# Patient Record
Sex: Male | Born: 1960 | Race: White | Hispanic: No | Smoking: Never smoker
Health system: Southern US, Community
[De-identification: ages and names within clinical notes are randomized; demographics above are authoritative.]

## PROBLEM LIST (undated history)

## (undated) DIAGNOSIS — M1990 Unspecified osteoarthritis, unspecified site: Secondary | ICD-10-CM

## (undated) DIAGNOSIS — I10 Essential (primary) hypertension: Secondary | ICD-10-CM

## (undated) DIAGNOSIS — E119 Type 2 diabetes mellitus without complications: Secondary | ICD-10-CM

## (undated) DIAGNOSIS — S2249XA Multiple fractures of ribs, unspecified side, initial encounter for closed fracture: Secondary | ICD-10-CM

## (undated) DIAGNOSIS — M25519 Pain in unspecified shoulder: Secondary | ICD-10-CM

## (undated) DIAGNOSIS — I2510 Atherosclerotic heart disease of native coronary artery without angina pectoris: Secondary | ICD-10-CM

## (undated) DIAGNOSIS — K219 Gastro-esophageal reflux disease without esophagitis: Secondary | ICD-10-CM

## (undated) DIAGNOSIS — R300 Dysuria: Secondary | ICD-10-CM

## (undated) DIAGNOSIS — B351 Tinea unguium: Secondary | ICD-10-CM

## (undated) DIAGNOSIS — F32A Depression, unspecified: Secondary | ICD-10-CM

## (undated) DIAGNOSIS — N529 Male erectile dysfunction, unspecified: Secondary | ICD-10-CM

## (undated) DIAGNOSIS — G4700 Insomnia, unspecified: Secondary | ICD-10-CM

## (undated) DIAGNOSIS — E7800 Pure hypercholesterolemia, unspecified: Secondary | ICD-10-CM

## (undated) DIAGNOSIS — S2220XA Unspecified fracture of sternum, initial encounter for closed fracture: Secondary | ICD-10-CM

## (undated) DIAGNOSIS — N200 Calculus of kidney: Secondary | ICD-10-CM

## (undated) DIAGNOSIS — Z8371 Family history of colonic polyps: Secondary | ICD-10-CM

## (undated) DIAGNOSIS — Z8781 Personal history of (healed) traumatic fracture: Secondary | ICD-10-CM

## (undated) DIAGNOSIS — R079 Chest pain, unspecified: Secondary | ICD-10-CM

## (undated) DIAGNOSIS — Z83719 Family history of colon polyps, unspecified: Secondary | ICD-10-CM

## (undated) DIAGNOSIS — C61 Malignant neoplasm of prostate: Secondary | ICD-10-CM

## (undated) DIAGNOSIS — C4491 Basal cell carcinoma of skin, unspecified: Secondary | ICD-10-CM

## (undated) DIAGNOSIS — R001 Bradycardia, unspecified: Secondary | ICD-10-CM

## (undated) DIAGNOSIS — E785 Hyperlipidemia, unspecified: Secondary | ICD-10-CM

## (undated) DIAGNOSIS — F329 Major depressive disorder, single episode, unspecified: Secondary | ICD-10-CM

## (undated) DIAGNOSIS — H269 Unspecified cataract: Secondary | ICD-10-CM

## (undated) DIAGNOSIS — K529 Noninfective gastroenteritis and colitis, unspecified: Secondary | ICD-10-CM

## (undated) DIAGNOSIS — I440 Atrioventricular block, first degree: Secondary | ICD-10-CM

## (undated) DIAGNOSIS — K760 Fatty (change of) liver, not elsewhere classified: Secondary | ICD-10-CM

## (undated) DIAGNOSIS — M109 Gout, unspecified: Secondary | ICD-10-CM

## (undated) DIAGNOSIS — G4733 Obstructive sleep apnea (adult) (pediatric): Secondary | ICD-10-CM

## (undated) HISTORY — DX: Unspecified cataract: H26.9

## (undated) HISTORY — DX: Male erectile dysfunction, unspecified: N52.9

## (undated) HISTORY — DX: Chest pain, unspecified: R07.9

## (undated) HISTORY — DX: Malignant neoplasm of prostate: C61

## (undated) HISTORY — DX: Basal cell carcinoma of skin, unspecified: C44.91

## (undated) HISTORY — DX: Pain in unspecified shoulder: M25.519

## (undated) HISTORY — DX: Family history of colon polyps, unspecified: Z83.719

## (undated) HISTORY — DX: Depression, unspecified: F32.A

## (undated) HISTORY — DX: Unspecified osteoarthritis, unspecified site: M19.90

## (undated) HISTORY — DX: Insomnia, unspecified: G47.00

## (undated) HISTORY — DX: Hyperlipidemia, unspecified: E78.5

## (undated) HISTORY — PX: WRIST FRACTURE SURGERY: SHX121

## (undated) HISTORY — DX: Calculus of kidney: N20.0

## (undated) HISTORY — DX: Essential (primary) hypertension: I10

## (undated) HISTORY — DX: Noninfective gastroenteritis and colitis, unspecified: K52.9

## (undated) HISTORY — PX: APPENDECTOMY: SHX54

## (undated) HISTORY — DX: Tinea unguium: B35.1

## (undated) HISTORY — DX: Gastro-esophageal reflux disease without esophagitis: K21.9

## (undated) HISTORY — PX: HAND SURGERY: SHX662

## (undated) HISTORY — DX: Type 2 diabetes mellitus without complications: E11.9

## (undated) HISTORY — DX: Dysuria: R30.0

## (undated) HISTORY — PX: PROSTATECTOMY: SHX69

## (undated) HISTORY — DX: Family history of colonic polyps: Z83.71

## (undated) HISTORY — PX: FRACTURE SURGERY: SHX138

## (undated) HISTORY — DX: Major depressive disorder, single episode, unspecified: F32.9

---

## 1998-12-17 ENCOUNTER — Emergency Department (HOSPITAL_COMMUNITY): Admission: EM | Admit: 1998-12-17 | Discharge: 1998-12-17 | Payer: Self-pay | Admitting: Emergency Medicine

## 1998-12-17 ENCOUNTER — Encounter: Payer: Self-pay | Admitting: Emergency Medicine

## 1999-12-17 ENCOUNTER — Emergency Department (HOSPITAL_COMMUNITY): Admission: EM | Admit: 1999-12-17 | Discharge: 1999-12-17 | Payer: Self-pay | Admitting: *Deleted

## 2000-07-19 ENCOUNTER — Emergency Department (HOSPITAL_COMMUNITY): Admission: EM | Admit: 2000-07-19 | Discharge: 2000-07-19 | Payer: Self-pay | Admitting: Emergency Medicine

## 2000-07-19 ENCOUNTER — Encounter: Payer: Self-pay | Admitting: Emergency Medicine

## 2002-12-13 ENCOUNTER — Ambulatory Visit (HOSPITAL_COMMUNITY): Admission: RE | Admit: 2002-12-13 | Discharge: 2002-12-13 | Payer: Self-pay | Admitting: Gastroenterology

## 2005-05-22 ENCOUNTER — Ambulatory Visit (HOSPITAL_COMMUNITY): Payer: Self-pay | Admitting: Psychiatry

## 2005-08-08 ENCOUNTER — Ambulatory Visit (HOSPITAL_COMMUNITY): Payer: Self-pay | Admitting: Psychology

## 2012-10-18 ENCOUNTER — Encounter: Payer: Self-pay | Admitting: Cardiology

## 2012-10-18 ENCOUNTER — Encounter: Payer: Self-pay | Admitting: *Deleted

## 2012-10-18 DIAGNOSIS — N529 Male erectile dysfunction, unspecified: Secondary | ICD-10-CM | POA: Insufficient documentation

## 2012-10-18 DIAGNOSIS — R300 Dysuria: Secondary | ICD-10-CM | POA: Insufficient documentation

## 2012-10-18 DIAGNOSIS — C61 Malignant neoplasm of prostate: Secondary | ICD-10-CM | POA: Insufficient documentation

## 2012-10-18 DIAGNOSIS — K529 Noninfective gastroenteritis and colitis, unspecified: Secondary | ICD-10-CM | POA: Insufficient documentation

## 2012-10-18 DIAGNOSIS — N200 Calculus of kidney: Secondary | ICD-10-CM | POA: Insufficient documentation

## 2012-10-18 DIAGNOSIS — F32A Depression, unspecified: Secondary | ICD-10-CM | POA: Insufficient documentation

## 2012-10-18 DIAGNOSIS — B351 Tinea unguium: Secondary | ICD-10-CM | POA: Insufficient documentation

## 2012-10-18 DIAGNOSIS — G4700 Insomnia, unspecified: Secondary | ICD-10-CM | POA: Insufficient documentation

## 2012-10-18 DIAGNOSIS — C4491 Basal cell carcinoma of skin, unspecified: Secondary | ICD-10-CM | POA: Insufficient documentation

## 2012-10-18 DIAGNOSIS — R072 Precordial pain: Secondary | ICD-10-CM | POA: Insufficient documentation

## 2012-10-18 DIAGNOSIS — R079 Chest pain, unspecified: Secondary | ICD-10-CM | POA: Insufficient documentation

## 2012-10-18 DIAGNOSIS — F329 Major depressive disorder, single episode, unspecified: Secondary | ICD-10-CM | POA: Insufficient documentation

## 2012-10-18 DIAGNOSIS — K219 Gastro-esophageal reflux disease without esophagitis: Secondary | ICD-10-CM | POA: Insufficient documentation

## 2012-10-18 DIAGNOSIS — E785 Hyperlipidemia, unspecified: Secondary | ICD-10-CM | POA: Insufficient documentation

## 2012-10-18 DIAGNOSIS — M25519 Pain in unspecified shoulder: Secondary | ICD-10-CM | POA: Insufficient documentation

## 2012-10-19 ENCOUNTER — Ambulatory Visit (INDEPENDENT_AMBULATORY_CARE_PROVIDER_SITE_OTHER): Payer: 59 | Admitting: Cardiology

## 2012-10-19 ENCOUNTER — Encounter: Payer: Self-pay | Admitting: Cardiology

## 2012-10-19 VITALS — BP 122/67 | HR 65 | Ht 70.0 in | Wt 240.4 lb

## 2012-10-19 DIAGNOSIS — R079 Chest pain, unspecified: Secondary | ICD-10-CM

## 2012-10-19 NOTE — Progress Notes (Signed)
Patient ID: Cheo Selvey, male   DOB: July 21, 1960, 51 y.o.   MRN: [id number]    Patient Name: David Hartman Date of Encounter: 10/19/2012  Primary Care Provider:  No primary provider on file. Primary Cardiologist:  Tobias Alexander, H  Patient Profile  Chest pain  Problem List   Past Medical History  Diagnosis Date  . Chest pain   . Shoulder pain   . Depression   . Erectile dysfunction   . GERD (gastroesophageal reflux disease)   . Hyperlipidemia   . HTN (hypertension)   . Insomnia   . Nephrolithiasis   . Prostate cancer   . Basal cell carcinoma   . Dysuria   . Gastroenteritis   . Insomnia   . Onychomycosis    Past Surgical History  Procedure Laterality Date  . Prostatectomy      Allergies  No Known Allergies  HPI  52 years old male with h/o HTN, HLP previously followed by Dr. Rudean Haskell. He has a complete negative work up for chest pains about 10 years ago including echocardiogram and a stress test that were all normal. The patient describes a recent episode of a back pain followed by a chest pain that was reproducible. It was a sharp pain that lasted for 2 days. NTG gave him minimal relief. He recalls moving furniture couple of days prior to this event. He went to the ED where they ruled him out for ACS. The patient is anxious as he has a significant family history positive for CAD, both his father and grandfather died of MI. Two years ago he was treated for a prostate cancer.  Home Medications  Prior to Admission medications   Medication Sig Start Date End Date Taking? Authorizing Provider  ALPRAZolam Prudy Feeler) 1 MG tablet Take by mouth. Take 1/2 tab by mouth as needed for sleep   Yes Historical Provider, MD  atenolol (TENORMIN) 25 MG tablet Take 25 mg by mouth daily.   Yes Historical Provider, MD  naproxen (NAPROSYN) 500 MG tablet Take 500 mg by mouth as needed.    Yes Historical Provider, MD  omeprazole (PRILOSEC OTC) 20 MG tablet Take 20 mg by mouth daily.   Yes  Historical Provider, MD  simvastatin (ZOCOR) 40 MG tablet Take 40 mg by mouth every evening.   Yes Historical Provider, MD  triamterene-hydrochlorothiazide (DYAZIDE) 37.5-25 MG per capsule Take 1 capsule by mouth every morning.   Yes Historical Provider, MD    Family History  Father, grandfather - MI  Social History  History   Social History  . Marital Status: Divorced    Spouse Name: N/A    Number of Children: N/A  . Years of Education: N/A   Occupational History  . Not on file.   Social History Main Topics  . Smoking status: Never Smoker   . Smokeless tobacco: Not on file  . Alcohol Use: Not on file     Comment: Couple times a month  . Drug Use: No  . Sexual Activity: Not on file   Other Topics Concern  . Not on file   Social History Narrative  . No narrative on file     Review of Systems General:  No chills, fever, night sweats or weight changes.  Cardiovascular:  + chest pain, dyspnea on exertion, edema, orthopnea, palpitations, paroxysmal nocturnal dyspnea. Dermatological: No rash, lesions/masses Respiratory: No cough, dyspnea Urologic: No hematuria, dysuria Abdominal:   No nausea, vomiting, diarrhea, bright red blood per rectum, melena, or hematemesis Neurologic:  No visual changes, wkns, changes in mental status. All other systems reviewed and are otherwise negative except as noted above.  Physical Exam  Blood pressure 122/67, pulse 65, height 5\' 10"  (1.778 m), weight 240 lb 6.4 oz (109.045 kg).  General: Pleasant, NAD Psych: Normal affect. Neuro: Alert and oriented X 3. Moves all extremities spontaneously. HEENT: Normal  Neck: Supple without bruits or JVD. Lungs:  Resp regular and unlabored, CTA. Heart: RRR no s3, s4, or murmurs. Abdomen: Soft, non-tender, non-distended, BS + x 4.  Extremities: No clubbing, cyanosis or edema. DP/PT/Radials 2+ and equal bilaterally.  Accessory Clinical Findings  ECG - SR, 65 BPM, normal ECG   Assessment &  Plan  52 year old male   1. Chest pain - rather atypical, musculoskeletal, reproducible, however considering his age, risk factor including HTN, HLP and significant family history we will order an exercise stress echocardiogram to assess his baselien wall motion abnormality and possible ischemia.  2. Hypertension - well controlled  3. Hyperlipidemia - recently checked by his PCP,  The results are not available, the patient will fax [place] the results for review  We will cal the patient with the results  Tobias Alexander, Rexene Edison, MD 10/19/2012, 4:34 PM

## 2012-10-19 NOTE — Patient Instructions (Addendum)
**Note De-Identified Bailei Buist Obfuscation** Your physician has requested that you have a stress echocardiogram. For further information please visit [email or web address] Please follow instruction sheet as given.  Your physician has requested that you have an echocardiogram. Echocardiography is a painless test that uses sound waves to create images of your heart. It provides your doctor with information about the size and shape of your heart and how well your heart's chambers and valves are working. This procedure takes approximately one hour. There are no restrictions for this procedure.  Your physician recommends that you schedule a follow-up appointment in: 3 weeks

## 2012-11-10 ENCOUNTER — Other Ambulatory Visit (HOSPITAL_COMMUNITY): Payer: 59

## 2012-11-10 ENCOUNTER — Ambulatory Visit (HOSPITAL_BASED_OUTPATIENT_CLINIC_OR_DEPARTMENT_OTHER): Payer: 59

## 2012-11-10 ENCOUNTER — Ambulatory Visit (HOSPITAL_BASED_OUTPATIENT_CLINIC_OR_DEPARTMENT_OTHER): Payer: 59 | Admitting: Cardiology

## 2012-11-10 ENCOUNTER — Ambulatory Visit (HOSPITAL_COMMUNITY): Payer: 59 | Attending: Cardiology

## 2012-11-10 ENCOUNTER — Other Ambulatory Visit (HOSPITAL_COMMUNITY): Payer: Self-pay | Admitting: Cardiology

## 2012-11-10 DIAGNOSIS — R072 Precordial pain: Secondary | ICD-10-CM | POA: Insufficient documentation

## 2012-11-10 DIAGNOSIS — Z8249 Family history of ischemic heart disease and other diseases of the circulatory system: Secondary | ICD-10-CM | POA: Insufficient documentation

## 2012-11-10 DIAGNOSIS — C61 Malignant neoplasm of prostate: Secondary | ICD-10-CM | POA: Insufficient documentation

## 2012-11-10 DIAGNOSIS — R079 Chest pain, unspecified: Secondary | ICD-10-CM

## 2012-11-10 DIAGNOSIS — E785 Hyperlipidemia, unspecified: Secondary | ICD-10-CM | POA: Insufficient documentation

## 2012-11-10 DIAGNOSIS — R0989 Other specified symptoms and signs involving the circulatory and respiratory systems: Secondary | ICD-10-CM

## 2012-11-10 DIAGNOSIS — I10 Essential (primary) hypertension: Secondary | ICD-10-CM | POA: Insufficient documentation

## 2012-11-10 MED ORDER — PERFLUTREN PROTEIN A MICROSPH IV SUSP
2.0000 mL | Freq: Once | INTRAVENOUS | Status: AC
  Administered 2012-11-10: 2 mL via INTRAVENOUS

## 2012-11-10 NOTE — Progress Notes (Signed)
Stress echocardiogram performed. 

## 2012-11-10 NOTE — Progress Notes (Signed)
Echo performed. 

## 2012-11-12 ENCOUNTER — Ambulatory Visit: Payer: 59 | Admitting: Cardiology

## 2012-11-12 ENCOUNTER — Encounter: Payer: Self-pay | Admitting: Cardiology

## 2012-11-15 ENCOUNTER — Telehealth: Payer: Self-pay

## 2012-11-15 NOTE — Telephone Encounter (Signed)
New problem     Patient returning call back

## 2012-11-15 NOTE — Telephone Encounter (Signed)
Pt given results of his Echo and Stress Echo, he verbalized understanding.

## 2013-01-31 ENCOUNTER — Emergency Department (HOSPITAL_COMMUNITY)
Admission: EM | Admit: 2013-01-31 | Discharge: 2013-01-31 | Disposition: A | Discharge status: Home / Self Care | Payer: 59 | Attending: Emergency Medicine | Admitting: Emergency Medicine

## 2013-01-31 ENCOUNTER — Encounter (HOSPITAL_COMMUNITY): Payer: Self-pay | Admitting: Emergency Medicine

## 2013-01-31 DIAGNOSIS — Z8619 Personal history of other infectious and parasitic diseases: Secondary | ICD-10-CM | POA: Insufficient documentation

## 2013-01-31 DIAGNOSIS — F329 Major depressive disorder, single episode, unspecified: Secondary | ICD-10-CM | POA: Insufficient documentation

## 2013-01-31 DIAGNOSIS — Z87442 Personal history of urinary calculi: Secondary | ICD-10-CM | POA: Insufficient documentation

## 2013-01-31 DIAGNOSIS — K219 Gastro-esophageal reflux disease without esophagitis: Secondary | ICD-10-CM | POA: Insufficient documentation

## 2013-01-31 DIAGNOSIS — Z87448 Personal history of other diseases of urinary system: Secondary | ICD-10-CM | POA: Insufficient documentation

## 2013-01-31 DIAGNOSIS — M109 Gout, unspecified: Secondary | ICD-10-CM

## 2013-01-31 DIAGNOSIS — Z85828 Personal history of other malignant neoplasm of skin: Secondary | ICD-10-CM | POA: Insufficient documentation

## 2013-01-31 DIAGNOSIS — E785 Hyperlipidemia, unspecified: Secondary | ICD-10-CM | POA: Insufficient documentation

## 2013-01-31 DIAGNOSIS — M1A9XX1 Chronic gout, unspecified, with tophus (tophi): Secondary | ICD-10-CM | POA: Insufficient documentation

## 2013-01-31 DIAGNOSIS — F3289 Other specified depressive episodes: Secondary | ICD-10-CM | POA: Insufficient documentation

## 2013-01-31 DIAGNOSIS — I10 Essential (primary) hypertension: Secondary | ICD-10-CM | POA: Insufficient documentation

## 2013-01-31 DIAGNOSIS — Z79899 Other long term (current) drug therapy: Secondary | ICD-10-CM | POA: Insufficient documentation

## 2013-01-31 DIAGNOSIS — Z8546 Personal history of malignant neoplasm of prostate: Secondary | ICD-10-CM | POA: Insufficient documentation

## 2013-01-31 DIAGNOSIS — IMO0002 Reserved for concepts with insufficient information to code with codable children: Secondary | ICD-10-CM | POA: Insufficient documentation

## 2013-01-31 DIAGNOSIS — Z792 Long term (current) use of antibiotics: Secondary | ICD-10-CM | POA: Insufficient documentation

## 2013-01-31 LAB — BASIC METABOLIC PANEL
BUN: 19 mg/dL (ref 6–23)
CALCIUM: 9.2 mg/dL (ref 8.4–10.5)
CO2: 25 meq/L (ref 19–32)
CREATININE: 0.96 mg/dL (ref 0.50–1.35)
Chloride: 100 mEq/L (ref 96–112)
GFR calc non Af Amer: >90 mL/min (ref >90)
Glucose, Bld: 96 mg/dL (ref 70–99)
Potassium: 3.9 mEq/L (ref 3.7–5.3)
Sodium: 139 mEq/L (ref 137–147)

## 2013-01-31 LAB — URIC ACID: Uric Acid, Serum: 6.4 mg/dL (ref 4.0–7.8)

## 2013-01-31 MED ORDER — MORPHINE SULFATE 4 MG/ML IJ SOLN: 4.0000 mg | INTRAMUSCULAR | Status: DC | PRN

## 2013-01-31 MED ORDER — KETOROLAC TROMETHAMINE 30 MG/ML IJ SOLN
30.0000 mg | Freq: Once | INTRAMUSCULAR | Status: AC
  Administered 2013-01-31: 30 mg via INTRAVENOUS
  Filled 2013-01-31: qty 1

## 2013-01-31 MED ORDER — ONDANSETRON HCL 4 MG/2ML IJ SOLN
4.0000 mg | Freq: Once | INTRAMUSCULAR | Status: AC
Start: 2013-01-31 — End: 2013-01-31
  Administered 2013-01-31: 4 mg via INTRAVENOUS
  Filled 2013-01-31: qty 2

## 2013-01-31 MED ORDER — MORPHINE SULFATE 4 MG/ML IJ SOLN
4.0000 mg | INTRAMUSCULAR | Status: DC | PRN
  Administered 2013-01-31: 4 mg via INTRAVENOUS
  Filled 2013-01-31: qty 1

## 2013-01-31 MED ORDER — COLCHICINE 0.6 MG PO TABS
0.6000 mg | ORAL_TABLET | Freq: Once | ORAL | Status: AC
  Administered 2013-01-31: 0.6 mg via ORAL
  Filled 2013-01-31: qty 1

## 2013-01-31 MED ORDER — COLCHICINE 0.6 MG PO TABS: 0.6000 mg | ORAL_TABLET | Freq: Every day | ORAL | Status: DC

## 2013-01-31 MED ORDER — DEXAMETHASONE SODIUM PHOSPHATE 10 MG/ML IJ SOLN
10.0000 mg | Freq: Once | INTRAMUSCULAR | Status: AC
  Administered 2013-01-31: 10 mg via INTRAVENOUS
  Filled 2013-01-31: qty 1

## 2013-01-31 MED ORDER — PREDNISONE 10 MG PO TABS: 20.0000 mg | ORAL_TABLET | Freq: Every day | ORAL | Status: DC

## 2013-01-31 MED ORDER — OXYCODONE-ACETAMINOPHEN 10-325 MG PO TABS: 1.0000 | ORAL_TABLET | ORAL | Status: DC | PRN

## 2013-01-31 NOTE — ED Provider Notes (Signed)
CSN: [id number]     Arrival date & time 01/31/13  1032 History   First MD Initiated Contact with Patient 01/31/13 1240     Chief Complaint  Patient presents with  . Leg Swelling    HPI  [place] with painful swollen joints. 2 days ago he had some pain in his right hand and wrist. Painful to the point that it was uncomfortable to even move it. Seen at urgent care yesterday. Had labs obtained. Was given indomethacin, and Vicodin. States his pain has progressed despite this. He presents here. Symptoms have since involved to involve his left foot, and left knee. He has had a history of a knee effusion in the past and drained. This was after an injury. No other episodes of monoarthritis, or migratory arthritis. His father suffered with gout. He has never been diagnosed. He has small "bumps or swelling". The back of his elbow for several years.  No fevers. He has not fell ill. No nausea or vomiting. No rash. No urethritis or other infections.  Past Medical History  Diagnosis Date  . Chest pain   . Shoulder pain   . Depression   . Erectile dysfunction   . GERD (gastroesophageal reflux disease)   . Hyperlipidemia   . HTN (hypertension)   . Insomnia   . Nephrolithiasis   . Prostate cancer   . Basal cell carcinoma   . Dysuria   . Gastroenteritis   . Insomnia   . Onychomycosis    Past Surgical History  Procedure Laterality Date  . Prostatectomy     No family history on file. History  Substance Use Topics  . Smoking status: Never Smoker   . Smokeless tobacco: Not on file  . Alcohol Use: Not on file     Comment: "a beer once in a while"     Review of Systems  Constitutional: Negative for fever, chills, diaphoresis, appetite change and fatigue.  HENT: Negative for mouth sores, sore throat and trouble swallowing.   Eyes: Negative for visual disturbance.  Respiratory: Negative for cough, chest tightness, shortness of breath and wheezing.   Cardiovascular: Negative for chest pain.   Gastrointestinal: Negative for nausea, vomiting, abdominal pain, diarrhea and abdominal distention.  Endocrine: Negative for polydipsia, polyphagia and polyuria.  Genitourinary: Negative for dysuria, frequency and hematuria.  Musculoskeletal: Positive for arthralgias and joint swelling. Negative for gait problem.  Skin: Negative for color change, pallor and rash.  Neurological: Negative for dizziness, syncope, light-headedness and headaches.  Hematological: Does not bruise/bleed easily.  Psychiatric/Behavioral: Negative for behavioral problems and confusion.    Allergies  Review of patient's allergies indicates no known allergies.  Home Medications   Current Outpatient Rx  Name  Route  Sig  Dispense  Refill  . ALPRAZolam (XANAX) 1 MG tablet   Oral   Take 1 mg by mouth at bedtime as needed for sleep.          Marland Kitchen amoxicillin-clavulanate (AUGMENTIN) 875-125 MG per tablet   Oral   Take 1 tablet by mouth 2 (two) times daily.         Marland Kitchen atenolol (TENORMIN) 25 MG tablet   Oral   Take 25 mg by mouth daily.         . cyclobenzaprine (FLEXERIL) 10 MG tablet   Oral   Take 1 tablet by mouth 3 (three) times daily as needed for muscle spasms.          Marland Kitchen HYDROcodone-acetaminophen (NORCO/VICODIN) 5-325 MG per tablet  Oral   Take 1 tablet by mouth every 6 (six) hours as needed for moderate pain.          . indomethacin (INDOCIN) 50 MG capsule   Oral   Take 1 capsule by mouth 2 (two) times daily.         . naproxen (NAPROSYN) 500 MG tablet   Oral   Take 500 mg by mouth 2 (two) times daily as needed (knee pain).          Marland Kitchen omeprazole (PRILOSEC OTC) 20 MG tablet   Oral   Take 20 mg by mouth daily.         . simvastatin (ZOCOR) 40 MG tablet   Oral   Take 40 mg by mouth every evening.         . triamterene-hydrochlorothiazide (DYAZIDE) 37.5-25 MG per capsule   Oral   Take 1 capsule by mouth every morning.         . colchicine 0.6 MG tablet   Oral   Take 1  tablet (0.6 mg total) by mouth daily.   20 tablet   1   . oxyCODONE-acetaminophen (PERCOCET) 10-325 MG per tablet   Oral   Take 1 tablet by mouth every 4 (four) hours as needed for pain.   30 tablet   0   . predniSONE (DELTASONE) 10 MG tablet   Oral   Take 2 tablets (20 mg total) by mouth daily.   10 tablet   0    BP 125/65  Pulse 72  Temp(Src) 98 F (36.7 C) (Oral)  Resp 16  Ht 5\' 10"  (1.778 m)  Wt 237 lb (107.502 kg)  BMI 34.01 kg/m2  SpO2 97% Physical Exam  Constitutional: He is oriented to person, place, and time. He appears well-developed and well-nourished. No distress.  HENT:  Head: Normocephalic.  Eyes: Conjunctivae are normal. Pupils are equal, round, and reactive to light. No scleral icterus.  Neck: Normal range of motion. Neck supple. No thyromegaly present.  Cardiovascular: Normal rate and regular rhythm.  Exam reveals no gallop and no friction rub.   No murmur heard. Pulmonary/Chest: Effort normal and breath sounds normal. No respiratory distress. He has no wheezes. He has no rales.  Abdominal: Soft. Bowel sounds are normal. He exhibits no distension. There is no tenderness. There is no rebound.  Musculoskeletal: Normal range of motion.  He is soft tissue swelling of the dorsum of his wrist and hand. Both are painful to range of motion. Good perfusion distally. No pain at the elbow. 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 His left knee is painful but has not had effusion. He has soft tissue swelling to the dorsum of the left foot. No erythema. No warmth. No ankle effusion.  Neurological: He is alert and oriented to person, place, and time.  Skin: Skin is warm and dry. No rash noted.  No rash or petechiae  Psychiatric: He has a normal mood and affect. His behavior is normal.    ED Course  Procedures (including critical care time) Labs Review Labs Reviewed  BASIC METABOLIC PANEL  URIC ACID   Imaging Review No  results found.  EKG Interpretation   None       MDM   1. Gout    Visual the discharged on anti-inflammatories, steroids, colchicine, pain medications.    Tanna Furry, MD 02/02/13 [phone]

## 2013-01-31 NOTE — Discharge Instructions (Signed)
Stop hydrochlorothiazide. Talk with your Dr. Regarding any substitute medications. Talk to your doctor about allopurinol to your episode is resolved.  Gout Gout is when your joints become red, sore, and swell (inflammed). This is caused by the buildup of uric acid crystals in the joints. Uric acid is a chemical that is normally in the blood. If the level of uric acid gets too high in the blood, these crystals form in your joints and tissues. Over time, these crystals can form into masses near the joints and tissues. These masses can destroy bone and cause the bone to look misshapen (deformed). HOME CARE   Do not take aspirin for pain.  Only take medicine as told by your doctor.  Rest the joint as much as you can. When in bed, keep sheets and blankets off painful areas.  Keep the sore joints raised (elevated).  Put warm or cold packs on painful joints. Use of warm or cold packs depends on which works best for you.  Use crutches if the painful joint is in your leg.  Drink enough fluids to keep your pee (urine) clear or pale yellow. Limit alcohol, sugary drinks, and drinks with fructose in them.  Follow your diet instructions. Pay careful attention to how much protein you eat. Include fruits, vegetables, whole grains, and fat-free or low-fat milk products in your daily diet. Talk to your doctor or dietician about the use of coffee, vitamin C, and cherries. These may help lower uric acid levels.  Keep a healthy body weight. GET HELP RIGHT AWAY IF:   You have watery poop (diarrhea), throw up (vomit), or have any side effects from medicines.  You do not feel better in 24 hours, or you are getting worse.  Your joint becomes suddenly more tender, and you have chills or a fever. MAKE SURE YOU:   Understand these instructions.  Will watch your condition.  Will get help right away if you are not doing well or get worse. Document Released: 10/16/2007 Document Revised: 05/03/2012 Document  Reviewed: 04/16/2009 [facility] Patient Information 2014 [place].

## 2013-01-31 NOTE — ED Notes (Signed)
Pt with left foot and left knee pain since Friday, Thursday with right hand swelling and pain, seen Urgent Care and dx with Gout yesterday and was given prescription for Norco and gout med, pt states this morning woke with with right elbow pain as well, states Norco does not touch his pain and has taken three doses of gout med

## 2013-01-31 NOTE — ED Notes (Signed)
Pt states left foot was swollen yesterday and was seen yesterday at urgent care. No previous hx of gout. Was placed on gout medication and blood was drawn but no results as of yet. Woke up this morning with left knee also swollen, along with right elbow. Pt refused wheelchair on arrival but had difficulty ambulating.

## 2013-01-31 NOTE — ED Notes (Signed)
Good pedal pulses and radial pulses noted bilaterally

## 2013-06-01 ENCOUNTER — Encounter (HOSPITAL_COMMUNITY): Payer: Self-pay | Admitting: Emergency Medicine

## 2013-06-01 DIAGNOSIS — Z8619 Personal history of other infectious and parasitic diseases: Secondary | ICD-10-CM | POA: Insufficient documentation

## 2013-06-01 DIAGNOSIS — K219 Gastro-esophageal reflux disease without esophagitis: Secondary | ICD-10-CM | POA: Insufficient documentation

## 2013-06-01 DIAGNOSIS — I10 Essential (primary) hypertension: Secondary | ICD-10-CM | POA: Insufficient documentation

## 2013-06-01 DIAGNOSIS — E785 Hyperlipidemia, unspecified: Secondary | ICD-10-CM | POA: Insufficient documentation

## 2013-06-01 DIAGNOSIS — Z79899 Other long term (current) drug therapy: Secondary | ICD-10-CM | POA: Insufficient documentation

## 2013-06-01 DIAGNOSIS — N200 Calculus of kidney: Secondary | ICD-10-CM | POA: Insufficient documentation

## 2013-06-01 DIAGNOSIS — IMO0002 Reserved for concepts with insufficient information to code with codable children: Secondary | ICD-10-CM | POA: Insufficient documentation

## 2013-06-01 DIAGNOSIS — F329 Major depressive disorder, single episode, unspecified: Secondary | ICD-10-CM | POA: Insufficient documentation

## 2013-06-01 DIAGNOSIS — R42 Dizziness and giddiness: Secondary | ICD-10-CM | POA: Insufficient documentation

## 2013-06-01 DIAGNOSIS — F3289 Other specified depressive episodes: Secondary | ICD-10-CM | POA: Insufficient documentation

## 2013-06-01 DIAGNOSIS — Z8546 Personal history of malignant neoplasm of prostate: Secondary | ICD-10-CM | POA: Insufficient documentation

## 2013-06-01 DIAGNOSIS — Z791 Long term (current) use of non-steroidal anti-inflammatories (NSAID): Secondary | ICD-10-CM | POA: Insufficient documentation

## 2013-06-01 NOTE — ED Notes (Signed)
Pain in left flank and into left groin for several hours, nausea, no vomiting.  Hx of kidney stones

## 2013-06-02 ENCOUNTER — Emergency Department (HOSPITAL_COMMUNITY): Payer: 59

## 2013-06-02 ENCOUNTER — Emergency Department (HOSPITAL_COMMUNITY)
Admission: EM | Admit: 2013-06-02 | Discharge: 2013-06-02 | Disposition: A | Discharge status: Home / Self Care | Payer: 59 | Attending: Emergency Medicine | Admitting: Emergency Medicine

## 2013-06-02 DIAGNOSIS — N200 Calculus of kidney: Secondary | ICD-10-CM

## 2013-06-02 LAB — URINE MICROSCOPIC-ADD ON

## 2013-06-02 LAB — URINALYSIS, ROUTINE W REFLEX MICROSCOPIC
BILIRUBIN URINE: NEGATIVE
Glucose, UA: NEGATIVE mg/dL
KETONES UR: NEGATIVE mg/dL
Leukocytes, UA: NEGATIVE
NITRITE: NEGATIVE
PH: 6 (ref 5.0–8.0)
Protein, ur: NEGATIVE mg/dL
Specific Gravity, Urine: <1.005 — ABNORMAL LOW (ref 1.005–1.030)
Urobilinogen, UA: 0.2 mg/dL (ref 0.0–1.0)

## 2013-06-02 MED ORDER — OXYCODONE-ACETAMINOPHEN 5-325 MG PO TABS: 2.0000 | ORAL_TABLET | ORAL | Status: DC | PRN

## 2013-06-02 MED ORDER — SODIUM CHLORIDE 0.9 % IV BOLUS (SEPSIS)
500.0000 mL | Freq: Once | INTRAVENOUS | Status: AC
  Administered 2013-06-02: 500 mL via INTRAVENOUS

## 2013-06-02 MED ORDER — KETOROLAC TROMETHAMINE 30 MG/ML IJ SOLN
30.0000 mg | Freq: Once | INTRAMUSCULAR | Status: AC
  Administered 2013-06-02: 30 mg via INTRAVENOUS
  Filled 2013-06-02: qty 1

## 2013-06-02 MED ORDER — ONDANSETRON HCL 4 MG/2ML IJ SOLN
4.0000 mg | Freq: Once | INTRAMUSCULAR | Status: AC
  Administered 2013-06-02: 4 mg via INTRAVENOUS
  Filled 2013-06-02: qty 2

## 2013-06-02 MED ORDER — HYDROMORPHONE HCL PF 1 MG/ML IJ SOLN
1.0000 mg | Freq: Once | INTRAMUSCULAR | Status: AC
  Administered 2013-06-02: 1 mg via INTRAVENOUS
  Filled 2013-06-02: qty 1

## 2013-06-02 MED ORDER — PROMETHAZINE HCL 25 MG PO TABS: 25.0000 mg | ORAL_TABLET | Freq: Four times a day (QID) | ORAL | Status: DC | PRN

## 2013-06-02 NOTE — Discharge Instructions (Signed)
Kidney Stones Kidney stones (urolithiasis) are solid masses that form inside your kidneys. The intense pain is caused by the stone moving through the kidney, ureter, bladder, and urethra (urinary tract). When the stone moves, the ureter starts to spasm around the stone. The stone is usually passed in your pee (urine).  HOME CARE  Drink enough fluids to keep your pee clear or pale yellow. This helps to get the stone out.  Strain all pee through the provided strainer. Do not pee without peeing through the strainer, not even once. If you pee the stone out, catch it in the strainer. The stone may be as small as a grain of salt. Take this to your doctor. This will help your doctor figure out what you can do to try to prevent more kidney stones.  Only take medicine as told by your doctor.  Follow up with your doctor as told.  Get follow-up X-rays as told by your doctor. GET HELP IF: You have pain that gets worse even if you have been taking pain medicine. GET HELP RIGHT AWAY IF:   Your pain does not get better with medicine.  You have a fever or shaking chills.  Your pain increases and gets worse over 18 hours.  You have new belly (abdominal) pain.  You feel faint or pass out.  You are unable to pee. MAKE SURE YOU:   Understand these instructions.  Will watch your condition.  Will get help right away if you are not doing well or get worse. Document Released: 06/25/2007 Document Revised: 09/08/2012 Document Reviewed: 06/09/2012 [facility] Patient Information 2014 [place], [place].   Medication for pain and nausea. Followup with urologist if not getting better.

## 2013-06-02 NOTE — ED Provider Notes (Signed)
CSN: [id number]     Arrival date & time 06/01/13  2326 History  This chart was scribed for Nat Christen, MD by Ladene Artist, ED Scribe. The patient was seen in room APA10/APA10. Patient's care was started at 12:33 AM.    Chief Complaint  Patient presents with  . Flank Pain    The history is provided by the patient. No language interpreter was used.   HPI Comments: David Hartman is a 53 y.o. male who presents to the Emergency Department complaining of sudden sharp L lateral flank pain that radiates to L groin onset 4 PM today. He reports light-headedness. He denies back pain, chest pain, dysuria, fever, hematuria. Pt has a h/o kidney stones. Pt has has approximately 9 kidney stone episodes in the past. Severity is moderate.  He is in remission for prostate cancer.   PCP:  Saunders Glance  Past Medical History  Diagnosis Date  . Chest pain   . Shoulder pain   . Depression   . Erectile dysfunction   . GERD (gastroesophageal reflux disease)   . Hyperlipidemia   . HTN (hypertension)   . Insomnia   . Nephrolithiasis   . Prostate cancer   . Basal cell carcinoma   . Dysuria   . Gastroenteritis   . Insomnia   . Onychomycosis    Past Surgical History  Procedure Laterality Date  . Prostatectomy     No family history on file. History  Substance Use Topics  . Smoking status: Never Smoker   . Smokeless tobacco: Not on file  . Alcohol Use: No     Comment: "a beer once in a while"     Review of Systems  Cardiovascular: Negative for chest pain.  Genitourinary: Positive for flank pain.  Musculoskeletal: Negative for back pain.  Neurological: Positive for light-headedness.  All other systems reviewed and are negative.   Allergies  Review of patient's allergies indicates no known allergies.  Home Medications   Prior to Admission medications   Medication Sig Start Date End Date Taking? Authorizing Provider  ALPRAZolam Duanne Moron) 1 MG tablet Take 1 mg by mouth at bedtime as needed  for sleep.    Yes Historical Provider, MD  atenolol (TENORMIN) 25 MG tablet Take 25 mg by mouth daily.   Yes Historical Provider, MD  omeprazole (PRILOSEC OTC) 20 MG tablet Take 20 mg by mouth daily.   Yes Historical Provider, MD  simvastatin (ZOCOR) 40 MG tablet Take 40 mg by mouth every evening.   Yes Historical Provider, MD  amoxicillin-clavulanate (AUGMENTIN) 875-125 MG per tablet Take 1 tablet by mouth 2 (two) times daily. 01/22/13   Historical Provider, MD  colchicine 0.6 MG tablet Take 1 tablet (0.6 mg total) by mouth daily. 01/31/13   Tanna Furry, MD  cyclobenzaprine (FLEXERIL) 10 MG tablet Take 1 tablet by mouth 3 (three) times daily as needed for muscle spasms.  01/28/13   Historical Provider, MD  HYDROcodone-acetaminophen (NORCO/VICODIN) 5-325 MG per tablet Take 1 tablet by mouth every 6 (six) hours as needed for moderate pain.  01/30/13   Historical Provider, MD  indomethacin (INDOCIN) 50 MG capsule Take 1 capsule by mouth 2 (two) times daily. 01/30/13   Historical Provider, MD  naproxen (NAPROSYN) 500 MG tablet Take 500 mg by mouth 2 (two) times daily as needed (knee pain).     Historical Provider, MD  oxyCODONE-acetaminophen (PERCOCET) 10-325 MG per tablet Take 1 tablet by mouth every 4 (four) hours as needed for pain. 01/31/13  Tanna Furry, MD  predniSONE (DELTASONE) 10 MG tablet Take 2 tablets (20 mg total) by mouth daily. 01/31/13   Tanna Furry, MD  triamterene-hydrochlorothiazide (DYAZIDE) 37.5-25 MG per capsule Take 1 capsule by mouth every morning.    Historical Provider, MD   Triage Vitals: BP 163/93  Pulse 79  Temp(Src) 97.9 F (36.6 C) (Oral)  Resp 16  Ht 5\' 10"  (1.778 m)  Wt 238 lb (107.956 kg)  BMI 34.15 kg/m2  SpO2 100% Physical Exam  Nursing note and vitals reviewed. Constitutional: He is oriented to person, place, and time. He appears well-developed and well-nourished.  HENT:  Head: Normocephalic and atraumatic.  Eyes: Conjunctivae and EOM are normal. Pupils are equal,  round, and reactive to light.  Neck: Normal range of motion. Neck supple.  Cardiovascular: Normal rate, regular rhythm and normal heart sounds.   Pulmonary/Chest: Effort normal and breath sounds normal.  Abdominal: Soft. Bowel sounds are normal.  Musculoskeletal: Normal range of motion.  L lateral flank minimal tenderness  LLQ tenderness   Neurological: He is alert and oriented to person, place, and time.  Skin: Skin is warm and dry.  Psychiatric: He has a normal mood and affect. His behavior is normal.    ED Course  Procedures (including critical care time) DIAGNOSTIC STUDIES: Oxygen Saturation is 100% on RA, normal by my interpretation.    COORDINATION OF CARE: 12:38 AM Discussed treatment plan with pt at bedside which includes UA and KUB and pt agreed to plan.  Labs Review Labs Reviewed  URINALYSIS, ROUTINE W REFLEX MICROSCOPIC - Abnormal; Notable for the following:    Specific Gravity, Urine <1.005 (*)    Hgb urine dipstick MODERATE (*)    All other components within normal limits  URINE MICROSCOPIC-ADD ON    Imaging Review Dg Abd 1 View  06/02/2013   CLINICAL DATA:  Lower quadrant pain  EXAM: ABDOMEN - 1 VIEW  COMPARISON:  None.  FINDINGS: The bowel gas pattern is nonspecific without evidence of obstruction or ileus. No abnormal bowel wall thickening identified. No free intraperitoneal air. No soft tissue mass. A 5 mm calcific density overlying the left renal shadow is suspicious for a left renal calculus. No other abnormal calcifications identified.  Mild degenerative changes noted within the visualized spine. No acute osseous abnormality.  IMPRESSION: 1. Nonobstructive bowel gas pattern with no radiographic evidence of acute intra-abdominal process. 2. 5 mm calcification overlying the left renal shadow, suspicious for a left renal calculus.   Electronically Signed   By: Jeannine Boga M.D.   On: 06/02/2013 01:17     EKG Interpretation None      MDM   Final  diagnoses:  Kidney stone on left side    History and physical consistent with left-sided kidney stone. Patient is pain-free after pain management. Urinalysis shows hemoglobin. Plain x-ray of abdomen does not show any obvious ureteral stone. Discharge medications Percocet and Phenergan 25 mg. Patient has a urologist  I personally performed the services described in this documentation, which was scribed in my presence. The recorded information has been reviewed and is accurate.    Nat Christen, MD 06/02/13 [phone]

## 2013-09-18 ENCOUNTER — Emergency Department (HOSPITAL_COMMUNITY)
Admission: EM | Admit: 2013-09-18 | Discharge: 2013-09-18 | Disposition: A | Discharge status: Home / Self Care | Payer: 59 | Attending: Emergency Medicine | Admitting: Emergency Medicine

## 2013-09-18 ENCOUNTER — Encounter (HOSPITAL_COMMUNITY): Payer: Self-pay | Admitting: Emergency Medicine

## 2013-09-18 ENCOUNTER — Emergency Department (HOSPITAL_COMMUNITY): Payer: 59

## 2013-09-18 DIAGNOSIS — Z8619 Personal history of other infectious and parasitic diseases: Secondary | ICD-10-CM | POA: Diagnosis not present

## 2013-09-18 DIAGNOSIS — E785 Hyperlipidemia, unspecified: Secondary | ICD-10-CM | POA: Diagnosis not present

## 2013-09-18 DIAGNOSIS — Z85828 Personal history of other malignant neoplasm of skin: Secondary | ICD-10-CM | POA: Insufficient documentation

## 2013-09-18 DIAGNOSIS — I10 Essential (primary) hypertension: Secondary | ICD-10-CM | POA: Insufficient documentation

## 2013-09-18 DIAGNOSIS — Z79899 Other long term (current) drug therapy: Secondary | ICD-10-CM | POA: Insufficient documentation

## 2013-09-18 DIAGNOSIS — R109 Unspecified abdominal pain: Secondary | ICD-10-CM | POA: Insufficient documentation

## 2013-09-18 DIAGNOSIS — Z8546 Personal history of malignant neoplasm of prostate: Secondary | ICD-10-CM | POA: Insufficient documentation

## 2013-09-18 DIAGNOSIS — K219 Gastro-esophageal reflux disease without esophagitis: Secondary | ICD-10-CM | POA: Diagnosis not present

## 2013-09-18 DIAGNOSIS — F3289 Other specified depressive episodes: Secondary | ICD-10-CM | POA: Insufficient documentation

## 2013-09-18 DIAGNOSIS — N23 Unspecified renal colic: Secondary | ICD-10-CM | POA: Diagnosis not present

## 2013-09-18 DIAGNOSIS — F329 Major depressive disorder, single episode, unspecified: Secondary | ICD-10-CM | POA: Diagnosis not present

## 2013-09-18 DIAGNOSIS — Z9089 Acquired absence of other organs: Secondary | ICD-10-CM | POA: Insufficient documentation

## 2013-09-18 LAB — COMPREHENSIVE METABOLIC PANEL
ALK PHOS: 76 U/L (ref 39–117)
ALT: 25 U/L (ref 0–53)
ANION GAP: 12 (ref 5–15)
AST: 23 U/L (ref 0–37)
Albumin: 4.2 g/dL (ref 3.5–5.2)
BILIRUBIN TOTAL: 0.4 mg/dL (ref 0.3–1.2)
BUN: 20 mg/dL (ref 6–23)
CHLORIDE: 100 meq/L (ref 96–112)
CO2: 25 mEq/L (ref 19–32)
Calcium: 9.4 mg/dL (ref 8.4–10.5)
Creatinine, Ser: 1.21 mg/dL (ref 0.50–1.35)
GFR calc non Af Amer: 67 mL/min — ABNORMAL LOW (ref >90)
GFR, EST AFRICAN AMERICAN: 78 mL/min — AB (ref >90)
GLUCOSE: 136 mg/dL — AB (ref 70–99)
POTASSIUM: 4.8 meq/L (ref 3.7–5.3)
Sodium: 137 mEq/L (ref 137–147)
Total Protein: 7.5 g/dL (ref 6.0–8.3)

## 2013-09-18 LAB — CBC WITH DIFFERENTIAL/PLATELET
Basophils Absolute: 0 10*3/uL (ref 0.0–0.1)
Basophils Relative: 0 % (ref 0–1)
EOS ABS: 0.1 10*3/uL (ref 0.0–0.7)
Eosinophils Relative: 1 % (ref 0–5)
HCT: 41.5 % (ref 39.0–52.0)
HEMOGLOBIN: 14.3 g/dL (ref 13.0–17.0)
Lymphocytes Relative: 7 % — ABNORMAL LOW (ref 12–46)
Lymphs Abs: 0.8 10*3/uL (ref 0.7–4.0)
MCH: 30.6 pg (ref 26.0–34.0)
MCHC: 34.5 g/dL (ref 30.0–36.0)
MCV: 88.9 fL (ref 78.0–100.0)
MONOS PCT: 3 % (ref 3–12)
Monocytes Absolute: 0.4 10*3/uL (ref 0.1–1.0)
NEUTROS ABS: 11.3 10*3/uL — AB (ref 1.7–7.7)
NEUTROS PCT: 89 % — AB (ref 43–77)
Platelets: 254 10*3/uL (ref 150–400)
RBC: 4.67 MIL/uL (ref 4.22–5.81)
RDW: 12.7 % (ref 11.5–15.5)
WBC: 12.6 10*3/uL — ABNORMAL HIGH (ref 4.0–10.5)

## 2013-09-18 LAB — URINALYSIS, ROUTINE W REFLEX MICROSCOPIC
BILIRUBIN URINE: NEGATIVE
Glucose, UA: NEGATIVE mg/dL
KETONES UR: NEGATIVE mg/dL
Leukocytes, UA: NEGATIVE
NITRITE: NEGATIVE
Protein, ur: NEGATIVE mg/dL
Specific Gravity, Urine: 1.025 (ref 1.005–1.030)
UROBILINOGEN UA: 0.2 mg/dL (ref 0.0–1.0)
pH: 5.5 (ref 5.0–8.0)

## 2013-09-18 LAB — URINE MICROSCOPIC-ADD ON

## 2013-09-18 MED ORDER — KETOROLAC TROMETHAMINE 30 MG/ML IJ SOLN
INTRAMUSCULAR | Status: AC
  Filled 2013-09-18: qty 1

## 2013-09-18 MED ORDER — TAMSULOSIN HCL 0.4 MG PO CAPS: 0.4000 mg | ORAL_CAPSULE | Freq: Every day | ORAL | Status: DC

## 2013-09-18 MED ORDER — OXYCODONE-ACETAMINOPHEN 5-325 MG PO TABS: 2.0000 | ORAL_TABLET | ORAL | Status: DC | PRN

## 2013-09-18 MED ORDER — HYDROMORPHONE HCL PF 1 MG/ML IJ SOLN
INTRAMUSCULAR | Status: AC
  Filled 2013-09-18: qty 1

## 2013-09-18 MED ORDER — ONDANSETRON HCL 4 MG/2ML IJ SOLN
INTRAMUSCULAR | Status: AC
  Filled 2013-09-18: qty 2

## 2013-09-18 MED ORDER — KETOROLAC TROMETHAMINE 30 MG/ML IJ SOLN
30.0000 mg | Freq: Once | INTRAMUSCULAR | Status: AC
  Administered 2013-09-18: 30 mg via INTRAVENOUS

## 2013-09-18 MED ORDER — ONDANSETRON HCL 4 MG/2ML IJ SOLN
4.0000 mg | Freq: Once | INTRAMUSCULAR | Status: AC
  Administered 2013-09-18: 4 mg via INTRAVENOUS

## 2013-09-18 MED ORDER — IBUPROFEN 800 MG PO TABS: 800.0000 mg | ORAL_TABLET | Freq: Three times a day (TID) | ORAL | Status: DC

## 2013-09-18 MED ORDER — SODIUM CHLORIDE 0.9 % IV BOLUS (SEPSIS)
1000.0000 mL | Freq: Once | INTRAVENOUS | Status: AC
  Administered 2013-09-18: 1000 mL via INTRAVENOUS

## 2013-09-18 MED ORDER — OXYCODONE-ACETAMINOPHEN 5-325 MG PO TABS
2.0000 | ORAL_TABLET | Freq: Once | ORAL | Status: AC
  Administered 2013-09-18: 2 via ORAL
  Filled 2013-09-18: qty 2

## 2013-09-18 MED ORDER — ONDANSETRON HCL 4 MG PO TABS: 4.0000 mg | ORAL_TABLET | Freq: Four times a day (QID) | ORAL | Status: DC

## 2013-09-18 MED ORDER — HYDROMORPHONE HCL PF 1 MG/ML IJ SOLN
1.0000 mg | Freq: Once | INTRAMUSCULAR | Status: AC
  Administered 2013-09-18: 1 mg via INTRAVENOUS

## 2013-09-18 MED ORDER — HYDROMORPHONE HCL PF 1 MG/ML IJ SOLN
1.0000 mg | Freq: Once | INTRAMUSCULAR | Status: AC
  Administered 2013-09-18: 1 mg via INTRAVENOUS
  Filled 2013-09-18: qty 1

## 2013-09-18 NOTE — ED Notes (Signed)
Pt reports left flank pain, n/v, difficulty urinating since 830 this am. Pt tearful and doubled over in triage. Pt reports kidney stone history.

## 2013-09-18 NOTE — Discharge Instructions (Signed)
Kidney Stones You need a followup CAT scan in 6 months to reevaluate the cysts in pelvis. Followup with the urologist. Return to the ED if you develop new or worsening symptoms. Kidney stones (urolithiasis) are deposits that form inside your kidneys. The intense pain is caused by the stone moving through the urinary tract. When the stone moves, the ureter goes into spasm around the stone. The stone is usually passed in the urine.  CAUSES   A disorder that makes certain neck glands produce too much parathyroid hormone (primary hyperparathyroidism).  A buildup of uric acid crystals, similar to gout in your joints.  Narrowing (stricture) of the ureter.  A kidney obstruction present at birth (congenital obstruction).  Previous surgery on the kidney or ureters.  Numerous kidney infections. SYMPTOMS   Feeling sick to your stomach (nauseous).  Throwing up (vomiting).  Blood in the urine (hematuria).  Pain that usually spreads (radiates) to the groin.  Frequency or urgency of urination. DIAGNOSIS   Taking a history and physical exam.  Blood or urine tests.  CT scan.  Occasionally, an examination of the inside of the urinary bladder (cystoscopy) is performed. TREATMENT   Observation.  Increasing your fluid intake.  Extracorporeal shock wave lithotripsy--This is a noninvasive procedure that uses shock waves to break up kidney stones.  Surgery may be needed if you have severe pain or persistent obstruction. There are various surgical procedures. Most of the procedures are performed with the use of small instruments. Only small incisions are needed to accommodate these instruments, so recovery time is minimized. The size, location, and chemical composition are all important variables that will determine the proper choice of action for you. Talk to your health care provider to better understand your situation so that you will minimize the risk of injury to yourself and your kidney.    HOME CARE INSTRUCTIONS   Drink enough water and fluids to keep your urine clear or pale yellow. This will help you to pass the stone or stone fragments.  Strain all urine through the provided strainer. Keep all particulate matter and stones for your health care provider to see. The stone causing the pain may be as small as a grain of salt. It is very important to use the strainer each and every time you pass your urine. The collection of your stone will allow your health care provider to analyze it and verify that a stone has actually passed. The stone analysis will often identify what you can do to reduce the incidence of recurrences.  Only take over-the-counter or prescription medicines for pain, discomfort, or fever as directed by your health care provider.  Make a follow-up appointment with your health care provider as directed.  Get follow-up X-rays if required. The absence of pain does not always mean that the stone has passed. It may have only stopped moving. If the urine remains completely obstructed, it can cause loss of kidney function or even complete destruction of the kidney. It is your responsibility to make sure X-rays and follow-ups are completed. Ultrasounds of the kidney can show blockages and the status of the kidney. Ultrasounds are not associated with any radiation and can be performed easily in a matter of minutes. SEEK MEDICAL CARE IF:  You experience pain that is progressive and unresponsive to any pain medicine you have been prescribed. SEEK IMMEDIATE MEDICAL CARE IF:   Pain cannot be controlled with the prescribed medicine.  You have a fever or shaking chills.  The severity  or intensity of pain increases over 18 hours and is not relieved by pain medicine.  You develop a new onset of abdominal pain.  You feel faint or pass out.  You are unable to urinate. MAKE SURE YOU:   Understand these instructions.  Will watch your condition.  Will get help right away  if you are not doing well or get worse. Document Released: 01/06/2005 Document Revised: 09/08/2012 Document Reviewed: 06/09/2012 [facility] Patient Information 2015 [place], [place]. This information is not intended to replace advice given to you by your health care provider. Make sure you discuss any questions you have with your health care provider.

## 2013-09-18 NOTE — ED Provider Notes (Signed)
CSN: [id number]     Arrival date & time 09/18/13  1036 History  This chart was scribed for David Essex, MD by Starleen Arms, ED Scribe. This patient was seen in room APA08/APA08 and the patient's care was started at 10:54 AM.   Chief Complaint  Patient presents with  . Flank Pain   HPI  HPI Comments: David Hartman is a 53 y.o. male with a history of nephrolithiasis who presents to the Emergency Department complaining of severe, sudden, unchanged right flank pain onset 8:30 this morning with associated diaphoresis, two episodes of emesis, and dysuria.  Patient states that this episode feels similar to his past episodes of nephrolithiases.  Patient denies hematuria.   Past Medical History  Diagnosis Date  . Chest pain   . Shoulder pain   . Depression   . Erectile dysfunction   . GERD (gastroesophageal reflux disease)   . Hyperlipidemia   . HTN (hypertension)   . Insomnia   . Nephrolithiasis   . Prostate cancer   . Basal cell carcinoma   . Dysuria   . Gastroenteritis   . Insomnia   . Onychomycosis    Past Surgical History  Procedure Laterality Date  . Prostatectomy     History reviewed. No pertinent family history. History  Substance Use Topics  . Smoking status: Never Smoker   . Smokeless tobacco: Not on file  . Alcohol Use: No     Comment: "a beer once in a while"     Review of Systems  A complete 10 system review of systems was obtained and all systems are negative except as noted in the HPI and PMH.   Allergies  Review of patient's allergies indicates no known allergies.  Home Medications   Prior to Admission medications   Medication Sig Start Date End Date Taking? Authorizing Provider  allopurinol (ZYLOPRIM) 300 MG tablet Take 300 mg by mouth daily.   Yes Historical Provider, MD  atenolol (TENORMIN) 25 MG tablet Take 25 mg by mouth daily.   Yes Historical Provider, MD  colchicine 0.6 MG tablet Take 1 tablet (0.6 mg total) by mouth daily. 01/31/13  Yes  Tanna Furry, MD  lisinopril (PRINIVIL,ZESTRIL) 10 MG tablet Take 10 mg by mouth daily.   Yes Historical Provider, MD  omeprazole (PRILOSEC OTC) 20 MG tablet Take 20 mg by mouth daily.   Yes Historical Provider, MD  oxyCODONE-acetaminophen (PERCOCET) 5-325 MG per tablet Take 2 tablets by mouth every 4 (four) hours as needed. 06/02/13  Yes Nat Christen, MD  simvastatin (ZOCOR) 40 MG tablet Take 40 mg by mouth every evening.   Yes Historical Provider, MD  ibuprofen (ADVIL,MOTRIN) 800 MG tablet Take 1 tablet (800 mg total) by mouth 3 (three) times daily. 09/18/13   David Essex, MD  ondansetron (ZOFRAN) 4 MG tablet Take 1 tablet (4 mg total) by mouth every 6 (six) hours. 09/18/13   David Essex, MD  oxyCODONE-acetaminophen (PERCOCET/ROXICET) 5-325 MG per tablet Take 2 tablets by mouth every 4 (four) hours as needed for severe pain. 09/18/13   David Essex, MD  tamsulosin (FLOMAX) 0.4 MG CAPS capsule Take 1 capsule (0.4 mg total) by mouth daily. 09/18/13   David Essex, MD   BP 135/76  Pulse 60  Temp(Src) 97.5 F (36.4 C) (Oral)  Resp 12  Ht 5\' 10"  (1.778 m)  Wt 234 lb (106.142 kg)  BMI 33.58 kg/m2  SpO2 99% Physical Exam  Nursing note and vitals reviewed. Constitutional: He is oriented to person,  place, and time. He appears well-developed and well-nourished. No distress.  Uncomfortable.    HENT:  Head: Normocephalic and atraumatic.  Mouth/Throat: Oropharynx is clear and moist. No oropharyngeal exudate.  Eyes: Conjunctivae and EOM are normal. Pupils are equal, round, and reactive to light.  Neck: Normal range of motion. Neck supple.  No meningismus.  Cardiovascular: Normal rate, regular rhythm, normal heart sounds and intact distal pulses.   No murmur heard. Pulmonary/Chest: Effort normal and breath sounds normal. No respiratory distress.  Abdominal: Soft. There is tenderness. There is no rebound and no guarding.  Right-sided lower abdominal tenderness.   Genitourinary:  No CVA  tenderness.  No testicular tenderness.  Musculoskeletal: Normal range of motion. He exhibits no edema and no tenderness.  Neurological: He is alert and oriented to person, place, and time. No cranial nerve deficit. He exhibits normal muscle tone. Coordination normal.  No ataxia on finger to nose bilaterally. No pronator drift. 5/5 strength throughout. CN 2-12 intact. Negative Romberg. Equal grip strength. Sensation intact. Gait is normal.   Skin: Skin is warm.  Psychiatric: He has a normal mood and affect. His behavior is normal.    ED Course  Procedures (including critical care time)  COORDINATION OF CARE:  10:57 AM will order pain medication and antiemetics.  Patient acknowledges and agrees with plan.    1:28 PM Upon recheck, patient's pain has improved.  Informed patient that stone has likely migrated to his bladder and discussed plan to let patient pass kidney stone independently and discharge patient home.  Will prescribe pain medication.   Labs Review Labs Reviewed  CBC WITH DIFFERENTIAL - Abnormal; Notable for the following:    WBC 12.6 (*)    Neutrophils Relative % 89 (*)    Neutro Abs 11.3 (*)    Lymphocytes Relative 7 (*)    All other components within normal limits  COMPREHENSIVE METABOLIC PANEL - Abnormal; Notable for the following:    Glucose, Bld 136 (*)    GFR calc non Af Amer 67 (*)    GFR calc Af Amer 78 (*)    All other components within normal limits  URINALYSIS, ROUTINE W REFLEX MICROSCOPIC - Abnormal; Notable for the following:    Hgb urine dipstick SMALL (*)    All other components within normal limits  URINE MICROSCOPIC-ADD ON    Imaging Review Ct Abdomen Pelvis Wo Contrast  09/18/2013   CLINICAL DATA:  53 year old male with right flank and abdominal pain. History urinary calculi.  EXAM: CT ABDOMEN AND PELVIS WITHOUT CONTRAST  TECHNIQUE: Multidetector CT imaging of the abdomen and pelvis was performed following the standard protocol without IV contrast.   COMPARISON:  06/02/2013 radiographs  FINDINGS: A 2 mm right UVJ calculus causes mild to moderate right hydroureteronephrosis and right perinephric inflammation.  At least 3 punctate nonobstructing left renal calculi are identified.  Mild fatty infiltration of the liver is identified.  The spleen, adrenal glands, pancreas, and gallbladder are unremarkable.  Please note that parenchymal abnormalities may be missed without intravenous contrast.  There is no evidence of free fluid, enlarged lymph nodes, biliary dilation or abdominal aortic aneurysm.  High density structures/calcifications within the appendix noted without evidence of appendicitis.  There is no evidence of bowel obstruction, bowel wall thickening, pneumoperitoneum or suspicious collection.  A 3.8 x 4.5 x 7.2 cm unilocular appearing cyst in lateral left pelvis contains faint rim calcification and is not adjacent to bowel. This probably represents a benign process such as a prior  hematoma, lymphocele or duplication cyst.  No acute or suspicious bony abnormalities are identified. Mild degenerative disc disease and spondylosis at L5-S1 noted.  IMPRESSION: 2 mm right UVJ calculus causing mild to moderate right hydroureteronephrosis.  Punctate nonobstructing left renal calculi.  Mild fatty infiltration of the liver.  3.8 x 4.5 x 7.2 cm likely benign unilocular appearing left pelvic cystic structure.   Electronically Signed   By: Hassan Rowan M.D.   On: 09/18/2013 12:29     EKG Interpretation None      MDM   Final diagnoses:  Renal colic   Constant right flank and lower abdominal pain with nausea and vomiting at 8:30 AM. Similar to previous kidney stones. Denies dysuria hematuria.  Pain control with IV fluids, antibiotics, narcotics.  UA shows hematuria without infection. CT scan shows 2 mm right UVJ calculus with mild hydronephrosis. There is a cystic pelvic structure is favored to be benign.  Pain improved in the ED. Patient tolerating by  mouth. Patient informed of cystic mass in pelvis.  Hx prostate cancer.  D/w Dr. Melanee Spry who favors benign process and recommends follow up in 6 months.  Tolerating PO in the ED.  Will treat pain, given antiemetics, flomax.  Followup with urology.  Follow up with PCP regarding cystic mass.  BP 135/76  Pulse 60  Temp(Src) 97.5 F (36.4 C) (Oral)  Resp 12  Ht 5\' 10"  (1.778 m)  Wt 234 lb (106.142 kg)  BMI 33.58 kg/m2  SpO2 99%    I personally performed the services described in this documentation, which was scribed in my presence. The recorded information has been reviewed and is accurate.   David Essex, MD 09/18/13 [phone]

## 2014-01-26 ENCOUNTER — Emergency Department (HOSPITAL_COMMUNITY): Payer: 59

## 2014-01-26 ENCOUNTER — Emergency Department (HOSPITAL_COMMUNITY)
Admission: EM | Admit: 2014-01-26 | Discharge: 2014-01-26 | Disposition: A | Discharge status: Home / Self Care | Payer: 59 | Attending: Emergency Medicine | Admitting: Emergency Medicine

## 2014-01-26 ENCOUNTER — Encounter (HOSPITAL_COMMUNITY): Payer: Self-pay | Admitting: *Deleted

## 2014-01-26 DIAGNOSIS — N529 Male erectile dysfunction, unspecified: Secondary | ICD-10-CM | POA: Diagnosis not present

## 2014-01-26 DIAGNOSIS — Z8619 Personal history of other infectious and parasitic diseases: Secondary | ICD-10-CM | POA: Diagnosis not present

## 2014-01-26 DIAGNOSIS — I10 Essential (primary) hypertension: Secondary | ICD-10-CM | POA: Insufficient documentation

## 2014-01-26 DIAGNOSIS — M109 Gout, unspecified: Secondary | ICD-10-CM | POA: Diagnosis not present

## 2014-01-26 DIAGNOSIS — R109 Unspecified abdominal pain: Secondary | ICD-10-CM

## 2014-01-26 DIAGNOSIS — E785 Hyperlipidemia, unspecified: Secondary | ICD-10-CM | POA: Diagnosis not present

## 2014-01-26 DIAGNOSIS — F329 Major depressive disorder, single episode, unspecified: Secondary | ICD-10-CM | POA: Insufficient documentation

## 2014-01-26 DIAGNOSIS — Z8669 Personal history of other diseases of the nervous system and sense organs: Secondary | ICD-10-CM | POA: Diagnosis not present

## 2014-01-26 DIAGNOSIS — N201 Calculus of ureter: Secondary | ICD-10-CM

## 2014-01-26 DIAGNOSIS — E780 Pure hypercholesterolemia: Secondary | ICD-10-CM | POA: Diagnosis not present

## 2014-01-26 DIAGNOSIS — K219 Gastro-esophageal reflux disease without esophagitis: Secondary | ICD-10-CM | POA: Diagnosis not present

## 2014-01-26 DIAGNOSIS — Z8546 Personal history of malignant neoplasm of prostate: Secondary | ICD-10-CM | POA: Diagnosis not present

## 2014-01-26 DIAGNOSIS — Z79899 Other long term (current) drug therapy: Secondary | ICD-10-CM | POA: Insufficient documentation

## 2014-01-26 DIAGNOSIS — Z85828 Personal history of other malignant neoplasm of skin: Secondary | ICD-10-CM | POA: Insufficient documentation

## 2014-01-26 HISTORY — DX: Pure hypercholesterolemia, unspecified: E78.00

## 2014-01-26 HISTORY — DX: Gout, unspecified: M10.9

## 2014-01-26 LAB — URINALYSIS, ROUTINE W REFLEX MICROSCOPIC
Bilirubin Urine: NEGATIVE
GLUCOSE, UA: NEGATIVE mg/dL
Ketones, ur: NEGATIVE mg/dL
Leukocytes, UA: NEGATIVE
NITRITE: NEGATIVE
PH: 5.5 (ref 5.0–8.0)
Protein, ur: NEGATIVE mg/dL
Specific Gravity, Urine: 1.01 (ref 1.005–1.030)
Urobilinogen, UA: 0.2 mg/dL (ref 0.0–1.0)

## 2014-01-26 LAB — CBC WITH DIFFERENTIAL/PLATELET
BASOS ABS: 0 10*3/uL (ref 0.0–0.1)
BASOS PCT: 0 % (ref 0–1)
Eosinophils Absolute: 0.1 10*3/uL (ref 0.0–0.7)
Eosinophils Relative: 1 % (ref 0–5)
HEMATOCRIT: 41.7 % (ref 39.0–52.0)
Hemoglobin: 13.8 g/dL (ref 13.0–17.0)
LYMPHS PCT: 7 % — AB (ref 12–46)
Lymphs Abs: 0.8 10*3/uL (ref 0.7–4.0)
MCH: 29.8 pg (ref 26.0–34.0)
MCHC: 33.1 g/dL (ref 30.0–36.0)
MCV: 90.1 fL (ref 78.0–100.0)
Monocytes Absolute: 0.5 10*3/uL (ref 0.1–1.0)
Monocytes Relative: 4 % (ref 3–12)
NEUTROS ABS: 10.8 10*3/uL — AB (ref 1.7–7.7)
Neutrophils Relative %: 88 % — ABNORMAL HIGH (ref 43–77)
Platelets: 272 10*3/uL (ref 150–400)
RBC: 4.63 MIL/uL (ref 4.22–5.81)
RDW: 12.8 % (ref 11.5–15.5)
WBC: 12.3 10*3/uL — AB (ref 4.0–10.5)

## 2014-01-26 LAB — BASIC METABOLIC PANEL
ANION GAP: 6 (ref 5–15)
BUN: 17 mg/dL (ref 6–23)
CHLORIDE: 104 meq/L (ref 96–112)
CO2: 24 mmol/L (ref 19–32)
Calcium: 8.9 mg/dL (ref 8.4–10.5)
Creatinine, Ser: 1.07 mg/dL (ref 0.50–1.35)
GFR, EST NON AFRICAN AMERICAN: 78 mL/min — AB (ref >90)
Glucose, Bld: 105 mg/dL — ABNORMAL HIGH (ref 70–99)
POTASSIUM: 4.2 mmol/L (ref 3.5–5.1)
SODIUM: 134 mmol/L — AB (ref 135–145)

## 2014-01-26 LAB — URINE MICROSCOPIC-ADD ON

## 2014-01-26 MED ORDER — SODIUM CHLORIDE 0.9 % IV SOLN: INTRAVENOUS | Status: DC

## 2014-01-26 MED ORDER — HYDROMORPHONE HCL 1 MG/ML IJ SOLN
1.0000 mg | Freq: Once | INTRAMUSCULAR | Status: AC
  Administered 2014-01-26: 1 mg via INTRAVENOUS
  Filled 2014-01-26: qty 1

## 2014-01-26 MED ORDER — ONDANSETRON 4 MG PO TBDP: 4.0000 mg | ORAL_TABLET | Freq: Three times a day (TID) | ORAL | Status: DC | PRN

## 2014-01-26 MED ORDER — SODIUM CHLORIDE 0.9 % IV BOLUS (SEPSIS)
1000.0000 mL | Freq: Once | INTRAVENOUS | Status: AC
  Administered 2014-01-26: 1000 mL via INTRAVENOUS

## 2014-01-26 MED ORDER — TAMSULOSIN HCL 0.4 MG PO CAPS: 0.4000 mg | ORAL_CAPSULE | Freq: Every day | ORAL | Status: DC

## 2014-01-26 MED ORDER — OXYCODONE-ACETAMINOPHEN 5-325 MG PO TABS: 1.0000 | ORAL_TABLET | Freq: Four times a day (QID) | ORAL | Status: DC | PRN

## 2014-01-26 MED ORDER — ONDANSETRON HCL 4 MG/2ML IJ SOLN
4.0000 mg | Freq: Once | INTRAMUSCULAR | Status: AC
  Administered 2014-01-26: 4 mg via INTRAVENOUS
  Filled 2014-01-26: qty 2

## 2014-01-26 NOTE — ED Notes (Signed)
Pt with bilateral flank pain since 1230, pt believes it's kidney stones, hx of kidney stone, + nausea but denies vomiting

## 2014-01-26 NOTE — ED Provider Notes (Signed)
CSN: [id number]     Arrival date & time 01/26/14  1442 History  This chart was scribed for Fredia Sorrow, MD by Edison Simon, ED Scribe. This patient was seen in room APA12/APA12 and the patient's care was started at 3:21 PM.    Chief Complaint  Patient presents with  . Flank Pain   Patient is a 54 y.o. male presenting with flank pain. The history is provided by the patient. No language interpreter was used.  Flank Pain This is a recurrent problem. The current episode started 1 to 2 hours ago. The problem occurs constantly. The problem has been gradually improving. Pertinent negatives include no chest pain, no abdominal pain, no headaches and no shortness of breath. Nothing aggravates the symptoms. Nothing relieves the symptoms. He has tried nothing for the symptoms.    HPI Comments: David Hartman is a 54 y.o. male with history of kidney stones who presents to the Emergency Department complaining of bilateral low back pain and left flank since 1230 today. He states it started suddenly on the left side and is worse on the left side. He states it feels just like prior kidney stone pain. He rates it at 5-6/10 now and at 10/10 at its worst. He states his last kidney stone was in August. He does not have a urologist. He reports associated nausea earlier with cold sweats, but denies vomiting.  PCP: Tawni Carnes, PA-C with [facility] in [place]  Past Medical History  Diagnosis Date  . Chest pain   . Shoulder pain   . Depression   . Erectile dysfunction   . GERD (gastroesophageal reflux disease)   . Hyperlipidemia   . HTN (hypertension)   . Insomnia   . Nephrolithiasis   . Dysuria   . Gastroenteritis   . Insomnia   . Onychomycosis   . Prostate cancer   . Basal cell carcinoma   . High cholesterol   . Gout    Past Surgical History  Procedure Laterality Date  . Prostatectomy     History reviewed. No pertinent family history. History  Substance Use Topics  . Smoking status: Never  Smoker   . Smokeless tobacco: Not on file  . Alcohol Use: No     Comment: "a beer once in a while"     Review of Systems  Constitutional: Positive for chills and diaphoresis. Negative for fever.  HENT: Negative for rhinorrhea and sore throat.   Eyes: Negative for visual disturbance.  Respiratory: Negative for cough and shortness of breath.   Cardiovascular: Negative for chest pain and leg swelling.  Gastrointestinal: Positive for nausea. Negative for vomiting, abdominal pain and diarrhea.  Genitourinary: Positive for dysuria and flank pain. Negative for hematuria.  Musculoskeletal: Positive for back pain.  Skin: Negative for rash.  Neurological: Negative for headaches.  Hematological: Does not bruise/bleed easily.  Psychiatric/Behavioral: Negative for confusion.      Allergies  Review of patient's allergies indicates no known allergies.  Home Medications   Prior to Admission medications   Medication Sig Start Date End Date Taking? Authorizing Provider  allopurinol (ZYLOPRIM) 300 MG tablet Take 300 mg by mouth daily.   Yes Historical Provider, MD  ALPRAZolam Duanne Moron) 1 MG tablet Take 1 mg by mouth daily as needed for anxiety.  12/07/13  Yes Historical Provider, MD  atenolol (TENORMIN) 25 MG tablet Take 25 mg by mouth daily.   Yes Historical Provider, MD  lisinopril (PRINIVIL,ZESTRIL) 10 MG tablet Take 10 mg by mouth daily.   Yes  Historical Provider, MD  omeprazole (PRILOSEC OTC) 20 MG tablet Take 20 mg by mouth daily.   Yes Historical Provider, MD  simvastatin (ZOCOR) 40 MG tablet Take 40 mg by mouth every evening.   Yes Historical Provider, MD  colchicine 0.6 MG tablet Take 1 tablet (0.6 mg total) by mouth daily. Patient not taking: Reported on 01/26/2014 01/31/13   Tanna Furry, MD  ibuprofen (ADVIL,MOTRIN) 800 MG tablet Take 1 tablet (800 mg total) by mouth 3 (three) times daily. Patient not taking: Reported on 01/26/2014 09/18/13   Ezequiel Essex, MD  ondansetron (ZOFRAN ODT) 4 MG  disintegrating tablet Take 1 tablet (4 mg total) by mouth every 8 (eight) hours as needed. 01/26/14   Fredia Sorrow, MD  ondansetron (ZOFRAN) 4 MG tablet Take 1 tablet (4 mg total) by mouth every 6 (six) hours. Patient not taking: Reported on 01/26/2014 09/18/13   Ezequiel Essex, MD  oxyCODONE-acetaminophen (PERCOCET) 5-325 MG per tablet Take 2 tablets by mouth every 4 (four) hours as needed. Patient not taking: Reported on 01/26/2014 06/02/13   Nat Christen, MD  oxyCODONE-acetaminophen (PERCOCET/ROXICET) 5-325 MG per tablet Take 2 tablets by mouth every 4 (four) hours as needed for severe pain. Patient not taking: Reported on 01/26/2014 09/18/13   Ezequiel Essex, MD  oxyCODONE-acetaminophen (PERCOCET/ROXICET) 5-325 MG per tablet Take 1-2 tablets by mouth every 6 (six) hours as needed for moderate pain or severe pain. 01/26/14   Fredia Sorrow, MD  tamsulosin (FLOMAX) 0.4 MG CAPS capsule Take 1 capsule (0.4 mg total) by mouth daily. Patient not taking: Reported on 01/26/2014 09/18/13   Ezequiel Essex, MD  tamsulosin (FLOMAX) 0.4 MG CAPS capsule Take 1 capsule (0.4 mg total) by mouth daily. 01/26/14   Fredia Sorrow, MD   BP 146/74 mmHg  Pulse 65  Temp(Src) 97.8 F (36.6 C) (Oral)  Resp 18  Ht 5\' 10"  (1.778 m)  Wt 246 lb (111.585 kg)  BMI 35.30 kg/m2  SpO2 100% Physical Exam  Constitutional: He is oriented to person, place, and time. He appears well-developed and well-nourished.  HENT:  Head: Normocephalic and atraumatic.  Mucous membranes moist  Eyes: Conjunctivae and EOM are normal.  Sclera clear  Neck: Normal range of motion. Neck supple.  Pulmonary/Chest: Effort normal.  Musculoskeletal: Normal range of motion.  Neurological: He is alert and oriented to person, place, and time. No cranial nerve deficit. He exhibits normal muscle tone. Coordination normal.  Skin: Skin is warm and dry.  Psychiatric: He has a normal mood and affect.  Nursing note and vitals reviewed.   ED Course  Procedures  (including critical care time)  DIAGNOSTIC STUDIES: Oxygen Saturation is 100% on room air, normal by my interpretation.    COORDINATION OF CARE: 3:26 PM Discussed treatment plan with patient at beside, the patient agrees with the plan and has no further questions at this time.   Labs Review Labs Reviewed  CBC WITH DIFFERENTIAL - Abnormal; Notable for the following:    WBC 12.3 (*)    Neutrophils Relative % 88 (*)    Neutro Abs 10.8 (*)    Lymphocytes Relative 7 (*)    All other components within normal limits  BASIC METABOLIC PANEL - Abnormal; Notable for the following:    Sodium 134 (*)    Glucose, Bld 105 (*)    GFR calc non Af Amer 78 (*)    All other components within normal limits  URINALYSIS, ROUTINE W REFLEX MICROSCOPIC - Abnormal; Notable for the following:  Hgb urine dipstick TRACE (*)    All other components within normal limits  URINE MICROSCOPIC-ADD ON    Imaging Review Ct Renal Stone Study  01/26/2014   CLINICAL DATA:  Bilateral flank pain, worse on left-sided. Patient has history of multiple renal stones.  EXAM: CT ABDOMEN AND PELVIS WITHOUT CONTRAST  TECHNIQUE: Multidetector CT imaging of the abdomen and pelvis was performed following the standard protocol without IV contrast.  COMPARISON:  September 18, 2013  FINDINGS: There is moderate left hydroureteronephrosis due to obstruction by 6 mm stone in the distal left ureter just proximal to the ureterovesicular junction. There are small nonobstructing stones in bilateral kidneys. There is no right hydronephrosis. Left peri nephric stranding is identified.  There is diffuse low density of the liver. The spleen, pancreas, gallbladder, adrenal glands are normal. There is minimal atherosclerosis of the abdominal aorta without aneurysmal dilatation. There is no abdominal lymphadenopathy. There is no small bowel obstruction or diverticulitis. The appendix is normal.  Partial fluid-filled bladder is normal. Left pelvic cystic area  is unchanged. The visualized lung bases are clear. Degenerative joint changes of the spine are noted.  IMPRESSION: Moderate left hydroureteronephrosis due to obstruction by 6 mm stone in the distal left ureter just proximal to the ureterovesicular junction.   Electronically Signed   By: Abelardo Diesel M.D.   On: 01/26/2014 16:16     EKG Interpretation None      MDM   Final diagnoses:  Left flank pain  Left ureteral stone    CT scan confirms left-sided the ureteral stone. Almost passed. No complicating factors. Renal function normal. Urinalysis negative for infection. There is some evidence of hydronephrosis. Patient will be started on pain medicine and antinausea medicine and Flomax and follow-up with urology.  I personally performed the services described in this documentation, which was scribed in my presence. The recorded information has been reviewed and is accurate.     Fredia Sorrow, MD 01/26/14 1810

## 2014-01-26 NOTE — Discharge Instructions (Signed)
Call urology for follow-up. Take the Percocet as needed for pain. Take Zofran as needed for nausea. Take Flomax to help you urine flow. Return for any new or worse symptoms. Would expect you to pass the stone in 2 days or earlier.

## 2014-01-28 ENCOUNTER — Encounter (HOSPITAL_COMMUNITY): Payer: Self-pay | Admitting: *Deleted

## 2014-01-28 ENCOUNTER — Observation Stay (HOSPITAL_COMMUNITY)
Admission: EM | Admit: 2014-01-28 | Discharge: 2014-01-30 | DRG: 670 | Disposition: A | Discharge status: Home / Self Care | Payer: 59 | Attending: Urology | Admitting: Urology

## 2014-01-28 DIAGNOSIS — R109 Unspecified abdominal pain: Secondary | ICD-10-CM | POA: Diagnosis present

## 2014-01-28 DIAGNOSIS — Z79899 Other long term (current) drug therapy: Secondary | ICD-10-CM | POA: Insufficient documentation

## 2014-01-28 DIAGNOSIS — K449 Diaphragmatic hernia without obstruction or gangrene: Secondary | ICD-10-CM | POA: Insufficient documentation

## 2014-01-28 DIAGNOSIS — F329 Major depressive disorder, single episode, unspecified: Secondary | ICD-10-CM | POA: Insufficient documentation

## 2014-01-28 DIAGNOSIS — Z87442 Personal history of urinary calculi: Secondary | ICD-10-CM | POA: Diagnosis not present

## 2014-01-28 DIAGNOSIS — Z791 Long term (current) use of non-steroidal anti-inflammatories (NSAID): Secondary | ICD-10-CM | POA: Diagnosis not present

## 2014-01-28 DIAGNOSIS — R1012 Left upper quadrant pain: Secondary | ICD-10-CM | POA: Diagnosis present

## 2014-01-28 DIAGNOSIS — Z79891 Long term (current) use of opiate analgesic: Secondary | ICD-10-CM | POA: Insufficient documentation

## 2014-01-28 DIAGNOSIS — M109 Gout, unspecified: Secondary | ICD-10-CM | POA: Insufficient documentation

## 2014-01-28 DIAGNOSIS — F32A Depression, unspecified: Secondary | ICD-10-CM | POA: Diagnosis present

## 2014-01-28 DIAGNOSIS — N132 Hydronephrosis with renal and ureteral calculous obstruction: Principal | ICD-10-CM | POA: Insufficient documentation

## 2014-01-28 DIAGNOSIS — E7800 Pure hypercholesterolemia, unspecified: Secondary | ICD-10-CM | POA: Diagnosis present

## 2014-01-28 DIAGNOSIS — R52 Pain, unspecified: Secondary | ICD-10-CM | POA: Diagnosis present

## 2014-01-28 DIAGNOSIS — Z8546 Personal history of malignant neoplasm of prostate: Secondary | ICD-10-CM | POA: Insufficient documentation

## 2014-01-28 DIAGNOSIS — N390 Urinary tract infection, site not specified: Secondary | ICD-10-CM | POA: Diagnosis present

## 2014-01-28 DIAGNOSIS — K219 Gastro-esophageal reflux disease without esophagitis: Secondary | ICD-10-CM | POA: Insufficient documentation

## 2014-01-28 DIAGNOSIS — N200 Calculus of kidney: Secondary | ICD-10-CM

## 2014-01-28 DIAGNOSIS — I10 Essential (primary) hypertension: Secondary | ICD-10-CM | POA: Diagnosis present

## 2014-01-28 DIAGNOSIS — N201 Calculus of ureter: Secondary | ICD-10-CM

## 2014-01-28 LAB — CBC WITH DIFFERENTIAL/PLATELET
BASOS ABS: 0 10*3/uL (ref 0.0–0.1)
Basophils Relative: 0 % (ref 0–1)
EOS PCT: 1 % (ref 0–5)
Eosinophils Absolute: 0.1 10*3/uL (ref 0.0–0.7)
HEMATOCRIT: 40.7 % (ref 39.0–52.0)
Hemoglobin: 13.4 g/dL (ref 13.0–17.0)
LYMPHS ABS: 1.4 10*3/uL (ref 0.7–4.0)
Lymphocytes Relative: 9 % — ABNORMAL LOW (ref 12–46)
MCH: 29.8 pg (ref 26.0–34.0)
MCHC: 32.9 g/dL (ref 30.0–36.0)
MCV: 90.4 fL (ref 78.0–100.0)
MONO ABS: 0.9 10*3/uL (ref 0.1–1.0)
MONOS PCT: 6 % (ref 3–12)
Neutro Abs: 13.2 10*3/uL — ABNORMAL HIGH (ref 1.7–7.7)
Neutrophils Relative %: 84 % — ABNORMAL HIGH (ref 43–77)
Platelets: 256 10*3/uL (ref 150–400)
RBC: 4.5 MIL/uL (ref 4.22–5.81)
RDW: 12.6 % (ref 11.5–15.5)
WBC: 15.6 10*3/uL — ABNORMAL HIGH (ref 4.0–10.5)

## 2014-01-28 LAB — BASIC METABOLIC PANEL
ANION GAP: 6 (ref 5–15)
BUN: 14 mg/dL (ref 6–23)
CALCIUM: 8.7 mg/dL (ref 8.4–10.5)
CO2: 24 mmol/L (ref 19–32)
CREATININE: 1.26 mg/dL (ref 0.50–1.35)
Chloride: 105 mEq/L (ref 96–112)
GFR, EST AFRICAN AMERICAN: 74 mL/min — AB (ref >90)
GFR, EST NON AFRICAN AMERICAN: 64 mL/min — AB (ref >90)
GLUCOSE: 104 mg/dL — AB (ref 70–99)
Potassium: 4.1 mmol/L (ref 3.5–5.1)
SODIUM: 135 mmol/L (ref 135–145)

## 2014-01-28 MED ORDER — KETOROLAC TROMETHAMINE 30 MG/ML IJ SOLN
30.0000 mg | Freq: Once | INTRAMUSCULAR | Status: AC
  Administered 2014-01-28: 30 mg via INTRAVENOUS
  Filled 2014-01-28: qty 1

## 2014-01-28 MED ORDER — ONDANSETRON HCL 4 MG/2ML IJ SOLN
4.0000 mg | Freq: Once | INTRAMUSCULAR | Status: AC
  Administered 2014-01-28: 4 mg via INTRAVENOUS
  Filled 2014-01-28: qty 2

## 2014-01-28 MED ORDER — MORPHINE SULFATE 4 MG/ML IJ SOLN
4.0000 mg | Freq: Once | INTRAMUSCULAR | Status: AC
  Administered 2014-01-28: 4 mg via INTRAVENOUS
  Filled 2014-01-28: qty 1

## 2014-01-28 NOTE — ED Notes (Signed)
Pt c/o left sided back, flank, and rib pain. Pt was seen here for kidney stones on Thursday and Percocet is not working.

## 2014-01-28 NOTE — ED Provider Notes (Addendum)
CSN: [id number]     Arrival date & time 01/28/14  2115 History  This chart was scribed for Veryl Speak, MD by Randa Evens, ED Scribe. This patient was seen in room APA18/AP[facility]18 and the patient's care was started at 11:08 PM.      Chief Complaint  Patient presents with  . Flank Pain   Patient is a 54 y.o. male presenting with flank pain. The history is provided by the patient. No language interpreter was used.  Flank Pain Associated symptoms include abdominal pain.   HPI Comments: David Hartman is a 54 y.o. male who presents to the Emergency Department complaining of left sided flank pain onset tonight at 7 PM. Pt states that the pain radiates up towards his ribs.  Pt states he has associated left sided abdominal pain, nausea, and back pain. Pt states that he is also having some dysuria. Pt states that he hasn't had a bowel movement in 2 days. Pt states that he was here 2 days ago and states that they found he has a kidney stone. Pt states that the oxycodone prescribed has not provided any relief due to making him sick. Pt also states that he ha cyst present in lower abdomen. Denies fever, chills or other related symptoms.    Past Medical History  Diagnosis Date  . Chest pain   . Shoulder pain   . Depression   . Erectile dysfunction   . GERD (gastroesophageal reflux disease)   . Hyperlipidemia   . HTN (hypertension)   . Insomnia   . Nephrolithiasis   . Dysuria   . Gastroenteritis   . Insomnia   . Onychomycosis   . Prostate cancer   . Basal cell carcinoma   . High cholesterol   . Gout    Past Surgical History  Procedure Laterality Date  . Prostatectomy     History reviewed. No pertinent family history. History  Substance Use Topics  . Smoking status: Never Smoker   . Smokeless tobacco: Not on file  . Alcohol Use: No     Comment: "a beer once in a while"     Review of Systems  Constitutional: Negative for fever and chills.  Gastrointestinal: Positive for nausea and  abdominal pain.  Genitourinary: Positive for dysuria and flank pain.  Musculoskeletal: Positive for back pain.  All other systems reviewed and are negative.     Allergies  Review of patient's allergies indicates no known allergies.  Home Medications   Prior to Admission medications   Medication Sig Start Date End Date Taking? Authorizing Provider  allopurinol (ZYLOPRIM) 300 MG tablet Take 300 mg by mouth daily.    Historical Provider, MD  ALPRAZolam Duanne Moron) 1 MG tablet Take 1 mg by mouth daily as needed for anxiety.  12/07/13   Historical Provider, MD  atenolol (TENORMIN) 25 MG tablet Take 25 mg by mouth daily.    Historical Provider, MD  colchicine 0.6 MG tablet Take 1 tablet (0.6 mg total) by mouth daily. Patient not taking: Reported on 01/26/2014 01/31/13   Tanna Furry, MD  ibuprofen (ADVIL,MOTRIN) 800 MG tablet Take 1 tablet (800 mg total) by mouth 3 (three) times daily. Patient not taking: Reported on 01/26/2014 09/18/13   Ezequiel Essex, MD  lisinopril (PRINIVIL,ZESTRIL) 10 MG tablet Take 10 mg by mouth daily.    Historical Provider, MD  omeprazole (PRILOSEC OTC) 20 MG tablet Take 20 mg by mouth daily.    Historical Provider, MD  ondansetron (ZOFRAN ODT) 4 MG disintegrating  tablet Take 1 tablet (4 mg total) by mouth every 8 (eight) hours as needed. 01/26/14   Fredia Sorrow, MD  ondansetron (ZOFRAN) 4 MG tablet Take 1 tablet (4 mg total) by mouth every 6 (six) hours. Patient not taking: Reported on 01/26/2014 09/18/13   Ezequiel Essex, MD  oxyCODONE-acetaminophen (PERCOCET) 5-325 MG per tablet Take 2 tablets by mouth every 4 (four) hours as needed. Patient not taking: Reported on 01/26/2014 06/02/13   Nat Christen, MD  oxyCODONE-acetaminophen (PERCOCET/ROXICET) 5-325 MG per tablet Take 2 tablets by mouth every 4 (four) hours as needed for severe pain. Patient not taking: Reported on 01/26/2014 09/18/13   Ezequiel Essex, MD  oxyCODONE-acetaminophen (PERCOCET/ROXICET) 5-325 MG per tablet Take 1-2  tablets by mouth every 6 (six) hours as needed for moderate pain or severe pain. 01/26/14   Fredia Sorrow, MD  simvastatin (ZOCOR) 40 MG tablet Take 40 mg by mouth every evening.    Historical Provider, MD  tamsulosin (FLOMAX) 0.4 MG CAPS capsule Take 1 capsule (0.4 mg total) by mouth daily. Patient not taking: Reported on 01/26/2014 09/18/13   Ezequiel Essex, MD  tamsulosin (FLOMAX) 0.4 MG CAPS capsule Take 1 capsule (0.4 mg total) by mouth daily. 01/26/14   Fredia Sorrow, MD   Triage Vitals: BP 163/91 mmHg  Pulse 78  Temp(Src) 99.1 F (37.3 C) (Oral)  Resp 22  Ht 5\' 10"  (1.778 m)  Wt 245 lb (111.131 kg)  BMI 35.15 kg/m2  SpO2 100%  Physical Exam  Constitutional: He is oriented to person, place, and time. He appears well-developed and well-nourished. No distress.  HENT:  Head: Normocephalic and atraumatic.  Eyes: Conjunctivae and EOM are normal.  Neck: Neck supple. No tracheal deviation present.  Cardiovascular: Normal rate.   Pulmonary/Chest: Effort normal. No respiratory distress.  Abdominal: There is tenderness.  Tender to palpation in LUQ.   Musculoskeletal: Normal range of motion.  Neurological: He is alert and oriented to person, place, and time.  Skin: Skin is warm and dry.  Psychiatric: He has a normal mood and affect. His behavior is normal.  Nursing note and vitals reviewed.   ED Course  Procedures (including critical care time) DIAGNOSTIC STUDIES: Oxygen Saturation is 100% on RA, normal by my interpretation.    COORDINATION OF CARE: 11:15 PM-Discussed treatment plan with pt at bedside and pt agreed to plan.     Labs Review Labs Reviewed - No data to display  Imaging Review No results found.   EKG Interpretation None      MDM   Final diagnoses:  None      Patient is a 54 year old male who presents with left flank and LUQ pain.  He was diagnosed two days ago with an 42mm stone in the left UVJ.  His pain returned and is more severe.  He is nauseated  and his home meds have not helped.  He was given pain medication in the ER and is now feeling better. He will be discharged to home with follow-up information for [facility]. He is to call to arrange a follow-up appointment for early this week. His urine shows no evidence for infection. He is afebrile but does have a white count of 15.6. I suspect this is related to discomfort. His abdominal exam reveals mild tenderness in the left upper quadrant, however there are no peritoneal signs and I doubt an emergent process. He'll be advised to continue his pain medication and follow-up this week.   I personally performed the  services described in this documentation, which was scribed in my presence. The recorded information has been reviewed and is accurate.      Veryl Speak, MD 01/29/14 [phone]   I attempted to discharge this patient, however he stated that his pain is returned and it is worse than ever. He is in tears and sobbing. He was given additional morphine and his CT scan was repeated. This revealed little change in the position of the renal calculus. He was given a third dose of pain medicine and is feeling somewhat improved, however states that he is unable to return home. I've spoken with Dr. Shanon Brow from the hospitalist service who agrees to admit for pain control.  Veryl Speak, MD 01/29/14 [phone]

## 2014-01-28 NOTE — ED Notes (Signed)
Pt c/o severe left side flank pain; pt was here x 2 days ago and dx with a kidney stone

## 2014-01-29 ENCOUNTER — Emergency Department (HOSPITAL_COMMUNITY): Payer: 59

## 2014-01-29 ENCOUNTER — Encounter (HOSPITAL_COMMUNITY): Payer: Self-pay | Admitting: *Deleted

## 2014-01-29 DIAGNOSIS — F329 Major depressive disorder, single episode, unspecified: Secondary | ICD-10-CM

## 2014-01-29 DIAGNOSIS — I10 Essential (primary) hypertension: Secondary | ICD-10-CM

## 2014-01-29 DIAGNOSIS — R52 Pain, unspecified: Secondary | ICD-10-CM | POA: Diagnosis present

## 2014-01-29 DIAGNOSIS — E7800 Pure hypercholesterolemia, unspecified: Secondary | ICD-10-CM | POA: Diagnosis present

## 2014-01-29 DIAGNOSIS — N200 Calculus of kidney: Secondary | ICD-10-CM

## 2014-01-29 DIAGNOSIS — R1012 Left upper quadrant pain: Secondary | ICD-10-CM

## 2014-01-29 DIAGNOSIS — R109 Unspecified abdominal pain: Secondary | ICD-10-CM | POA: Diagnosis present

## 2014-01-29 DIAGNOSIS — N390 Urinary tract infection, site not specified: Secondary | ICD-10-CM | POA: Diagnosis present

## 2014-01-29 LAB — URINALYSIS, ROUTINE W REFLEX MICROSCOPIC
Bilirubin Urine: NEGATIVE
Glucose, UA: NEGATIVE mg/dL
KETONES UR: NEGATIVE mg/dL
LEUKOCYTES UA: NEGATIVE
Nitrite: NEGATIVE
Protein, ur: NEGATIVE mg/dL
UROBILINOGEN UA: 0.2 mg/dL (ref 0.0–1.0)
pH: 5.5 (ref 5.0–8.0)

## 2014-01-29 LAB — URINE MICROSCOPIC-ADD ON

## 2014-01-29 MED ORDER — SODIUM CHLORIDE 0.9 % IV SOLN: INTRAVENOUS | Status: DC

## 2014-01-29 MED ORDER — SODIUM CHLORIDE 0.9 % IV SOLN: INTRAVENOUS | Status: AC

## 2014-01-29 MED ORDER — MORPHINE SULFATE 4 MG/ML IJ SOLN
4.0000 mg | Freq: Once | INTRAMUSCULAR | Status: AC
  Administered 2014-01-29: 4 mg via INTRAVENOUS

## 2014-01-29 MED ORDER — MORPHINE SULFATE 4 MG/ML IJ SOLN
INTRAMUSCULAR | Status: AC
  Filled 2014-01-29: qty 1

## 2014-01-29 MED ORDER — ONDANSETRON HCL 4 MG/2ML IJ SOLN
4.0000 mg | Freq: Four times a day (QID) | INTRAMUSCULAR | Status: DC | PRN
  Administered 2014-01-29 – 2014-01-30 (×2): 4 mg via INTRAVENOUS
  Filled 2014-01-29 (×2): qty 2

## 2014-01-29 MED ORDER — HYDROMORPHONE HCL 1 MG/ML IJ SOLN: 1.0000 mg | INTRAMUSCULAR | Status: DC | PRN

## 2014-01-29 MED ORDER — CIPROFLOXACIN IN D5W 400 MG/200ML IV SOLN
400.0000 mg | Freq: Two times a day (BID) | INTRAVENOUS | Status: DC
  Administered 2014-01-29 – 2014-01-30 (×3): 400 mg via INTRAVENOUS
  Filled 2014-01-29 (×4): qty 200

## 2014-01-29 MED ORDER — ONDANSETRON HCL 4 MG/2ML IJ SOLN: 4.0000 mg | Freq: Three times a day (TID) | INTRAMUSCULAR | Status: DC | PRN

## 2014-01-29 MED ORDER — ALPRAZOLAM 1 MG PO TABS: 1.0000 mg | ORAL_TABLET | Freq: Every day | ORAL | Status: DC | PRN

## 2014-01-29 MED ORDER — OXYCODONE-ACETAMINOPHEN 5-325 MG PO TABS: 2.0000 | ORAL_TABLET | ORAL | Status: DC | PRN

## 2014-01-29 MED ORDER — PROMETHAZINE HCL 25 MG/ML IJ SOLN
12.5000 mg | Freq: Four times a day (QID) | INTRAMUSCULAR | Status: DC | PRN
  Administered 2014-01-29 (×2): 12.5 mg via INTRAVENOUS
  Filled 2014-01-29 (×3): qty 1

## 2014-01-29 MED ORDER — ALLOPURINOL 300 MG PO TABS
300.0000 mg | ORAL_TABLET | Freq: Every day | ORAL | Status: DC
  Administered 2014-01-29 – 2014-01-30 (×2): 300 mg via ORAL
  Filled 2014-01-29 (×2): qty 1

## 2014-01-29 MED ORDER — LISINOPRIL 10 MG PO TABS
10.0000 mg | ORAL_TABLET | Freq: Every day | ORAL | Status: DC
  Administered 2014-01-29 – 2014-01-30 (×2): 10 mg via ORAL
  Filled 2014-01-29 (×2): qty 1

## 2014-01-29 MED ORDER — SIMVASTATIN 40 MG PO TABS
40.0000 mg | ORAL_TABLET | Freq: Every evening | ORAL | Status: DC
  Filled 2014-01-29 (×2): qty 1

## 2014-01-29 MED ORDER — ATENOLOL 25 MG PO TABS
25.0000 mg | ORAL_TABLET | Freq: Every day | ORAL | Status: DC
  Administered 2014-01-29 – 2014-01-30 (×2): 25 mg via ORAL
  Filled 2014-01-29 (×2): qty 1

## 2014-01-29 MED ORDER — KETOROLAC TROMETHAMINE 15 MG/ML IJ SOLN
15.0000 mg | Freq: Three times a day (TID) | INTRAMUSCULAR | Status: DC
  Administered 2014-01-29 – 2014-01-30 (×2): 15 mg via INTRAVENOUS
  Filled 2014-01-29 (×3): qty 1

## 2014-01-29 MED ORDER — MORPHINE SULFATE 4 MG/ML IJ SOLN
4.0000 mg | Freq: Once | INTRAMUSCULAR | Status: AC
  Administered 2014-01-29: 4 mg via INTRAVENOUS
  Filled 2014-01-29: qty 1

## 2014-01-29 MED ORDER — HEPARIN SODIUM (PORCINE) 5000 UNIT/ML IJ SOLN
5000.0000 [IU] | INTRAMUSCULAR | Status: AC
  Administered 2014-01-30: 5000 [IU] via SUBCUTANEOUS
  Filled 2014-01-29: qty 1

## 2014-01-29 MED ORDER — ONDANSETRON HCL 4 MG PO TABS: 4.0000 mg | ORAL_TABLET | Freq: Four times a day (QID) | ORAL | Status: DC | PRN

## 2014-01-29 MED ORDER — HYDROMORPHONE HCL 1 MG/ML IJ SOLN
2.0000 mg | INTRAMUSCULAR | Status: DC | PRN
  Administered 2014-01-29 – 2014-01-30 (×5): 2 mg via INTRAVENOUS
  Filled 2014-01-29 (×5): qty 2

## 2014-01-29 MED ORDER — TAMSULOSIN HCL 0.4 MG PO CAPS
0.4000 mg | ORAL_CAPSULE | Freq: Every day | ORAL | Status: DC
  Administered 2014-01-29 – 2014-01-30 (×2): 0.4 mg via ORAL
  Filled 2014-01-29 (×2): qty 1

## 2014-01-29 NOTE — Progress Notes (Signed)
Report given to [facility]. 

## 2014-01-29 NOTE — Anesthesia Preprocedure Evaluation (Addendum)
Anesthesia Evaluation  Patient identified by MRN, date of birth, ID band Patient awake    Reviewed: Allergy & Precautions, NPO status , Patient's Chart, lab work & pertinent test results, reviewed documented beta blocker date and time   History of Anesthesia Complications (+) history of anesthetic complications  Airway Mallampati: II  TM Distance: >3 FB Neck ROM: Full    Dental no notable dental hx.    Pulmonary neg pulmonary ROS,  breath sounds clear to auscultation  Pulmonary exam normal       Cardiovascular hypertension, Pt. on medications and Pt. on home beta blockers Rhythm:Regular Rate:Normal  Stress echo 2014 No ischemia/wma, ef 65%   Neuro/Psych PSYCHIATRIC DISORDERS Depression negative neurological ROS     GI/Hepatic Neg liver ROS, hiatal hernia, GERD-  ,  Endo/Other  negative endocrine ROS  Renal/GU Renal disease     Musculoskeletal negative musculoskeletal ROS (+)   Abdominal   Peds  Hematology negative hematology ROS (+)   Anesthesia Other Findings   Reproductive/Obstetrics                            Anesthesia Physical Anesthesia Plan  ASA: II  Anesthesia Plan: General   Post-op Pain Management:    Induction: Intravenous  Airway Management Planned: LMA  Additional Equipment:   Intra-op Plan:   Post-operative Plan: Extubation in OR  Informed Consent: I have reviewed the patients History and Physical, chart, labs and discussed the procedure including the risks, benefits and alternatives for the proposed anesthesia with the patient or authorized representative who has indicated his/her understanding and acceptance.   Dental advisory given  Plan Discussed with: CRNA  Anesthesia Plan Comments:        Anesthesia Quick Evaluation

## 2014-01-29 NOTE — Progress Notes (Signed)
Patient being transferred to [facility] [facility].  Called report to [employer].  RN has no questions at this time.  Patient and family updated.  Will continue to monitor patient until transport with [facility].

## 2014-01-29 NOTE — Progress Notes (Signed)
Pt received to room 1413 via [facility] from [facility]. Report received from [facility] and from [facility] upon arrival to floor

## 2014-01-29 NOTE — H&P (Signed)
PCP:   Tawni Carnes, PA-C   Chief Complaint:  Pain left pain  HPI: 54 yo male with 2 days of left flank pain that has moved down to the left lower quadrant.  Came to ED yesterday was given percocet and flomax, pain seemed to improve but then got worse today.  No fevers.  No n/v/d.  He has received 3 round of morphine with still significant pain, asked to obs for pain control.   Review of Systems:  Positive and negative as per HPI otherwise all other systems are negative  Past Medical History: Past Medical History  Diagnosis Date  . Chest pain   . Shoulder pain   . Depression   . Erectile dysfunction   . GERD (gastroesophageal reflux disease)   . Hyperlipidemia   . HTN (hypertension)   . Insomnia   . Nephrolithiasis   . Dysuria   . Gastroenteritis   . Insomnia   . Onychomycosis   . Prostate cancer   . Basal cell carcinoma   . High cholesterol   . Gout    Past Surgical History  Procedure Laterality Date  . Prostatectomy      Medications: Prior to Admission medications   Medication Sig Start Date End Date Taking? Authorizing Provider  allopurinol (ZYLOPRIM) 300 MG tablet Take 300 mg by mouth daily.    Historical Provider, MD  ALPRAZolam Duanne Moron) 1 MG tablet Take 1 mg by mouth daily as needed for anxiety.  12/07/13   Historical Provider, MD  atenolol (TENORMIN) 25 MG tablet Take 25 mg by mouth daily.    Historical Provider, MD  colchicine 0.6 MG tablet Take 1 tablet (0.6 mg total) by mouth daily. Patient not taking: Reported on 01/26/2014 01/31/13   Tanna Furry, MD  ibuprofen (ADVIL,MOTRIN) 800 MG tablet Take 1 tablet (800 mg total) by mouth 3 (three) times daily. Patient not taking: Reported on 01/26/2014 09/18/13   Ezequiel Essex, MD  lisinopril (PRINIVIL,ZESTRIL) 10 MG tablet Take 10 mg by mouth daily.    Historical Provider, MD  omeprazole (PRILOSEC OTC) 20 MG tablet Take 20 mg by mouth daily.    Historical Provider, MD  ondansetron (ZOFRAN ODT) 4 MG disintegrating  tablet Take 1 tablet (4 mg total) by mouth every 8 (eight) hours as needed. 01/26/14   Fredia Sorrow, MD  ondansetron (ZOFRAN) 4 MG tablet Take 1 tablet (4 mg total) by mouth every 6 (six) hours. Patient not taking: Reported on 01/26/2014 09/18/13   Ezequiel Essex, MD  oxyCODONE-acetaminophen (PERCOCET) 5-325 MG per tablet Take 2 tablets by mouth every 4 (four) hours as needed. Patient not taking: Reported on 01/26/2014 06/02/13   Nat Christen, MD  oxyCODONE-acetaminophen (PERCOCET/ROXICET) 5-325 MG per tablet Take 2 tablets by mouth every 4 (four) hours as needed for severe pain. Patient not taking: Reported on 01/26/2014 09/18/13   Ezequiel Essex, MD  oxyCODONE-acetaminophen (PERCOCET/ROXICET) 5-325 MG per tablet Take 1-2 tablets by mouth every 6 (six) hours as needed for moderate pain or severe pain. 01/26/14   Fredia Sorrow, MD  simvastatin (ZOCOR) 40 MG tablet Take 40 mg by mouth every evening.    Historical Provider, MD  tamsulosin (FLOMAX) 0.4 MG CAPS capsule Take 1 capsule (0.4 mg total) by mouth daily. Patient not taking: Reported on 01/26/2014 09/18/13   Ezequiel Essex, MD  tamsulosin (FLOMAX) 0.4 MG CAPS capsule Take 1 capsule (0.4 mg total) by mouth daily. 01/26/14   Fredia Sorrow, MD    Allergies:  No Known Allergies  Social  History:  reports that he has never smoked. He does not have any smokeless tobacco history on file. He reports that he does not drink alcohol or use illicit drugs.  Family History: History reviewed. No pertinent family history.  Physical Exam: Filed Vitals:   01/28/14 2117 01/29/14 0104  BP: 163/91 140/81  Pulse: 78 69  Temp: 99.1 F (37.3 C)   TempSrc: Oral   Resp: 22 18  Height: 5\' 10"  (1.778 m)   Weight: 111.131 kg (245 lb)   SpO2: 100% 97%   General appearance: alert, cooperative and mild distress Head: Normocephalic, without obvious abnormality, atraumatic Eyes: negative Nose: Nares normal. Septum midline. Mucosa normal. No drainage or sinus  tenderness. Neck: no JVD and supple, symmetrical, trachea midline Lungs: clear to auscultation bilaterally Heart: regular rate and rhythm, S1, S2 normal, no murmur, click, rub or gallop Abdomen: soft, non-tender; bowel sounds normal; no masses,  no organomegaly Extremities: extremities normal, atraumatic, no cyanosis or edema Pulses: 2+ and symmetric Skin: Skin color, texture, turgor normal. No rashes or lesions Neurologic: Grossly normal   Labs on Admission:   Recent Labs  01/26/14 1535 01/28/14 2300  NA 134* 135  K 4.2 4.1  CL 104 105  CO2 24 24  GLUCOSE 105* 104*  BUN 17 14  CREATININE 1.07 1.26  CALCIUM 8.9 8.7    Recent Labs  01/26/14 1535 01/28/14 2300  WBC 12.3* 15.6*  NEUTROABS 10.8* 13.2*  HGB 13.8 13.4  HCT 41.7 40.7  MCV 90.1 90.4  PLT 272 256   Radiological Exams on Admission: Ct Renal Stone Study  01/29/2014   CLINICAL DATA:  LEFT flank pain, persistent symptoms from 2 days ago.  EXAM: CT ABDOMEN AND PELVIS WITHOUT CONTRAST  TECHNIQUE: Multidetector CT imaging of the abdomen and pelvis was performed following the standard protocol without IV contrast.  COMPARISON:  CT of the abdomen and pelvis January 26, 2014  FINDINGS: LUNG BASES: Included view of the lung bases are clear. The visualized heart and pericardium are unremarkable.  KIDNEYS/BLADDER: Kidneys are orthotopic, demonstrating normal size and morphology. Moderate LEFT hydroureteronephrosis to the level of the distal ureter where a 6 mm calculus is again seen at the ureterovesicular junction, unchanged. Punctate LEFT LEFT lower pole nephrolithiasis. Limited assessment for renal masses on this nonenhanced examination. Increasing LEFT periureteral inflammation. Urinary bladder is partially distended and unremarkable.  SOLID ORGANS: The liver, spleen, gallbladder, pancreas and adrenal glands are unremarkable for this non-contrast examination.  GASTROINTESTINAL TRACT: The stomach, small and large bowel are normal  in course and caliber without inflammatory changes, the sensitivity may be decreased by lack of enteric contrast. Subcentimeter appendicolith without CT findings of acute appendicitis. Mild rectal wall thickening may reflect internal hemorrhage or sequelae of prostate treatment.  PERITONEUM/RETROPERITONEUM: No intraperitoneal free fluid nor free air. Aortoiliac vessels are normal in course and caliber. No lymphadenopathy by CT size criteria. Prostate appears surgically absent. 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  SOFT TISSUES/ OSSEOUS STRUCTURES: Nonsuspicious. Small fat containing inguinal hernias. Bridging sacroiliac osteophytes. Moderate degenerative change of the lumbar spine.  IMPRESSION: Similar moderate LEFT hydroureteronephrosis with 6 mm obstructing calculus at ureterovesicular junction. Increasing LEFT periureteral inflammation may reflect urinary tract infection. Residual punctate LEFT lower pole nephrolithiasis.   Electronically Signed   By: Elon Alas   On: 01/29/2014 02:37    Assessment/Plan  54 yo male with left kidney stone and uncontrolled pain  Principal Problem:   Acute left flank pain-  Ivf.  Try dilaudid, zofran.  Cont flomax.  Sounds like its moving along.  Can f/u with urology as outpt unless worsens, develops fever.    Active Problems:  Stable unless o/w noted   Depression   Nephrolithiasis   High cholesterol   HTN (hypertension)  obs on med.  Full code.  DAVID,RACHAL A 01/29/2014, 4:03 AM

## 2014-01-29 NOTE — H&P (Signed)
David Hartman is an 54 y.o. male.   Chief Complaint: Ongoing significant left flank and abdominal pain secondary to a 6 mm left distal ureteral stone HPI: David Hartman is 54 years of age and has a long standing history of nephrolithiasis. He reports that 15-20 years ago he had several episodes of acute conical nephrolithiasis always spontaneous passage. He previously was seen and evaluated by Dr. Terance Hart, Jeffie Pollock and Perkins. The patient reports he subsequently stopped forming stones for about 15-20 years. Approximately 3 years ago he was diagnosed with adenocarcinoma the prostate by a outside urologist in [place]. He subsequently underwent robotic surgery in [facility] by Dr. Butch Penny. His pathology was apparently favorable. His PSA has continued to be undetectable. He has no real voiding complaints/incontinence and has done well with that diagnosis. He tells me approximately 18 months ago he began experiencing recurrent stones all of which have passed spontaneously. He estimates 4-5 stones over the last 18 months. He reports that previous stones were calcium oxalate but apparently recently his primary care provider had a stone analyzed and it was uric acid. He does suffer from gout. He presented to the [facility] emergency room approximate 4 days ago with severe left flank pain was diagnosed with a 6 mm distal left ureteral stone. He was sent home but then returned yesterday having excruciating pain. He was admitted to the [facility]. I was contacted this afternoon around noon with a request to transfer him to [facility] for definitive urology management and we accepted the patient in transfer. He just recently presented to the floor. His pain is currently mild to moderate. He has had some recent hydromorphone.   The patient has been afebrile. He did have a late lunch. He is currently nothing by mouth.  Past Medical History  Diagnosis Date  . Chest pain   . Shoulder pain   . Depression    . Erectile dysfunction   . GERD (gastroesophageal reflux disease)   . Hyperlipidemia   . HTN (hypertension)   . Insomnia   . Nephrolithiasis   . Dysuria   . Gastroenteritis   . Insomnia   . Onychomycosis   . Prostate cancer   . Basal cell carcinoma   . High cholesterol   . Gout     Past Surgical History  Procedure Laterality Date  . Prostatectomy      History reviewed. No pertinent family history. Social History:  reports that he has never smoked. He does not have any smokeless tobacco history on file. He reports that he does not drink alcohol or use illicit drugs.  Allergies: No Known Allergies  Medications Prior to Admission  Medication Sig Dispense Refill  . allopurinol (ZYLOPRIM) 300 MG tablet Take 300 mg by mouth daily.    Marland Kitchen ALPRAZolam (XANAX) 1 MG tablet Take 1 mg by mouth daily as needed for anxiety.     Marland Kitchen atenolol (TENORMIN) 25 MG tablet Take 25 mg by mouth daily.    Marland Kitchen lisinopril (PRINIVIL,ZESTRIL) 10 MG tablet Take 10 mg by mouth daily.    Marland Kitchen omeprazole (PRILOSEC OTC) 20 MG tablet Take 20 mg by mouth daily.    . ondansetron (ZOFRAN ODT) 4 MG disintegrating tablet Take 1 tablet (4 mg total) by mouth every 8 (eight) hours as needed. (Patient taking differently: Take 4 mg by mouth every 8 (eight) hours as needed for nausea. ) 10 tablet 1  . simvastatin (ZOCOR) 40 MG tablet Take 40 mg by mouth every  evening.    . tamsulosin (FLOMAX) 0.4 MG CAPS capsule Take 1 capsule (0.4 mg total) by mouth daily. 7 capsule 0  . oxyCODONE-acetaminophen (PERCOCET/ROXICET) 5-325 MG per tablet Take 1-2 tablets by mouth every 6 (six) hours as needed for moderate pain or severe pain. (Patient not taking: Reported on 01/29/2014) 20 tablet 0    Results for orders placed or performed during the hospital encounter of 01/28/14 (from the past 48 hour(s))  Urinalysis, Routine w reflex microscopic     Status: Abnormal   Collection Time: 01/28/14 11:00 PM  Result Value Ref Range   Color, Urine  YELLOW YELLOW   APPearance CLEAR CLEAR   Specific Gravity, Urine <1.005 (L) 1.005 - 1.030   pH 5.5 5.0 - 8.0   Glucose, UA NEGATIVE NEGATIVE mg/dL   Hgb urine dipstick MODERATE (A) NEGATIVE   Bilirubin Urine NEGATIVE NEGATIVE   Ketones, ur NEGATIVE NEGATIVE mg/dL   Protein, ur NEGATIVE NEGATIVE mg/dL   Urobilinogen, UA 0.2 0.0 - 1.0 mg/dL   Nitrite NEGATIVE NEGATIVE   Leukocytes, UA NEGATIVE NEGATIVE  Basic metabolic panel     Status: Abnormal   Collection Time: 01/28/14 11:00 PM  Result Value Ref Range   Sodium 135 135 - 145 mmol/L    Comment: Please note change in reference range.   Potassium 4.1 3.5 - 5.1 mmol/L    Comment: Please note change in reference range.   Chloride 105 96 - 112 mEq/L   CO2 24 19 - 32 mmol/L   Glucose, Bld 104 (H) 70 - 99 mg/dL   BUN 14 6 - 23 mg/dL   Creatinine, Ser 1.26 0.50 - 1.35 mg/dL   Calcium 8.7 8.4 - 10.5 mg/dL   GFR calc non Af Amer 64 (L) >90 mL/min   GFR calc Af Amer 74 (L) >90 mL/min    Comment: (NOTE) The eGFR has been calculated using the CKD EPI equation. This calculation has not been validated in all clinical situations. eGFR's persistently <90 mL/min signify possible Chronic Kidney Disease.    Anion gap 6 5 - 15  CBC with Differential     Status: Abnormal   Collection Time: 01/28/14 11:00 PM  Result Value Ref Range   WBC 15.6 (H) 4.0 - 10.5 K/uL   RBC 4.50 4.22 - 5.81 MIL/uL   Hemoglobin 13.4 13.0 - 17.0 g/dL   HCT 40.7 39.0 - 52.0 %   MCV 90.4 78.0 - 100.0 fL   MCH 29.8 26.0 - 34.0 pg   MCHC 32.9 30.0 - 36.0 g/dL   RDW 12.6 11.5 - 15.5 %   Platelets 256 150 - 400 K/uL   Neutrophils Relative % 84 (H) 43 - 77 %   Neutro Abs 13.2 (H) 1.7 - 7.7 K/uL   Lymphocytes Relative 9 (L) 12 - 46 %   Lymphs Abs 1.4 0.7 - 4.0 K/uL   Monocytes Relative 6 3 - 12 %   Monocytes Absolute 0.9 0.1 - 1.0 K/uL   Eosinophils Relative 1 0 - 5 %   Eosinophils Absolute 0.1 0.0 - 0.7 K/uL   Basophils Relative 0 0 - 1 %   Basophils Absolute 0.0  0.0 - 0.1 K/uL  Urine microscopic-add on     Status: None   Collection Time: 01/28/14 11:00 PM  Result Value Ref Range   Squamous Epithelial / LPF RARE RARE   WBC, UA 0-2 <3 WBC/hpf   RBC / HPF 0-2 <3 RBC/hpf   Bacteria, UA RARE RARE  Ct Renal Stone Study  01/29/2014   CLINICAL DATA:  LEFT flank pain, persistent symptoms from 2 days ago.  EXAM: CT ABDOMEN AND PELVIS WITHOUT CONTRAST  TECHNIQUE: Multidetector CT imaging of the abdomen and pelvis was performed following the standard protocol without IV contrast.  COMPARISON:  CT of the abdomen and pelvis January 26, 2014  FINDINGS: LUNG BASES: Included view of the lung bases are clear. The visualized heart and pericardium are unremarkable.  KIDNEYS/BLADDER: Kidneys are orthotopic, demonstrating normal size and morphology. Moderate LEFT hydroureteronephrosis to the level of the distal ureter where a 6 mm calculus is again seen at the ureterovesicular junction, unchanged. Punctate LEFT LEFT lower pole nephrolithiasis. Limited assessment for renal masses on this nonenhanced examination. Increasing LEFT periureteral inflammation. Urinary bladder is partially distended and unremarkable.  SOLID ORGANS: The liver, spleen, gallbladder, pancreas and adrenal glands are unremarkable for this non-contrast examination.  GASTROINTESTINAL TRACT: The stomach, small and large bowel are normal in course and caliber without inflammatory changes, the sensitivity may be decreased by lack of enteric contrast. Subcentimeter appendicolith without CT findings of acute appendicitis. Mild rectal wall thickening may reflect internal hemorrhage or sequelae of prostate treatment.  PERITONEUM/RETROPERITONEUM: No intraperitoneal free fluid nor free air. Aortoiliac vessels are normal in course and caliber. No lymphadenopathy by CT size criteria. Prostate appears surgically absent. 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  SOFT TISSUES/ OSSEOUS STRUCTURES: Nonsuspicious. Small fat containing inguinal hernias. Bridging sacroiliac osteophytes. Moderate degenerative change of the lumbar spine.  IMPRESSION: Similar moderate LEFT hydroureteronephrosis with 6 mm obstructing calculus at ureterovesicular junction. Increasing LEFT periureteral inflammation may reflect urinary tract infection. Residual punctate LEFT lower pole nephrolithiasis.   Electronically Signed   By: Elon Alas   On: 01/29/2014 02:37    Review of Systems - Negative except left sided lower abdominal discomfort with left-sided flank and back pain. Patient also has nausea and does have some current fatigue. No fever or chills. He's had some mild bladder pressure but no dysuria. No incontinence. No gross hematuria. View of systems otherwise negative.  Blood pressure 132/68, pulse 62, temperature 98.1 F (36.7 C), temperature source Oral, resp. rate 18, height $RemoveBe'5\' 10"'tOwsiIJiT$  (1.778 m), weight 111.131 kg (245 lb), SpO2 99 %. General appearance: alert, cooperative and no distress Neck: No gross masses. Resp: clear to auscultation bilaterally Cardio: regular rate and rhythm GI: soft, non-tender; bowel sounds normal; no masses,  no organomegaly no CVA tenderness Male genitalia: normal, penis: no lesions or discharge. testes: no masses or tenderness. no hernias Pelvic: external genitalia normal Extremities: extremities normal, atraumatic, no cyanosis or edema Skin: Skin color, texture, turgor normal. No rashes or lesions Neurologic: Grossly normal  Assessment/Plan 6 mm distal left ureteral calculus. The patient presented to the emergency room was discharged and then readmitted to the hospital. I do believe definitive management as indicated and he would like to be definitively managed and not discharged. Because the patient did have lunch and because it is a weekend night I do believe it would be in his best interest to delay any  significant definitive surgery until tomorrow. We will keep the patient comfortable and add Toradol to his regimen. We will continue with supportive care and attempt to get him added to the surgical schedule for tomorrow. The procedure of ureteroscopy was discussed with the patient. The risks and benefits of that surgery were discussed. That is the best treatment given what  is probably a distal uric acid stone. He will require holmium laser lithotripsy with probable stent placement.  Kessie Croston S 01/29/2014, 5:42 PM

## 2014-01-29 NOTE — ED Notes (Addendum)
Pt requesting to be admitted. Pt is scared his pain is going to return and his home meds are not working.

## 2014-01-29 NOTE — Progress Notes (Signed)
Patient left unit in stable condition with [facility] to be transferred to [facility].

## 2014-01-29 NOTE — Progress Notes (Signed)
The patient is a 54 year old man with a history of prostate cancer, erectile dysfunction, and hypertension, who was admitted this morning by hospitalist, Dr. Shanon Brow for recurrent/persistent left-sided flank pain. The patient was seen and evaluated in the ED on 01/26/2014 for the same. He was discharged from the ED with a prescription for Percocet and Flomax and was instructed to call urology for an outpatient follow-up. In the interim, the patient continued to have progressive pain. The patient was briefly seen and examined. His chart, vital signs, laboratory studies were reviewed. He is still in quite a bit of pain, but does acknowledge that his pain subsides when he receives IV hydromorphone. He also ate a small amount at lunch.   Plan: 1. See Dr. Camelia Eng previous assessment and plan. 2. Will add IV Cipro empirically for possible urinary tract infection given that there was radiographic evidence of increasing left periureteral inflammation. We'll culture the urine. 3. I discussed the patient with urologist on call at [facility], Dr. Risa Grill. He graciously agreed to accept the patient in transfer for further management. I will make the patient nothing by mouth with exception of sips with his medications. 4. The patient will be transferred to Dr. Cy Blamer service at [facility] today. This was discussed with the patient who agreed.

## 2014-01-30 ENCOUNTER — Encounter (HOSPITAL_COMMUNITY)
Admission: EM | Disposition: A | Discharge status: Home / Self Care | Payer: Self-pay | Source: Home / Self Care | Attending: Emergency Medicine

## 2014-01-30 ENCOUNTER — Other Ambulatory Visit: Payer: Self-pay

## 2014-01-30 ENCOUNTER — Inpatient Hospital Stay (HOSPITAL_COMMUNITY): Payer: 59 | Admitting: Anesthesiology

## 2014-01-30 DIAGNOSIS — N201 Calculus of ureter: Secondary | ICD-10-CM

## 2014-01-30 HISTORY — PX: HOLMIUM LASER APPLICATION: SHX5852

## 2014-01-30 HISTORY — PX: CYSTOSCOPY W/ URETERAL STENT PLACEMENT: SHX1429

## 2014-01-30 LAB — SURGICAL PCR SCREEN
MRSA, PCR: NEGATIVE
Staphylococcus aureus: NEGATIVE

## 2014-01-30 LAB — URINE CULTURE
COLONY COUNT: NO GROWTH
Culture: NO GROWTH

## 2014-01-30 SURGERY — CYSTOSCOPY, WITH RETROGRADE PYELOGRAM AND URETERAL STENT INSERTION
Anesthesia: General | Laterality: Left

## 2014-01-30 MED ORDER — ONDANSETRON HCL 4 MG/2ML IJ SOLN
INTRAMUSCULAR | Status: DC | PRN
  Administered 2014-01-30: 4 mg via INTRAVENOUS

## 2014-01-30 MED ORDER — PROPOFOL 10 MG/ML IV BOLUS
INTRAVENOUS | Status: AC
  Filled 2014-01-30: qty 20

## 2014-01-30 MED ORDER — FENTANYL CITRATE 0.05 MG/ML IJ SOLN
INTRAMUSCULAR | Status: AC
  Filled 2014-01-30: qty 5

## 2014-01-30 MED ORDER — DEXAMETHASONE SODIUM PHOSPHATE 10 MG/ML IJ SOLN
INTRAMUSCULAR | Status: AC
  Filled 2014-01-30: qty 1

## 2014-01-30 MED ORDER — EPHEDRINE SULFATE 50 MG/ML IJ SOLN
INTRAMUSCULAR | Status: AC
  Filled 2014-01-30: qty 1

## 2014-01-30 MED ORDER — MIDAZOLAM HCL 5 MG/5ML IJ SOLN
INTRAMUSCULAR | Status: DC | PRN
  Administered 2014-01-30: 2 mg via INTRAVENOUS

## 2014-01-30 MED ORDER — LIDOCAINE HCL (CARDIAC) 20 MG/ML IV SOLN
INTRAVENOUS | Status: DC | PRN
  Administered 2014-01-30: 50 mg via INTRAVENOUS

## 2014-01-30 MED ORDER — IOHEXOL 300 MG/ML  SOLN
INTRAMUSCULAR | Status: DC | PRN
  Administered 2014-01-30: 5 mL via URETHRAL

## 2014-01-30 MED ORDER — SODIUM CHLORIDE 0.9 % IJ SOLN
INTRAMUSCULAR | Status: AC
  Filled 2014-01-30: qty 10

## 2014-01-30 MED ORDER — MIDAZOLAM HCL 2 MG/2ML IJ SOLN
INTRAMUSCULAR | Status: AC
  Filled 2014-01-30: qty 2

## 2014-01-30 MED ORDER — ATROPINE SULFATE 0.4 MG/ML IJ SOLN
INTRAMUSCULAR | Status: AC
  Filled 2014-01-30: qty 2

## 2014-01-30 MED ORDER — LACTATED RINGERS IV SOLN
INTRAVENOUS | Status: DC | PRN
  Administered 2014-01-30: 07:00:00 via INTRAVENOUS

## 2014-01-30 MED ORDER — SODIUM CHLORIDE 0.9 % IR SOLN
Status: DC | PRN
  Administered 2014-01-30: 4000 mL

## 2014-01-30 MED ORDER — HYDROMORPHONE HCL 1 MG/ML IJ SOLN
INTRAMUSCULAR | Status: AC
  Filled 2014-01-30: qty 1

## 2014-01-30 MED ORDER — ONDANSETRON HCL 4 MG/2ML IJ SOLN
INTRAMUSCULAR | Status: AC
  Filled 2014-01-30: qty 2

## 2014-01-30 MED ORDER — URIBEL 118 MG PO CAPS: 1.0000 | ORAL_CAPSULE | Freq: Three times a day (TID) | ORAL | Status: DC | PRN

## 2014-01-30 MED ORDER — FENTANYL CITRATE 0.05 MG/ML IJ SOLN
INTRAMUSCULAR | Status: DC | PRN
  Administered 2014-01-30: 25 ug via INTRAVENOUS
  Administered 2014-01-30: 50 ug via INTRAVENOUS

## 2014-01-30 MED ORDER — LACTATED RINGERS IV SOLN: INTRAVENOUS | Status: DC

## 2014-01-30 MED ORDER — DEXAMETHASONE SODIUM PHOSPHATE 10 MG/ML IJ SOLN
INTRAMUSCULAR | Status: DC | PRN
Start: 2014-01-30 — End: 2014-01-30
  Administered 2014-01-30: 10 mg via INTRAVENOUS

## 2014-01-30 MED ORDER — PROPOFOL 10 MG/ML IV BOLUS
INTRAVENOUS | Status: DC | PRN
  Administered 2014-01-30: 250 mg via INTRAVENOUS

## 2014-01-30 MED ORDER — LIDOCAINE HCL 2 % EX GEL
CUTANEOUS | Status: DC | PRN
  Administered 2014-01-30: 1 via URETHRAL

## 2014-01-30 MED ORDER — LIDOCAINE HCL (CARDIAC) 20 MG/ML IV SOLN
INTRAVENOUS | Status: AC
  Filled 2014-01-30: qty 5

## 2014-01-30 MED ORDER — HYDROMORPHONE HCL 1 MG/ML IJ SOLN
0.2500 mg | INTRAMUSCULAR | Status: DC | PRN
  Administered 2014-01-30 (×5): 0.5 mg via INTRAVENOUS
  Filled 2014-01-30: qty 1

## 2014-01-30 MED ORDER — MEPERIDINE HCL 25 MG/ML IJ SOLN
6.2500 mg | INTRAMUSCULAR | Status: DC | PRN
  Filled 2014-01-30: qty 1

## 2014-01-30 MED ORDER — LIDOCAINE HCL 2 % EX GEL
CUTANEOUS | Status: AC
  Filled 2014-01-30: qty 10

## 2014-01-30 MED ORDER — PROMETHAZINE HCL 25 MG/ML IJ SOLN: 6.2500 mg | INTRAMUSCULAR | Status: DC | PRN

## 2014-01-30 SURGICAL SUPPLY — 13 items
BASKET ZERO TIP NITINOL 2.4FR (BASKET) ×1 IMPLANT
BSKT STON RTRVL ZERO TP 2.4FR (BASKET) ×1
CATH INTERMIT  6FR 70CM (CATHETERS) ×1 IMPLANT
DRAPE CAMERA CLOSED 9X96 (DRAPES) ×1 IMPLANT
FIBER LASER FLEXIVA 1000 (UROLOGICAL SUPPLIES) ×1 IMPLANT
FIBER LASER FLEXIVA 200 (UROLOGICAL SUPPLIES) ×1 IMPLANT
FIBER LASER FLEXIVA 365 (UROLOGICAL SUPPLIES) ×2 IMPLANT
FIBER LASER FLEXIVA 550 (UROLOGICAL SUPPLIES) ×1 IMPLANT
FIBER LASER TRAC TIP (UROLOGICAL SUPPLIES) ×1 IMPLANT
GUIDEWIRE STR DUAL SENSOR (WIRE) ×1 IMPLANT
LEGGING LITHOTOMY PAIR STRL (DRAPES) ×1 IMPLANT
PACK CYSTO (CUSTOM PROCEDURE TRAY) ×1 IMPLANT
STENT CONTOUR 6FRX24X.038 (STENTS) ×1 IMPLANT

## 2014-01-30 NOTE — Transfer of Care (Signed)
Immediate Anesthesia Transfer of Care Note  Patient: David Hartman  Procedure(s) Performed: Procedure(s): CYSTOSCOPY WITH RETROGRADE PYELOGRAM/URETEROSCOPY/URETERAL STENT PLACEMENT (Left) HOLMIUM LASER APPLICATION (Left)  Patient Location: PACU  Anesthesia Type:General  Level of Consciousness: awake, alert , oriented and patient cooperative  Airway & Oxygen Therapy: Patient Spontanous Breathing and Patient connected to face mask oxygen  Post-op Assessment: Report given to PACU RN, Post -op Vital signs reviewed and stable and Patient moving all extremities  Post vital signs: Reviewed and stable  Complications: No apparent anesthesia complications

## 2014-01-30 NOTE — Anesthesia Postprocedure Evaluation (Signed)
Anesthesia Post Note  Patient: David Hartman  Procedure(s) Performed: Procedure(s) (LRB): CYSTOSCOPY WITH RETROGRADE PYELOGRAM/URETEROSCOPY/URETERAL STENT PLACEMENT (Left) HOLMIUM LASER APPLICATION (Left)  Anesthesia type: General  Patient location: PACU  Post pain: Pain level controlled  Post assessment: Post-op Vital signs reviewed  Last Vitals: BP 133/67 mmHg  Pulse 81  Temp(Src) 36.9 C (Oral)  Resp 16  Ht 5\' 10"  (1.778 m)  Wt 245 lb (111.131 kg)  BMI 35.15 kg/m2  SpO2 97%  Post vital signs: Reviewed  Level of consciousness: sedated  Complications: No apparent anesthesia complications

## 2014-01-30 NOTE — Care Management Note (Signed)
    Page 1 of 1   01/30/2014     3:06:03 PM CARE MANAGEMENT NOTE 01/30/2014  Patient:  David Hartman, David Hartman   Account Number:  [id number]  Date Initiated:  01/30/2014  Documentation initiated by:  Dessa Phi  Subjective/Objective Assessment:   54 y/o m admitted w/l flank pain.     Action/Plan:   from home.   Anticipated DC Date:  01/30/2014   Anticipated DC Plan:  [place]  CM consult      Choice offered to / List presented to:             Status of service:  Completed, signed off Medicare Important Message given?   (If response is "NO", the following Medicare IM given date fields will be blank) Date Medicare IM given:   Medicare IM given by:   Date Additional Medicare IM given:   Additional Medicare IM given by:    Discharge Disposition:  HOME/SELF CARE  Per UR Regulation:  Reviewed for med. necessity/level of care/duration of stay  If discussed at [place] of Stay Meetings, dates discussed:    Comments:  01/30/14 Dessa Phi RN BSN NCM [phone] no d/c needs or orders.

## 2014-01-30 NOTE — Op Note (Signed)
Preoperative diagnosis: left ureteral calculus  Postoperative diagnosis: left  ureteral calculus  Procedure:  1. Cystoscopy 2. left  ureteroscopy and stone removal 3. Ureteroscopic laser lithotripsy 4. left  ureteral stent placement (24cm) 51F 5. left  retrograde pyelography with interpretation  Surgeon: Bernestine Amass, MD  Anesthesia: General  Complications: None  Intraoperative findings: left  retrograde pyelography demonstrated a filling defect within the left  ureter consistent with the patient's known calculus without other abnormalities.  EBL: Minimal  Specimens: 1. left  ureteral calculus  Disposition of specimens: [facility] for stone analysis  Indication: Case David Hartman is a 54 y.o.   patient with urolithiasis. After reviewing the management options for treatment, the patient elected to proceed with the above surgical procedure(s). We have discussed the potential benefits and risks of the procedure, side effects of the proposed treatment, the likelihood of the patient achieving the goals of the procedure, and any potential problems that might occur during the procedure or recuperation. Informed consent has been obtained.  Description of procedure:  The patient was taken to the operating room and general anesthesia was induced.  The patient was placed in the dorsal lithotomy position, prepped and draped in the usual sterile fashion, and preoperative antibiotics were administered. A preoperative time-out was performed.   Cystourethroscopy was performed.  The patient's urethra was examined and was normal prostate was absent status post previous prostatectomy. The bladder was then systematically examined in its entirety. There was no evidence for any bladder tumors, stones, or other mucosal pathology.    Attention then turned to the left  ureteral orifice and a ureteral catheter was used to intubate the ureteral orifice.  Omnipaque contrast was injected  through the ureteral catheter and a retrograde pyelogram was performed with findings as dictated above.  A 0.38 sensor guidewire was then advanced up the left  ureter into the renal pelvis under fluoroscopic guidance. The 6 Fr semirigid ureteroscope was then advanced into the ureter next to the guidewire and the calculus was identified.   The stone was then fragmented with the 365 micron holmium laser fiber on a setting of 0.8J and frequency of 5 Hz.   All stones were then removed from the ureter with a zero tip nitinol basket.  Reinspection of the ureter revealed no remaining visible stones or fragments.   The wire was then backloaded through the cystoscope and a ureteral stent was advance over the wire using Seldinger technique.  The stent was positioned appropriately under fluoroscopic and cystoscopic guidance.  The wire was then removed with an adequate stent curl noted in the renal pelvis as well as in the bladder. Dangle string was attached to the patient's penis.  The bladder was then emptied and the procedure ended.  The patient appeared to tolerate the procedure well and without complications.  The patient was able to be awakened and transferred to the recovery unit in satisfactory condition.

## 2014-01-30 NOTE — Discharge Instructions (Signed)
Continue your medication as needed for pain.  Call [facility] on Monday to arrange a follow-up appointment. The contact information has been provided in this discharge summary.  Return to the ER if you develop worsening pain, high fever, or other new and concerning symptoms.   Flank Pain Flank pain refers to pain that is located on the side of the body between the upper abdomen and the back. The pain may occur over a short period of time (acute) or may be long-term or reoccurring (chronic). It may be mild or severe. Flank pain can be caused by many things. CAUSES  Some of the more common causes of flank pain include:  Muscle strains.   Muscle spasms.   A disease of your spine (vertebral disk disease).   A lung infection (pneumonia).   Fluid around your lungs (pulmonary edema).   A kidney infection.   Kidney stones.   A very painful skin rash caused by the chickenpox virus (shingles).   Gallbladder disease.  [place] care will depend on the cause of your pain. In general,  Rest as directed by your caregiver.  Drink enough fluids to keep your urine clear or pale yellow.  Only take over-the-counter or prescription medicines as directed by your caregiver. Some medicines may help relieve the pain.  Tell your caregiver about any changes in your pain.  Follow up with your caregiver as directed. SEEK IMMEDIATE MEDICAL CARE IF:   Your pain is not controlled with medicine.   You have new or worsening symptoms.  Your pain increases.   You have abdominal pain.   You have shortness of breath.   You have persistent nausea or vomiting.   You have swelling in your abdomen.   You feel faint or pass out.   You have blood in your urine.  You have a fever or persistent symptoms for more than 2-3 days.  You have a fever and your symptoms suddenly get worse. MAKE SURE YOU:   Understand these instructions.  Will watch your  condition.  Will get help right away if you are not doing well or get worse. Document Released: 02/27/2005 Document Revised: 10/01/2011 Document Reviewed: 08/21/2011 [facility] Patient Information 2015 [place], [place]. This information is not intended to replace advice given to you by your health care provider. Make sure you discuss any questions you have with your health care provider.  Kidney Stones Kidney stones (urolithiasis) are deposits that form inside your kidneys. The intense pain is caused by the stone moving through the urinary tract. When the stone moves, the ureter goes into spasm around the stone. The stone is usually passed in the urine.  CAUSES   A disorder that makes certain neck glands produce too much parathyroid hormone (primary hyperparathyroidism).  A buildup of uric acid crystals, similar to gout in your joints.  Narrowing (stricture) of the ureter.  A kidney obstruction present at birth (congenital obstruction).  Previous surgery on the kidney or ureters.  Numerous kidney infections. SYMPTOMS   Feeling sick to your stomach (nauseous).  Throwing up (vomiting).  Blood in the urine (hematuria).  Pain that usually spreads (radiates) to the groin.  Frequency or urgency of urination. DIAGNOSIS   Taking a history and physical exam.  Blood or urine tests.  CT scan.  Occasionally, an examination of the inside of the urinary bladder (cystoscopy) is performed. TREATMENT   Observation.  Increasing your fluid intake.  Extracorporeal shock wave lithotripsy--This is a noninvasive procedure  that uses shock waves to break up kidney stones.  Surgery may be needed if you have severe pain or persistent obstruction. There are various surgical procedures. Most of the procedures are performed with the use of small instruments. Only small incisions are needed to accommodate these instruments, so recovery time is minimized. The size, location, and chemical composition  are all important variables that will determine the proper choice of action for you. Talk to your health care provider to better understand your situation so that you will minimize the risk of injury to yourself and your kidney.  HOME CARE INSTRUCTIONS   Drink enough water and fluids to keep your urine clear or pale yellow. This will help you to pass the stone or stone fragments.  Strain all urine through the provided strainer. Keep all particulate matter and stones for your health care provider to see. The stone causing the pain may be as small as a grain of salt. It is very important to use the strainer each and every time you pass your urine. The collection of your stone will allow your health care provider to analyze it and verify that a stone has actually passed. The stone analysis will often identify what you can do to reduce the incidence of recurrences.  Only take over-the-counter or prescription medicines for pain, discomfort, or fever as directed by your health care provider.  Make a follow-up appointment with your health care provider as directed.  Get follow-up X-rays if required. The absence of pain does not always mean that the stone has passed. It may have only stopped moving. If the urine remains completely obstructed, it can cause loss of kidney function or even complete destruction of the kidney. It is your responsibility to make sure X-rays and follow-ups are completed. Ultrasounds of the kidney can show blockages and the status of the kidney. Ultrasounds are not associated with any radiation and can be performed easily in a matter of minutes. SEEK MEDICAL CARE IF:  You experience pain that is progressive and unresponsive to any pain medicine you have been prescribed. SEEK IMMEDIATE MEDICAL CARE IF:   Pain cannot be controlled with the prescribed medicine.  You have a fever or shaking chills.  The severity or intensity of pain increases over 18 hours and is not relieved by  pain medicine.  You develop a new onset of abdominal pain.  You feel faint or pass out.  You are unable to urinate. MAKE SURE YOU:   Understand these instructions.  Will watch your condition.  Will get help right away if you are not doing well or get worse. Document Released: 01/06/2005 Document Revised: 09/08/2012 Document Reviewed: 06/09/2012 [facility] Patient Information 2015 [place], [place]. This information is not intended to replace advice given to you by your health care provider. Make sure you discuss any questions you have with your health care provider. DISCHARGE INSTRUCTIONS FOR KIDNEY STONES OR URETERAL STENT  MEDICATIONS:   1. DO NOT RESUME YOUR ASPIRIN, or any other medicines like ibuprofen, motrin, excedrin, advil, aleve, vitamin E, fish oil as these can all cause bleeding x 7 days.  2. Resume all your other meds from home - except do not take any other pain meds that you may have at home.  ACTIVITY 1. No strenuous activity x 1week 2. No driving while on narcotic pain medications 3. Drink plenty of water 4. Continue to walk at home - you can still get blood clots when you are at home, so keep active,  but don't over do it. 5. May return to work in 3 days.  BATHING 1. You can shower and we recommend daily showers  2. If you have a string coming from your urethra:  The stent string is attached to your ureteral stent.  Do not pull on this.   SIGNS/SYMPTOMS TO CALL: 1. Please call us if you have a fever greater than 101.5, uncontrolled  nausea/vomiting, uncontrolled pain, dizziness, unable to urinate, bloody urine, chest pain, shortness of breath, leg swelling, leg pain, redness around wound, drainage from wound, or any other concerns or questions.  You can reach [place] at [phone].  FOLLOW-UP 1. You will be called with a follow-up appointment 2. If you have a string attached to your stent, you may remove it on Thursday morning.  To do this, pull the string until  the stent is completely removed.  You may feel an odd sensation in your back. 3. If you do not feel comfortable removing the stent we would be happy to have one of the nurses in the office do that for you 4.  5.

## 2014-01-30 NOTE — Interval H&P Note (Signed)
History and Physical Interval Note:  01/30/2014 7:22 AM  David Hartman  has presented today for surgery, with the diagnosis of left stone  The various methods of treatment have been discussed with the patient and family. After consideration of risks, benefits and other options for treatment, the patient has consented to  Procedure(s): CYSTOSCOPY WITH RETROGRADE PYELOGRAM/URETEROSCOPY/URETERAL STENT PLACEMENT (Left) HOLMIUM LASER APPLICATION (Left) as a surgical intervention .  The patient's history has been reviewed, patient examined, no change in status, stable for surgery.  I have reviewed the patient's chart and labs.  Questions were answered to the patient's satisfaction.     Makari Portman S

## 2014-01-31 ENCOUNTER — Encounter (HOSPITAL_COMMUNITY): Payer: Self-pay | Admitting: Urology

## 2014-02-14 NOTE — Discharge Summary (Signed)
Patient ID: David Hartman MRN: [id number] DOB/AGE: 05/19/1960 54 y.o.  Admit date: 01/28/2014 Discharge date: 01/29/14    Discharge Diagnoses:   Present on Admission:  . HTN (hypertension) . High cholesterol . Depression . Acute left flank pain . Pain . UTI (urinary tract infection)  Consults:urology   Discharge Medications:   Medication List    STOP taking these medications        tamsulosin 0.4 MG Caps capsule  Commonly known as:  FLOMAX      TAKE these medications        allopurinol 300 MG tablet  Commonly known as:  ZYLOPRIM  Take 300 mg by mouth daily.     ALPRAZolam 1 MG tablet  Commonly known as:  XANAX  Take 1 mg by mouth daily as needed for anxiety.     atenolol 25 MG tablet  Commonly known as:  TENORMIN  Take 25 mg by mouth daily.     lisinopril 10 MG tablet  Commonly known as:  PRINIVIL,ZESTRIL  Take 10 mg by mouth daily.     omeprazole 20 MG tablet  Commonly known as:  PRILOSEC OTC  Take 20 mg by mouth daily.     ondansetron 4 MG disintegrating tablet  Commonly known as:  ZOFRAN ODT  Take 1 tablet (4 mg total) by mouth every 8 (eight) hours as needed.     oxyCODONE-acetaminophen 5-325 MG per tablet  Commonly known as:  PERCOCET/ROXICET  Take 1-2 tablets by mouth every 6 (six) hours as needed for moderate pain or severe pain.     simvastatin 40 MG tablet  Commonly known as:  ZOCOR  Take 40 mg by mouth every evening.     URIBEL 118 MG Caps  Take 1 capsule (118 mg total) by mouth 3 (three) times daily as needed.         Significant Diagnostic Studies:  Ct Renal Stone Study  01/29/2014   CLINICAL DATA:  LEFT flank pain, persistent symptoms from 2 days ago.  EXAM: CT ABDOMEN AND PELVIS WITHOUT CONTRAST  TECHNIQUE: Multidetector CT imaging of the abdomen and pelvis was performed following the standard protocol without IV contrast.  COMPARISON:  CT of the abdomen and pelvis January 26, 2014  FINDINGS: LUNG BASES: Included view of the lung  bases are clear. The visualized heart and pericardium are unremarkable.  KIDNEYS/BLADDER: Kidneys are orthotopic, demonstrating normal size and morphology. Moderate LEFT hydroureteronephrosis to the level of the distal ureter where a 6 mm calculus is again seen at the ureterovesicular junction, unchanged. Punctate LEFT LEFT lower pole nephrolithiasis. Limited assessment for renal masses on this nonenhanced examination. Increasing LEFT periureteral inflammation. Urinary bladder is partially distended and unremarkable.  SOLID ORGANS: The liver, spleen, gallbladder, pancreas and adrenal glands are unremarkable for this non-contrast examination.  GASTROINTESTINAL TRACT: The stomach, small and large bowel are normal in course and caliber without inflammatory changes, the sensitivity may be decreased by lack of enteric contrast. Subcentimeter appendicolith without CT findings of acute appendicitis. Mild rectal wall thickening may reflect internal hemorrhage or sequelae of prostate treatment.  PERITONEUM/RETROPERITONEUM: No intraperitoneal free fluid nor free air. Aortoiliac vessels are normal in course and caliber. No lymphadenopathy by CT size criteria. Prostate appears surgically absent. 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  SOFT TISSUES/ OSSEOUS STRUCTURES: Nonsuspicious. Small fat containing inguinal hernias. Bridging sacroiliac osteophytes. Moderate degenerative change of the lumbar spine.  IMPRESSION: Similar moderate LEFT  hydroureteronephrosis with 6 mm obstructing calculus at ureterovesicular junction. Increasing LEFT periureteral inflammation may reflect urinary tract infection. Residual punctate LEFT lower pole nephrolithiasis.   Electronically Signed   By: Elon Alas   On: 01/29/2014 02:37      Hospital Course:  Principal Problem:   Acute left flank pain Active Problems:   Depression    Nephrolithiasis   High cholesterol   HTN (hypertension)   Pain   UTI (urinary tract infection)   Ureteral calculus  Patient was admitted to [facility] on 01/28/14 with left flank pain.  He was noted to have a 6 mm distal ureteral stone on the left.  That hospital did not have acute urology services available.  They called me and requested transfer to my service.  We accepted the patient and performed a rigid ureteroscopy and holmium laser lithotripsy the following morning.  Surgery itself was uneventful and the patient was discharged after the procedure. Day of Discharge BP 133/67 mmHg  Pulse 81  Temp(Src) 98.4 F (36.9 C) (Oral)  Resp 16  Ht 5\' 10"  (1.778 m)  Wt 111.131 kg (245 lb)  BMI 35.15 kg/m2  SpO2 97% Well-developed well-nourished male in no acute distress Respiratory effort normal Abdomen soft and nontender no CVA tenderness Extremities no edema or tenderness  No results found for this or any previous visit (from the past 24 hour(s)).

## 2014-12-20 ENCOUNTER — Emergency Department (HOSPITAL_COMMUNITY)
Admission: EM | Admit: 2014-12-20 | Discharge: 2014-12-20 | Disposition: A | Discharge status: Home / Self Care | Payer: 59 | Attending: Emergency Medicine | Admitting: Emergency Medicine

## 2014-12-20 ENCOUNTER — Encounter (HOSPITAL_COMMUNITY): Payer: Self-pay | Admitting: *Deleted

## 2014-12-20 ENCOUNTER — Emergency Department (HOSPITAL_COMMUNITY): Payer: 59

## 2014-12-20 DIAGNOSIS — Z87438 Personal history of other diseases of male genital organs: Secondary | ICD-10-CM | POA: Diagnosis not present

## 2014-12-20 DIAGNOSIS — F329 Major depressive disorder, single episode, unspecified: Secondary | ICD-10-CM | POA: Insufficient documentation

## 2014-12-20 DIAGNOSIS — Z79899 Other long term (current) drug therapy: Secondary | ICD-10-CM | POA: Diagnosis not present

## 2014-12-20 DIAGNOSIS — I10 Essential (primary) hypertension: Secondary | ICD-10-CM | POA: Diagnosis not present

## 2014-12-20 DIAGNOSIS — E785 Hyperlipidemia, unspecified: Secondary | ICD-10-CM | POA: Diagnosis not present

## 2014-12-20 DIAGNOSIS — R42 Dizziness and giddiness: Secondary | ICD-10-CM

## 2014-12-20 DIAGNOSIS — Z8619 Personal history of other infectious and parasitic diseases: Secondary | ICD-10-CM | POA: Diagnosis not present

## 2014-12-20 DIAGNOSIS — Z8546 Personal history of malignant neoplasm of prostate: Secondary | ICD-10-CM | POA: Diagnosis not present

## 2014-12-20 DIAGNOSIS — R55 Syncope and collapse: Secondary | ICD-10-CM | POA: Diagnosis not present

## 2014-12-20 DIAGNOSIS — K219 Gastro-esophageal reflux disease without esophagitis: Secondary | ICD-10-CM | POA: Insufficient documentation

## 2014-12-20 DIAGNOSIS — Z87442 Personal history of urinary calculi: Secondary | ICD-10-CM | POA: Diagnosis not present

## 2014-12-20 DIAGNOSIS — M109 Gout, unspecified: Secondary | ICD-10-CM | POA: Diagnosis not present

## 2014-12-20 DIAGNOSIS — G4700 Insomnia, unspecified: Secondary | ICD-10-CM | POA: Diagnosis not present

## 2014-12-20 LAB — CBC WITH DIFFERENTIAL/PLATELET
BASOS PCT: 0 %
Basophils Absolute: 0 10*3/uL (ref 0.0–0.1)
EOS ABS: 0.1 10*3/uL (ref 0.0–0.7)
EOS PCT: 2 %
HCT: 40.6 % (ref 39.0–52.0)
HEMOGLOBIN: 13.9 g/dL (ref 13.0–17.0)
Lymphocytes Relative: 18 %
Lymphs Abs: 1.5 10*3/uL (ref 0.7–4.0)
MCH: 30.8 pg (ref 26.0–34.0)
MCHC: 34.2 g/dL (ref 30.0–36.0)
MCV: 90 fL (ref 78.0–100.0)
Monocytes Absolute: 0.8 10*3/uL (ref 0.1–1.0)
Monocytes Relative: 9 %
Neutro Abs: 6 10*3/uL (ref 1.7–7.7)
Neutrophils Relative %: 71 %
PLATELETS: 244 10*3/uL (ref 150–400)
RBC: 4.51 MIL/uL (ref 4.22–5.81)
RDW: 12.7 % (ref 11.5–15.5)
WBC: 8.5 10*3/uL (ref 4.0–10.5)

## 2014-12-20 LAB — COMPREHENSIVE METABOLIC PANEL
ALBUMIN: 3.8 g/dL (ref 3.5–5.0)
ALK PHOS: 58 U/L (ref 38–126)
ALT: 21 U/L (ref 17–63)
AST: 18 U/L (ref 15–41)
Anion gap: 8 (ref 5–15)
BUN: 17 mg/dL (ref 6–20)
CALCIUM: 9 mg/dL (ref 8.9–10.3)
CHLORIDE: 107 mmol/L (ref 101–111)
CO2: 23 mmol/L (ref 22–32)
Creatinine, Ser: 1.14 mg/dL (ref 0.61–1.24)
GFR calc non Af Amer: >60 mL/min (ref >60)
GLUCOSE: 97 mg/dL (ref 65–99)
Potassium: 3.8 mmol/L (ref 3.5–5.1)
SODIUM: 138 mmol/L (ref 135–145)
Total Bilirubin: 0.6 mg/dL (ref 0.3–1.2)
Total Protein: 6.5 g/dL (ref 6.5–8.1)

## 2014-12-20 MED ORDER — MECLIZINE HCL 25 MG PO TABS: 25.0000 mg | ORAL_TABLET | Freq: Three times a day (TID) | ORAL | Status: DC | PRN

## 2014-12-20 MED ORDER — SODIUM CHLORIDE 0.9 % IV SOLN
INTRAVENOUS | Status: DC
  Administered 2014-12-20: 18:00:00 via INTRAVENOUS

## 2014-12-20 MED ORDER — SODIUM CHLORIDE 0.9 % IV BOLUS (SEPSIS)
500.0000 mL | Freq: Once | INTRAVENOUS | Status: AC
  Administered 2014-12-20: 500 mL via INTRAVENOUS

## 2014-12-20 MED ORDER — MECLIZINE HCL 12.5 MG PO TABS
25.0000 mg | ORAL_TABLET | Freq: Once | ORAL | Status: AC
  Administered 2014-12-20: 25 mg via ORAL
  Filled 2014-12-20: qty 2

## 2014-12-20 NOTE — ED Provider Notes (Signed)
CSN: [id number]     Arrival date & time 12/20/14  1632 History   First MD Initiated Contact with Patient 12/20/14 1658     Chief Complaint  Patient presents with  . Dizziness     (Consider location/radiation/quality/duration/timing/severity/associated sxs/prior Treatment) Patient is a 54 y.o. male presenting with dizziness. The history is provided by the patient.  Dizziness Associated symptoms: no chest pain, no headaches, no shortness of breath and no weakness    patient with complaint of dizziness intermittent not true vertigo no room spinning patient with admission for a syncopal episode that occurred on Saturday at [facility] and was kept overnight and monitored and discharged on Sunday had a head CT CT of neck at that time that was negative patient's cardiac monitoring showed no signs of arrhythmia. Symptoms started back again today did not have a syncopal episode. No visual changes no focal neuro deficits no true room spinning no numbness or weakness. No headaches. Patient states that the dizziness is brought on when he first stands up or when he is bent over and then he raises up.  Past Medical History  Diagnosis Date  . Chest pain   . Shoulder pain   . Depression   . Erectile dysfunction   . GERD (gastroesophageal reflux disease)   . Hyperlipidemia   . HTN (hypertension)   . Insomnia   . Dysuria   . Gastroenteritis   . Insomnia   . Onychomycosis   . High cholesterol   . Gout   . Prostate cancer ([place])   . Basal cell carcinoma   . Nephrolithiasis    Past Surgical History  Procedure Laterality Date  . Prostatectomy    . Cystoscopy w/ ureteral stent placement Left 01/30/2014    Procedure: CYSTOSCOPY WITH RETROGRADE PYELOGRAM/URETEROSCOPY/URETERAL STENT PLACEMENT;  Surgeon: Bernestine Amass, MD;  Location: [facility];  Service: Urology;  Laterality: Left;  . Holmium laser application Left [id number]    Procedure: HOLMIUM LASER APPLICATION;  Surgeon: Bernestine Amass, MD;  Location:  [facility];  Service: Urology;  Laterality: Left;   History reviewed. No pertinent family history. Social History  Substance Use Topics  . Smoking status: Never Smoker   . Smokeless tobacco: None  . Alcohol Use: No     Comment: "a beer once in a while"     Review of Systems  Constitutional: Negative for fever.  HENT: Negative for congestion, ear pain and sinus pressure.   Eyes: Negative for visual disturbance.  Respiratory: Negative for shortness of breath.   Cardiovascular: Negative for chest pain.  Gastrointestinal: Negative for abdominal pain.  Genitourinary: Negative for dysuria.  Musculoskeletal: Negative for neck pain.  Skin: Negative for rash.  Neurological: Positive for dizziness and syncope. Negative for weakness, numbness and headaches.  Psychiatric/Behavioral: Negative for confusion.      Allergies  Review of patient's allergies indicates no known allergies.  Home Medications   Prior to Admission medications   Medication Sig Start Date End Date Taking? Authorizing Provider  allopurinol (ZYLOPRIM) 300 MG tablet Take 300 mg by mouth daily.   Yes Historical Provider, MD  atenolol (TENORMIN) 25 MG tablet Take 25 mg by mouth daily.   Yes Historical Provider, MD  lisinopril (PRINIVIL,ZESTRIL) 10 MG tablet Take 10 mg by mouth daily.   Yes Historical Provider, MD  omeprazole (PRILOSEC OTC) 20 MG tablet Take 20 mg by mouth daily.   Yes Historical Provider, MD  simvastatin (ZOCOR) 40 MG tablet Take 40 mg by mouth every  evening.   Yes Historical Provider, MD  ALPRAZolam Duanne Moron) 1 MG tablet Take 1 mg by mouth daily as needed for anxiety.  12/07/13   Historical Provider, MD  meclizine (ANTIVERT) 25 MG tablet Take 1 tablet (25 mg total) by mouth 3 (three) times daily as needed for dizziness. 12/20/14   Fredia Sorrow, MD  Meth-Hyo-M Barnett Hatter Phos-Ph Sal (URIBEL) 118 MG CAPS Take 1 capsule (118 mg total) by mouth 3 (three) times daily as needed. Patient not taking: Reported on  12/20/2014 01/30/14   Rana Snare, MD  ondansetron (ZOFRAN ODT) 4 MG disintegrating tablet Take 1 tablet (4 mg total) by mouth every 8 (eight) hours as needed. Patient not taking: Reported on 12/20/2014 01/26/14   Fredia Sorrow, MD  oxyCODONE-acetaminophen (PERCOCET/ROXICET) 5-325 MG per tablet Take 1-2 tablets by mouth every 6 (six) hours as needed for moderate pain or severe pain. Patient not taking: Reported on 01/29/2014 01/26/14   Fredia Sorrow, MD   BP 131/86 mmHg  Pulse 64  Temp(Src) 98.3 F (36.8 C) (Oral)  Resp 18  SpO2 100% Physical Exam  Constitutional: He is oriented to person, place, and time. He appears well-developed and well-nourished. No distress.  HENT:  Head: Normocephalic and atraumatic.  Right Ear: External ear normal.  Left Ear: External ear normal.  Mouth/Throat: Oropharynx is clear and moist.  Eyes: Conjunctivae and EOM are normal. Pupils are equal, round, and reactive to light.  Neck: Normal range of motion. Neck supple.  Cardiovascular: Normal rate and regular rhythm.   Pulmonary/Chest: Effort normal and breath sounds normal. No respiratory distress.  Abdominal: Soft. Bowel sounds are normal. There is no tenderness.  Musculoskeletal: Normal range of motion.  Neurological: He is alert and oriented to person, place, and time. No cranial nerve deficit. He exhibits normal muscle tone. Coordination normal.  Skin: Skin is warm. No rash noted.  Nursing note and vitals reviewed.   ED Course  Procedures (including critical care time) Labs Review Labs Reviewed  CBC WITH DIFFERENTIAL/PLATELET  COMPREHENSIVE METABOLIC PANEL   Results for orders placed or performed during the hospital encounter of 12/20/14  CBC with Differential/Platelet  Result Value Ref Range   WBC 8.5 4.0 - 10.5 K/uL   RBC 4.51 4.22 - 5.81 MIL/uL   Hemoglobin 13.9 13.0 - 17.0 g/dL   HCT 40.6 39.0 - 52.0 %   MCV 90.0 78.0 - 100.0 fL   MCH 30.8 26.0 - 34.0 pg   MCHC 34.2 30.0 - 36.0 g/dL    RDW 12.7 11.5 - 15.5 %   Platelets 244 150 - 400 K/uL   Neutrophils Relative % 71 %   Neutro Abs 6.0 1.7 - 7.7 K/uL   Lymphocytes Relative 18 %   Lymphs Abs 1.5 0.7 - 4.0 K/uL   Monocytes Relative 9 %   Monocytes Absolute 0.8 0.1 - 1.0 K/uL   Eosinophils Relative 2 %   Eosinophils Absolute 0.1 0.0 - 0.7 K/uL   Basophils Relative 0 %   Basophils Absolute 0.0 0.0 - 0.1 K/uL  Comprehensive metabolic panel  Result Value Ref Range   Sodium 138 135 - 145 mmol/L   Potassium 3.8 3.5 - 5.1 mmol/L   Chloride 107 101 - 111 mmol/L   CO2 23 22 - 32 mmol/L   Glucose, Bld 97 65 - 99 mg/dL   BUN 17 6 - 20 mg/dL   Creatinine, Ser 1.14 0.61 - 1.24 mg/dL   Calcium 9.0 8.9 - 10.3 mg/dL   Total Protein 6.5  6.5 - 8.1 g/dL   Albumin 3.8 3.5 - 5.0 g/dL   AST 18 15 - 41 U/L   ALT 21 17 - 63 U/L   Alkaline Phosphatase 58 38 - 126 U/L   Total Bilirubin 0.6 0.3 - 1.2 mg/dL   GFR calc non Af Amer >60 >60 mL/min   GFR calc Af Amer >60 >60 mL/min   Anion gap 8 5 - 15     Imaging Review Mr Brain Wo Contrast  12/20/2014  CLINICAL DATA:  Dizziness for 4 days. Recent fall with broken nose. Hit head against table. Syncopal episode. EXAM: MRI HEAD WITHOUT CONTRAST TECHNIQUE: Multiplanar, multiecho pulse sequences of the brain and surrounding structures were obtained without intravenous contrast. COMPARISON:  None available. FINDINGS: The diffusion-weighted images demonstrate no evidence for acute or subacute infarction. No acute hemorrhage or mass lesion is present. No significant white matter disease is present. The ventricles are of normal size. The internal auditory canals are within normal limits bilaterally. Flow is present in the major intracranial arteries. The globes and orbits are intact. The paranasal sinuses and mastoid air cells are clear. Skullbase is within normal limits. Midline structures are unremarkable. IMPRESSION: 1. Negative MRI of the brain. Electronically Signed   By: San Morelle M.D.    On: 12/20/2014 18:46   I have personally reviewed and evaluated these images and lab results as part of my medical decision-making.   EKG Interpretation   Date/Time:  Wednesday December 20 2014 16:43:38 EST Ventricular Rate:  69 PR Interval:  196 QRS Duration: 86 QT Interval:  391 QTC Calculation: 419 R Axis:   62 Text Interpretation:  Sinus rhythm Confirmed by Chirstine Defrain  MD, Reeanna Acri  ([id number]) on 12/20/2014 5:21:32 PM      MDM   Final diagnoses:  Dizziness   The patient without orthostatic hypotensive problems here patient with dizziness no room spinning. MRI brain negative for any acute findings no evidence of stroke no evidence of any brain tumor. Patient's labs without significant abnormalities. Patient stable for discharge home I will also start anti-bur just is a trial patient will follow-up with primary care doctor. Patient may require referral to ear nose and throat and/or neurology. Work note provided.     Fredia Sorrow, MD 12/20/14 2018

## 2014-12-20 NOTE — ED Notes (Signed)
Pt. C/o dizziness that started Saturday. States he had a syncopal episode and was admitted to [facility] and was discharged with dx of dehydration. Pt. Reports dizziness has continued. Also reports nausea. Denies CP, blurred vision, numbness or weakness.

## 2014-12-20 NOTE — Discharge Instructions (Signed)
Dizziness Dizziness is a common problem. It makes you feel unsteady or lightheaded. You may feel like you are about to pass out (faint). Dizziness can lead to injury if you stumble or fall. Anyone can get dizzy, but dizziness is more common in older adults. This condition can be caused by a number of things, including:  Medicines.  Dehydration.  Illness. HOME CARE Following these instructions may help with your condition: Eating and Drinking  Drink enough fluid to keep your pee (urine) clear or pale yellow. This helps to keep you from getting dehydrated. Try to drink more clear fluids, such as water.  Do not drink alcohol.  Limit how much caffeine you drink or eat if told by your doctor.  Limit how much salt you drink or eat if told by your doctor. Activity  Avoid making quick movements.  When you stand up from sitting in a chair, steady yourself until you feel okay.  In the morning, first sit up on the side of the bed. When you feel okay, stand slowly while you hold onto something. Do this until you know that your balance is fine.  Move your legs often if you need to stand in one place for a long time. Tighten and relax your muscles in your legs while you are standing.  Do not drive or use heavy machinery if you feel dizzy.  Avoid bending down if you feel dizzy. Place items in your home so that they are easy for you to reach without leaning over. Lifestyle  Do not use any tobacco products, including cigarettes, chewing tobacco, or electronic cigarettes. If you need help quitting, ask your doctor.  Try to lower your stress level, such as with yoga or meditation. Talk with your doctor if you need help. General Instructions  Watch your dizziness for any changes.  Take medicines only as told by your doctor. Talk with your doctor if you think that your dizziness is caused by a medicine that you are taking.  Tell a friend or a family member that you are feeling dizzy. If he or  she notices any changes in your behavior, have this person call your doctor.  Keep all follow-up visits as told by your doctor. This is important. GET HELP IF:  Your dizziness does not go away.  Your dizziness or light-headedness gets worse.  You feel sick to your stomach (nauseous).  You have trouble hearing.  You have new symptoms.  You are unsteady on your feet or you feel like the room is spinning. GET HELP RIGHT AWAY IF:  You throw up (vomit) or have diarrhea and are unable to eat or drink anything.  You have trouble:  Talking.  Walking.  Swallowing.  Using your arms, hands, or legs.  You feel generally weak.  You are not thinking clearly or you have trouble forming sentences. It may take a friend or family member to notice this.  You have:  Chest pain.  Pain in your belly (abdomen).  Shortness of breath.  Sweating.  Your vision changes.  You are bleeding.  You have a headache.  You have neck pain or a stiff neck.  You have a fever.   This information is not intended to replace advice given to you by your health care provider. Make sure you discuss any questions you have with your health care provider.   Workup here without any significant findings to explain the dizziness. Take the Antivert medicine as directed. Make appoint with follow-up with  your regular doctor. Work note provided. Return for any new or worse symptoms.   Document Released: 12/26/2010 Document Revised: 05/23/2014 Document Reviewed: 01/02/2014 Elsevier Interactive Patient Education [employer].

## 2015-01-21 HISTORY — PX: COLONOSCOPY W/ POLYPECTOMY: SHX1380

## 2015-04-06 ENCOUNTER — Encounter (HOSPITAL_COMMUNITY): Payer: Self-pay

## 2015-04-06 ENCOUNTER — Emergency Department (HOSPITAL_COMMUNITY): Payer: 59

## 2015-04-06 ENCOUNTER — Emergency Department (HOSPITAL_COMMUNITY)
Admission: EM | Admit: 2015-04-06 | Discharge: 2015-04-06 | Disposition: A | Discharge status: Home / Self Care | Payer: 59 | Attending: Emergency Medicine | Admitting: Emergency Medicine

## 2015-04-06 DIAGNOSIS — R51 Headache: Secondary | ICD-10-CM | POA: Diagnosis present

## 2015-04-06 DIAGNOSIS — I10 Essential (primary) hypertension: Secondary | ICD-10-CM | POA: Diagnosis not present

## 2015-04-06 DIAGNOSIS — Z79899 Other long term (current) drug therapy: Secondary | ICD-10-CM | POA: Insufficient documentation

## 2015-04-06 DIAGNOSIS — R519 Headache, unspecified: Secondary | ICD-10-CM

## 2015-04-06 DIAGNOSIS — E785 Hyperlipidemia, unspecified: Secondary | ICD-10-CM | POA: Insufficient documentation

## 2015-04-06 DIAGNOSIS — F329 Major depressive disorder, single episode, unspecified: Secondary | ICD-10-CM | POA: Insufficient documentation

## 2015-04-06 LAB — CBC WITH DIFFERENTIAL/PLATELET
Basophils Absolute: 0 10*3/uL (ref 0.0–0.1)
Basophils Relative: 0 %
EOS ABS: 0.2 10*3/uL (ref 0.0–0.7)
EOS PCT: 3 %
HCT: 42 % (ref 39.0–52.0)
Hemoglobin: 14.1 g/dL (ref 13.0–17.0)
Lymphocytes Relative: 25 %
Lymphs Abs: 1.8 10*3/uL (ref 0.7–4.0)
MCH: 29.6 pg (ref 26.0–34.0)
MCHC: 33.6 g/dL (ref 30.0–36.0)
MCV: 88.2 fL (ref 78.0–100.0)
Monocytes Absolute: 0.7 10*3/uL (ref 0.1–1.0)
Monocytes Relative: 10 %
NEUTROS PCT: 62 %
Neutro Abs: 4.7 10*3/uL (ref 1.7–7.7)
PLATELETS: 215 10*3/uL (ref 150–400)
RBC: 4.76 MIL/uL (ref 4.22–5.81)
RDW: 12.7 % (ref 11.5–15.5)
WBC: 7.4 10*3/uL (ref 4.0–10.5)

## 2015-04-06 LAB — URINE MICROSCOPIC-ADD ON
Squamous Epithelial / LPF: NONE SEEN
WBC, UA: NONE SEEN WBC/hpf (ref 0–5)

## 2015-04-06 LAB — BASIC METABOLIC PANEL
Anion gap: 7 (ref 5–15)
BUN: 14 mg/dL (ref 6–20)
CALCIUM: 8.7 mg/dL — AB (ref 8.9–10.3)
CO2: 25 mmol/L (ref 22–32)
CREATININE: 1.06 mg/dL (ref 0.61–1.24)
Chloride: 105 mmol/L (ref 101–111)
GFR calc Af Amer: >60 mL/min (ref >60)
GFR calc non Af Amer: >60 mL/min (ref >60)
GLUCOSE: 90 mg/dL (ref 65–99)
Potassium: 3.7 mmol/L (ref 3.5–5.1)
SODIUM: 137 mmol/L (ref 135–145)

## 2015-04-06 LAB — URINALYSIS, ROUTINE W REFLEX MICROSCOPIC
Bilirubin Urine: NEGATIVE
GLUCOSE, UA: NEGATIVE mg/dL
Ketones, ur: NEGATIVE mg/dL
LEUKOCYTES UA: NEGATIVE
Nitrite: NEGATIVE
PH: 5.5 (ref 5.0–8.0)
Protein, ur: NEGATIVE mg/dL
Specific Gravity, Urine: <1.005 — ABNORMAL LOW (ref 1.005–1.030)

## 2015-04-06 LAB — TROPONIN I

## 2015-04-06 MED ORDER — TRAMADOL HCL 50 MG PO TABS: 50.0000 mg | ORAL_TABLET | Freq: Four times a day (QID) | ORAL | Status: DC | PRN

## 2015-04-06 MED ORDER — KETOROLAC TROMETHAMINE 30 MG/ML IJ SOLN
30.0000 mg | Freq: Once | INTRAMUSCULAR | Status: AC
  Administered 2015-04-06: 30 mg via INTRAVENOUS
  Filled 2015-04-06: qty 1

## 2015-04-06 NOTE — ED Notes (Signed)
Patient on cardiac monitoring and EKG done and given to Dr Betsey Holiday.

## 2015-04-06 NOTE — Discharge Instructions (Signed)
General Headache Without Cause °A headache is pain or discomfort felt around the head or neck area. The specific cause of a headache may not be found. There are many causes and types of headaches. A few common ones are: °· Tension headaches. °· Migraine headaches. °· Cluster headaches. °· Chronic daily headaches. °HOME CARE INSTRUCTIONS  °Watch your condition for any changes. Take these steps to help with your condition: °Managing Pain °· Take over-the-counter and prescription medicines only as told by your health care provider. °· Lie down in a dark, quiet room when you have a headache. °· If directed, apply ice to the head and neck area: °· Put ice in a plastic bag. °· Place a towel between your skin and the bag. °· Leave the ice on for 20 minutes, 2-3 times per day. °· Use a heating pad or hot shower to apply heat to the head and neck area as told by your health care provider. °· Keep lights dim if bright lights bother you or make your headaches worse. °Eating and Drinking °· Eat meals on a regular schedule. °· Limit alcohol use. °· Decrease the amount of caffeine you drink, or stop drinking caffeine. °General Instructions °· Keep all follow-up visits as told by your health care provider. This is important. °· Keep a headache journal to help find out what may trigger your headaches. For example, write down: °· What you eat and drink. °· How much sleep you get. °· Any change to your diet or medicines. °· Try massage or other relaxation techniques. °· Limit stress. °· Sit up straight, and do not tense your muscles. °· Do not use tobacco products, including cigarettes, chewing tobacco, or e-cigarettes. If you need help quitting, ask your health care provider. °· Exercise regularly as told by your health care provider. °· Sleep on a regular schedule. Get 7-9 hours of sleep, or the amount recommended by your health care provider. °SEEK MEDICAL CARE IF:  °· Your symptoms are not helped by medicine. °· You have a  headache that is different from the usual headache. °· You have nausea or you vomit. °· You have a fever. °SEEK IMMEDIATE MEDICAL CARE IF:  °· Your headache becomes severe. °· You have repeated vomiting. °· You have a stiff neck. °· You have a loss of vision. °· You have problems with speech. °· You have pain in the eye or ear. °· You have muscular weakness or loss of muscle control. °· You lose your balance or have trouble walking. °· You feel faint or pass out. °· You have confusion. °  °This information is not intended to replace advice given to you by your health care provider. Make sure you discuss any questions you have with your health care provider. °  °Document Released: 01/06/2005 Document Revised: 09/27/2014 Document Reviewed: 05/01/2014 °Elsevier Interactive Patient Education ©2016 Elsevier Inc[employer] ° °Hypertension °Hypertension, commonly called high blood pressure, is when the force of blood pumping through your arteries is too strong. Your arteries are the blood vessels that carry blood from your heart throughout your body. A blood pressure reading consists of a higher number over a lower number, such as 110/72. The higher number (systolic) is the pressure inside your arteries when your heart pumps. The lower number (diastolic) is the pressure inside your arteries when your heart relaxes. Ideally you want your blood pressure below 120/80. °Hypertension forces your heart to work harder to pump blood. Your arteries may become narrow or stiff. Having untreated or   uncontrolled hypertension can cause heart attack, stroke, kidney disease, and other problems. °RISK FACTORS °Some risk factors for high blood pressure are controllable. Others are not.  °Risk factors you cannot control include:  °· Race. You may be at higher risk if you are African American. °· Age. Risk increases with age. °· Gender. Men are at higher risk than women before age 45 years. After age 65, women are at higher risk than men. °Risk factors  you can control include: °· Not getting enough exercise or physical activity. °· Being overweight. °· Getting too much fat, sugar, calories, or salt in your diet. °· Drinking too much alcohol. °SIGNS AND SYMPTOMS °Hypertension does not usually cause signs or symptoms. Extremely high blood pressure (hypertensive crisis) may cause headache, anxiety, shortness of breath, and nosebleed. °DIAGNOSIS °To check if you have hypertension, your health care provider will measure your blood pressure while you are seated, with your arm held at the level of your heart. It should be measured at least twice using the same arm. Certain conditions can cause a difference in blood pressure between your right and left arms. A blood pressure reading that is higher than normal on one occasion does not mean that you need treatment. If it is not clear whether you have high blood pressure, you may be asked to return on a different day to have your blood pressure checked again. Or, you may be asked to monitor your blood pressure at home for 1 or more weeks. °TREATMENT °Treating high blood pressure includes making lifestyle changes and possibly taking medicine. Living a healthy lifestyle can help lower high blood pressure. You may need to change some of your habits. °Lifestyle changes may include: °· Following the DASH diet. This diet is high in fruits, vegetables, and whole grains. It is low in salt, red meat, and added sugars. °· Keep your sodium intake below 2,300 mg per day. °· Getting at least 30-45 minutes of aerobic exercise at least 4 times per week. °· Losing weight if necessary. °· Not smoking. °· Limiting alcoholic beverages. °· Learning ways to reduce stress. °Your health care provider may prescribe medicine if lifestyle changes are not enough to get your blood pressure under control, and if one of the following is true: °· You are 18-59 years of age and your systolic blood pressure is above 140. °· You are 60 years of age or older,  and your systolic blood pressure is above 150. °· Your diastolic blood pressure is above 90. °· You have diabetes, and your systolic blood pressure is over 140 or your diastolic blood pressure is over 90. °· You have kidney disease and your blood pressure is above 140/90. °· You have heart disease and your blood pressure is above 140/90. °Your personal target blood pressure may vary depending on your medical conditions, your age, and other factors. °HOME CARE INSTRUCTIONS °· Have your blood pressure rechecked as directed by your health care provider.   °· Take medicines only as directed by your health care provider. Follow the directions carefully. Blood pressure medicines must be taken as prescribed. The medicine does not work as well when you skip doses. Skipping doses also puts you at risk for problems. °· Do not smoke.   °· Monitor your blood pressure at home as directed by your health care provider.  °SEEK MEDICAL CARE IF:  °· You think you are having a reaction to medicines taken. °· You have recurrent headaches or feel dizzy. °· You have swelling in your   ankles. °· You have trouble with your vision. °SEEK IMMEDIATE MEDICAL CARE IF: °· You develop a severe headache or confusion. °· You have unusual weakness, numbness, or feel faint. °· You have severe chest or abdominal pain. °· You vomit repeatedly. °· You have trouble breathing. °MAKE SURE YOU:  °· Understand these instructions. °· Will watch your condition. °· Will get help right away if you are not doing well or get worse. °  °This information is not intended to replace advice given to you by your health care provider. Make sure you discuss any questions you have with your health care provider. °  °Document Released: 01/06/2005 Document Revised: 05/23/2014 Document Reviewed: 10/29/2012 °Elsevier Interactive Patient Education ©2016 Elsevier Inc. ° °

## 2015-04-06 NOTE — ED Notes (Signed)
Pt chief complaint of headache since Tuesday, reports some dizziness as well.  Pt feels he has been dehydrated since being treated for the flu 2 weeks ago.

## 2015-04-06 NOTE — ED Provider Notes (Signed)
CSN: [id number]     Arrival date & time 04/06/15  0010 History  By signing my name below, I, David Hartman, attest that this documentation has been prepared under the direction and in the presence of Orpah Greek, MD. Electronically Signed: Judithann Sauger, ED Scribe. 04/06/2015. 12:28 AM.    Chief Complaint  Patient presents with  . Headache  . Dizziness   The history is provided by the patient. No language interpreter was used.   HPI Comments: David Hartman is a 55 y.o. male with a hx of HTN who presents to the Emergency Department complaining of gradually worsening constant HA that started on his left posterior neck that radiates to the top of head onset 2 days ago. He reports associated dizziness and blurred vision. He explains that he was treated for the flu and pneumonia 3 weeks ago. He adds that he is here because he believes that he is dehydrated. He states that he was taken off one of his HTN medications in December 2016 due to his weight loss. No alleviating factors noted. Pt has not tried any medications for his symptoms but has increased his fluid intake. He denies any SOB or n/v.   Past Medical History  Diagnosis Date  . Chest pain   . Shoulder pain   . Depression   . Erectile dysfunction   . GERD (gastroesophageal reflux disease)   . Hyperlipidemia   . HTN (hypertension)   . Insomnia   . Dysuria   . Gastroenteritis   . Insomnia   . Onychomycosis   . High cholesterol   . Gout   . Prostate cancer ([place])   . Basal cell carcinoma   . Nephrolithiasis    Past Surgical History  Procedure Laterality Date  . Prostatectomy    . Cystoscopy w/ ureteral stent placement Left 01/30/2014    Procedure: CYSTOSCOPY WITH RETROGRADE PYELOGRAM/URETEROSCOPY/URETERAL STENT PLACEMENT;  Surgeon: Bernestine Amass, MD;  Location: [facility];  Service: Urology;  Laterality: Left;  . Holmium laser application Left [id number]    Procedure: HOLMIUM LASER APPLICATION;  Surgeon: Bernestine Amass, MD;  Location: [facility];  Service: Urology;  Laterality: Left;   No family history on file. Social History  Substance Use Topics  . Smoking status: Never Smoker   . Smokeless tobacco: None  . Alcohol Use: No     Comment: "a beer once in a while"     Review of Systems    Allergies  Review of patient's allergies indicates no known allergies.  Home Medications   Prior to Admission medications   Medication Sig Start Date End Date Taking? Authorizing Provider  allopurinol (ZYLOPRIM) 300 MG tablet Take 300 mg by mouth daily.   Yes Historical Provider, MD  ALPRAZolam Duanne Moron) 1 MG tablet Take 1 mg by mouth daily as needed for anxiety.  12/07/13  Yes Historical Provider, MD  lisinopril (PRINIVIL,ZESTRIL) 10 MG tablet Take 10 mg by mouth daily.   Yes Historical Provider, MD  omeprazole (PRILOSEC OTC) 20 MG tablet Take 20 mg by mouth daily.   Yes Historical Provider, MD  simvastatin (ZOCOR) 40 MG tablet Take 40 mg by mouth every evening.   Yes Historical Provider, MD  atenolol (TENORMIN) 25 MG tablet Take 25 mg by mouth daily.    Historical Provider, MD  meclizine (ANTIVERT) 25 MG tablet Take 1 tablet (25 mg total) by mouth 3 (three) times daily as needed for dizziness. 12/20/14   Fredia Sorrow, MD  Meth-Hyo-M Barnett Hatter  Phos-Ph Sal (URIBEL) 118 MG CAPS Take 1 capsule (118 mg total) by mouth 3 (three) times daily as needed. Patient not taking: Reported on 12/20/2014 01/30/14   Rana Snare, MD  ondansetron (ZOFRAN ODT) 4 MG disintegrating tablet Take 1 tablet (4 mg total) by mouth every 8 (eight) hours as needed. Patient not taking: Reported on 12/20/2014 01/26/14   Fredia Sorrow, MD  oxyCODONE-acetaminophen (PERCOCET/ROXICET) 5-325 MG per tablet Take 1-2 tablets by mouth every 6 (six) hours as needed for moderate pain or severe pain. Patient not taking: Reported on 01/29/2014 01/26/14   Fredia Sorrow, MD   BP 167/95 mmHg  Pulse 72  Temp(Src) 97.9 F (36.6 C) (Oral)  Resp 20  Ht 5\' 10"   (1.778 m)  Wt 236 lb (107.049 kg)  BMI 33.86 kg/m2  SpO2 100% Physical Exam  Constitutional: He is oriented to person, place, and time. He appears well-developed and well-nourished. No distress.  HENT:  Head: Normocephalic and atraumatic.  Right Ear: Hearing normal.  Left Ear: Hearing normal.  Nose: Nose normal.  Mouth/Throat: Oropharynx is clear and moist and mucous membranes are normal.  Eyes: Conjunctivae and EOM are normal. Pupils are equal, round, and reactive to light.  Neck: Normal range of motion. Neck supple.  Cardiovascular: Regular rhythm, S1 normal and S2 normal.  Exam reveals no gallop and no friction rub.   No murmur heard. Pulmonary/Chest: Effort normal and breath sounds normal. No respiratory distress. He exhibits no tenderness.  Abdominal: Soft. Normal appearance and bowel sounds are normal. There is no hepatosplenomegaly. There is no tenderness. There is no rebound, no guarding, no tenderness at McBurney's point and negative Murphy's sign. No hernia.  Musculoskeletal: Normal range of motion.  Neurological: He is alert and oriented to person, place, and time. He has normal strength. No cranial nerve deficit or sensory deficit. Coordination normal. GCS eye subscore is 4. GCS verbal subscore is 5. GCS motor subscore is 6.  Skin: Skin is warm, dry and intact. No rash noted. No cyanosis.  Psychiatric: He has a normal mood and affect. His speech is normal and behavior is normal. Thought content normal.  Nursing note and vitals reviewed.   ED Course  Procedures (including critical care time) DIAGNOSTIC STUDIES: Oxygen Saturation is 100% on RA, normal by my interpretation.    COORDINATION OF CARE: 12:27 AM- Pt advised of plan for treatment and pt agrees. Pt will receive CT scan and lab work for further evaluation.    Labs Review Labs Reviewed  BASIC METABOLIC PANEL - Abnormal; Notable for the following:    Calcium 8.7 (*)    All other components within normal limits   URINALYSIS, ROUTINE W REFLEX MICROSCOPIC (NOT AT [facility]) - Abnormal; Notable for the following:    Color, Urine STRAW (*)    Specific Gravity, Urine <1.005 (*)    Hgb urine dipstick TRACE (*)    All other components within normal limits  URINE MICROSCOPIC-ADD ON - Abnormal; Notable for the following:    Bacteria, UA RARE (*)    All other components within normal limits  CBC WITH DIFFERENTIAL/PLATELET  TROPONIN I    Imaging Review Ct Head Wo Contrast  04/06/2015  CLINICAL DATA:  Acute onset of worsening headache and left posterior neck pain. Dizziness and blurred vision. Initial encounter. EXAM: CT HEAD WITHOUT CONTRAST TECHNIQUE: Contiguous axial images were obtained from the base of the skull through the vertex without intravenous contrast. COMPARISON:  MRI of the brain performed 12/20/2014 FINDINGS: There is  no evidence of acute infarction, mass lesion, or intra- or extra-axial hemorrhage on CT. The posterior fossa, including the cerebellum, brainstem and fourth ventricle, is within normal limits. The third and lateral ventricles, and basal ganglia are unremarkable in appearance. The cerebral hemispheres are symmetric in appearance, with normal gray-white differentiation. No mass effect or midline shift is seen. There is no evidence of fracture; visualized osseous structures are unremarkable in appearance. The orbits are within normal limits. The paranasal sinuses and mastoid air cells are well-aerated. No significant soft tissue abnormalities are seen. IMPRESSION: Unremarkable noncontrast CT of the head. Electronically Signed   By: Garald Balding M.D.   On: 04/06/2015 01:23     Orpah Greek, MD has personally reviewed and evaluated these images and lab results as part of his medical decision-making.   EKG Interpretation   Date/Time:  Friday April 06 2015 00:36:14 EDT Ventricular Rate:  61 PR Interval:  207 QRS Duration: 89 QT Interval:  402 QTC Calculation: 405 R Axis:    88 Text Interpretation:  Sinus rhythm Borderline prolonged PR interval  Otherwise within normal limits Confirmed by Kaoir Loree  MD, Ashyla Luth  [phone]) on 04/06/2015 12:38:53 AM      MDM   Final diagnoses:  Headache, unspecified headache type  Essential hypertension    Patient presents to the emergency department for evaluation of elevated blood pressure and headache. He reports that he has been experiencing headache for 2 or 3 days. He has noticed that his blood pressure has been elevated over this period of time. He does have a history of hypertension, however recently had his atenolol discontinued because of syncope and low blood pressures. Tonight he started to feel dizziness and his blood pressure became very elevated, presented to the ER. He reports intermittent headache on the left side of his head that starts in the occipital area and progresses forward. He has a normal neurologic evaluation. He says that he thinks he was dehydrated earlier in the week, but has been drinking large volumes of water today. Lab work is normal. No renal failure. No signs of any significant dehydration. No electrolyte abnormality. EKG unremarkable, troponin unremarkable. CT head performed because of unusual headache. No abnormality noted. Patient reassured, will need to continue checking his blood pressures at home and follow-up with his primary doctor for a recheck next week and possible alteration of his medications. His blood pressure has come down into the normotensive region here in the ER without intervention.  I personally performed the services described in this documentation, which was scribed in my presence. The recorded information has been reviewed and is accurate.    Orpah Greek, MD 04/06/15 [phone]

## 2015-04-06 NOTE — ED Notes (Signed)
Pt alert & oriented x4, stable gait. Patient given discharge instructions, paperwork & prescription(s). Patient  instructed to stop at the registration desk to finish any additional paperwork. Patient verbalized understanding. Pt left department w/ no further questions. 

## 2015-04-15 ENCOUNTER — Emergency Department (HOSPITAL_COMMUNITY)
Admission: EM | Admit: 2015-04-15 | Discharge: 2015-04-15 | Disposition: A | Discharge status: Home / Self Care | Payer: 59 | Attending: Emergency Medicine | Admitting: Emergency Medicine

## 2015-04-15 ENCOUNTER — Emergency Department (HOSPITAL_COMMUNITY): Payer: 59

## 2015-04-15 ENCOUNTER — Encounter (HOSPITAL_COMMUNITY): Payer: Self-pay | Admitting: Emergency Medicine

## 2015-04-15 DIAGNOSIS — F329 Major depressive disorder, single episode, unspecified: Secondary | ICD-10-CM | POA: Diagnosis not present

## 2015-04-15 DIAGNOSIS — Z791 Long term (current) use of non-steroidal anti-inflammatories (NSAID): Secondary | ICD-10-CM | POA: Diagnosis not present

## 2015-04-15 DIAGNOSIS — I10 Essential (primary) hypertension: Secondary | ICD-10-CM | POA: Insufficient documentation

## 2015-04-15 DIAGNOSIS — R51 Headache: Secondary | ICD-10-CM | POA: Insufficient documentation

## 2015-04-15 DIAGNOSIS — Z8546 Personal history of malignant neoplasm of prostate: Secondary | ICD-10-CM | POA: Insufficient documentation

## 2015-04-15 DIAGNOSIS — E785 Hyperlipidemia, unspecified: Secondary | ICD-10-CM | POA: Insufficient documentation

## 2015-04-15 DIAGNOSIS — R079 Chest pain, unspecified: Secondary | ICD-10-CM

## 2015-04-15 DIAGNOSIS — R519 Headache, unspecified: Secondary | ICD-10-CM

## 2015-04-15 DIAGNOSIS — Z79899 Other long term (current) drug therapy: Secondary | ICD-10-CM | POA: Insufficient documentation

## 2015-04-15 LAB — I-STAT TROPONIN, ED: Troponin i, poc: 0 ng/mL (ref 0.00–0.08)

## 2015-04-15 NOTE — ED Notes (Signed)
Pt states he has been having trouble with his blood pressure for the past week or so.  Has been having headaches.  Has had recent blood pressure medication changes.

## 2015-04-15 NOTE — Discharge Instructions (Signed)
General Headache Without Cause °A headache is pain or discomfort felt around the head or neck area. The specific cause of a headache may not be found. There are many causes and types of headaches. A few common ones are: °· Tension headaches. °· Migraine headaches. °· Cluster headaches. °· Chronic daily headaches. °HOME CARE INSTRUCTIONS  °Watch your condition for any changes. Take these steps to help with your condition: °Managing Pain °· Take over-the-counter and prescription medicines only as told by your health care provider. °· Lie down in a dark, quiet room when you have a headache. °· If directed, apply ice to the head and neck area: °· Put ice in a plastic bag. °· Place a towel between your skin and the bag. °· Leave the ice on for 20 minutes, 2-3 times per day. °· Use a heating pad or hot shower to apply heat to the head and neck area as told by your health care provider. °· Keep lights dim if bright lights bother you or make your headaches worse. °Eating and Drinking °· Eat meals on a regular schedule. °· Limit alcohol use. °· Decrease the amount of caffeine you drink, or stop drinking caffeine. °General Instructions °· Keep all follow-up visits as told by your health care provider. This is important. °· Keep a headache journal to help find out what may trigger your headaches. For example, write down: °· What you eat and drink. °· How much sleep you get. °· Any change to your diet or medicines. °· Try massage or other relaxation techniques. °· Limit stress. °· Sit up straight, and do not tense your muscles. °· Do not use tobacco products, including cigarettes, chewing tobacco, or e-cigarettes. If you need help quitting, ask your health care provider. °· Exercise regularly as told by your health care provider. °· Sleep on a regular schedule. Get 7-9 hours of sleep, or the amount recommended by your health care provider. °SEEK MEDICAL CARE IF:  °· Your symptoms are not helped by medicine. °· You have a  headache that is different from the usual headache. °· You have nausea or you vomit. °· You have a fever. °SEEK IMMEDIATE MEDICAL CARE IF:  °· Your headache becomes severe. °· You have repeated vomiting. °· You have a stiff neck. °· You have a loss of vision. °· You have problems with speech. °· You have pain in the eye or ear. °· You have muscular weakness or loss of muscle control. °· You lose your balance or have trouble walking. °· You feel faint or pass out. °· You have confusion. °  °This information is not intended to replace advice given to you by your health care provider. Make sure you discuss any questions you have with your health care provider. °  °Document Released: 01/06/2005 Document Revised: 09/27/2014 Document Reviewed: 05/01/2014 °Elsevier Interactive Patient Education ©2016 [employer] ° °Nonspecific Chest Pain  °Chest pain can be caused by many different conditions. There is always a chance that your pain could be related to something serious, such as a heart attack or a blood clot in your lungs. Chest pain can also be caused by conditions that are not life-threatening. If you have chest pain, it is very important to follow up with your health care provider. °CAUSES  °Chest pain can be caused by: °· Heartburn. °· Pneumonia or bronchitis. °· Anxiety or stress. °· Inflammation around your heart (pericarditis) or lung (pleuritis or pleurisy). °· A blood clot in your lung. °· A collapsed   lung (pneumothorax). It can develop suddenly on its own (spontaneous pneumothorax) or from trauma to the chest. °· Shingles infection (varicella-zoster virus). °· Heart attack. °· Damage to the bones, muscles, and cartilage that make up your chest wall. This can include: °¨ Bruised bones due to injury. °¨ Strained muscles or cartilage due to frequent or repeated coughing or overwork. °¨ Fracture to one or more ribs. °¨ Sore cartilage due to inflammation (costochondritis). °RISK FACTORS  °Risk factors for chest  pain may include: °· Activities that increase your risk for trauma or injury to your chest. °· Respiratory infections or conditions that cause frequent coughing. °· Medical conditions or overeating that can cause heartburn. °· Heart disease or family history of heart disease. °· Conditions or health behaviors that increase your risk of developing a blood clot. °· Having had chicken pox (varicella zoster). °SIGNS AND SYMPTOMS °Chest pain can feel like: °· Burning or tingling on the surface of your chest or deep in your chest. °· Crushing, pressure, aching, or squeezing pain. °· Dull or sharp pain that is worse when you move, cough, or take a deep breath. °· Pain that is also felt in your back, neck, shoulder, or arm, or pain that spreads to any of these areas. °Your chest pain may come and go, or it may stay constant. °DIAGNOSIS °Lab tests or other studies may be needed to find the cause of your pain. Your health care provider may have you take a test called an ambulatory ECG (electrocardiogram). An ECG records your heartbeat patterns at the time the test is performed. You may also have other tests, such as: °· Transthoracic echocardiogram (TTE). During echocardiography, sound waves are used to create a picture of all of the heart structures and to look at how blood flows through your heart. °· Transesophageal echocardiogram (TEE). This is a more advanced imaging test that obtains images from inside your body. It allows your health care provider to see your heart in finer detail. °· Cardiac monitoring. This allows your health care provider to monitor your heart rate and rhythm in real time. °· Holter monitor. This is a portable device that records your heartbeat and can help to diagnose abnormal heartbeats. It allows your health care provider to track your heart activity for several days, if needed. °· Stress tests. These can be done through exercise or by taking medicine that makes your heart beat more  quickly. °· Blood tests. °· Imaging tests. °TREATMENT  °Your treatment depends on what is causing your chest pain. Treatment may include: °· Medicines. These may include: °¨ Acid blockers for heartburn. °¨ Anti-inflammatory medicine. °¨ Pain medicine for inflammatory conditions. °¨ Antibiotic medicine, if an infection is present. °¨ Medicines to dissolve blood clots. °¨ Medicines to treat coronary artery disease. °· Supportive care for conditions that do not require medicines. This may include: °¨ Resting. °¨ Applying heat or cold packs to injured areas. °¨ Limiting activities until pain decreases. °HOME CARE INSTRUCTIONS °· If you were prescribed an antibiotic medicine, finish it all even if you start to feel better. °· Avoid any activities that bring on chest pain. °· Do not use any tobacco products, including cigarettes, chewing tobacco, or electronic cigarettes. If you need help quitting, ask your health care provider. °· Do not drink alcohol. °· Take medicines only as directed by your health care provider. °· Keep all follow-up visits as directed by your health care provider. This is important. This includes any further testing if your chest pain   does not go away. °· If heartburn is the cause for your chest pain, you may be told to keep your head raised (elevated) while sleeping. This reduces the chance that acid will go from your stomach into your esophagus. °· Make lifestyle changes as directed by your health care provider. These may include: °¨ Getting regular exercise. Ask your health care provider to suggest some activities that are safe for you. °¨ Eating a heart-healthy diet. A registered dietitian can help you to learn healthy eating options. °¨ Maintaining a healthy weight. °¨ Managing diabetes, if necessary. °¨ Reducing stress. °SEEK MEDICAL CARE IF: °· Your chest pain does not go away after treatment. °· You have a rash with blisters on your chest. °· You have a fever. °SEEK IMMEDIATE MEDICAL CARE  IF:  °· Your chest pain is worse. °· You have an increasing cough, or you cough up blood. °· You have severe abdominal pain. °· You have severe weakness. °· You faint. °· You have chills. °· You have sudden, unexplained chest discomfort. °· You have sudden, unexplained discomfort in your arms, back, neck, or jaw. °· You have shortness of breath at any time. °· You suddenly start to sweat, or your skin gets clammy. °· You feel nauseous or you vomit. °· You suddenly feel light-headed or dizzy. °· Your heart begins to beat quickly, or it feels like it is skipping beats. °These symptoms may represent a serious problem that is an emergency. Do not wait to see if the symptoms will go away. Get medical help right away. Call your local emergency services (911 in the [place] Do not drive yourself to the hospital. °  °This information is not intended to replace advice given to you by your health care provider. Make sure you discuss any questions you have with your health care provider. °  °Document Released: 10/16/2004 Document Revised: 01/27/2014 Document Reviewed: 08/12/2013 °Elsevier Interactive Patient Education ©2016 Else[employer] Inc[employer] ° °

## 2015-04-15 NOTE — ED Provider Notes (Signed)
CSN: [id number]     Arrival date & time 04/15/15  1748 History   First MD Initiated Contact with Patient 04/15/15 1801     Chief Complaint  Patient presents with  . Hypertension      Patient is a 55 y.o. male presenting with hypertension. The history is provided by the patient.  Hypertension This is a recurrent problem. Associated symptoms include chest pain and headaches. Pertinent negatives include no abdominal pain and no shortness of breath.  Patient presents with some high blood pressure at times also has had headaches and dizziness at times. Also has low blood pressure times. Also has occasional chest pain that goes to his left shoulder. He's been seen in the ER for dizziness. Over the last couple months he said his blood pressure medicines adjusted both decreases and then increases. He has lost around 30 pounds by eating better and exercising more. He was seen in the ER about a week ago for headaches and mild increase in blood pressure. Primary care doctor is change his medicines around a little bit. His lisinopril is been increased. He had previously been on atenolol but is no longer on that. States his blood pressure up to [id number] systolic but also his had blood pressures a while 7. States he gets occasional dull headaches in the back of his head. States he also has occasional dull chest pain that goes to the left shoulder. He occasionally get episodes of it with exertion but does not always come on with exertion. No cough. No swelling in his legs.  Past Medical History  Diagnosis Date  . Chest pain   . Shoulder pain   . Depression   . Erectile dysfunction   . GERD (gastroesophageal reflux disease)   . Hyperlipidemia   . HTN (hypertension)   . Insomnia   . Dysuria   . Gastroenteritis   . Insomnia   . Onychomycosis   . High cholesterol   . Gout   . Prostate cancer ([place])   . Basal cell carcinoma   . Nephrolithiasis    Past Surgical History  Procedure Laterality Date  .  Prostatectomy    . Cystoscopy w/ ureteral stent placement Left 01/30/2014    Procedure: CYSTOSCOPY WITH RETROGRADE PYELOGRAM/URETEROSCOPY/URETERAL STENT PLACEMENT;  Surgeon: Bernestine Amass, MD;  Location: [facility];  Service: Urology;  Laterality: Left;  . Holmium laser application Left [id number]    Procedure: HOLMIUM LASER APPLICATION;  Surgeon: Bernestine Amass, MD;  Location: [facility];  Service: Urology;  Laterality: Left;   History reviewed. No pertinent family history. Social History  Substance Use Topics  . Smoking status: Never Smoker   . Smokeless tobacco: None  . Alcohol Use: No     Comment: "a beer once in a while"     Review of Systems  Constitutional: Negative for activity change, appetite change and unexpected weight change.  Eyes: Negative for pain.  Respiratory: Negative for chest tightness and shortness of breath.   Cardiovascular: Positive for chest pain. Negative for leg swelling.  Gastrointestinal: Negative for nausea, vomiting, abdominal pain and diarrhea.  Genitourinary: Negative for flank pain.  Musculoskeletal: Negative for back pain and neck stiffness.  Skin: Negative for rash.  Neurological: Positive for headaches. Negative for weakness and numbness.  Psychiatric/Behavioral: Negative for behavioral problems.      Allergies  Review of patient's allergies indicates no known allergies.  Home Medications   Prior to Admission medications   Medication Sig Start Date End Date Taking?  Authorizing Provider  allopurinol (ZYLOPRIM) 300 MG tablet Take 300 mg by mouth daily.   Yes Historical Provider, MD  ALPRAZolam Duanne Moron) 1 MG tablet Take 0.5 mg by mouth daily as needed for anxiety.  12/07/13  Yes Historical Provider, MD  ibuprofen (ADVIL,MOTRIN) 200 MG tablet Take 400-600 mg by mouth every 6 (six) hours as needed for mild pain.   Yes Historical Provider, MD  lisinopril (PRINIVIL,ZESTRIL) 20 MG tablet Take 20 mg by mouth 2 (two) times daily.   Yes Historical Provider, MD   omeprazole (PRILOSEC OTC) 20 MG tablet Take 20 mg by mouth daily.   Yes Historical Provider, MD  simvastatin (ZOCOR) 40 MG tablet Take 40 mg by mouth every evening.   Yes Historical Provider, MD  tetrahydrozoline 0.05 % ophthalmic solution Place 1 drop into both eyes daily as needed (Burning Eyes).   Yes Historical Provider, MD  meclizine (ANTIVERT) 25 MG tablet Take 1 tablet (25 mg total) by mouth 3 (three) times daily as needed for dizziness. 12/20/14   Fredia Sorrow, MD  traMADol (ULTRAM) 50 MG tablet Take 1 tablet (50 mg total) by mouth every 6 (six) hours as needed. 04/06/15   Orpah Greek, MD   BP 114/67 mmHg  Pulse 65  Temp(Src) 98.1 F (36.7 C) (Oral)  Resp 15  Ht 5\' 10"  (1.778 m)  Wt 233 lb (105.688 kg)  BMI 33.43 kg/m2  SpO2 98% Physical Exam  Constitutional: He is oriented to person, place, and time. He appears well-developed and well-nourished.  HENT:  Head: Normocephalic and atraumatic.  Neck: Normal range of motion.  Cardiovascular: Normal rate and regular rhythm.   Pulmonary/Chest: Effort normal and breath sounds normal. He exhibits no tenderness.  Abdominal: Soft. Bowel sounds are normal. He exhibits no distension and no mass. There is no tenderness. There is no rebound and no guarding.  Musculoskeletal: He exhibits no edema.  Neurological: He is alert and oriented to person, place, and time. No cranial nerve deficit.  Skin: Skin is warm. No rash noted.  Psychiatric: He has a normal mood and affect.  Nursing note and vitals reviewed.   ED Course  Procedures (including critical care time) Labs Review Labs Reviewed  Randolm Idol, ED    Imaging Review Dg Chest 2 View  04/15/2015  CLINICAL DATA:  55 year old male with chest pain EXAM: CHEST  2 VIEW COMPARISON:  None. FINDINGS: The heart size and mediastinal contours are within normal limits. Both lungs are clear. The visualized skeletal structures are unremarkable. IMPRESSION: No active  cardiopulmonary disease. Electronically Signed   By: Anner Crete M.D.   On: 04/15/2015 18:49   I have personally reviewed and evaluated these images and lab results as part of my medical decision-making.   EKG Interpretation   Date/Time:  Sunday April 15 2015 18:32:13 EDT Ventricular Rate:  62 PR Interval:  208 QRS Duration: 88 QT Interval:  401 QTC Calculation: 407 R Axis:   77 Text Interpretation:  Sinus rhythm Borderline prolonged PR interval  Baseline wander in lead(s) V1 Confirmed by Alvino Chapel  MD, Imane Burrough [phone])  on 04/15/2015 6:43:41 PM      MDM   Final diagnoses:  Nonintractable headache, unspecified chronicity pattern, unspecified headache type  Chest pain, unspecified chest pain type    Patient with headache. Has been seen for same. Has had blood pressures been high and low. Chest x-ray reassuring. Has some dull chest pain. Negative troponin. Will discharge home to follow-up with his primary care doctor  Davonna Belling, MD 04/15/15 [phone]

## 2015-05-07 IMAGING — CR DG ABDOMEN 1V
2 series · 2 of 2 positions shown · non-contrast
Comparison: None.

CLINICAL DATA: Lower quadrant pain

EXAM:
ABDOMEN - 1 VIEW

[view not recorded (1 of 2)]
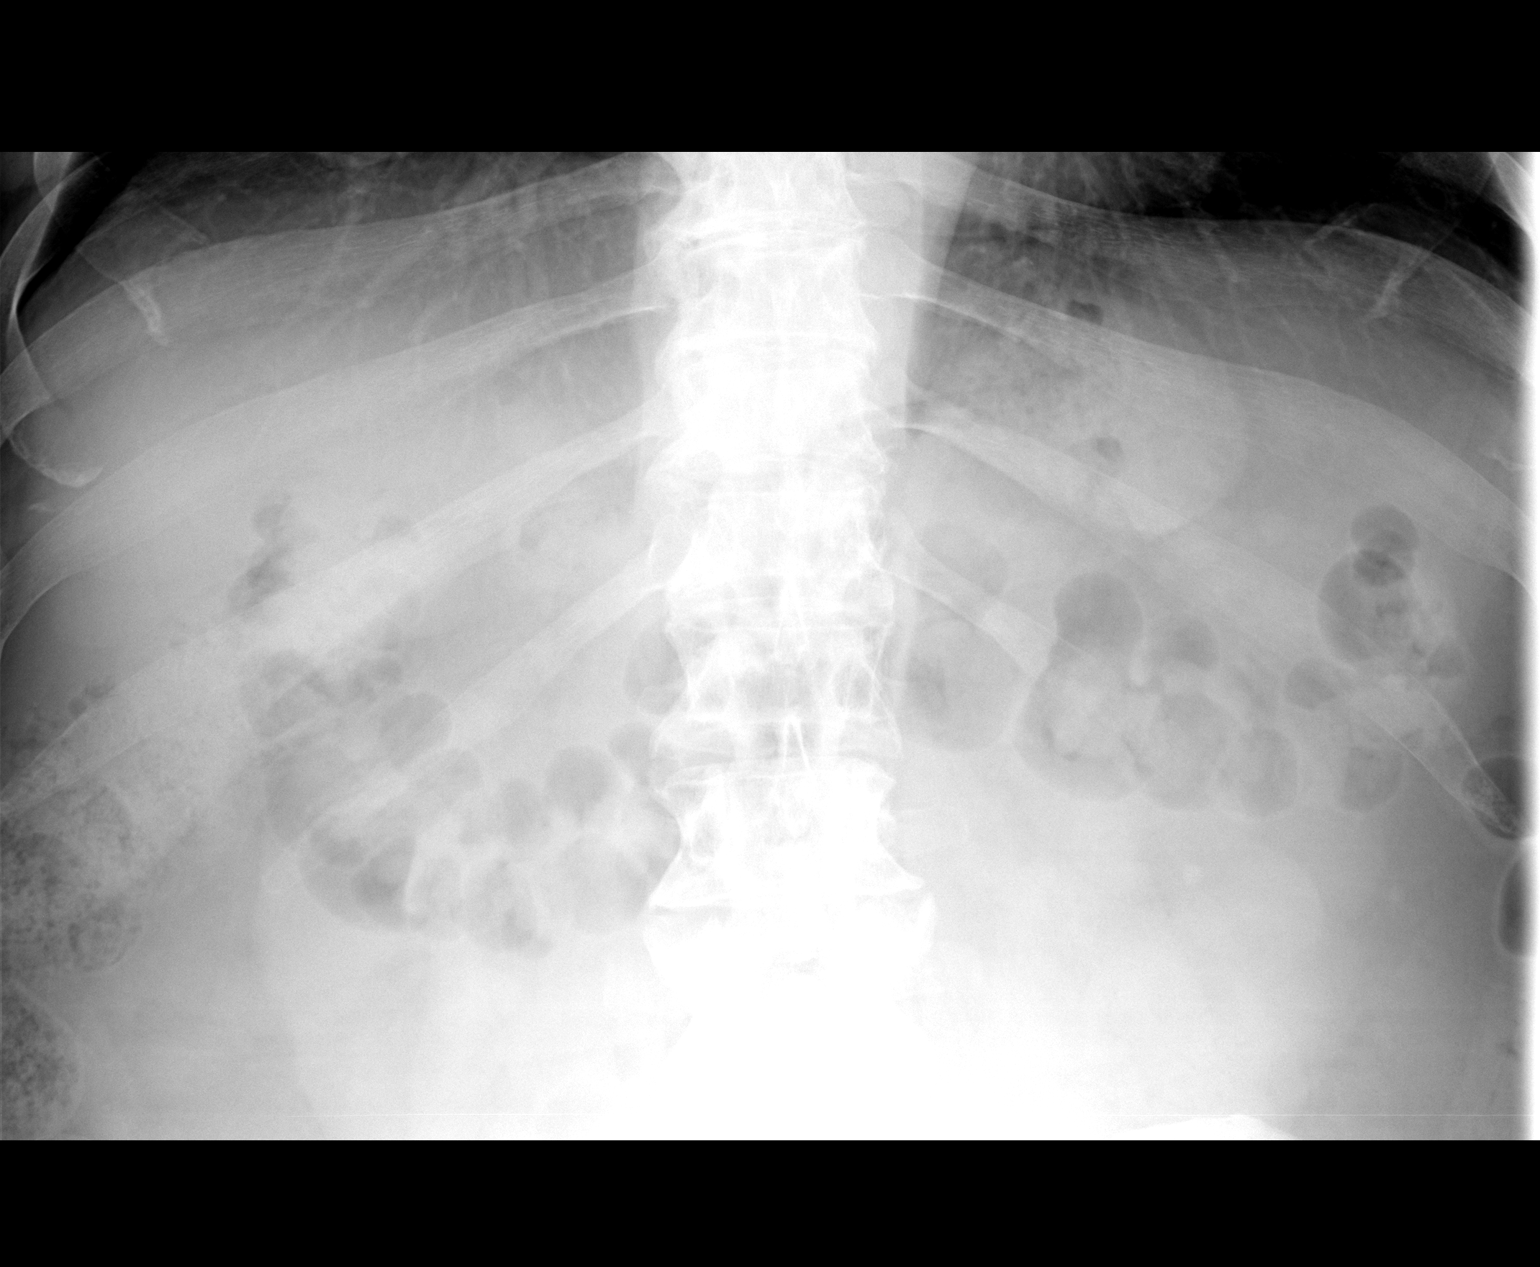

[view not recorded (2 of 2)]
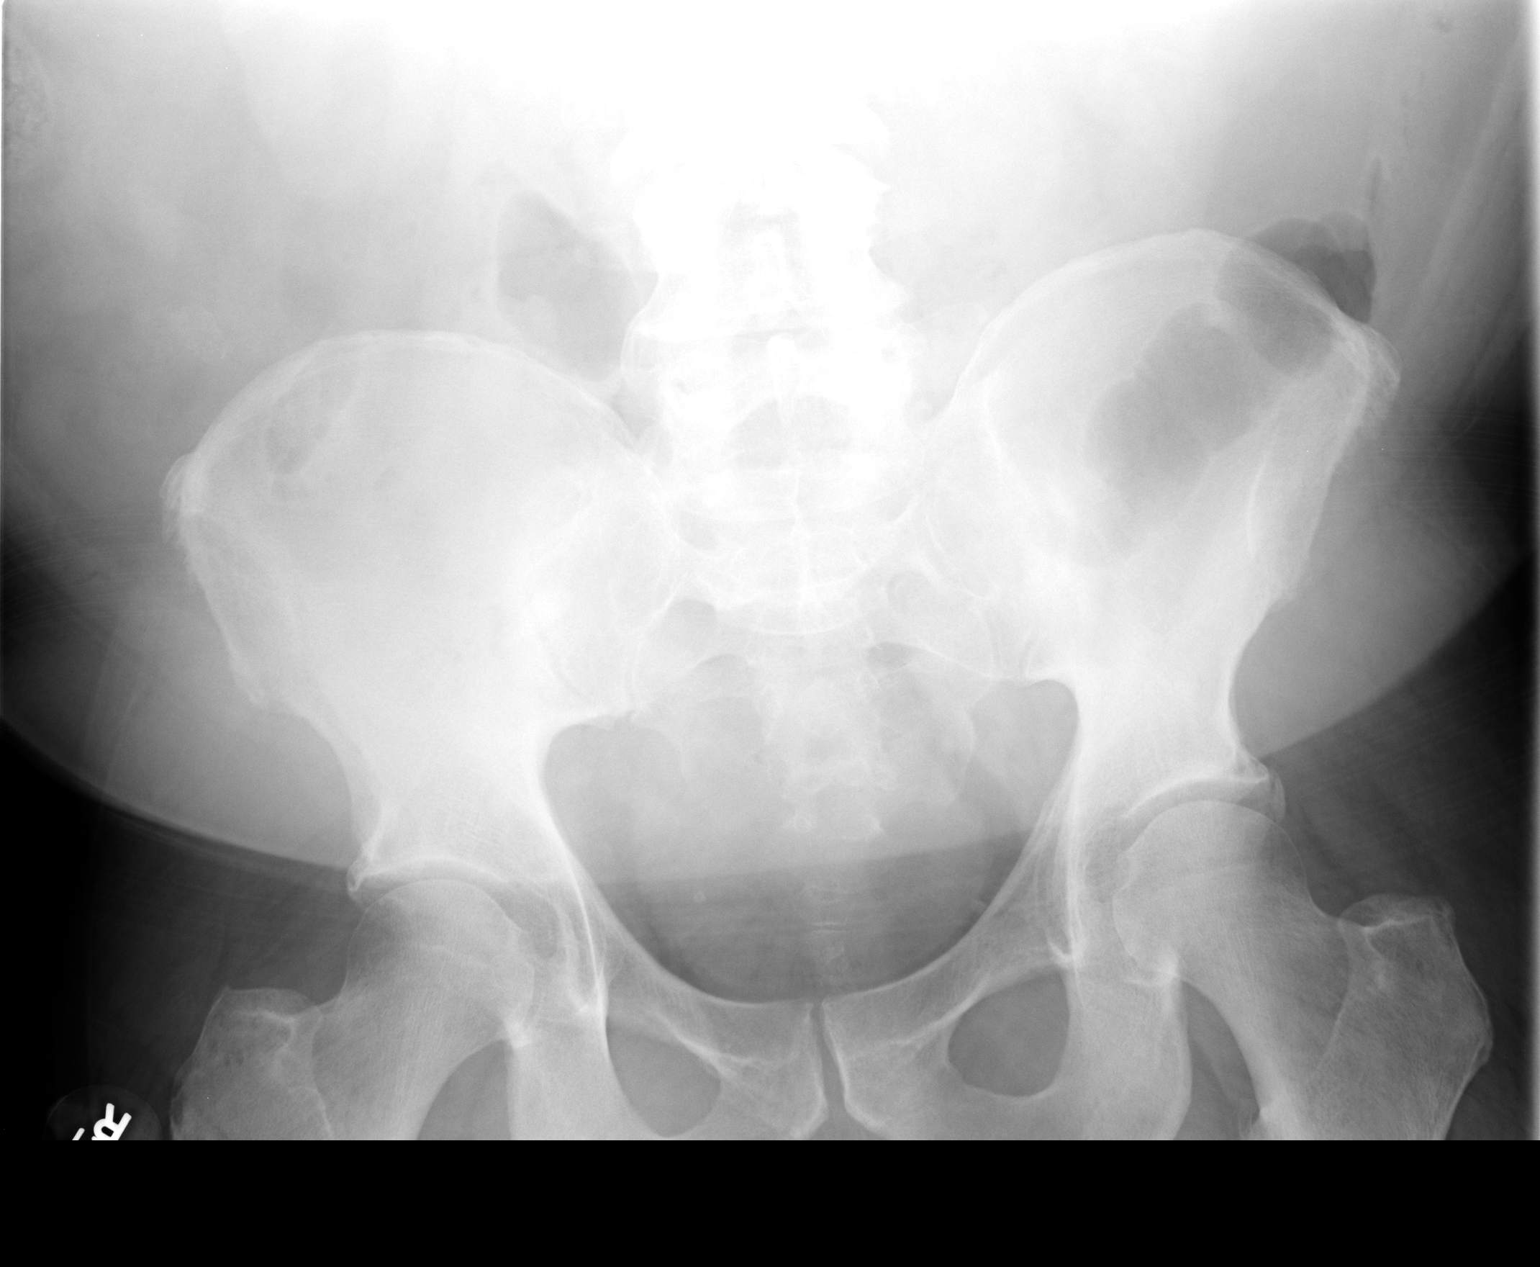

[2 of 2 positions shown; findings below may reference images not displayed]

FINDINGS: The bowel gas pattern is nonspecific without evidence of obstruction
or ileus. No abnormal bowel wall thickening identified. No free
intraperitoneal air. No soft tissue mass. A 5 mm calcific density
overlying the left renal shadow is suspicious for a left renal
calculus. No other abnormal calcifications identified.

Mild degenerative changes noted within the visualized spine. No
acute osseous abnormality.
IMPRESSION: 1. Nonobstructive bowel gas pattern with no radiographic evidence of
acute intra-abdominal process.
2. 5 mm calcification overlying the left renal shadow, suspicious
for a left renal calculus.

## 2015-05-11 ENCOUNTER — Encounter: Payer: Self-pay | Admitting: Gastroenterology

## 2015-06-21 ENCOUNTER — Encounter: Payer: 59 | Admitting: Gastroenterology

## 2015-06-26 ENCOUNTER — Encounter (HOSPITAL_COMMUNITY): Payer: Self-pay | Admitting: Emergency Medicine

## 2015-06-26 ENCOUNTER — Emergency Department (HOSPITAL_COMMUNITY)
Admission: EM | Admit: 2015-06-26 | Discharge: 2015-06-27 | Disposition: A | Discharge status: Home / Self Care | Payer: 59 | Attending: Emergency Medicine | Admitting: Emergency Medicine

## 2015-06-26 DIAGNOSIS — I10 Essential (primary) hypertension: Secondary | ICD-10-CM | POA: Insufficient documentation

## 2015-06-26 DIAGNOSIS — E785 Hyperlipidemia, unspecified: Secondary | ICD-10-CM | POA: Insufficient documentation

## 2015-06-26 DIAGNOSIS — Z79899 Other long term (current) drug therapy: Secondary | ICD-10-CM | POA: Insufficient documentation

## 2015-06-26 DIAGNOSIS — M25561 Pain in right knee: Secondary | ICD-10-CM | POA: Diagnosis present

## 2015-06-26 DIAGNOSIS — F329 Major depressive disorder, single episode, unspecified: Secondary | ICD-10-CM | POA: Diagnosis not present

## 2015-06-26 DIAGNOSIS — Z791 Long term (current) use of non-steroidal anti-inflammatories (NSAID): Secondary | ICD-10-CM | POA: Diagnosis not present

## 2015-06-26 DIAGNOSIS — Z8546 Personal history of malignant neoplasm of prostate: Secondary | ICD-10-CM | POA: Diagnosis not present

## 2015-06-26 LAB — CBC WITH DIFFERENTIAL/PLATELET
Basophils Absolute: 0 10*3/uL (ref 0.0–0.1)
Basophils Relative: 0 %
Eosinophils Absolute: 0 10*3/uL (ref 0.0–0.7)
Eosinophils Relative: 0 %
HEMATOCRIT: 43.8 % (ref 39.0–52.0)
HEMOGLOBIN: 14.7 g/dL (ref 13.0–17.0)
LYMPHS ABS: 0.8 10*3/uL (ref 0.7–4.0)
LYMPHS PCT: 6 %
MCH: 29.8 pg (ref 26.0–34.0)
MCHC: 33.6 g/dL (ref 30.0–36.0)
MCV: 88.8 fL (ref 78.0–100.0)
MONO ABS: 0.2 10*3/uL (ref 0.1–1.0)
MONOS PCT: 2 %
NEUTROS ABS: 12.3 10*3/uL — AB (ref 1.7–7.7)
NEUTROS PCT: 92 %
Platelets: 290 10*3/uL (ref 150–400)
RBC: 4.93 MIL/uL (ref 4.22–5.81)
RDW: 12.9 % (ref 11.5–15.5)
WBC: 13.4 10*3/uL — ABNORMAL HIGH (ref 4.0–10.5)

## 2015-06-26 LAB — BASIC METABOLIC PANEL
Anion gap: 8 (ref 5–15)
BUN: 18 mg/dL (ref 6–20)
CALCIUM: 9.1 mg/dL (ref 8.9–10.3)
CHLORIDE: 105 mmol/L (ref 101–111)
CO2: 22 mmol/L (ref 22–32)
CREATININE: 1.09 mg/dL (ref 0.61–1.24)
GFR calc non Af Amer: >60 mL/min (ref >60)
GLUCOSE: 118 mg/dL — AB (ref 65–99)
Potassium: 4.2 mmol/L (ref 3.5–5.1)
Sodium: 135 mmol/L (ref 135–145)

## 2015-06-26 LAB — D-DIMER, QUANTITATIVE: D-Dimer, Quant: 0.4 ug/mL-FEU (ref 0.00–0.50)

## 2015-06-26 LAB — URIC ACID: Uric Acid, Serum: 4.5 mg/dL (ref 4.4–7.6)

## 2015-06-26 MED ORDER — OXYCODONE-ACETAMINOPHEN 5-325 MG PO TABS
2.0000 | ORAL_TABLET | Freq: Once | ORAL | Status: AC
  Administered 2015-06-26: 2 via ORAL
  Filled 2015-06-26: qty 2

## 2015-06-26 MED ORDER — OXYCODONE-ACETAMINOPHEN 5-325 MG PO TABS: 2.0000 | ORAL_TABLET | ORAL | Status: DC | PRN

## 2015-06-26 NOTE — Discharge Instructions (Signed)
How to Use a Knee Brace A knee brace is a device that you wear to support your knee, especially if the knee is healing after an injury or surgery. There are several types of knee braces. Some are designed to prevent an injury (prophylactic brace). These are often worn during sports. Others support an injured knee (functional brace) or keep it still while it heals (rehabilitative brace). People with severe arthritis of the knee may benefit from a brace that takes some pressure off the knee (unloader brace). Most knee braces are made from a combination of cloth and metal or plastic.  You may need to wear a knee brace to:  Relieve knee pain.  Help your knee support your weight (improve stability).  Help you walk farther (improve mobility).  Prevent injury.  Support your knee while it heals from surgery or from an injury. RISKS AND COMPLICATIONS Generally, knee braces are very safe to wear. However, problems may occur, including:  Skin irritation that may lead to infection.  Making your condition worse if you wear the brace in the wrong way. HOW TO USE A KNEE BRACE Different braces will have different instructions for use. Your health care provider will tell you or show you:  How to put on your brace.  How to adjust the brace.  When and how often to wear the brace.  How to remove the brace.  If you will need any assistive devices in addition to the brace, such as crutches or a cane. In general, your brace should:  Have the hinge of the brace line up with the bend of your knee.  Have straps, hooks, or tapes that fasten snugly around your leg.  Not feel too tight or too loose. HOW TO CARE FOR A KNEE BRACE  Check your brace often for signs of damage, such as loose connections or attachments. Your knee brace may get damaged or wear out during normal use.  Wash the fabric parts of your brace with soap and water.  Read the insert that comes with your brace for other specific care  instructions. SEEK MEDICAL CARE IF:  Your knee brace is too loose or too tight and you cannot adjust it.  Your knee brace causes skin redness, swelling, bruising, or irritation.  Your knee brace is not helping.  Your knee brace is making your knee pain worse.   This information is not intended to replace advice given to you by your health care provider. Make sure you discuss any questions you have with your health care provider.   Document Released: 03/29/2003 Document Revised: 09/27/2014 Document Reviewed: 05/01/2014 Elsevier Interactive Patient Education 2016 [place] Pain Joint pain, which is also called arthralgia, can be caused by many things. Joint pain often goes away when you follow your health care provider's instructions for relieving pain at home. However, joint pain can also be caused by conditions that require further treatment. Common causes of joint pain include:  Bruising in the area of the joint.  Overuse of the joint.  Wear and tear on the joints that occur with aging (osteoarthritis).  Various other forms of arthritis.  A buildup of a crystal form of uric acid in the joint (gout).  Infections of the joint (septic arthritis) or of the bone (osteomyelitis). Your health care provider may recommend medicine to help with the pain. If your joint pain continues, additional tests may be needed to diagnose your condition. HOME CARE INSTRUCTIONS Watch your condition for any changes.  Follow these instructions as directed to lessen the pain that you are feeling.  Take medicines only as directed by your health care provider.  Rest the affected area for as long as your health care provider says that you should. If directed to do so, raise the painful joint above the level of your heart while you are sitting or lying down.  Do not do things that cause or worsen pain.  If directed, apply ice to the painful area:  Put ice in a plastic bag.  Place a towel  between your skin and the bag.  Leave the ice on for 20 minutes, 2-3 times per day.  Wear an elastic bandage, splint, or sling as directed by your health care provider. Loosen the elastic bandage or splint if your fingers or toes become numb and tingle, or if they turn cold and blue.  Begin exercising or stretching the affected area as directed by your health care provider. Ask your health care provider what types of exercise are safe for you.  Keep all follow-up visits as directed by your health care provider. This is important. SEEK MEDICAL CARE IF:  Your pain increases, and medicine does not help.  Your joint pain does not improve within 3 days.  You have increased bruising or swelling.  You have a fever.  You lose 10 lb (4.5 kg) or more without trying. SEEK IMMEDIATE MEDICAL CARE IF:  You are not able to move the joint.  Your fingers or toes become numb or they turn cold and blue.   This information is not intended to replace advice given to you by your health care provider. Make sure you discuss any questions you have with your health care provider.   Document Released: 01/06/2005 Document Revised: 01/27/2014 Document Reviewed: 10/18/2013 Elsevier Interactive Patient Education [employer].

## 2015-06-26 NOTE — ED Provider Notes (Signed)
CSN: [id number]     Arrival date & time 06/26/15  2114 History   First MD Initiated Contact with Patient 06/26/15 2142     Chief Complaint  Patient presents with  . Knee Pain     (Consider location/radiation/quality/duration/timing/severity/associated sxs/prior Treatment) Patient is a 55 y.o. male presenting with knee pain. The history is provided by the patient. No language interpreter was used.  Knee Pain Location:  Knee Time since incident:  4 days Injury: no   Knee location:  R knee Pain details:    Quality:  Aching   Radiates to:  Does not radiate   Severity:  Severe   Onset quality:  Gradual   Duration:  4 days   Timing:  Constant   Progression:  Worsening Chronicity:  New Dislocation: no   Foreign body present:  No foreign bodies Relieved by:  Nothing Worsened by:  Nothing tried Ineffective treatments:  None tried Associated symptoms: no back pain   Risk factors: no concern for non-accidental trauma   Pt complains of pain in his right knee.  Pt saw his Md yesterday and was started on prednisone.  Pt reports pain with walking.  Pt complains of swelling.  His MD did xray which showed arthritis.  Pt is worried that he has a blood clot.  Pt worried about gout.  Pt reports his provider advised they will schedule mri if not improved with prednisone.  Pt complains of a headache and feeling flushed.  Past Medical History  Diagnosis Date  . Chest pain   . Shoulder pain   . Depression   . Erectile dysfunction   . GERD (gastroesophageal reflux disease)   . Hyperlipidemia   . HTN (hypertension)   . Insomnia   . Dysuria   . Gastroenteritis   . Insomnia   . Onychomycosis   . High cholesterol   . Gout   . Prostate cancer ([place])   . Basal cell carcinoma   . Nephrolithiasis    Past Surgical History  Procedure Laterality Date  . Prostatectomy    . Cystoscopy w/ ureteral stent placement Left 01/30/2014    Procedure: CYSTOSCOPY WITH RETROGRADE PYELOGRAM/URETEROSCOPY/URETERAL  STENT PLACEMENT;  Surgeon: Bernestine Amass, MD;  Location: [facility];  Service: Urology;  Laterality: Left;  . Holmium laser application Left [id number]    Procedure: HOLMIUM LASER APPLICATION;  Surgeon: Bernestine Amass, MD;  Location: [facility];  Service: Urology;  Laterality: Left;   No family history on file. Social History  Substance Use Topics  . Smoking status: Never Smoker   . Smokeless tobacco: None  . Alcohol Use: No     Comment: "a beer once in a while"     Review of Systems  Cardiovascular: Positive for leg swelling.  Musculoskeletal: Negative for back pain.  All other systems reviewed and are negative.     Allergies  Review of patient's allergies indicates no known allergies.  Home Medications   Prior to Admission medications   Medication Sig Start Date End Date Taking? Authorizing Provider  allopurinol (ZYLOPRIM) 300 MG tablet Take 300 mg by mouth daily.    Historical Provider, MD  ALPRAZolam Duanne Moron) 1 MG tablet Take 0.5 mg by mouth daily as needed for anxiety.  12/07/13   Historical Provider, MD  ibuprofen (ADVIL,MOTRIN) 200 MG tablet Take 400-600 mg by mouth every 6 (six) hours as needed for mild pain.    Historical Provider, MD  lisinopril (PRINIVIL,ZESTRIL) 20 MG tablet Take 20 mg by mouth  2 (two) times daily.    Historical Provider, MD  meclizine (ANTIVERT) 25 MG tablet Take 1 tablet (25 mg total) by mouth 3 (three) times daily as needed for dizziness. 12/20/14   Fredia Sorrow, MD  omeprazole (PRILOSEC OTC) 20 MG tablet Take 20 mg by mouth daily.    Historical Provider, MD  simvastatin (ZOCOR) 40 MG tablet Take 40 mg by mouth every evening.    Historical Provider, MD  tetrahydrozoline 0.05 % ophthalmic solution Place 1 drop into both eyes daily as needed ([place]ing Eyes).    Historical Provider, MD  traMADol (ULTRAM) 50 MG tablet Take 1 tablet (50 mg total) by mouth every 6 (six) hours as needed. 04/06/15   Orpah Greek, MD   BP 169/100 mmHg  Pulse 95   Temp(Src) 98.4 F (36.9 C) (Oral)  Resp 18  Ht 5\' 10"  (1.778 m)  Wt 108.863 kg  BMI 34.44 kg/m2  SpO2 99% Physical Exam  Constitutional: He is oriented to person, place, and time. He appears well-developed and well-nourished.  HENT:  Head: Normocephalic.  Eyes: EOM are normal.  Neck: Normal range of motion.  Pulmonary/Chest: Effort normal.  Abdominal: He exhibits no distension.  Musculoskeletal: He exhibits tenderness.  Slight effusion right knee, no erythema. No increased warmth. Calf negative homan's.  No instability,  nv and ns intact  Neurological: He is alert and oriented to person, place, and time.  Skin: Skin is warm.  Psychiatric: He has a normal mood and affect.  Nursing note and vitals reviewed.   ED Course  Procedures (including critical care time) Labs Review Labs Reviewed - No data to display  Imaging Review No results found. I have personally reviewed and evaluated these images and lab results as part of my medical decision-making.   EKG Interpretation None      MDM   Final diagnoses:  Knee pain, right    Ddimer is negative,  homan's negative.  I do not think pt has a dvt.  Symptoms are limited to knee joint.  No sign of septic joint.  Possible gout but more likely meniscus issue.   An After Visit Summary was printed and given to the patient. Meds ordered this encounter  Medications  . oxyCODONE-acetaminophen (PERCOCET/ROXICET) 5-325 MG per tablet 2 tablet    Sig:   . oxyCODONE-acetaminophen (PERCOCET/ROXICET) 5-325 MG tablet    Sig: Take 2 tablets by mouth every 4 (four) hours as needed for severe pain.    Dispense:  16 tablet    Refill:  0    Order Specific Question:  Supervising Provider    Answer:  Noemi Chapel [[id number]]  Pt advised to see his Md for recheck in 2-3 days.  Pt blood pressure normalized with pain medication.  Pt reports some decrease in pain.     Hollace Kinnier [place], PA-C 06/27/15 0003  Milton Ferguson, MD 06/28/15 1229

## 2015-06-26 NOTE — ED Notes (Signed)
Pt c/o R knee pain and swelling since Saturday. No injury noted. Pt stated he was seen at his MD today where he was given xray to that knee and blood work was taken. States xray only showed arthritis.

## 2015-07-07 ENCOUNTER — Encounter (HOSPITAL_COMMUNITY): Payer: Self-pay | Admitting: Emergency Medicine

## 2015-07-07 ENCOUNTER — Emergency Department (HOSPITAL_COMMUNITY): Payer: 59

## 2015-07-07 ENCOUNTER — Other Ambulatory Visit: Payer: Self-pay

## 2015-07-07 ENCOUNTER — Emergency Department (HOSPITAL_COMMUNITY)
Admission: EM | Admit: 2015-07-07 | Discharge: 2015-07-07 | Disposition: A | Discharge status: Home / Self Care | Payer: 59 | Attending: Emergency Medicine | Admitting: Emergency Medicine

## 2015-07-07 DIAGNOSIS — I10 Essential (primary) hypertension: Secondary | ICD-10-CM | POA: Insufficient documentation

## 2015-07-07 DIAGNOSIS — Z791 Long term (current) use of non-steroidal anti-inflammatories (NSAID): Secondary | ICD-10-CM | POA: Diagnosis not present

## 2015-07-07 DIAGNOSIS — Z8546 Personal history of malignant neoplasm of prostate: Secondary | ICD-10-CM | POA: Diagnosis not present

## 2015-07-07 DIAGNOSIS — F329 Major depressive disorder, single episode, unspecified: Secondary | ICD-10-CM | POA: Diagnosis not present

## 2015-07-07 DIAGNOSIS — Z79891 Long term (current) use of opiate analgesic: Secondary | ICD-10-CM | POA: Diagnosis not present

## 2015-07-07 DIAGNOSIS — E785 Hyperlipidemia, unspecified: Secondary | ICD-10-CM | POA: Diagnosis not present

## 2015-07-07 DIAGNOSIS — Z79899 Other long term (current) drug therapy: Secondary | ICD-10-CM | POA: Diagnosis not present

## 2015-07-07 DIAGNOSIS — R0789 Other chest pain: Secondary | ICD-10-CM | POA: Diagnosis present

## 2015-07-07 DIAGNOSIS — R079 Chest pain, unspecified: Secondary | ICD-10-CM

## 2015-07-07 LAB — BASIC METABOLIC PANEL
ANION GAP: 5 (ref 5–15)
BUN: 19 mg/dL (ref 6–20)
CALCIUM: 8.9 mg/dL (ref 8.9–10.3)
CO2: 24 mmol/L (ref 22–32)
Chloride: 107 mmol/L (ref 101–111)
Creatinine, Ser: 1.25 mg/dL — ABNORMAL HIGH (ref 0.61–1.24)
Glucose, Bld: 99 mg/dL (ref 65–99)
Potassium: 4 mmol/L (ref 3.5–5.1)
Sodium: 136 mmol/L (ref 135–145)

## 2015-07-07 LAB — CBC
HCT: 42.5 % (ref 39.0–52.0)
HEMOGLOBIN: 14.3 g/dL (ref 13.0–17.0)
MCH: 30 pg (ref 26.0–34.0)
MCHC: 33.6 g/dL (ref 30.0–36.0)
MCV: 89.3 fL (ref 78.0–100.0)
Platelets: 232 10*3/uL (ref 150–400)
RBC: 4.76 MIL/uL (ref 4.22–5.81)
RDW: 12.8 % (ref 11.5–15.5)
WBC: 8.9 10*3/uL (ref 4.0–10.5)

## 2015-07-07 LAB — TROPONIN I

## 2015-07-07 NOTE — ED Notes (Signed)
Patient verbalizes understanding of discharge instructions, home care and follow up care. Patient out of department at this time. 

## 2015-07-07 NOTE — Discharge Instructions (Signed)
Nonspecific Chest Pain  °Chest pain can be caused by many different conditions. There is always a chance that your pain could be related to something serious, such as a heart attack or a blood clot in your lungs. Chest pain can also be caused by conditions that are not life-threatening. If you have chest pain, it is very important to follow up with your health care provider. °CAUSES  °Chest pain can be caused by: °· Heartburn. °· Pneumonia or bronchitis. °· Anxiety or stress. °· Inflammation around your heart (pericarditis) or lung (pleuritis or pleurisy). °· A blood clot in your lung. °· A collapsed lung (pneumothorax). It can develop suddenly on its own (spontaneous pneumothorax) or from trauma to the chest. °· Shingles infection (varicella-zoster virus). °· Heart attack. °· Damage to the bones, muscles, and cartilage that make up your chest wall. This can include: °¨ Bruised bones due to injury. °¨ Strained muscles or cartilage due to frequent or repeated coughing or overwork. °¨ Fracture to one or more ribs. °¨ Sore cartilage due to inflammation (costochondritis). °RISK FACTORS  °Risk factors for chest pain may include: °· Activities that increase your risk for trauma or injury to your chest. °· Respiratory infections or conditions that cause frequent coughing. °· Medical conditions or overeating that can cause heartburn. °· Heart disease or family history of heart disease. °· Conditions or health behaviors that increase your risk of developing a blood clot. °· Having had chicken pox (varicella zoster). °SIGNS AND SYMPTOMS °Chest pain can feel like: °· Burning or tingling on the surface of your chest or deep in your chest. °· Crushing, pressure, aching, or squeezing pain. °· Dull or sharp pain that is worse when you move, cough, or take a deep breath. °· Pain that is also felt in your back, neck, shoulder, or arm, or pain that spreads to any of these areas. °Your chest pain may come and go, or it may stay  constant. °DIAGNOSIS °Lab tests or other studies may be needed to find the cause of your pain. Your health care provider may have you take a test called an ambulatory ECG (electrocardiogram). An ECG records your heartbeat patterns at the time the test is performed. You may also have other tests, such as: °· Transthoracic echocardiogram (TTE). During echocardiography, sound waves are used to create a picture of all of the heart structures and to look at how blood flows through your heart. °· Transesophageal echocardiogram (TEE). This is a more advanced imaging test that obtains images from inside your body. It allows your health care provider to see your heart in finer detail. °· Cardiac monitoring. This allows your health care provider to monitor your heart rate and rhythm in real time. °· Holter monitor. This is a portable device that records your heartbeat and can help to diagnose abnormal heartbeats. It allows your health care provider to track your heart activity for several days, if needed. °· Stress tests. These can be done through exercise or by taking medicine that makes your heart beat more quickly. °· Blood tests. °· Imaging tests. °TREATMENT  °Your treatment depends on what is causing your chest pain. Treatment may include: °· Medicines. These may include: °¨ Acid blockers for heartburn. °¨ Anti-inflammatory medicine. °¨ Pain medicine for inflammatory conditions. °¨ Antibiotic medicine, if an infection is present. °¨ Medicines to dissolve blood clots. °¨ Medicines to treat coronary artery disease. °· Supportive care for conditions that do not require medicines. This may include: °¨ Resting. °¨ Applying heat   or cold packs to injured areas. °¨ Limiting activities until pain decreases. °HOME CARE INSTRUCTIONS °· If you were prescribed an antibiotic medicine, finish it all even if you start to feel better. °· Avoid any activities that bring on chest pain. °· Do not use any tobacco products, including  cigarettes, chewing tobacco, or electronic cigarettes. If you need help quitting, ask your health care provider. °· Do not drink alcohol. °· Take medicines only as directed by your health care provider. °· Keep all follow-up visits as directed by your health care provider. This is important. This includes any further testing if your chest pain does not go away. °· If heartburn is the cause for your chest pain, you may be told to keep your head raised (elevated) while sleeping. This reduces the chance that acid will go from your stomach into your esophagus. °· Make lifestyle changes as directed by your health care provider. These may include: °¨ Getting regular exercise. Ask your health care provider to suggest some activities that are safe for you. °¨ Eating a heart-healthy diet. A registered dietitian can help you to learn healthy eating options. °¨ Maintaining a healthy weight. °¨ Managing diabetes, if necessary. °¨ Reducing stress. °SEEK MEDICAL CARE IF: °· Your chest pain does not go away after treatment. °· You have a rash with blisters on your chest. °· You have a fever. °SEEK IMMEDIATE MEDICAL CARE IF:  °· Your chest pain is worse. °· You have an increasing cough, or you cough up blood. °· You have severe abdominal pain. °· You have severe weakness. °· You faint. °· You have chills. °· You have sudden, unexplained chest discomfort. °· You have sudden, unexplained discomfort in your arms, back, neck, or jaw. °· You have shortness of breath at any time. °· You suddenly start to sweat, or your skin gets clammy. °· You feel nauseous or you vomit. °· You suddenly feel light-headed or dizzy. °· Your heart begins to beat quickly, or it feels like it is skipping beats. °These symptoms may represent a serious problem that is an emergency. Do not wait to see if the symptoms will go away. Get medical help right away. Call your local emergency services (911 in the [place] Do not drive yourself to the hospital. °  °This  information is not intended to replace advice given to you by your health care provider. Make sure you discuss any questions you have with your health care provider. °  °Document Released: 10/16/2004 Document Revised: 01/27/2014 Document Reviewed: 08/12/2013 °Elsevier Interactive Patient Education ©2016 [employer] ° °

## 2015-07-07 NOTE — ED Provider Notes (Signed)
CSN: [id number]     Arrival date & time 07/07/15  1355 History   First MD Initiated Contact with Patient 07/07/15 1433     Chief Complaint  Patient presents with  . Chest Pain     Patient is a 55 y.o. male presenting with chest pain. The history is provided by the patient.  Chest Pain Associated symptoms: no abdominal pain, no back pain, no diaphoresis, no headache, no nausea, no numbness, no shortness of breath, not vomiting and no weakness   Patient presents with left-sided chest pain. States that on Sunday he had been moving some boxes and putting up a tent. States that on Tuesday he developed some left anterior chest pain. Worse with movement. Goes away with rest. Does not want with exertion. 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 No numbness or weakness. No diaphoresis. No nausea or vomiting. His father died of an MI in his 19s. Patient has had 3 negative stress test in the past, most recent was 2 years ago.  Past Medical History  Diagnosis Date  . Chest pain   . Shoulder pain   . Depression   . Erectile dysfunction   . GERD (gastroesophageal reflux disease)   . Hyperlipidemia   . HTN (hypertension)   . Insomnia   . Dysuria   . Gastroenteritis   . Insomnia   . Onychomycosis   . High cholesterol   . Gout   . Prostate cancer ([place])   . Basal cell carcinoma   . Nephrolithiasis    Past Surgical History  Procedure Laterality Date  . Prostatectomy    . Cystoscopy w/ ureteral stent placement Left 01/30/2014    Procedure: CYSTOSCOPY WITH RETROGRADE PYELOGRAM/URETEROSCOPY/URETERAL STENT PLACEMENT;  Surgeon: Bernestine Amass, MD;  Location: [facility];  Service: Urology;  Laterality: Left;  . Holmium laser application Left [id number]    Procedure: HOLMIUM LASER APPLICATION;  Surgeon: Bernestine Amass, MD;  Location: [facility];  Service: Urology;  Laterality: Left;   Family History   Problem Relation Age of Onset  . Stroke Father   . Heart attack Father    Social History  Substance Use Topics  . Smoking status: Never Smoker   . Smokeless tobacco: Never Used  . Alcohol Use: Yes     Comment: "a beer once in a while"     Review of Systems  Constitutional: Negative for diaphoresis, activity change and appetite change.  Eyes: Negative for pain.  Respiratory: Negative for chest tightness and shortness of breath.   Cardiovascular: Positive for chest pain. Negative for leg swelling.  Gastrointestinal: Negative for nausea, vomiting, abdominal pain and diarrhea.  Genitourinary: Negative for flank pain.  Musculoskeletal: Negative for back pain and neck stiffness.  Skin: Negative for rash.  Neurological: Negative for weakness, numbness and headaches.  Psychiatric/Behavioral: Negative for behavioral problems.      Allergies  Review of patient's allergies indicates no known allergies.  Home Medications   Prior to Admission medications   Medication Sig Start Date End Date Taking? Authorizing Provider  allopurinol (ZYLOPRIM) 300 MG tablet Take 300 mg by mouth daily.   Yes Historical Provider, MD  ALPRAZolam Duanne Moron) 1 MG tablet Take 0.5 mg by mouth daily as needed for anxiety.  12/07/13  Yes Historical Provider, MD  ibuprofen (ADVIL,MOTRIN) 200 MG tablet Take 400-600 mg by mouth every 6 (six) hours as  needed for mild pain.   Yes Historical Provider, MD  lisinopril (PRINIVIL,ZESTRIL) 40 MG tablet Take 1 tablet by mouth daily. 04/20/15  Yes Historical Provider, MD  omeprazole (PRILOSEC OTC) 20 MG tablet Take 20 mg by mouth daily.   Yes Historical Provider, MD  oxyCODONE-acetaminophen (PERCOCET/ROXICET) 5-325 MG tablet Take 2 tablets by mouth every 4 (four) hours as needed for severe pain. 06/26/15  Yes Hollace Kinnier [place], PA-C  simvastatin (ZOCOR) 40 MG tablet Take 40 mg by mouth every evening.   Yes Historical Provider, MD  meclizine (ANTIVERT) 25 MG tablet Take 1 tablet (25 mg  total) by mouth 3 (three) times daily as needed for dizziness. Patient not taking: Reported on 07/07/2015 12/20/14   Fredia Sorrow, MD  traMADol (ULTRAM) 50 MG tablet Take 1 tablet (50 mg total) by mouth every 6 (six) hours as needed. Patient not taking: Reported on 07/07/2015 04/06/15   Orpah Greek, MD   BP 124/77 mmHg  Pulse 66  Temp(Src) 98.2 F (36.8 C) (Oral)  Resp 18  Ht 5\' 10"  (1.778 m)  Wt 240 lb (108.863 kg)  BMI 34.44 kg/m2  SpO2 99% Physical Exam  Constitutional: He is oriented to person, place, and time. He appears well-developed and well-nourished.  HENT:  Head: Normocephalic and atraumatic.  Eyes: EOM are normal. Pupils are equal, round, and reactive to light.  Neck: Normal range of motion. Neck supple.  Cardiovascular: Normal rate, regular rhythm and normal heart sounds.   No murmur heard. Pulmonary/Chest: Effort normal and breath sounds normal. He exhibits tenderness.  Comparison left anterior chest wall musculature. No rash.  Pain worse with movements of his upper extremity.  Abdominal: Soft. Bowel sounds are normal. He exhibits no distension and no mass. There is no tenderness. There is no rebound and no guarding.  Musculoskeletal: Normal range of motion. He exhibits no edema.  Neurological: He is alert and oriented to person, place, and time. No cranial nerve deficit.  Skin: Skin is warm and dry.  Psychiatric: He has a normal mood and affect.  Nursing note and vitals reviewed.   ED Course  Procedures (including critical care time) Labs Review Labs Reviewed  BASIC METABOLIC PANEL - Abnormal; Notable for the following:    Creatinine, Ser 1.25 (*)    All other components within normal limits  CBC  TROPONIN I    Imaging Review Dg Chest 2 View  07/07/2015  CLINICAL DATA:  Chest pain. EXAM: CHEST  2 VIEW COMPARISON:  Radiograph of April 15, 2015. FINDINGS: The heart size and mediastinal contours are within normal limits. Both lungs are clear. No  pneumothorax or pleural effusion is noted. The visualized skeletal structures are unremarkable. IMPRESSION: No active cardiopulmonary disease. Electronically Signed   By: Marijo Conception, M.D.   On: 07/07/2015 15:38   I have personally reviewed and evaluated these images and lab results as part of my medical decision-making.   EKG Interpretation None      Date: 07/07/2015  Rate: 69  Rhythm: normal sinus rhythm  QRS Axis: normal  Intervals: normal  ST/T Wave abnormalities: normal  Conduction Disutrbances: none  Narrative Interpretation: unremarkable      MDM   Final diagnoses:  Chest pain, unspecified chest pain type    Patient chest pain. Likely musculoskeletal. EKG and lab work reassuring. Has had negative stress tests in the past. Will discharge home.    Davonna Belling, MD 07/07/15 2217

## 2015-07-07 NOTE — ED Notes (Addendum)
Patient c/o left side chest pain that radiates into left arm. Per patient started after moving boxes last week. Denies any shortness of breath, nausea, vomiting, or dizziness. Denies any cardiac hx. Patient states "feels like a pulled muscle."

## 2015-07-12 ENCOUNTER — Ambulatory Visit (AMBULATORY_SURGERY_CENTER): Payer: Self-pay

## 2015-07-12 VITALS — Ht 60.0 in | Wt 244.2 lb

## 2015-07-12 DIAGNOSIS — Z800 Family history of malignant neoplasm of digestive organs: Secondary | ICD-10-CM

## 2015-07-12 MED ORDER — SUPREP BOWEL PREP KIT 17.5-3.13-1.6 GM/177ML PO SOLN: 1.0000 | Freq: Once | ORAL | Status: DC

## 2015-07-12 NOTE — Progress Notes (Signed)
No home oxygen No diet meds No allergies to eggs or soy No past problems with anesthesia  Has email and internet; declined emmi

## 2015-07-18 ENCOUNTER — Encounter: Payer: Self-pay | Admitting: Gastroenterology

## 2015-07-26 ENCOUNTER — Encounter: Payer: Self-pay | Admitting: Gastroenterology

## 2015-07-26 ENCOUNTER — Emergency Department (HOSPITAL_COMMUNITY)
Admission: EM | Admit: 2015-07-26 | Discharge: 2015-07-27 | Disposition: A | Discharge status: Home / Self Care | Payer: 59 | Source: Home / Self Care | Attending: Emergency Medicine | Admitting: Emergency Medicine

## 2015-07-26 ENCOUNTER — Emergency Department (HOSPITAL_COMMUNITY): Payer: 59

## 2015-07-26 ENCOUNTER — Ambulatory Visit (AMBULATORY_SURGERY_CENTER): Payer: 59 | Admitting: Gastroenterology

## 2015-07-26 ENCOUNTER — Encounter (HOSPITAL_COMMUNITY): Payer: Self-pay | Admitting: Emergency Medicine

## 2015-07-26 VITALS — BP 120/77 | HR 65 | Temp 97.7°F | Resp 23 | Ht 60.0 in | Wt 244.0 lb

## 2015-07-26 DIAGNOSIS — I10 Essential (primary) hypertension: Secondary | ICD-10-CM | POA: Insufficient documentation

## 2015-07-26 DIAGNOSIS — K2980 Duodenitis without bleeding: Secondary | ICD-10-CM | POA: Diagnosis not present

## 2015-07-26 DIAGNOSIS — Z85828 Personal history of other malignant neoplasm of skin: Secondary | ICD-10-CM | POA: Insufficient documentation

## 2015-07-26 DIAGNOSIS — Z800 Family history of malignant neoplasm of digestive organs: Secondary | ICD-10-CM

## 2015-07-26 DIAGNOSIS — N289 Disorder of kidney and ureter, unspecified: Secondary | ICD-10-CM

## 2015-07-26 DIAGNOSIS — R0781 Pleurodynia: Secondary | ICD-10-CM

## 2015-07-26 DIAGNOSIS — Z1211 Encounter for screening for malignant neoplasm of colon: Secondary | ICD-10-CM | POA: Diagnosis present

## 2015-07-26 DIAGNOSIS — M1711 Unilateral primary osteoarthritis, right knee: Secondary | ICD-10-CM

## 2015-07-26 DIAGNOSIS — D123 Benign neoplasm of transverse colon: Secondary | ICD-10-CM

## 2015-07-26 DIAGNOSIS — E785 Hyperlipidemia, unspecified: Secondary | ICD-10-CM

## 2015-07-26 DIAGNOSIS — Z8546 Personal history of malignant neoplasm of prostate: Secondary | ICD-10-CM

## 2015-07-26 DIAGNOSIS — R1011 Right upper quadrant pain: Secondary | ICD-10-CM | POA: Diagnosis not present

## 2015-07-26 DIAGNOSIS — F329 Major depressive disorder, single episode, unspecified: Secondary | ICD-10-CM

## 2015-07-26 DIAGNOSIS — D122 Benign neoplasm of ascending colon: Secondary | ICD-10-CM

## 2015-07-26 DIAGNOSIS — R109 Unspecified abdominal pain: Secondary | ICD-10-CM

## 2015-07-26 LAB — BASIC METABOLIC PANEL
ANION GAP: 9 (ref 5–15)
BUN: 19 mg/dL (ref 6–20)
CHLORIDE: 105 mmol/L (ref 101–111)
CO2: 24 mmol/L (ref 22–32)
CREATININE: 1.3 mg/dL — AB (ref 0.61–1.24)
Calcium: 8.6 mg/dL — ABNORMAL LOW (ref 8.9–10.3)
GFR calc non Af Amer: >60 mL/min (ref >60)
Glucose, Bld: 153 mg/dL — ABNORMAL HIGH (ref 65–99)
POTASSIUM: 4.1 mmol/L (ref 3.5–5.1)
SODIUM: 138 mmol/L (ref 135–145)

## 2015-07-26 LAB — CBC
HCT: 42.1 % (ref 39.0–52.0)
HEMOGLOBIN: 14 g/dL (ref 13.0–17.0)
MCH: 30.1 pg (ref 26.0–34.0)
MCHC: 33.3 g/dL (ref 30.0–36.0)
MCV: 90.5 fL (ref 78.0–100.0)
PLATELETS: 262 10*3/uL (ref 150–400)
RBC: 4.65 MIL/uL (ref 4.22–5.81)
RDW: 12.9 % (ref 11.5–15.5)
WBC: 9.9 10*3/uL (ref 4.0–10.5)

## 2015-07-26 LAB — HEPATIC FUNCTION PANEL
ALT: 31 U/L (ref 17–63)
AST: 29 U/L (ref 15–41)
Albumin: 4.1 g/dL (ref 3.5–5.0)
Alkaline Phosphatase: 53 U/L (ref 38–126)
BILIRUBIN DIRECT: 0.1 mg/dL (ref 0.1–0.5)
BILIRUBIN INDIRECT: 0.4 mg/dL (ref 0.3–0.9)
TOTAL PROTEIN: 6.9 g/dL (ref 6.5–8.1)
Total Bilirubin: 0.5 mg/dL (ref 0.3–1.2)

## 2015-07-26 LAB — LIPASE, BLOOD: LIPASE: 27 U/L (ref 11–51)

## 2015-07-26 LAB — I-STAT TROPONIN, ED: Troponin i, poc: 0 ng/mL (ref 0.00–0.08)

## 2015-07-26 LAB — TROPONIN I

## 2015-07-26 LAB — D-DIMER, QUANTITATIVE: D-Dimer, Quant: <0.27 ug/mL-FEU (ref 0.00–0.50)

## 2015-07-26 MED ORDER — SODIUM CHLORIDE 0.9 % IV SOLN: 500.0000 mL | INTRAVENOUS | Status: DC

## 2015-07-26 MED ORDER — ONDANSETRON HCL 4 MG/2ML IJ SOLN
4.0000 mg | Freq: Once | INTRAMUSCULAR | Status: AC
  Administered 2015-07-26: 4 mg via INTRAVENOUS
  Filled 2015-07-26: qty 2

## 2015-07-26 MED ORDER — MORPHINE SULFATE (PF) 4 MG/ML IV SOLN
4.0000 mg | Freq: Once | INTRAVENOUS | Status: AC
  Administered 2015-07-26: 4 mg via INTRAVENOUS
  Filled 2015-07-26: qty 1

## 2015-07-26 NOTE — Progress Notes (Signed)
Called to room to assist during endoscopic procedure.  Patient ID and intended procedure confirmed with present staff. Received instructions for my participation in the procedure from the performing physician.  

## 2015-07-26 NOTE — Progress Notes (Signed)
Report to PACU, RN, vss, BBS= Clear.  

## 2015-07-26 NOTE — Op Note (Signed)
[place] Patient Name: David Hartman Procedure Date: 07/26/2015 9:10 AM MRN: [id number] Endoscopist: Remo Lipps P. Havery Moros , MD Age: 55 Referring MD:  Date of Birth: [birth year] Gender: Male Account #: [id number] Procedure:                Colonoscopy Indications:              Screening patient at increased risk: Family history                            of 1st-degree relative with colorectal cancer at                            age 62s Medicines:                Monitored Anesthesia Care Procedure:                Pre-Anesthesia Assessment:                           - Prior to the procedure, a History and Physical                            was performed, and patient medications and                            allergies were reviewed. The patient's tolerance of                            previous anesthesia was also reviewed. The risks                            and benefits of the procedure and the sedation                            options and risks were discussed with the patient.                            All questions were answered, and informed consent                            was obtained. Prior Anticoagulants: The patient has                            taken no previous anticoagulant or antiplatelet                            agents. ASA Grade Assessment: III - A patient with                            severe systemic disease. After reviewing the risks                            and benefits, the patient was deemed in  satisfactory condition to undergo the procedure.                           After obtaining informed consent, the colonoscope                            was passed under direct vision. Throughout the                            procedure, the patient's blood pressure, pulse, and                            oxygen saturations were monitored continuously. The                            Model CF-HQ190L [id number]) scope was  introduced                            through the anus and advanced to the the cecum,                            identified by appendiceal orifice and ileocecal                            valve. The colonoscopy was performed without                            difficulty. The patient tolerated the procedure                            well. The quality of the bowel preparation was                            adequate. The ileocecal valve, appendiceal orifice,                            and rectum were photographed. Scope In: 9:21:12 AM Scope Out: 9:37:29 AM Scope Withdrawal Time: 0 hours 14 minutes 33 seconds  Total Procedure Duration: 0 hours 16 minutes 17 seconds  Findings:                 The perianal and digital rectal examinations were                            normal.                           Two sessile polyps were found in the ascending                            colon. The polyps were 4 to 5 mm in size. These                            polyps were removed with a cold snare. Resection  and retrieval were complete.                           A 4 mm polyp was found in the transverse colon. The                            polyp was sessile. The polyp was removed with a                            cold snare. Resection and retrieval were complete.                           Non-bleeding internal hemorrhoids were found during                            retroflexion.                           The exam was otherwise without abnormality. Complications:            No immediate complications. Estimated blood loss:                            Minimal. Estimated Blood Loss:     Estimated blood loss was minimal. Impression:               - Two 4 to 5 mm polyps in the ascending colon,                            removed with a cold snare. Resected and retrieved.                           - One 4 mm polyp in the transverse colon, removed                            with a  cold snare. Resected and retrieved.                           - Non-bleeding internal hemorrhoids.                           - The examination was otherwise normal. Recommendation:           - Patient has a contact number available for                            emergencies. The signs and symptoms of potential                            delayed complications were discussed with the                            patient. Return to normal activities tomorrow.  Written discharge instructions were provided to the                            patient.                           - Resume previous diet.                           - Continue present medications.                           - No aspirin, ibuprofen, naproxen, or other                            non-steroidal anti-inflammatory drugs for 2 weeks                            after polyp removal.                           - Await pathology results.                           - Repeat colonoscopy is recommended for                            surveillance. The colonoscopy date will be                            determined after pathology results from today's                            exam become available for review. Remo Lipps P. Darl Kuss, MD 07/26/2015 9:41:02 AM This report has been signed electronically.

## 2015-07-26 NOTE — ED Notes (Signed)
Pt c/o chest pain with sob and sweating. Pt states he had colonoscopy this am and has been passing a lot of gas.

## 2015-07-26 NOTE — ED Provider Notes (Signed)
CSN: [id number]     Arrival date & time 07/26/15  2204 History  By signing my name below, I, Dora Sims, attest that this documentation has been prepared under the direction and in the presence of physician practitioner, Ezequiel Essex, MD. Electronically Signed: Dora Sims, Scribe. 07/26/2015. 10:15 PM.   Chief Complaint  Patient presents with  . Chest Pain    The history is provided by the patient. No language interpreter was used.     HPI Comments: Conard Mccrohan is a 55 y.o. male with extensive PMHx, including chest pain, GERD, prostate cancer, HLD, and HTN, who presents to the Emergency Department complaining of sudden onset, constant, severe, bilateral rib pain beginning about an hour and a half ago. Pt reports that he had a colonoscopy this morning (second colonoscopy); he had polyps removed. Pt states he did not experience rib pain before or immediately after colonoscopy. Pt reports that he ate lunch without a problem this afternoon and passed gas intermittently all afternoon. Pt reports that his rib pain presented after eating a chicken breast and a milkshake for dinner tonight. He notes associated epigastric pain, SOB, nausea, and diaphoresis as well. Pt was seen on 07/07/15 for left-sided chest pain and states his current pain is different. Pt notes rib pain exacerbation with inhalation. He notes no alleviating factors. Pt denies h/o heart attack and does not have coronary stents. He denies h/o appendectomy or cholecystectomy. Pt had emergency kidney stone removal 5 years ago and notes he is in remission for prostate cancer currently; he has h/o prostatectomy as well. He notes that he has had stress tests in the past that all came back negative. Pt does not use blood thinners. He denies back pain, testicular pain, vomiting, or any other associated symptoms.  Past Medical History  Diagnosis Date  . Chest pain   . Shoulder pain   . Depression   . Erectile dysfunction   . GERD  (gastroesophageal reflux disease)   . Hyperlipidemia   . HTN (hypertension)   . Insomnia   . Dysuria   . Gastroenteritis   . Insomnia   . Onychomycosis   . High cholesterol   . Gout   . Prostate cancer ([place])   . Basal cell carcinoma   . Nephrolithiasis   . Arthritis     right knee   Past Surgical History  Procedure Laterality Date  . Prostatectomy    . Cystoscopy w/ ureteral stent placement Left 01/30/2014    Procedure: CYSTOSCOPY WITH RETROGRADE PYELOGRAM/URETEROSCOPY/URETERAL STENT PLACEMENT;  Surgeon: Bernestine Amass, MD;  Location: [facility];  Service: Urology;  Laterality: Left;  . Holmium laser application Left [id number]    Procedure: HOLMIUM LASER APPLICATION;  Surgeon: Bernestine Amass, MD;  Location: [facility];  Service: Urology;  Laterality: Left;  . Colonoscopy     Family History  Problem Relation Age of Onset  . Stroke Father   . Heart attack Father   . Colon cancer Mother   . Esophageal cancer Neg Hx   . Pancreatic cancer Neg Hx   . Prostate cancer Neg Hx   . Rectal cancer Neg Hx   . Stomach cancer Neg Hx    Social History  Substance Use Topics  . Smoking status: Never Smoker   . Smokeless tobacco: Never Used  . Alcohol Use: 0.0 oz/week    0 Standard drinks or equivalent per week     Comment: "a beer once in a while" ; 2 times monthly  Review of Systems  A complete 10 system review of systems was obtained and all systems are negative except as noted in the HPI and PMH.   Allergies  Review of patient's allergies indicates no known allergies.  Home Medications   Prior to Admission medications   Medication Sig Start Date End Date Taking? Authorizing Provider  allopurinol (ZYLOPRIM) 300 MG tablet Take 300 mg by mouth daily.    Historical Provider, MD  ALPRAZolam Duanne Moron) 1 MG tablet Take 0.5 mg by mouth daily as needed for anxiety.  12/07/13   Historical Provider, MD  atenolol (TENORMIN) 25 MG tablet Reported on 07/26/2015 07/08/15   Historical Provider, MD   ibuprofen (ADVIL,MOTRIN) 200 MG tablet Take 400-600 mg by mouth every 6 (six) hours as needed for mild pain.    Historical Provider, MD  lisinopril (PRINIVIL,ZESTRIL) 40 MG tablet Take 1 tablet by mouth daily. 04/20/15   Historical Provider, MD  omeprazole (PRILOSEC OTC) 20 MG tablet Take 20 mg by mouth daily.    Historical Provider, MD  oxyCODONE-acetaminophen (PERCOCET/ROXICET) 5-325 MG tablet Reported on 07/26/2015 06/27/15   Historical Provider, MD  simvastatin (ZOCOR) 40 MG tablet Take 40 mg by mouth every evening.    Historical Provider, MD   BP 180/102 mmHg  Pulse 82  Temp(Src) 97.8 F (36.6 C)  Resp 20  Ht 5\' 10"  (1.778 m)  Wt 242 lb (109.77 kg)  BMI 34.72 kg/m2  SpO2 100% Physical Exam  Constitutional: He is oriented to person, place, and time. He appears well-developed and well-nourished. No distress.  Uncomfortable. Diaphoretic.  HENT:  Head: Normocephalic and atraumatic.  Mouth/Throat: Oropharynx is clear and moist. No oropharyngeal exudate.  Eyes: Conjunctivae and EOM are normal. Pupils are equal, round, and reactive to light.  Neck: Normal range of motion. Neck supple.  No meningismus.  Cardiovascular: Normal rate, regular rhythm, normal heart sounds and intact distal pulses.   No murmur heard. Pulmonary/Chest: Effort normal and breath sounds normal. Tachypnea noted. No respiratory distress.  Lungs are clear bilaterally Bilateral rib pain  Abdominal: Soft. He exhibits distension. There is tenderness. There is no rebound and no guarding.  Abdomen appears distended. Diffuse abdominal tenderness. No rebound or guarding.  Musculoskeletal: Normal range of motion. He exhibits no edema or tenderness.  Neurological: He is alert and oriented to person, place, and time. No cranial nerve deficit. He exhibits normal muscle tone. Coordination normal.  No ataxia on finger to nose bilaterally. No pronator drift. 5/5 strength throughout. CN 2-12 intact.Equal grip strength. Sensation  intact.   Skin: Skin is warm.  Psychiatric: He has a normal mood and affect. His behavior is normal.  Nursing note and vitals reviewed.   ED Course  Procedures (including critical care time)  DIAGNOSTIC STUDIES: Oxygen Saturation is 97% on RA, normal by my interpretation.    COORDINATION OF CARE: 10:15 PM Discussed treatment plan with pt at bedside and pt agreed to plan.  Labs Review Labs Reviewed  BASIC METABOLIC PANEL - Abnormal; Notable for the following:    Glucose, Bld 153 (*)    Creatinine, Ser 1.30 (*)    Calcium 8.6 (*)    All other components within normal limits  CBC  LIPASE, BLOOD  TROPONIN I  HEPATIC FUNCTION PANEL  D-DIMER, QUANTITATIVE (NOT AT [facility])  URINALYSIS, ROUTINE W REFLEX MICROSCOPIC (NOT AT [facility])  TROPONIN I  I-STAT TROPOININ, ED    Imaging Review Dg Abd Acute W/chest  07/26/2015  CLINICAL DATA:  Severe upper abdominal pain  in chest pain beginning 2 hours ago. Colonoscopy due date. EXAM: DG ABDOMEN ACUTE W/ 1V CHEST COMPARISON:  07/07/2015 FINDINGS: No evidence of ileus, obstruction or free air. The patient appears to have fecal matter in the colon, unusual given the history of colonoscopy due day. No abnormal calcifications are bony findings. One view chest shows normal heart and mediastinal shadows. The lungs are clear. No free air seen under the diaphragm. IMPRESSION: Negative abdominal radiographs.  No acute cardiopulmonary disease. Electronically Signed   By: Nelson Chimes M.D.   On: 07/26/2015 23:38   I have personally reviewed and evaluated these images and lab results as part of my medical decision-making.   EKG Interpretation   Date/Time:  Thursday July 26 2015 22:11:46 EDT Ventricular Rate:  82 PR Interval:    QRS Duration: 84 QT Interval:  353 QTC Calculation: 413 R Axis:   76 Text Interpretation:  Sinus rhythm Prolonged PR interval No significant  change was found Confirmed by Wyvonnia Dusky  MD, [place] [phone]) on 07/26/2015  10:57:14 PM       MDM   Final diagnoses:  None  Patient with acute onset of bilateral rib pain and upper abdominal pain, diaphoresis and shortness of breath that occurred after eating dinner today. Notably had a colonoscopy this morning but was able to tolerate lunch without a problem. This chest pain is dissimilar to the chest pain he was evaluated for 3 weeks ago.   EKG with no acute ST changes. No free air on plain films.  Labs reassuring.  D-dimer negative. LFTs and lipase normal.  Patient feeling improved but still uncomfortable.  Unclear etiology of pain, could be GERD related. Will proceed with CT imaging to evaluate for complication from today's colonoscopy.  Dr Roxanne Mins to assume care.  Second troponin to be drawn at 2am.    I personally performed the services described in this documentation, which was scribed in my presence. The recorded information has been reviewed and is accurate.   Ezequiel Essex, MD 07/27/15 [phone]

## 2015-07-26 NOTE — ED Notes (Signed)
MD at bedside. 

## 2015-07-26 NOTE — Patient Instructions (Signed)
YOU HAD AN ENDOSCOPIC PROCEDURE TODAY AT [facility]:   Refer to the procedure report that was given to you for any specific questions about what was found during the examination.  If the procedure report does not answer your questions, please call your gastroenterologist to clarify.  If you requested that your care partner not be given the details of your procedure findings, then the procedure report has been included in a sealed envelope for you to review at your convenience later.  YOU SHOULD EXPECT: Some feelings of bloating in the abdomen. Passage of more gas than usual.  Walking can help get rid of the air that was put into your GI tract during the procedure and reduce the bloating. If you had a lower endoscopy (such as a colonoscopy or flexible sigmoidoscopy) you may notice spotting of blood in your stool or on the toilet paper. If you underwent a bowel prep for your procedure, you may not have a normal bowel movement for a few days.  Please Note:  You might notice some irritation and congestion in your nose or some drainage.  This is from the oxygen used during your procedure.  There is no need for concern and it should clear up in a day or so.  SYMPTOMS TO REPORT IMMEDIATELY:   Following lower endoscopy (colonoscopy or flexible sigmoidoscopy):  Excessive amounts of blood in the stool  Significant tenderness or worsening of abdominal pains  Swelling of the abdomen that is new, acute  Fever of 100F or higher  For urgent or emergent issues, a gastroenterologist can be reached at any hour by calling [phone].  DIET: Your first meal following the procedure should be a small meal and then it is ok to progress to your normal diet. Heavy or fried foods are harder to digest and may make you feel nauseous or bloated.  Likewise, meals heavy in dairy and vegetables can increase bloating.  Drink plenty of fluids but you should avoid alcoholic beverages for 24 hours.  ACTIVITY:   You should plan to take it easy for the rest of today and you should NOT DRIVE or use heavy machinery until tomorrow (because of the sedation medicines used during the test).    FOLLOW UP: Our staff will call the number listed on your records the next business day following your procedure to check on you and address any questions or concerns that you may have regarding the information given to you following your procedure. If we do not reach you, we will leave a message.  However, if you are feeling well and you are not experiencing any problems, there is no need to return our call.  We will assume that you have returned to your regular daily activities without incident.  If any biopsies were taken you will be contacted by phone or by letter within the next 1-3 weeks.  Please call us at [phone] if you have not heard about the biopsies in 3 weeks.   SIGNATURES/CONFIDENTIALITY: You and/or your care partner have signed paperwork which will be entered into your electronic medical record.  These signatures attest to the fact that that the information above on your After Visit Summary has been reviewed and is understood.  Full responsibility of the confidentiality of this discharge information lies with you and/or your care-partner.  NO ASPIRIN, ASPIRIN CONTAINING PRODUCTS (GOODY OR BC POWDERS), OR NSAIDS (IBUPROFEN, ADVIL, ALEVE, OR MOTRIN) FOR 2 WEEKS, TYLENOL IS OK  CONTINUE OTHER MEDICATIONS  Please read over handouts about polyps, hemorrhoids and high fiber diets

## 2015-07-27 ENCOUNTER — Encounter: Payer: Self-pay | Admitting: Gastroenterology

## 2015-07-27 ENCOUNTER — Emergency Department (HOSPITAL_COMMUNITY): Payer: 59

## 2015-07-27 ENCOUNTER — Inpatient Hospital Stay (HOSPITAL_COMMUNITY)
Admission: EM | Admit: 2015-07-27 | Discharge: 2015-07-30 | DRG: 392 | Disposition: A | Discharge status: Home / Self Care | Payer: 59 | Attending: Family Medicine | Admitting: Family Medicine

## 2015-07-27 ENCOUNTER — Telehealth: Payer: Self-pay

## 2015-07-27 ENCOUNTER — Encounter (HOSPITAL_COMMUNITY): Payer: Self-pay | Admitting: Emergency Medicine

## 2015-07-27 ENCOUNTER — Ambulatory Visit (INDEPENDENT_AMBULATORY_CARE_PROVIDER_SITE_OTHER)
Admission: RE | Admit: 2015-07-27 | Discharge: 2015-07-27 | Disposition: A | Discharge status: Home / Self Care | Payer: 59 | Source: Ambulatory Visit | Attending: Gastroenterology | Admitting: Gastroenterology

## 2015-07-27 ENCOUNTER — Other Ambulatory Visit (INDEPENDENT_AMBULATORY_CARE_PROVIDER_SITE_OTHER): Payer: 59

## 2015-07-27 ENCOUNTER — Ambulatory Visit (INDEPENDENT_AMBULATORY_CARE_PROVIDER_SITE_OTHER): Payer: 59 | Admitting: Gastroenterology

## 2015-07-27 VITALS — BP 110/60 | HR 113 | Temp 100.0°F | Ht 70.0 in | Wt 243.0 lb

## 2015-07-27 DIAGNOSIS — R1084 Generalized abdominal pain: Secondary | ICD-10-CM | POA: Insufficient documentation

## 2015-07-27 DIAGNOSIS — R109 Unspecified abdominal pain: Secondary | ICD-10-CM | POA: Diagnosis present

## 2015-07-27 DIAGNOSIS — F419 Anxiety disorder, unspecified: Secondary | ICD-10-CM | POA: Diagnosis not present

## 2015-07-27 DIAGNOSIS — K2980 Duodenitis without bleeding: Secondary | ICD-10-CM | POA: Diagnosis not present

## 2015-07-27 DIAGNOSIS — Z8546 Personal history of malignant neoplasm of prostate: Secondary | ICD-10-CM

## 2015-07-27 DIAGNOSIS — E785 Hyperlipidemia, unspecified: Secondary | ICD-10-CM | POA: Diagnosis present

## 2015-07-27 DIAGNOSIS — R1011 Right upper quadrant pain: Secondary | ICD-10-CM

## 2015-07-27 DIAGNOSIS — Z9079 Acquired absence of other genital organ(s): Secondary | ICD-10-CM

## 2015-07-27 DIAGNOSIS — I10 Essential (primary) hypertension: Secondary | ICD-10-CM | POA: Diagnosis present

## 2015-07-27 DIAGNOSIS — C61 Malignant neoplasm of prostate: Secondary | ICD-10-CM | POA: Diagnosis present

## 2015-07-27 DIAGNOSIS — K219 Gastro-esophageal reflux disease without esophagitis: Secondary | ICD-10-CM | POA: Diagnosis present

## 2015-07-27 DIAGNOSIS — Z85828 Personal history of other malignant neoplasm of skin: Secondary | ICD-10-CM

## 2015-07-27 DIAGNOSIS — D72829 Elevated white blood cell count, unspecified: Secondary | ICD-10-CM | POA: Diagnosis present

## 2015-07-27 LAB — URINALYSIS, ROUTINE W REFLEX MICROSCOPIC
BILIRUBIN URINE: NEGATIVE
BILIRUBIN URINE: NEGATIVE
Glucose, UA: NEGATIVE mg/dL
Glucose, UA: NEGATIVE mg/dL
KETONES UR: NEGATIVE mg/dL
KETONES UR: NEGATIVE mg/dL
Leukocytes, UA: NEGATIVE
Leukocytes, UA: NEGATIVE
Nitrite: NEGATIVE
Nitrite: NEGATIVE
PH: 7 (ref 5.0–8.0)
PH: 6 (ref 5.0–8.0)
Protein, ur: NEGATIVE mg/dL
Protein, ur: NEGATIVE mg/dL
SPECIFIC GRAVITY, URINE: 1.02 (ref 1.005–1.030)

## 2015-07-27 LAB — CBC WITH DIFFERENTIAL/PLATELET
BASOS PCT: 0.1 % (ref 0.0–3.0)
BASOS PCT: 0 %
Basophils Absolute: 0 10*3/uL (ref 0.0–0.1)
Basophils Absolute: 0 10*3/uL (ref 0.0–0.1)
EOS ABS: 0 10*3/uL (ref 0.0–0.7)
EOS PCT: 0 % (ref 0.0–5.0)
Eosinophils Absolute: 0 10*3/uL (ref 0.0–0.7)
Eosinophils Relative: 0 %
HCT: 43.3 % (ref 39.0–52.0)
HEMATOCRIT: 44.1 % (ref 39.0–52.0)
HEMOGLOBIN: 14.8 g/dL (ref 13.0–17.0)
HEMOGLOBIN: 15.3 g/dL (ref 13.0–17.0)
LYMPHS ABS: 1.1 10*3/uL (ref 0.7–4.0)
Lymphocytes Relative: 6 %
Lymphs Abs: 1.2 10*3/uL (ref 0.7–4.0)
MCH: 30.5 pg (ref 26.0–34.0)
MCHC: 33.5 g/dL (ref 30.0–36.0)
MCHC: 35.3 g/dL (ref 30.0–36.0)
MCV: 88.8 fl (ref 78.0–100.0)
MCV: 86.4 fL (ref 78.0–100.0)
MONO ABS: 2 10*3/uL — AB (ref 0.1–1.0)
MONOS PCT: 8 % (ref 3.0–12.0)
MONOS PCT: 11 %
Monocytes Absolute: 1.8 10*3/uL — ABNORMAL HIGH (ref 0.1–1.0)
NEUTROS PCT: 83 %
Neutro Abs: 20 10*3/uL — ABNORMAL HIGH (ref 1.4–7.7)
Neutro Abs: 15.9 10*3/uL — ABNORMAL HIGH (ref 1.7–7.7)
Platelets: 308 10*3/uL (ref 150.0–400.0)
Platelets: 266 10*3/uL (ref 150–400)
RBC: 4.97 Mil/uL (ref 4.22–5.81)
RBC: 5.01 MIL/uL (ref 4.22–5.81)
RDW: 13.4 % (ref 11.5–15.5)
RDW: 12.9 % (ref 11.5–15.5)
WBC: 23 10*3/uL (ref 4.0–10.5)
WBC: 19 10*3/uL — ABNORMAL HIGH (ref 4.0–10.5)

## 2015-07-27 LAB — COMPREHENSIVE METABOLIC PANEL
ALBUMIN: 4.5 g/dL (ref 3.5–5.0)
ALK PHOS: 54 U/L (ref 38–126)
ALT: 30 U/L (ref 17–63)
AST: 27 U/L (ref 15–41)
Anion gap: 9 (ref 5–15)
BILIRUBIN TOTAL: 1 mg/dL (ref 0.3–1.2)
BUN: 17 mg/dL (ref 6–20)
CALCIUM: 9.2 mg/dL (ref 8.9–10.3)
CO2: 23 mmol/L (ref 22–32)
CREATININE: 1.1 mg/dL (ref 0.61–1.24)
Chloride: 104 mmol/L (ref 101–111)
GFR calc Af Amer: >60 mL/min (ref >60)
GLUCOSE: 108 mg/dL — AB (ref 65–99)
POTASSIUM: 3.9 mmol/L (ref 3.5–5.1)
Sodium: 136 mmol/L (ref 135–145)
TOTAL PROTEIN: 7.5 g/dL (ref 6.5–8.1)

## 2015-07-27 LAB — URINE MICROSCOPIC-ADD ON
Bacteria, UA: NONE SEEN
SQUAMOUS EPITHELIAL / LPF: NONE SEEN
WBC, UA: NONE SEEN WBC/hpf (ref 0–5)

## 2015-07-27 LAB — I-STAT CG4 LACTIC ACID, ED: LACTIC ACID, VENOUS: 1.78 mmol/L (ref 0.5–1.9)

## 2015-07-27 LAB — TROPONIN I: Troponin I: <0.03 ng/mL (ref <0.03)

## 2015-07-27 LAB — LIPASE, BLOOD: Lipase: 23 U/L (ref 11–51)

## 2015-07-27 LAB — PROCALCITONIN: Procalcitonin: 0.16 ng/mL

## 2015-07-27 MED ORDER — LORAZEPAM 2 MG/ML IJ SOLN: 1.0000 mg | Freq: Four times a day (QID) | INTRAMUSCULAR | Status: DC | PRN

## 2015-07-27 MED ORDER — MORPHINE SULFATE (PF) 4 MG/ML IV SOLN: 4.0000 mg | Freq: Once | INTRAVENOUS | Status: DC

## 2015-07-27 MED ORDER — PIPERACILLIN-TAZOBACTAM 3.375 G IVPB
3.3750 g | Freq: Three times a day (TID) | INTRAVENOUS | Status: DC
  Administered 2015-07-28 – 2015-07-30 (×7): 3.375 g via INTRAVENOUS
  Filled 2015-07-27 (×7): qty 50

## 2015-07-27 MED ORDER — DIATRIZOATE MEGLUMINE & SODIUM 66-10 % PO SOLN: 15.0000 mL | Freq: Once | ORAL | Status: DC

## 2015-07-27 MED ORDER — HYDROMORPHONE HCL 1 MG/ML IJ SOLN
1.0000 mg | Freq: Once | INTRAMUSCULAR | Status: AC
  Administered 2015-07-27: 1 mg via INTRAVENOUS
  Filled 2015-07-27: qty 1

## 2015-07-27 MED ORDER — IOPAMIDOL (ISOVUE-370) INJECTION 76%
100.0000 mL | Freq: Once | INTRAVENOUS | Status: AC | PRN
  Administered 2015-07-27: 100 mL via INTRAVENOUS

## 2015-07-27 MED ORDER — HYDROMORPHONE HCL 1 MG/ML IJ SOLN
1.0000 mg | INTRAMUSCULAR | Status: DC | PRN
  Administered 2015-07-28 – 2015-07-30 (×10): 1 mg via INTRAVENOUS
  Filled 2015-07-27 (×10): qty 1

## 2015-07-27 MED ORDER — SODIUM CHLORIDE 0.9 % IV BOLUS (SEPSIS)
1000.0000 mL | Freq: Once | INTRAVENOUS | Status: AC
  Administered 2015-07-27: 1000 mL via INTRAVENOUS

## 2015-07-27 MED ORDER — GI COCKTAIL ~~LOC~~
30.0000 mL | Freq: Once | ORAL | Status: AC
  Administered 2015-07-27: 30 mL via ORAL
  Filled 2015-07-27: qty 30

## 2015-07-27 MED ORDER — DIATRIZOATE MEGLUMINE & SODIUM 66-10 % PO SOLN
ORAL | Status: AC
  Filled 2015-07-27: qty 30

## 2015-07-27 MED ORDER — ONDANSETRON HCL 4 MG/2ML IJ SOLN: 4.0000 mg | Freq: Four times a day (QID) | INTRAMUSCULAR | Status: DC | PRN

## 2015-07-27 MED ORDER — ONDANSETRON HCL 4 MG/2ML IJ SOLN
4.0000 mg | Freq: Once | INTRAMUSCULAR | Status: AC
  Administered 2015-07-27: 4 mg via INTRAVENOUS
  Filled 2015-07-27: qty 2

## 2015-07-27 MED ORDER — IOPAMIDOL (ISOVUE-300) INJECTION 61%: 100.0000 mL | Freq: Once | INTRAVENOUS | Status: DC | PRN

## 2015-07-27 MED ORDER — PIPERACILLIN-TAZOBACTAM 3.375 G IVPB 30 MIN
3.3750 g | Freq: Once | INTRAVENOUS | Status: AC
  Administered 2015-07-27: 3.375 g via INTRAVENOUS
  Filled 2015-07-27: qty 50

## 2015-07-27 MED ORDER — KETOROLAC TROMETHAMINE 30 MG/ML IJ SOLN
30.0000 mg | Freq: Once | INTRAMUSCULAR | Status: AC
  Administered 2015-07-27: 30 mg via INTRAVENOUS
  Filled 2015-07-27: qty 1

## 2015-07-27 MED ORDER — OXYCODONE-ACETAMINOPHEN 5-325 MG PO TABS
1.0000 | ORAL_TABLET | ORAL | Status: DC | PRN
Start: 2015-07-27 — End: 2015-07-27

## 2015-07-27 MED ORDER — PANTOPRAZOLE SODIUM 40 MG IV SOLR
40.0000 mg | INTRAVENOUS | Status: DC
  Administered 2015-07-27: 40 mg via INTRAVENOUS
  Filled 2015-07-27: qty 40

## 2015-07-27 MED ORDER — ENOXAPARIN SODIUM 40 MG/0.4ML ~~LOC~~ SOLN
40.0000 mg | SUBCUTANEOUS | Status: DC
  Administered 2015-07-28 – 2015-07-30 (×3): 40 mg via SUBCUTANEOUS
  Filled 2015-07-27 (×3): qty 0.4

## 2015-07-27 MED ORDER — SODIUM CHLORIDE 0.9 % IV SOLN
INTRAVENOUS | Status: AC
  Administered 2015-07-27 – 2015-07-28 (×2): via INTRAVENOUS

## 2015-07-27 MED ORDER — HYDRALAZINE HCL 20 MG/ML IJ SOLN: 10.0000 mg | INTRAMUSCULAR | Status: DC | PRN

## 2015-07-27 MED ORDER — ONDANSETRON HCL 4 MG PO TABS
4.0000 mg | ORAL_TABLET | Freq: Four times a day (QID) | ORAL | Status: DC | PRN
Start: 2015-07-27 — End: 2015-07-30

## 2015-07-27 MED ORDER — MORPHINE SULFATE (PF) 4 MG/ML IV SOLN
4.0000 mg | Freq: Once | INTRAVENOUS | Status: DC
  Filled 2015-07-27: qty 1

## 2015-07-27 NOTE — ED Provider Notes (Signed)
Patient initially seen and evaluated by Dr. Wyvonnia Dusky, evaluated for abdominal pain following colonoscopy. Initial workup was negative and he was sent for CT of abdomen and pelvis which I was asked to evaluate. CT has come back unremarkable. There is a cystic structure in the left lower quadrant which is stable. No evidence of free air and no inflammatory changes. Laboratory workup is completely normal except for mild renal insufficiency which is asked improved from last time it was checked. He is sent home with a take home pack of oxycodone-acetaminophen, advised to be reevaluated if pain persists beyond another 24 hours.  Results for orders placed or performed during the hospital encounter of [id number]  Basic metabolic panel  Result Value Ref Range   Sodium 138 135 - 145 mmol/L   Potassium 4.1 3.5 - 5.1 mmol/L   Chloride 105 101 - 111 mmol/L   CO2 24 22 - 32 mmol/L   Glucose, Bld 153 (H) 65 - 99 mg/dL   BUN 19 6 - 20 mg/dL   Creatinine, Ser 1.30 (H) 0.61 - 1.24 mg/dL   Calcium 8.6 (L) 8.9 - 10.3 mg/dL   GFR calc non Af Amer >60 >60 mL/min   GFR calc Af Amer >60 >60 mL/min   Anion gap 9 5 - 15  CBC  Result Value Ref Range   WBC 9.9 4.0 - 10.5 K/uL   RBC 4.65 4.22 - 5.81 MIL/uL   Hemoglobin 14.0 13.0 - 17.0 g/dL   HCT 42.1 39.0 - 52.0 %   MCV 90.5 78.0 - 100.0 fL   MCH 30.1 26.0 - 34.0 pg   MCHC 33.3 30.0 - 36.0 g/dL   RDW 12.9 11.5 - 15.5 %   Platelets 262 150 - 400 K/uL  Lipase, blood  Result Value Ref Range   Lipase 27 11 - 51 U/L  Troponin I  Result Value Ref Range   Troponin I <0.03 <0.03 ng/mL  Urinalysis, Routine w reflex microscopic (not at [facility])  Result Value Ref Range   Color, Urine YELLOW YELLOW   APPearance CLEAR CLEAR   Specific Gravity, Urine 1.020 1.005 - 1.030   pH 6.0 5.0 - 8.0   Glucose, UA NEGATIVE NEGATIVE mg/dL   Hgb urine dipstick MODERATE (A) NEGATIVE   Bilirubin Urine NEGATIVE NEGATIVE   Ketones, ur NEGATIVE NEGATIVE mg/dL   Protein, ur NEGATIVE  NEGATIVE mg/dL   Nitrite NEGATIVE NEGATIVE   Leukocytes, UA NEGATIVE NEGATIVE  Hepatic function panel  Result Value Ref Range   Total Protein 6.9 6.5 - 8.1 g/dL   Albumin 4.1 3.5 - 5.0 g/dL   AST 29 15 - 41 U/L   ALT 31 17 - 63 U/L   Alkaline Phosphatase 53 38 - 126 U/L   Total Bilirubin 0.5 0.3 - 1.2 mg/dL   Bilirubin, Direct 0.1 0.1 - 0.5 mg/dL   Indirect Bilirubin 0.4 0.3 - 0.9 mg/dL  D-dimer, quantitative (not at [facility])  Result Value Ref Range   D-Dimer, Quant <0.27 0.00 - 0.50 ug/mL-FEU  Troponin I  Result Value Ref Range   Troponin I <0.03 <0.03 ng/mL  Urine microscopic-add on  Result Value Ref Range   Squamous Epithelial / LPF 0-5 (A) NONE SEEN   WBC, UA 0-5 0 - 5 WBC/hpf   RBC / HPF 6-30 0 - 5 RBC/hpf   Bacteria, UA FEW (A) NONE SEEN  I-stat troponin, ED  Result Value Ref Range   Troponin i, poc 0.00 0.00 - 0.08 ng/mL   Comment  3           Dg Chest 2 View  07/07/2015  CLINICAL DATA:  Chest pain. EXAM: CHEST  2 VIEW COMPARISON:  Radiograph of April 15, 2015. FINDINGS: The heart size and mediastinal contours are within normal limits. Both lungs are clear. No pneumothorax or pleural effusion is noted. The visualized skeletal structures are unremarkable. IMPRESSION: No active cardiopulmonary disease. Electronically Signed   By: Marijo Conception, M.D.   On: 07/07/2015 15:38   Ct Angio Chest Pe W/cm &/or Wo Cm  07/27/2015  CLINICAL DATA:  Acute onset of severe bilateral rib pain. Recent colonoscopy with polyp removal. Shortness of breath, nausea and diaphoresis. Initial encounter. EXAM: CT ANGIOGRAPHY CHEST CT ABDOMEN AND PELVIS WITH CONTRAST TECHNIQUE: Multidetector CT imaging of the chest was performed using the standard protocol during bolus administration of intravenous contrast. Multiplanar CT image reconstructions and MIPs were obtained to evaluate the vascular anatomy. Multidetector CT imaging of the abdomen and pelvis was performed using the standard protocol during bolus  administration of intravenous contrast. CONTRAST:  100 mL of Isovue 370 IV contrast COMPARISON:  CT of the abdomen and pelvis from 01/29/2014, and chest radiograph performed 07/26/2015 FINDINGS: CTA CHEST FINDINGS There is no evidence of pulmonary embolus. Minimal bilateral atelectasis is noted. The lungs are otherwise clear. There is no evidence of significant focal consolidation, pleural effusion or pneumothorax. No masses are identified; no abnormal focal contrast enhancement is seen. Mild coronary artery calcification is noted. No mediastinal lymphadenopathy is seen. No pericardial effusion is identified. The great vessels are grossly unremarkable in appearance. No axillary lymphadenopathy is seen. The visualized portions of the thyroid gland are unremarkable in appearance. No acute osseous abnormalities are seen. CT ABDOMEN and PELVIS FINDINGS The liver and spleen are unremarkable in appearance. The gallbladder is within normal limits. The pancreas and adrenal glands are unremarkable. Scattered small bilateral renal cysts are seen. There is no evidence of hydronephrosis. No renal or ureteral stones are seen. No significant perinephric stranding is appreciated. No free fluid is identified. The small bowel is unremarkable in appearance. The stomach is within normal limits. No acute vascular abnormalities are seen. The appendix is normal in caliber, without evidence of appendicitis. The colon is unremarkable in appearance. The bladder is mildly distended and grossly unremarkable. A nonspecific 7.7 cm cystic structure with peripheral calcification is noted at the left upper hemipelvis. This is stable from 2016 and likely benign. The patient is status post prostatectomy. No inguinal lymphadenopathy is seen. No acute osseous abnormalities are identified. Review of the MIP images confirms the above findings. IMPRESSION: 1. No evidence of pulmonary embolus. 2. Minimal bibasilar atelectasis noted.  Lungs otherwise  clear. 3. Mild coronary artery calcifications seen. 4. Scattered small bilateral renal cysts seen. 5. Stable nonspecific 7.7 cm cystic structure with peripheral calcification at the left upper hemipelvis is likely benign. Electronically Signed   By: Garald Balding M.D.   On: 07/27/2015 01:58   Ct Abdomen Pelvis W Contrast  07/27/2015  CLINICAL DATA:  Acute onset of severe bilateral rib pain. Recent colonoscopy with polyp removal. Shortness of breath, nausea and diaphoresis. Initial encounter. EXAM: CT ANGIOGRAPHY CHEST CT ABDOMEN AND PELVIS WITH CONTRAST TECHNIQUE: Multidetector CT imaging of the chest was performed using the standard protocol during bolus administration of intravenous contrast. Multiplanar CT image reconstructions and MIPs were obtained to evaluate the vascular anatomy. Multidetector CT imaging of the abdomen and pelvis was performed using the standard protocol during bolus administration  of intravenous contrast. CONTRAST:  100 mL of Isovue 370 IV contrast COMPARISON:  CT of the abdomen and pelvis from 01/29/2014, and chest radiograph performed 07/26/2015 FINDINGS: CTA CHEST FINDINGS There is no evidence of pulmonary embolus. Minimal bilateral atelectasis is noted. The lungs are otherwise clear. There is no evidence of significant focal consolidation, pleural effusion or pneumothorax. No masses are identified; no abnormal focal contrast enhancement is seen. Mild coronary artery calcification is noted. No mediastinal lymphadenopathy is seen. No pericardial effusion is identified. The great vessels are grossly unremarkable in appearance. No axillary lymphadenopathy is seen. The visualized portions of the thyroid gland are unremarkable in appearance. No acute osseous abnormalities are seen. CT ABDOMEN and PELVIS FINDINGS The liver and spleen are unremarkable in appearance. The gallbladder is within normal limits. The pancreas and adrenal glands are unremarkable. Scattered small bilateral renal cysts  are seen. There is no evidence of hydronephrosis. No renal or ureteral stones are seen. No significant perinephric stranding is appreciated. No free fluid is identified. The small bowel is unremarkable in appearance. The stomach is within normal limits. No acute vascular abnormalities are seen. The appendix is normal in caliber, without evidence of appendicitis. The colon is unremarkable in appearance. The bladder is mildly distended and grossly unremarkable. A nonspecific 7.7 cm cystic structure with peripheral calcification is noted at the left upper hemipelvis. This is stable from 2016 and likely benign. The patient is status post prostatectomy. No inguinal lymphadenopathy is seen. No acute osseous abnormalities are identified. Review of the MIP images confirms the above findings. IMPRESSION: 1. No evidence of pulmonary embolus. 2. Minimal bibasilar atelectasis noted.  Lungs otherwise clear. 3. Mild coronary artery calcifications seen. 4. Scattered small bilateral renal cysts seen. 5. Stable nonspecific 7.7 cm cystic structure with peripheral calcification at the left upper hemipelvis is likely benign. Electronically Signed   By: Garald Balding M.D.   On: 07/27/2015 01:58   Dg Abd Acute W/chest  07/26/2015  CLINICAL DATA:  Severe upper abdominal pain in chest pain beginning 2 hours ago. Colonoscopy due date. EXAM: DG ABDOMEN ACUTE W/ 1V CHEST COMPARISON:  07/07/2015 FINDINGS: No evidence of ileus, obstruction or free air. The patient appears to have fecal matter in the colon, unusual given the history of colonoscopy due day. No abnormal calcifications are bony findings. One view chest shows normal heart and mediastinal shadows. The lungs are clear. No free air seen under the diaphragm. IMPRESSION: Negative abdominal radiographs.  No acute cardiopulmonary disease. Electronically Signed   By: Nelson Chimes M.D.   On: 07/26/2015 23:38    Images viewed by me.   Delora Fuel, MD [id number]

## 2015-07-27 NOTE — ED Notes (Signed)
Bed: WA20 Expected date:  Expected time:  Means of arrival:  Comments: EMS- 55yo M, lower abdominal pain/colonoscopy yesterday

## 2015-07-27 NOTE — ED Provider Notes (Signed)
Case discussed with patient's gastroenterologist who recommends admission for observation. Will consult to [facility] hospitalist  Lacretia Leigh, MD 07/27/15 [phone]

## 2015-07-27 NOTE — ED Notes (Signed)
US at bedside

## 2015-07-27 NOTE — Telephone Encounter (Signed)
Patient seen in the office today for this complaint

## 2015-07-27 NOTE — Progress Notes (Signed)
GI Update:  I reviewed his imaging with CT and [place]. The etiology of this presentation remains unclear. CT shows no evidence of perforation, splenic or liver injury. GB thickening was noted but LFTs normal and follow up US shows a normal GB. Nonspecific duodenitis noted. He is on empiric antibiotics given leukocytosis and prior tachycardia, pain better controlled with IV pain meds now and he is much more comfortable, HR down to 90s. Unclear if this presentation is related to his recent colonoscopy or not.   I recommend admission for observation overnight, continue empiric antibiotics, and keep him NPO. Given duodenitis we can give him protonix 40mg . I have notified our weekend GI inpatient service who will see him in the morning, hopefully he continues to improve with supportive care.   Platinum Cellar, MD

## 2015-07-27 NOTE — Progress Notes (Signed)
Pharmacy Antibiotic Note  David Hartman is a 55 y.o. male admitted on 07/27/2015 with possible intra-abdominal infection (doudenitis).  Pharmacy has been consulted for piperacillin/tazobactam dosing.  Plan:  Zosyn 3.375gm IV q8h over 4h infusion  Unlikely to need dose adjustment, pharmacy will sign-off   Temp (24hrs), Avg:98.7 F (37.1 C), Min:97.8 F (36.6 C), Max:100 F (37.8 C)   Recent Labs Lab 07/26/15 2249 07/27/15 1406 07/27/15 1611 07/27/15 1620  WBC 9.9 23.0 Repeated and verified X2.* 19.0*  --   CREATININE 1.30*  --  1.10  --   LATICACIDVEN  --   --   --  1.78    Estimated Creatinine Clearance: 95.4 mL/min (by C-G formula based on Cr of 1.1).    No Known Allergies  Antimicrobials this admission: 7/7 >> zosyn >>  Dose adjustments this admission:  Microbiology results: none  Thank you for allowing pharmacy to be a part of this patient's care.  Doreene Eland, PharmD, BCPS.   Pager: [phone] 07/27/2015 9:11 PM

## 2015-07-27 NOTE — Patient Instructions (Signed)
Your physician has requested that you go to the basement for the following lab work before leaving today:  CBC  Please go downstairs for an abdominal x ray as well.

## 2015-07-27 NOTE — ED Provider Notes (Signed)
CSN: [id number]     Arrival date & time 07/27/15  1527 History   First MD Initiated Contact with Patient 07/27/15 1534     Chief Complaint  Patient presents with  . Abdominal Pain     (Consider location/radiation/quality/duration/timing/severity/associated sxs/prior Treatment) Patient is a 55 y.o. male presenting with abdominal pain. The history is provided by the patient.  Abdominal Pain Pain location:  L flank and R flank Pain quality: sharp and shooting   Pain radiates to:  Does not radiate Pain severity:  Severe Onset quality:  Sudden Duration:  2 days Timing:  Constant Progression:  Worsening Chronicity:  New Relieved by:  Nothing Worsened by:  Nothing tried Ineffective treatments:  None tried Associated symptoms: constipation, nausea and vomiting   Associated symptoms: no chest pain, no chills, no diarrhea, no fever and no shortness of breath    55 yo M With a chief complaint of diffuse abdominal pain. The started after he had a colonoscopy done a few days ago. He was seen in the emergency department yesterday with a negative workup. This included a CT angiogram of the chest and a CT abdomen and pelvis with contrast. He has had continued pain. He denies any flatus. Feels that the pain is sharp is on bilateral flanks and comes and goes. Has had 2 episodes of vomiting. Denies fevers or chills. He felt that the pain initiated in his chest and then radiated down to his abdomen.  Past Medical History  Diagnosis Date  . Chest pain   . Shoulder pain   . Depression   . Erectile dysfunction   . GERD (gastroesophageal reflux disease)   . Hyperlipidemia   . HTN (hypertension)   . Insomnia   . Dysuria   . Gastroenteritis   . Insomnia   . Onychomycosis   . High cholesterol   . Gout   . Prostate cancer ([place])   . Basal cell carcinoma   . Nephrolithiasis   . Arthritis     right knee   Past Surgical History  Procedure Laterality Date  . Prostatectomy    . Cystoscopy w/  ureteral stent placement Left 01/30/2014    Procedure: CYSTOSCOPY WITH RETROGRADE PYELOGRAM/URETEROSCOPY/URETERAL STENT PLACEMENT;  Surgeon: Bernestine Amass, MD;  Location: [facility];  Service: Urology;  Laterality: Left;  . Holmium laser application Left [id number]    Procedure: HOLMIUM LASER APPLICATION;  Surgeon: Bernestine Amass, MD;  Location: [facility];  Service: Urology;  Laterality: Left;  . Colonoscopy     Family History  Problem Relation Age of Onset  . Stroke Father   . Heart attack Father   . Colon cancer Mother   . Esophageal cancer Neg Hx   . Pancreatic cancer Neg Hx   . Prostate cancer Neg Hx   . Rectal cancer Neg Hx   . Stomach cancer Neg Hx    Social History  Substance Use Topics  . Smoking status: Never Smoker   . Smokeless tobacco: Never Used  . Alcohol Use: 0.0 oz/week    0 Standard drinks or equivalent per week     Comment: "a beer once in a while" ; 2 times monthly    Review of Systems  Constitutional: Negative for fever and chills.  HENT: Negative for congestion and facial swelling.   Eyes: Negative for discharge and visual disturbance.  Respiratory: Negative for shortness of breath.   Cardiovascular: Negative for chest pain and palpitations.  Gastrointestinal: Positive for nausea, vomiting, abdominal pain and  constipation. Negative for diarrhea.  Musculoskeletal: Negative for myalgias and arthralgias.  Skin: Negative for color change and rash.  Neurological: Negative for tremors, syncope and headaches.  Psychiatric/Behavioral: Negative for confusion and dysphoric mood.      Allergies  Review of patient's allergies indicates no known allergies.  Home Medications   Prior to Admission medications   Medication Sig Start Date End Date Taking? Authorizing Provider  allopurinol (ZYLOPRIM) 300 MG tablet Take 300 mg by mouth daily.   Yes Historical Provider, MD  ALPRAZolam Duanne Moron) 1 MG tablet Take 0.5 mg by mouth daily as needed for anxiety.  12/07/13  Yes  Historical Provider, MD  ibuprofen (ADVIL,MOTRIN) 200 MG tablet Take 400 mg by mouth every 6 (six) hours as needed for mild pain or moderate pain.    Yes Historical Provider, MD  lisinopril (PRINIVIL,ZESTRIL) 40 MG tablet Take 1 tablet by mouth daily. 04/20/15  Yes Historical Provider, MD  omeprazole (PRILOSEC OTC) 20 MG tablet Take 20 mg by mouth daily.   Yes Historical Provider, MD  Simethicone (GAS-X PO) Take 2 tablets by mouth daily as needed (gas).   Yes Historical Provider, MD  simvastatin (ZOCOR) 40 MG tablet Take 40 mg by mouth every evening.   Yes Historical Provider, MD   BP 134/82 mmHg  Pulse 91  Temp(Src) 98.2 F (36.8 C) (Oral)  Resp 18  SpO2 98% Physical Exam  Constitutional: He is oriented to person, place, and time. He appears well-developed and well-nourished.  HENT:  Head: Normocephalic and atraumatic.  Eyes: EOM are normal. Pupils are equal, round, and reactive to light.  Neck: Normal range of motion. Neck supple. No JVD present.  Cardiovascular: Normal rate and regular rhythm.  Exam reveals no gallop and no friction rub.   No murmur heard. Pulmonary/Chest: No respiratory distress. He has no wheezes.  Abdominal: He exhibits distension (tympanitic). There is tenderness. There is no rebound and no guarding.  Musculoskeletal: Normal range of motion.  Neurological: He is alert and oriented to person, place, and time.  Skin: No rash noted. No pallor.  Psychiatric: He has a normal mood and affect. His behavior is normal.  Nursing note and vitals reviewed.   ED Course  Procedures (including critical care time) Labs Review Labs Reviewed  COMPREHENSIVE METABOLIC PANEL - Abnormal; Notable for the following:    Glucose, Bld 108 (*)    All other components within normal limits  CBC WITH DIFFERENTIAL/PLATELET - Abnormal; Notable for the following:    WBC 19.0 (*)    Neutro Abs 15.9 (*)    Monocytes Absolute 2.0 (*)    All other components within normal limits  LIPASE,  BLOOD  URINALYSIS, ROUTINE W REFLEX MICROSCOPIC (NOT AT [facility])  I-STAT CG4 LACTIC ACID, ED    Imaging Review Ct Angio Chest Pe W/cm &/or Wo Cm  07/27/2015  CLINICAL DATA:  Acute onset of severe bilateral rib pain. Recent colonoscopy with polyp removal. Shortness of breath, nausea and diaphoresis. Initial encounter. EXAM: CT ANGIOGRAPHY CHEST CT ABDOMEN AND PELVIS WITH CONTRAST TECHNIQUE: Multidetector CT imaging of the chest was performed using the standard protocol during bolus administration of intravenous contrast. Multiplanar CT image reconstructions and MIPs were obtained to evaluate the vascular anatomy. Multidetector CT imaging of the abdomen and pelvis was performed using the standard protocol during bolus administration of intravenous contrast. CONTRAST:  100 mL of Isovue 370 IV contrast COMPARISON:  CT of the abdomen and pelvis from 01/29/2014, and chest radiograph performed 07/26/2015 FINDINGS: CTA  CHEST FINDINGS There is no evidence of pulmonary embolus. Minimal bilateral atelectasis is noted. The lungs are otherwise clear. There is no evidence of significant focal consolidation, pleural effusion or pneumothorax. No masses are identified; no abnormal focal contrast enhancement is seen. Mild coronary artery calcification is noted. No mediastinal lymphadenopathy is seen. No pericardial effusion is identified. The great vessels are grossly unremarkable in appearance. No axillary lymphadenopathy is seen. The visualized portions of the thyroid gland are unremarkable in appearance. No acute osseous abnormalities are seen. CT ABDOMEN and PELVIS FINDINGS The liver and spleen are unremarkable in appearance. The gallbladder is within normal limits. The pancreas and adrenal glands are unremarkable. Scattered small bilateral renal cysts are seen. There is no evidence of hydronephrosis. No renal or ureteral stones are seen. No significant perinephric stranding is appreciated. No free fluid is identified. The  small bowel is unremarkable in appearance. The stomach is within normal limits. No acute vascular abnormalities are seen. The appendix is normal in caliber, without evidence of appendicitis. The colon is unremarkable in appearance. The bladder is mildly distended and grossly unremarkable. A nonspecific 7.7 cm cystic structure with peripheral calcification is noted at the left upper hemipelvis. This is stable from 2016 and likely benign. The patient is status post prostatectomy. No inguinal lymphadenopathy is seen. No acute osseous abnormalities are identified. Review of the MIP images confirms the above findings. IMPRESSION: 1. No evidence of pulmonary embolus. 2. Minimal bibasilar atelectasis noted.  Lungs otherwise clear. 3. Mild coronary artery calcifications seen. 4. Scattered small bilateral renal cysts seen. 5. Stable nonspecific 7.7 cm cystic structure with peripheral calcification at the left upper hemipelvis is likely benign. Electronically Signed   By: Garald Balding M.D.   On: 07/27/2015 01:58   Ct Abdomen Pelvis W Contrast  07/27/2015  CLINICAL DATA:  Acute onset of severe bilateral rib pain. Recent colonoscopy with polyp removal. Shortness of breath, nausea and diaphoresis. Initial encounter. EXAM: CT ANGIOGRAPHY CHEST CT ABDOMEN AND PELVIS WITH CONTRAST TECHNIQUE: Multidetector CT imaging of the chest was performed using the standard protocol during bolus administration of intravenous contrast. Multiplanar CT image reconstructions and MIPs were obtained to evaluate the vascular anatomy. Multidetector CT imaging of the abdomen and pelvis was performed using the standard protocol during bolus administration of intravenous contrast. CONTRAST:  100 mL of Isovue 370 IV contrast COMPARISON:  CT of the abdomen and pelvis from 01/29/2014, and chest radiograph performed 07/26/2015 FINDINGS: CTA CHEST FINDINGS There is no evidence of pulmonary embolus. Minimal bilateral atelectasis is noted. The lungs are  otherwise clear. There is no evidence of significant focal consolidation, pleural effusion or pneumothorax. No masses are identified; no abnormal focal contrast enhancement is seen. Mild coronary artery calcification is noted. No mediastinal lymphadenopathy is seen. No pericardial effusion is identified. The great vessels are grossly unremarkable in appearance. No axillary lymphadenopathy is seen. The visualized portions of the thyroid gland are unremarkable in appearance. No acute osseous abnormalities are seen. CT ABDOMEN and PELVIS FINDINGS The liver and spleen are unremarkable in appearance. The gallbladder is within normal limits. The pancreas and adrenal glands are unremarkable. Scattered small bilateral renal cysts are seen. There is no evidence of hydronephrosis. No renal or ureteral stones are seen. No significant perinephric stranding is appreciated. No free fluid is identified. The small bowel is unremarkable in appearance. The stomach is within normal limits. No acute vascular abnormalities are seen. The appendix is normal in caliber, without evidence of appendicitis. The colon is unremarkable  in appearance. The bladder is mildly distended and grossly unremarkable. A nonspecific 7.7 cm cystic structure with peripheral calcification is noted at the left upper hemipelvis. This is stable from 2016 and likely benign. The patient is status post prostatectomy. No inguinal lymphadenopathy is seen. No acute osseous abnormalities are identified. Review of the MIP images confirms the above findings. IMPRESSION: 1. No evidence of pulmonary embolus. 2. Minimal bibasilar atelectasis noted.  Lungs otherwise clear. 3. Mild coronary artery calcifications seen. 4. Scattered small bilateral renal cysts seen. 5. Stable nonspecific 7.7 cm cystic structure with peripheral calcification at the left upper hemipelvis is likely benign. Electronically Signed   By: Garald Balding M.D.   On: 07/27/2015 01:58   Dg Abd 2  Views  07/27/2015  CLINICAL DATA:  Generalized abdominal pain. Status post colonoscopy 1 day prior. EXAM: ABDOMEN - 2 VIEW COMPARISON:  07/27/2015 CT abdomen/ pelvis. FINDINGS: No dilated small bowel loops. Oral contrast has progressed to the splenic flexure of the colon. No evidence of pneumatosis, pneumoperitoneum or pathologic soft tissue calcification. Moderate thoracolumbar spondylosis. IMPRESSION: Nonobstructive bowel gas pattern. Progression of oral contrast to the splenic flexure of the colon. No evidence of pneumatosis or pneumoperitoneum. Electronically Signed   By: Ilona Sorrel M.D.   On: 07/27/2015 14:29   Dg Abd Acute W/chest  07/26/2015  CLINICAL DATA:  Severe upper abdominal pain in chest pain beginning 2 hours ago. Colonoscopy due date. EXAM: DG ABDOMEN ACUTE W/ 1V CHEST COMPARISON:  07/07/2015 FINDINGS: No evidence of ileus, obstruction or free air. The patient appears to have fecal matter in the colon, unusual given the history of colonoscopy due day. No abnormal calcifications are bony findings. One view chest shows normal heart and mediastinal shadows. The lungs are clear. No free air seen under the diaphragm. IMPRESSION: Negative abdominal radiographs.  No acute cardiopulmonary disease. Electronically Signed   By: Nelson Chimes M.D.   On: 07/26/2015 23:38   Ct Angio Chest/abd/pel For Dissection W And/or W/wo  07/27/2015  CLINICAL DATA:  55 year old male with severe lower abdominal and right flank pain one day status post colonoscopy. Chest pain and nausea and vomiting one night prior. Leukocytosis. EXAM: CT ANGIOGRAPHY CHEST, ABDOMEN AND PELVIS TECHNIQUE: Multidetector CT imaging through the chest, abdomen and pelvis was performed using the standard protocol during bolus administration of intravenous contrast. Multiplanar reconstructed images and MIPs were obtained and reviewed to evaluate the vascular anatomy. CONTRAST:  100 cc Isovue 370 IV. COMPARISON:  07/27/2015 CT chest, abdomen and  pelvis (earlier today). FINDINGS: CTA CHEST FINDINGS Mediastinum/Nodes: Normal heart size. No significant pericardial fluid/thickening. Left anterior descending, left circumflex and right coronary atherosclerosis. Minimally atherosclerotic nonaneurysmal thoracic aorta. No acute intramural aortic hematoma. No aortic dissection, pseudoaneurysm or penetrating atherosclerotic ulcer. Aortic arch vessels are patent. Normal caliber pulmonary arteries. No central pulmonary emboli. No discrete thyroid nodules. Unremarkable esophagus. No pathologically enlarged axillary, mediastinal or hilar lymph nodes. Lungs/Pleura: No pneumothorax. No pleural effusion. Mild hypoventilatory changes in the dependent lower lobes. No acute consolidative airspace disease, significant pulmonary nodules or lung masses. Musculoskeletal: No aggressive appearing focal osseous lesions. Mild-to-moderate thoracic spondylosis. Subcutaneous circumscribed 2.8 x 1.5 cm cystic structure in the medial left upper back (series 2/image 6), with mild wall thickening and surrounding mild fat stranding, favor a sebaceous cyst. Review of the MIP images confirms the above findings. CTA ABDOMEN AND PELVIS FINDINGS Hepatobiliary: Mild-to-moderate diffuse hepatic steatosis. No liver mass. No definite liver surface irregularity. Mild gallbladder distention. Suggestion of mild diffuse  gallbladder wall thickening and pericholecystic fat stranding, new since this morning. No radiopaque cholelithiasis. No biliary ductal dilatation. Pancreas: Normal, with no mass or duct dilation. Spleen: Normal size. No mass. Adrenals/Urinary Tract: Normal adrenals. Subcentimeter hypodense renal cortical lesions in the mid to lower right kidney are too small to characterize. Otherwise normal kidneys, with no hydronephrosis. Normal bladder. Stomach/Bowel: Grossly normal stomach. Mild wall thickening in the descending duodenum with mild associated fat stranding surrounding the descending  duodenum, which appears new. Normal caliber small bowel with no small bowel wall thickening. Normal appendix. Normal large bowel with no diverticulosis, large bowel wall thickening or pericolonic fat stranding. No pneumatosis. Vascular/Lymphatic: Mildly atherosclerotic nonaneurysmal abdominal aorta. No abdominal aortic dissection, pseudoaneurysm or penetrating atherosclerotic ulcer. Celiac artery, superior mesenteric artery, splenic artery, common hepatic artery, inferior mesenteric artery and bilateral single appearing renal arteries appear patent. Splenic and renal veins are patent. No pathologically enlarged lymph nodes in the abdomen or pelvis. Reproductive: Normal size prostate. Other: No pneumoperitoneum, ascites or focal fluid collection. Peripherally calcified circumscribed 4.4 x 4.2 cm cystic structure in the left anterior pelvis abutting the left external iliac vein (series 5/ image 167) is not appreciably changed in size since 09/18/2013, consistent with a benign extraperitoneal left pelvic cyst. Musculoskeletal: No aggressive appearing focal osseous lesions. Mild lumbar spondylosis. Review of the MIP images confirms the above findings. IMPRESSION: 1. No acute aortic syndrome.  Aortic atherosclerosis. 2. New gallbladder and descending duodenal wall thickening and new pericholecystic and peri-duodenal fat stranding. These findings are of uncertain etiology. Acute cholecystitis is a consideration. No radiopaque cholelithiasis. Acute infectious or inflammatory duodenitis or duodenal ulcer disease are considerations. Consider correlation with right upper quadrant abdominal sonogram. 3. No biliary ductal dilatation. 4. No pneumatosis or pneumoperitoneum.  No focal fluid collections. 5. Benign-appearing rim calcified extraperitoneal left anterior pelvic cyst is stable since 2015. 6. Three-vessel coronary atherosclerosis. 7. Mild-to-moderate diffuse hepatic steatosis. Electronically Signed   By: Ilona Sorrel  M.D.   On: 07/27/2015 17:54   I have personally reviewed and evaluated these images and lab results as part of my medical decision-making.   EKG Interpretation None      MDM   Final diagnoses:  RUQ abdominal pain  Diffuse abdominal pain    55 yo M with a chief complaints of abdominal pain. This happened after colonoscopy. The patient had imaging studies that were negative as was yesterday. I will obtain a CT angiogram to evaluate for possible dissection.  CT scan is negative for acute dissection however there is some concern of gallbladder pathology on the CT scan. The patient was also found to have a possible duodenitis. Patient does have a leukocytosis of 19,000. With this finding and patient having continued severe abdominal pain will obtain a right upper quadrant ultrasound.  With this finding I feel that patient with intractable abdominal pain will likely need to be admitted if there is no acute cholecystitis. Patient will be given IV Zosyn.  The patients results and plan were reviewed and discussed.   Any x-rays performed were independently reviewed by myself.   Differential diagnosis were considered with the presenting HPI.  Medications  diatrizoate meglumine-sodium (GASTROGRAFIN) 66-10 % solution 15 mL (not administered)  HYDROmorphone (DILAUDID) injection 1 mg (not administered)  ondansetron (ZOFRAN) injection 4 mg (not administered)  piperacillin-tazobactam (ZOSYN) IVPB 3.375 g (not administered)  sodium chloride 0.9 % bolus 1,000 mL (1,000 mLs Intravenous New Bag/Given 07/27/15 1614)  ondansetron (ZOFRAN) injection 4 mg (4 mg Intravenous  Given 07/27/15 1614)  HYDROmorphone (DILAUDID) injection 1 mg (1 mg Intravenous Given 07/27/15 1614)  gi cocktail (Maalox,Lidocaine,Donnatal) (30 mLs Oral Given 07/27/15 1614)  iopamidol (ISOVUE-370) 76 % injection 100 mL (100 mLs Intravenous Contrast Given 07/27/15 1714)    Filed Vitals:   07/27/15 1532 07/27/15 1533 07/27/15 1751  BP:   125/81 134/82  Pulse:  95 91  Temp:  98.8 F (37.1 C) 98.2 F (36.8 C)  TempSrc:  Oral Oral  Resp:  18 18  SpO2: 100% 98% 98%    Final diagnoses:  RUQ abdominal pain  Diffuse abdominal pain       Deno Etienne, DO 07/27/15 1809

## 2015-07-27 NOTE — Progress Notes (Signed)
HPI :  55 y/o male here for an acute visit today, after having a colonoscopy yesterday at 9 AM.   He had 2 small ascending colon polyps and one small transverse colon polyp removed with cold snare. No cautery used. Procedure lasted 16 minutes.   He reports around dinner time last night around 7PM he ate a chicken sandwish and he had chest pain like he has never had before, and then developed abdominal pain. He reports his BP was 188/105, he went to [facility] and gave him morphine which did not help too much. She had a CT angio of the chest and abdomen, labs which were normal.  He was discharged this morning but reports he continues to feel poorly.    He has has ongoing pain in his flanks and lower abdomen. He also has some intermittent chest pains. Pain has been constant since it started in the abdomen. Pain is rated 6-7/10. He does not think the pain is getting any better. He has not passed any gas since last night, no bowel movements. He feels distended. He thinks he passed air after the colonoscopy itself. He thinks he has worsening pain with a deep breath. He has been tolerating PO, he has had vomiting last night, none today. He is not sure if he has had a fever. Tachycardia noted in the office today. Repeat xray showed no free air. WBC noted to be 20s which is new from yesterday.     Past Medical History  Diagnosis Date  . Chest pain   . Shoulder pain   . Depression   . Erectile dysfunction   . GERD (gastroesophageal reflux disease)   . Hyperlipidemia   . HTN (hypertension)   . Insomnia   . Dysuria   . Gastroenteritis   . Insomnia   . Onychomycosis   . High cholesterol   . Gout   . Prostate cancer ([place])   . Basal cell carcinoma   . Nephrolithiasis   . Arthritis     right knee     Past Surgical History  Procedure Laterality Date  . Prostatectomy    . Cystoscopy w/ ureteral stent placement Left 01/30/2014    Procedure: CYSTOSCOPY WITH RETROGRADE  PYELOGRAM/URETEROSCOPY/URETERAL STENT PLACEMENT;  Surgeon: Bernestine Amass, MD;  Location: [facility];  Service: Urology;  Laterality: Left;  . Holmium laser application Left [id number]    Procedure: HOLMIUM LASER APPLICATION;  Surgeon: Bernestine Amass, MD;  Location: [facility];  Service: Urology;  Laterality: Left;  . Colonoscopy     Family History  Problem Relation Age of Onset  . Stroke Father   . Heart attack Father   . Colon cancer Mother   . Esophageal cancer Neg Hx   . Pancreatic cancer Neg Hx   . Prostate cancer Neg Hx   . Rectal cancer Neg Hx   . Stomach cancer Neg Hx    Social History  Substance Use Topics  . Smoking status: Never Smoker   . Smokeless tobacco: Never Used  . Alcohol Use: 0.0 oz/week    0 Standard drinks or equivalent per week     Comment: "a beer once in a while" ; 2 times monthly   Current Outpatient Prescriptions  Medication Sig Dispense Refill  . allopurinol (ZYLOPRIM) 300 MG tablet Take 300 mg by mouth daily.    Marland Kitchen ALPRAZolam (XANAX) 1 MG tablet Take 0.5 mg by mouth daily as needed for anxiety.     Marland Kitchen ibuprofen (  ADVIL,MOTRIN) 200 MG tablet Take 400-600 mg by mouth every 6 (six) hours as needed for mild pain.    Marland Kitchen lisinopril (PRINIVIL,ZESTRIL) 40 MG tablet Take 1 tablet by mouth daily.    Marland Kitchen omeprazole (PRILOSEC OTC) 20 MG tablet Take 20 mg by mouth daily.    . simvastatin (ZOCOR) 40 MG tablet Take 40 mg by mouth every evening.     No current facility-administered medications for this visit.   No Known Allergies   Review of Systems: All systems reviewed and negative except where noted in HPI.    Dg Chest 2 View  07/07/2015  CLINICAL DATA:  Chest pain. EXAM: CHEST  2 VIEW COMPARISON:  Radiograph of April 15, 2015. FINDINGS: The heart size and mediastinal contours are within normal limits. Both lungs are clear. No pneumothorax or pleural effusion is noted. The visualized skeletal structures are unremarkable. IMPRESSION: No active cardiopulmonary disease.  Electronically Signed   By: Marijo Conception, M.D.   On: 07/07/2015 15:38   Ct Angio Chest Pe W/cm &/or Wo Cm  07/27/2015  CLINICAL DATA:  Acute onset of severe bilateral rib pain. Recent colonoscopy with polyp removal. Shortness of breath, nausea and diaphoresis. Initial encounter. EXAM: CT ANGIOGRAPHY CHEST CT ABDOMEN AND PELVIS WITH CONTRAST TECHNIQUE: Multidetector CT imaging of the chest was performed using the standard protocol during bolus administration of intravenous contrast. Multiplanar CT image reconstructions and MIPs were obtained to evaluate the vascular anatomy. Multidetector CT imaging of the abdomen and pelvis was performed using the standard protocol during bolus administration of intravenous contrast. CONTRAST:  100 mL of Isovue 370 IV contrast COMPARISON:  CT of the abdomen and pelvis from 01/29/2014, and chest radiograph performed 07/26/2015 FINDINGS: CTA CHEST FINDINGS There is no evidence of pulmonary embolus. Minimal bilateral atelectasis is noted. The lungs are otherwise clear. There is no evidence of significant focal consolidation, pleural effusion or pneumothorax. No masses are identified; no abnormal focal contrast enhancement is seen. Mild coronary artery calcification is noted. No mediastinal lymphadenopathy is seen. No pericardial effusion is identified. The great vessels are grossly unremarkable in appearance. No axillary lymphadenopathy is seen. The visualized portions of the thyroid gland are unremarkable in appearance. No acute osseous abnormalities are seen. CT ABDOMEN and PELVIS FINDINGS The liver and spleen are unremarkable in appearance. The gallbladder is within normal limits. The pancreas and adrenal glands are unremarkable. Scattered small bilateral renal cysts are seen. There is no evidence of hydronephrosis. No renal or ureteral stones are seen. No significant perinephric stranding is appreciated. No free fluid is identified. The small bowel is unremarkable in  appearance. The stomach is within normal limits. No acute vascular abnormalities are seen. The appendix is normal in caliber, without evidence of appendicitis. The colon is unremarkable in appearance. The bladder is mildly distended and grossly unremarkable. A nonspecific 7.7 cm cystic structure with peripheral calcification is noted at the left upper hemipelvis. This is stable from 2016 and likely benign. The patient is status post prostatectomy. No inguinal lymphadenopathy is seen. No acute osseous abnormalities are identified. Review of the MIP images confirms the above findings. IMPRESSION: 1. No evidence of pulmonary embolus. 2. Minimal bibasilar atelectasis noted.  Lungs otherwise clear. 3. Mild coronary artery calcifications seen. 4. Scattered small bilateral renal cysts seen. 5. Stable nonspecific 7.7 cm cystic structure with peripheral calcification at the left upper hemipelvis is likely benign. Electronically Signed   By: Garald Balding M.D.   On: 07/27/2015 01:58   Ct Abdomen  Pelvis W Contrast  07/27/2015  CLINICAL DATA:  Acute onset of severe bilateral rib pain. Recent colonoscopy with polyp removal. Shortness of breath, nausea and diaphoresis. Initial encounter. EXAM: CT ANGIOGRAPHY CHEST CT ABDOMEN AND PELVIS WITH CONTRAST TECHNIQUE: Multidetector CT imaging of the chest was performed using the standard protocol during bolus administration of intravenous contrast. Multiplanar CT image reconstructions and MIPs were obtained to evaluate the vascular anatomy. Multidetector CT imaging of the abdomen and pelvis was performed using the standard protocol during bolus administration of intravenous contrast. CONTRAST:  100 mL of Isovue 370 IV contrast COMPARISON:  CT of the abdomen and pelvis from 01/29/2014, and chest radiograph performed 07/26/2015 FINDINGS: CTA CHEST FINDINGS There is no evidence of pulmonary embolus. Minimal bilateral atelectasis is noted. The lungs are otherwise clear. There is no  evidence of significant focal consolidation, pleural effusion or pneumothorax. No masses are identified; no abnormal focal contrast enhancement is seen. Mild coronary artery calcification is noted. No mediastinal lymphadenopathy is seen. No pericardial effusion is identified. The great vessels are grossly unremarkable in appearance. No axillary lymphadenopathy is seen. The visualized portions of the thyroid gland are unremarkable in appearance. No acute osseous abnormalities are seen. CT ABDOMEN and PELVIS FINDINGS The liver and spleen are unremarkable in appearance. The gallbladder is within normal limits. The pancreas and adrenal glands are unremarkable. Scattered small bilateral renal cysts are seen. There is no evidence of hydronephrosis. No renal or ureteral stones are seen. No significant perinephric stranding is appreciated. No free fluid is identified. The small bowel is unremarkable in appearance. The stomach is within normal limits. No acute vascular abnormalities are seen. The appendix is normal in caliber, without evidence of appendicitis. The colon is unremarkable in appearance. The bladder is mildly distended and grossly unremarkable. A nonspecific 7.7 cm cystic structure with peripheral calcification is noted at the left upper hemipelvis. This is stable from 2016 and likely benign. The patient is status post prostatectomy. No inguinal lymphadenopathy is seen. No acute osseous abnormalities are identified. Review of the MIP images confirms the above findings. IMPRESSION: 1. No evidence of pulmonary embolus. 2. Minimal bibasilar atelectasis noted.  Lungs otherwise clear. 3. Mild coronary artery calcifications seen. 4. Scattered small bilateral renal cysts seen. 5. Stable nonspecific 7.7 cm cystic structure with peripheral calcification at the left upper hemipelvis is likely benign. Electronically Signed   By: Garald Balding M.D.   On: 07/27/2015 01:58   Dg Abd Acute W/chest  07/26/2015  CLINICAL DATA:   Severe upper abdominal pain in chest pain beginning 2 hours ago. Colonoscopy due date. EXAM: DG ABDOMEN ACUTE W/ 1V CHEST COMPARISON:  07/07/2015 FINDINGS: No evidence of ileus, obstruction or free air. The patient appears to have fecal matter in the colon, unusual given the history of colonoscopy due day. No abnormal calcifications are bony findings. One view chest shows normal heart and mediastinal shadows. The lungs are clear. No free air seen under the diaphragm. IMPRESSION: Negative abdominal radiographs.  No acute cardiopulmonary disease. Electronically Signed   By: Nelson Chimes M.D.   On: 07/26/2015 23:38   Lab Results  Component Value Date   WBC 19.0* 07/27/2015   HGB 15.3 07/27/2015   HCT 43.3 07/27/2015   MCV 86.4 07/27/2015   PLT 266 07/27/2015   Lab Results  Component Value Date   CREATININE 1.10 07/27/2015   BUN 17 07/27/2015   NA 136 07/27/2015   K 3.9 07/27/2015   CL 104 07/27/2015   CO2 23  07/27/2015    Lab Results  Component Value Date   ALT 30 07/27/2015   AST 27 07/27/2015   ALKPHOS 54 07/27/2015   BILITOT 1.0 07/27/2015      Physical Exam: BP 110/60 mmHg  Pulse 113  Ht 5\' 10"  (1.778 m)  Wt 243 lb (110.224 kg)  BMI 34.87 kg/m2 Constitutional: Pleasant,well-developed, male HEENT: Normocephalic and atraumatic. Conjunctivae are normal. No scleral icterus. Neck supple.  Cardiovascular: Normal rate, regular rhythm.  Pulmonary/chest: Effort normal and breath sounds normal. No wheezing, rales or rhonchi. Abdominal: Soft, protuberant, pain with palpation to the RLQ and some in the left abdomen, no rebound or guarding,  There are no masses palpable.  Extremities: no edema Lymphadenopathy: No cervical adenopathy noted. Neurological: Alert and oriented to person place and time. Skin: Skin is warm and dry. No rashes noted. Psychiatric: Normal mood and affect. Behavior is normal.   ASSESSMENT AND PLAN: 55 yo/ male who is s/p colonoscopy yesterday in which no  high risk pathology was noted, small polyps removed without cautery, presenting with abdominal pain, tachycardia, leukocytosis. I reviewed prior CT from last night with radiology, no evidence of perforation, splenic or liver injury appreciated. His leukocytosis is new. Repeat xray does not show free air but given his symptoms, tachycardia, and leukocytosis he needs repeat CT imaging and admission for further care. It's not clear what is driving this process, but will await repeat imaging to see if there are any interval changes. GI inpatient service is aware. I discussed plan with patient and he agreed. He was taken from clinic to the ER via ambulance. ER contacted with plan for repeat imaging STAT.  Haena Cellar, MD [facility] Pager [phone]

## 2015-07-27 NOTE — Telephone Encounter (Signed)
Patient saw Dr. Havery Moros in the office on 07/27/2015. See office encounter.

## 2015-07-27 NOTE — Telephone Encounter (Signed)
Patient was notified that Dr. Havery Moros will see him in the office today at 1:00 PM. Patient was told to be here at 12:45 PM. Patient states that he will be here at that time.

## 2015-07-27 NOTE — ED Notes (Signed)
Pt from [facility] with complaints of lower abdominal pain and rt flank pain that he rates at 7/10. Pt had a colonoscopy yesterday. Pt had slight CP and nausea with 3 episodes of emesis last night and was evaluated for this. Pt states he currently has nausea but denies emesis. PCP sent him here because he had an elevated white count and they wanted him to be further evaluated

## 2015-07-27 NOTE — Telephone Encounter (Signed)
  Follow up Call-  Call back number 07/26/2015  Post procedure Call Back phone  # [phone] cell  Permission to leave phone message Yes     Patient questions:  Do you have a fever, pain , or abdominal swelling? Yes.   Pain Score  10 *  Have you tolerated food without any problems? No.  Have you been able to return to your normal activities? No.  Do you have any questions about your discharge instructions: Diet   No. Medications  No. Follow up visit  No.  Do you have questions or concerns about your Care? No.  Actions: * If pain score is 4 or above: Physician notified.  Patient was seen at [facility] last night for abdominal pain that starts in his lower abdomen up into his chest. Patient was treated and released from [facility] after CT and X-Ray was normal. Blood work was normal also. Patient was given pain medication that did not relieve the pain. Patient is crying in pain. Patient thinks that he is running a low fever.

## 2015-07-27 NOTE — H&P (Signed)
History and Physical    David Hartman [id number] DOB: 04/03/1960 DOA: 07/27/2015  PCP: David Hartman   Patient coming from: Home, via GI clinic   Chief Complaint: Severe abdominal pain   HPI: David Hartman is a 55 y.o. male with medical history significant for prostate cancer status post robotic surgery, hypertension, hyperlipidemia, GERD, and anxiety who presents the emergency department at the direction of his gastroenterologist for evaluation of severe abdominal pain with tachycardia and temperature of 37.8 C. Patient underwent routine screening colonoscopy yesterday with removal of 2 small polyps from the ascending colon and 2 small polyps from the transverse colon with cold snare. Tolerated the procedure well with no complications and had been doing fine back at home yesterday until he was eating a dinner of grilled chicken and a milk shake and developed severe, acute pain across the anterior chest which concerned the patient that he was having a heart attack. He was evaluated for these symptoms at [facility] emergency department and ruled out for ACS with serial enzymes and nonischemic EKG. CT of the abdomen and pelvis was also performed during that visit and negative for any acute complication that would explain the patient's symptoms. He was discharged home with analgesics and followed up today in the gastroenterology clinic. At [facility], his pain shifted from the chest down to the abdomen and she has experienced persistent severe abdominal pain throughout the day, seemingly unaffected by his opiate analgesic. Pain is described as sharp, severe, localized to the bilateral flanks and lower abdomen, worse with deep inspiration or movement, and with no alleviating factors identified. Patient denies melena or hematochezia. He denies dysuria or gross hematuria. He was noted to be tachycardic in the clinic and with temperature of 37.8 C. Blood work was obtained and notable for  a markedly elevated white count. He was sent to the emergency department for further evaluation with repeat CT.  ED Course: Upon arrival to the ED, patient is found to be afebrile, saturating well on room air, and with vitals otherwise stable. EKG demonstrates sinus rhythm with first-degree AV block. CMP is unremarkable and CBC features a leukocytosis to [id number] with neutrophilic predominance. Urinalysis is notable for moderate hemoglobin and elevated specific gravity and lactic acid is reassuring at 1.78. CT of the abdomen and pelvis with contrast was obtained and notable for thickening of the gallbladder wall and descending duodenal wall which is new from the study the day prior. There is also a new fat stranding surrounding the gallbladder and duodenum. This was followed up by ultrasound of the right upper quadrant which demonstrates hepatic steatosis but is not suggestive of cholecystitis. Patient was evaluated by his gastroenterologist after these studies have been obtained and observation was advised with pain control, PPI, empiric antibiotics, and nothing by mouth. GI will plan to see the patient in the morning. 1 L of normal saline was bolused in the ED and patient was treated symptomatically with Zofran, Dilaudid, and a GI cocktail. Empiric Zosyn was administered. He has remained hemodynamically stable in the ED and will be observed on the medical-surgical unit for ongoing evaluation and management of intractable abdominal pain with duodenitis.  Review of Systems:  All other systems reviewed and apart from HPI, are negative.  Past Medical History  Diagnosis Date  . Chest pain   . Shoulder pain   . Depression   . Erectile dysfunction   . GERD (gastroesophageal reflux disease)   . Hyperlipidemia   . HTN (hypertension)   .  Insomnia   . Dysuria   . Gastroenteritis   . Insomnia   . Onychomycosis   . High cholesterol   . Gout   . Prostate cancer ([place])   . Basal cell carcinoma   .  Nephrolithiasis   . Arthritis     right knee    Past Surgical History  Procedure Laterality Date  . Prostatectomy    . Cystoscopy w/ ureteral stent placement Left 01/30/2014    Procedure: CYSTOSCOPY WITH RETROGRADE PYELOGRAM/URETEROSCOPY/URETERAL STENT PLACEMENT;  Surgeon: Bernestine Amass, MD;  Location: [facility] ORS;  Service: Urology;  Laterality: Left;  . Holmium laser application Left [id number]    Procedure: HOLMIUM LASER APPLICATION;  Surgeon: Bernestine Amass, MD;  Location: [facility];  Service: Urology;  Laterality: Left;  . Colonoscopy       reports that he has never smoked. He has never used smokeless tobacco. He reports that he drinks alcohol. He reports that he does not use illicit drugs.  No Known Allergies  Family History  Problem Relation Age of Onset  . Stroke Father   . Heart attack Father   . Colon cancer Mother   . Esophageal cancer Neg Hx   . Pancreatic cancer Neg Hx   . Prostate cancer Neg Hx   . Rectal cancer Neg Hx   . Stomach cancer Neg Hx      Prior to Admission medications   Medication Sig Start Date End Date Taking? Authorizing Provider  allopurinol (ZYLOPRIM) 300 MG tablet Take 300 mg by mouth daily.   Yes Historical Provider, MD  ALPRAZolam Duanne Moron) 1 MG tablet Take 0.5 mg by mouth daily as needed for anxiety.  12/07/13  Yes Historical Provider, MD  ibuprofen (ADVIL,MOTRIN) 200 MG tablet Take 400 mg by mouth every 6 (six) hours as needed for mild pain or moderate pain.    Yes Historical Provider, MD  lisinopril (PRINIVIL,ZESTRIL) 40 MG tablet Take 1 tablet by mouth daily. 04/20/15  Yes Historical Provider, MD  omeprazole (PRILOSEC OTC) 20 MG tablet Take 20 mg by mouth daily.   Yes Historical Provider, MD  Simethicone (GAS-X PO) Take 2 tablets by mouth daily as needed (gas).   Yes Historical Provider, MD  simvastatin (ZOCOR) 40 MG tablet Take 40 mg by mouth every evening.   Yes Historical Provider, MD    Physical Exam: Filed Vitals:   07/27/15 1532 07/27/15  1533 07/27/15 1751  BP:  125/81 134/82  Pulse:  95 91  Temp:  98.8 F (37.1 C) 98.2 F (36.8 C)  TempSrc:  Oral Oral  Resp:  18 18  SpO2: 100% 98% 98%      Constitutional: NAD, calm, in apparent discomfort  Eyes: PERTLA, lids and conjunctivae normal ENMT: Mucous membranes are moist. Posterior pharynx clear of any exudate or lesions.   Neck: normal, supple, no masses, no thyromegaly Respiratory: clear to auscultation bilaterally, no wheezing, no crackles. Normal respiratory effort.   Cardiovascular: S1 & S2 heard, regular rate and rhythm, no significant murmurs, hyperdynamic precodium. No extremity edema. 2+ pedal pulses. No significant JVD. Abdomen: No distension, tender throughout without guarding or rebound pain, no masses palpated. Bowel sounds normal.  Musculoskeletal: no clubbing / cyanosis. No joint deformity upper and lower extremities. Normal muscle tone.  Skin: no significant rashes, lesions, ulcers. Warm, dry, well-perfused. Neurologic: CN 2-12 grossly intact. Sensation intact, DTR normal. Strength 5/5 in all 4 limbs.  Psychiatric: Normal judgment and insight. Alert and oriented x 3. Normal mood and affect.  Labs on Admission: I have personally reviewed following labs and imaging studies  CBC:  Recent Labs Lab 07/26/15 2249 07/27/15 1406 07/27/15 1611  WBC 9.9 23.0 Repeated and verified X2.* 19.0*  NEUTROABS  --  20.0* 15.9*  HGB 14.0 14.8 15.3  HCT 42.1 44.1 43.3  MCV 90.5 88.8 86.4  PLT 262 308.0 123456   Basic Metabolic Panel:  Recent Labs Lab 07/26/15 2249 07/27/15 1611  NA 138 136  K 4.1 3.9  CL 105 104  CO2 24 23  GLUCOSE 153* 108*  BUN 19 17  CREATININE 1.30* 1.10  CALCIUM 8.6* 9.2   GFR: Estimated Creatinine Clearance: 95.4 mL/min (by C-G formula based on Cr of 1.1). Liver Function Tests:  Recent Labs Lab 07/26/15 2249 07/27/15 1611  AST 29 27  ALT 31 30  ALKPHOS 53 54  BILITOT 0.5 1.0  PROT 6.9 7.5  ALBUMIN 4.1 4.5    Recent  Labs Lab 07/26/15 2249 07/27/15 1611  LIPASE 27 23   No results for input(s): AMMONIA in the last 168 hours. Coagulation Profile: No results for input(s): INR, PROTIME in the last 168 hours. Cardiac Enzymes:  Recent Labs Lab 07/26/15 2249 07/27/15 0228  TROPONINI <0.03 <0.03   BNP (last 3 results) No results for input(s): PROBNP in the last 8760 hours. HbA1C: No results for input(s): HGBA1C in the last 72 hours. CBG: No results for input(s): GLUCAP in the last 168 hours. Lipid Profile: No results for input(s): CHOL, HDL, LDLCALC, TRIG, CHOLHDL, LDLDIRECT in the last 72 hours. Thyroid Function Tests: No results for input(s): TSH, T4TOTAL, FREET4, T3FREE, THYROIDAB in the last 72 hours. Anemia Panel: No results for input(s): VITAMINB12, FOLATE, FERRITIN, TIBC, IRON, RETICCTPCT in the last 72 hours. Urine analysis:    Component Value Date/Time   COLORURINE YELLOW 07/27/2015 1750   APPEARANCEUR CLEAR 07/27/2015 1750   LABSPEC >1.046* 07/27/2015 1750   PHURINE 7.0 07/27/2015 1750   GLUCOSEU NEGATIVE 07/27/2015 1750   HGBUR MODERATE* 07/27/2015 1750   BILIRUBINUR NEGATIVE 07/27/2015 1750   KETONESUR NEGATIVE 07/27/2015 1750   PROTEINUR NEGATIVE 07/27/2015 1750   UROBILINOGEN 0.2 01/28/2014 2300   NITRITE NEGATIVE 07/27/2015 1750   LEUKOCYTESUR NEGATIVE 07/27/2015 1750   Sepsis Labs: @LABRCNTIP (procalcitonin:4,lacticidven:4) )No results found for this or any previous visit (from the past 240 hour(s)).   Radiological Exams on Admission: Ct Angio Chest Pe W/cm &/or Wo Cm  07/27/2015  CLINICAL DATA:  Acute onset of severe bilateral rib pain. Recent colonoscopy with polyp removal. Shortness of breath, nausea and diaphoresis. Initial encounter. EXAM: CT ANGIOGRAPHY CHEST CT ABDOMEN AND PELVIS WITH CONTRAST TECHNIQUE: Multidetector CT imaging of the chest was performed using the standard protocol during bolus administration of intravenous contrast. Multiplanar CT image  reconstructions and MIPs were obtained to evaluate the vascular anatomy. Multidetector CT imaging of the abdomen and pelvis was performed using the standard protocol during bolus administration of intravenous contrast. CONTRAST:  100 mL of Isovue 370 IV contrast COMPARISON:  CT of the abdomen and pelvis from 01/29/2014, and chest radiograph performed 07/26/2015 FINDINGS: CTA CHEST FINDINGS There is no evidence of pulmonary embolus. Minimal bilateral atelectasis is noted. The lungs are otherwise clear. There is no evidence of significant focal consolidation, pleural effusion or pneumothorax. No masses are identified; no abnormal focal contrast enhancement is seen. Mild coronary artery calcification is noted. No mediastinal lymphadenopathy is seen. No pericardial effusion is identified. The great vessels are grossly unremarkable in appearance. No axillary lymphadenopathy is seen. The visualized  portions of the thyroid gland are unremarkable in appearance. No acute osseous abnormalities are seen. CT ABDOMEN and PELVIS FINDINGS The liver and spleen are unremarkable in appearance. The gallbladder is within normal limits. The pancreas and adrenal glands are unremarkable. Scattered small bilateral renal cysts are seen. There is no evidence of hydronephrosis. No renal or ureteral stones are seen. No significant perinephric stranding is appreciated. No free fluid is identified. The small bowel is unremarkable in appearance. The stomach is within normal limits. No acute vascular abnormalities are seen. The appendix is normal in caliber, without evidence of appendicitis. The colon is unremarkable in appearance. The bladder is mildly distended and grossly unremarkable. A nonspecific 7.7 cm cystic structure with peripheral calcification is noted at the left upper hemipelvis. This is stable from 2016 and likely benign. The patient is status post prostatectomy. No inguinal lymphadenopathy is seen. No acute osseous abnormalities  are identified. Review of the MIP images confirms the above findings. IMPRESSION: 1. No evidence of pulmonary embolus. 2. Minimal bibasilar atelectasis noted.  Lungs otherwise clear. 3. Mild coronary artery calcifications seen. 4. Scattered small bilateral renal cysts seen. 5. Stable nonspecific 7.7 cm cystic structure with peripheral calcification at the left upper hemipelvis is likely benign. Electronically Signed   By: Garald Balding M.D.   On: 07/27/2015 01:58   Ct Abdomen Pelvis W Contrast  07/27/2015  CLINICAL DATA:  Acute onset of severe bilateral rib pain. Recent colonoscopy with polyp removal. Shortness of breath, nausea and diaphoresis. Initial encounter. EXAM: CT ANGIOGRAPHY CHEST CT ABDOMEN AND PELVIS WITH CONTRAST TECHNIQUE: Multidetector CT imaging of the chest was performed using the standard protocol during bolus administration of intravenous contrast. Multiplanar CT image reconstructions and MIPs were obtained to evaluate the vascular anatomy. Multidetector CT imaging of the abdomen and pelvis was performed using the standard protocol during bolus administration of intravenous contrast. CONTRAST:  100 mL of Isovue 370 IV contrast COMPARISON:  CT of the abdomen and pelvis from 01/29/2014, and chest radiograph performed 07/26/2015 FINDINGS: CTA CHEST FINDINGS There is no evidence of pulmonary embolus. Minimal bilateral atelectasis is noted. The lungs are otherwise clear. There is no evidence of significant focal consolidation, pleural effusion or pneumothorax. No masses are identified; no abnormal focal contrast enhancement is seen. Mild coronary artery calcification is noted. No mediastinal lymphadenopathy is seen. No pericardial effusion is identified. The great vessels are grossly unremarkable in appearance. No axillary lymphadenopathy is seen. The visualized portions of the thyroid gland are unremarkable in appearance. No acute osseous abnormalities are seen. CT ABDOMEN and PELVIS FINDINGS The  liver and spleen are unremarkable in appearance. The gallbladder is within normal limits. The pancreas and adrenal glands are unremarkable. Scattered small bilateral renal cysts are seen. There is no evidence of hydronephrosis. No renal or ureteral stones are seen. No significant perinephric stranding is appreciated. No free fluid is identified. The small bowel is unremarkable in appearance. The stomach is within normal limits. No acute vascular abnormalities are seen. The appendix is normal in caliber, without evidence of appendicitis. The colon is unremarkable in appearance. The bladder is mildly distended and grossly unremarkable. A nonspecific 7.7 cm cystic structure with peripheral calcification is noted at the left upper hemipelvis. This is stable from 2016 and likely benign. The patient is status post prostatectomy. No inguinal lymphadenopathy is seen. No acute osseous abnormalities are identified. Review of the MIP images confirms the above findings. IMPRESSION: 1. No evidence of pulmonary embolus. 2. Minimal bibasilar atelectasis noted.  Lungs otherwise clear. 3. Mild coronary artery calcifications seen. 4. Scattered small bilateral renal cysts seen. 5. Stable nonspecific 7.7 cm cystic structure with peripheral calcification at the left upper hemipelvis is likely benign. Electronically Signed   By: Garald Balding M.D.   On: 07/27/2015 01:58   Dg Abd 2 Views  07/27/2015  CLINICAL DATA:  Generalized abdominal pain. Status post colonoscopy 1 day prior. EXAM: ABDOMEN - 2 VIEW COMPARISON:  07/27/2015 CT abdomen/ pelvis. FINDINGS: No dilated small bowel loops. Oral contrast has progressed to the splenic flexure of the colon. No evidence of pneumatosis, pneumoperitoneum or pathologic soft tissue calcification. Moderate thoracolumbar spondylosis. IMPRESSION: Nonobstructive bowel gas pattern. Progression of oral contrast to the splenic flexure of the colon. No evidence of pneumatosis or pneumoperitoneum.  Electronically Signed   By: Ilona Sorrel M.D.   On: 07/27/2015 14:29   Dg Abd Acute W/chest  07/26/2015  CLINICAL DATA:  Severe upper abdominal pain in chest pain beginning 2 hours ago. Colonoscopy due date. EXAM: DG ABDOMEN ACUTE W/ 1V CHEST COMPARISON:  07/07/2015 FINDINGS: No evidence of ileus, obstruction or free air. The patient appears to have fecal matter in the colon, unusual given the history of colonoscopy due day. No abnormal calcifications are bony findings. One view chest shows normal heart and mediastinal shadows. The lungs are clear. No free air seen under the diaphragm. IMPRESSION: Negative abdominal radiographs.  No acute cardiopulmonary disease. Electronically Signed   By: Nelson Chimes M.D.   On: 07/26/2015 23:38   Ct Angio Chest/abd/pel For Dissection W And/or W/wo  07/27/2015  CLINICAL DATA:  55 year old male with severe lower abdominal and right flank pain one day status post colonoscopy. Chest pain and nausea and vomiting one night prior. Leukocytosis. EXAM: CT ANGIOGRAPHY CHEST, ABDOMEN AND PELVIS TECHNIQUE: Multidetector CT imaging through the chest, abdomen and pelvis was performed using the standard protocol during bolus administration of intravenous contrast. Multiplanar reconstructed images and MIPs were obtained and reviewed to evaluate the vascular anatomy. CONTRAST:  100 cc Isovue 370 IV. COMPARISON:  07/27/2015 CT chest, abdomen and pelvis (earlier today). FINDINGS: CTA CHEST FINDINGS Mediastinum/Nodes: Normal heart size. No significant pericardial fluid/thickening. Left anterior descending, left circumflex and right coronary atherosclerosis. Minimally atherosclerotic nonaneurysmal thoracic aorta. No acute intramural aortic hematoma. No aortic dissection, pseudoaneurysm or penetrating atherosclerotic ulcer. Aortic arch vessels are patent. Normal caliber pulmonary arteries. No central pulmonary emboli. No discrete thyroid nodules. Unremarkable esophagus. No pathologically enlarged  axillary, mediastinal or hilar lymph nodes. Lungs/Pleura: No pneumothorax. No pleural effusion. Mild hypoventilatory changes in the dependent lower lobes. No acute consolidative airspace disease, significant pulmonary nodules or lung masses. Musculoskeletal: No aggressive appearing focal osseous lesions. Mild-to-moderate thoracic spondylosis. Subcutaneous circumscribed 2.8 x 1.5 cm cystic structure in the medial left upper back (series 2/image 6), with mild wall thickening and surrounding mild fat stranding, favor a sebaceous cyst. Review of the MIP images confirms the above findings. CTA ABDOMEN AND PELVIS FINDINGS Hepatobiliary: Mild-to-moderate diffuse hepatic steatosis. No liver mass. No definite liver surface irregularity. Mild gallbladder distention. Suggestion of mild diffuse gallbladder wall thickening and pericholecystic fat stranding, new since this morning. No radiopaque cholelithiasis. No biliary ductal dilatation. Pancreas: Normal, with no mass or duct dilation. Spleen: Normal size. No mass. Adrenals/Urinary Tract: Normal adrenals. Subcentimeter hypodense renal cortical lesions in the mid to lower right kidney are too small to characterize. Otherwise normal kidneys, with no hydronephrosis. Normal bladder. Stomach/Bowel: Grossly normal stomach. Mild wall thickening in the descending duodenum with mild  associated fat stranding surrounding the descending duodenum, which appears new. Normal caliber small bowel with no small bowel wall thickening. Normal appendix. Normal large bowel with no diverticulosis, large bowel wall thickening or pericolonic fat stranding. No pneumatosis. Vascular/Lymphatic: Mildly atherosclerotic nonaneurysmal abdominal aorta. No abdominal aortic dissection, pseudoaneurysm or penetrating atherosclerotic ulcer. Celiac artery, superior mesenteric artery, splenic artery, common hepatic artery, inferior mesenteric artery and bilateral single appearing renal arteries appear patent.  Splenic and renal veins are patent. No pathologically enlarged lymph nodes in the abdomen or pelvis. Reproductive: Normal size prostate. Other: No pneumoperitoneum, ascites or focal fluid collection. Peripherally calcified circumscribed 4.4 x 4.2 cm cystic structure in the left anterior pelvis abutting the left external iliac vein (series 5/ image 167) is not appreciably changed in size since 09/18/2013, consistent with a benign extraperitoneal left pelvic cyst. Musculoskeletal: No aggressive appearing focal osseous lesions. Mild lumbar spondylosis. Review of the MIP images confirms the above findings. IMPRESSION: 1. No acute aortic syndrome.  Aortic atherosclerosis. 2. New gallbladder and descending duodenal wall thickening and new pericholecystic and peri-duodenal fat stranding. These findings are of uncertain etiology. Acute cholecystitis is a consideration. No radiopaque cholelithiasis. Acute infectious or inflammatory duodenitis or duodenal ulcer disease are considerations. Consider correlation with right upper quadrant abdominal sonogram. 3. No biliary ductal dilatation. 4. No pneumatosis or pneumoperitoneum.  No focal fluid collections. 5. Benign-appearing rim calcified extraperitoneal left anterior pelvic cyst is stable since 2015. 6. Three-vessel coronary atherosclerosis. 7. Mild-to-moderate diffuse hepatic steatosis. Electronically Signed   By: Ilona Sorrel M.D.   On: 07/27/2015 17:54   US Abdomen Limited Ruq  07/27/2015  CLINICAL DATA:  Right upper quadrant pain x1 day EXAM: US ABDOMEN LIMITED - RIGHT UPPER QUADRANT COMPARISON:  CT abdomen pelvis dated 07/27/2015 FINDINGS: Gallbladder: No gallstones, gallbladder wall thickening, or pericholecystic fluid. Common bile duct: Diameter: 3 mm Liver: Hyperechoic hepatic parenchyma, suggesting hepatic steatosis, with focal fatty sparing along the gallbladder fossa. IMPRESSION: Hepatic steatosis with focal fatty sparing. Electronically Signed   By: Julian Hy M.D.   On: 07/27/2015 18:57    EKG: Independently reviewed. Sinus rhythm, 1st degree AV block   Assessment/Plan  1. Intractable abdominal pain  - Etiology remains uncertain, unclear if related to colonoscopy on 07/26/15 - GI consulting and much appreciated  - CT abd/pelvis and RUQ [place] as above; LFT's wnl  - Covering with Zosyn given leukocytosis; was tachycardic and with temp 37.8 C in GI clinic just PTA  - Protonix 40 mg daily for associated duodenitis  - Keep NPO  - Pain-control with IV Dilaudid prn, convert to PO when appropriate  - Repeat CMP in am   2. Duodenitis  - Suggested by CT abd/pel from 07/27/15 - Protonix 40 mg IV daily  - Empiric Zosyn as above   3. Leukocytosis  - WBC 19,000 on admission  - Likely secondary to intraabdominal process with fat-stranding about the gallbladder and duodenum  - Covering with empiric Zosyn as above    4. Hypertension  - Elevated in ED with pain likely contributing  - Managed with lisinopril at home, holding now while NPO, resume when appropriate  - Treat prn with hydralazine IVP's for now   5. Hyperlipidemia  - Continue current management with Zocor once appropriate for PO   6. GERD - Stable, managed with Prilosec 20 mg daily at home  - Continue PPI therapy with Protonix 40 mg per GI recs    7. Anxiety  - Stable, managed with prn  Xanax at home  - Ativan available prn while NPO    8. Hx of prostate cancer  - Diagnosed in 2012 or 2013 and treated with robot-assisted resection  - Has been followed by urology with undetectable PSA's    DVT prophylaxis: sq Lovenox  Code Status: Full  Family Communication: Family updated at bedside  Disposition Plan: Observe on med-surg   Consults called: GI  Admission status: Observation     Vianne Bulls, MD [facility] Pager [phone]  If 7PM-7AM, please contact night-coverage [email or web address] Password [email or web address]  07/27/2015, 9:05 PM

## 2015-07-27 NOTE — Discharge Instructions (Signed)
Your tests, including CT scan, did not show any serious cause for ear pain. It may be related to your colonoscopy. You're being given a take-home pack of oxycodone-acetaminophen which she may take for pain as needed. If pain is persisting more than another 24 hours, you should be reevaluated.   Abdominal Pain, Adult Many things can cause abdominal pain. Usually, abdominal pain is not caused by a disease and will improve without treatment. It can often be observed and treated at home. Your health care provider will do a physical exam and possibly order blood tests and X-rays to help determine the seriousness of your pain. However, in many cases, more time must pass before a clear cause of the pain can be found. Before that point, your health care provider may not know if you need more testing or further treatment. HOME CARE INSTRUCTIONS Monitor your abdominal pain for any changes. The following actions may help to alleviate any discomfort you are experiencing:  Only take over-the-counter or prescription medicines as directed by your health care provider.  Do not take laxatives unless directed to do so by your health care provider.  Try a clear liquid diet (broth, tea, or water) as directed by your health care provider. Slowly move to a bland diet as tolerated. SEEK MEDICAL CARE IF:  You have unexplained abdominal pain.  You have abdominal pain associated with nausea or diarrhea.  You have pain when you urinate or have a bowel movement.  You experience abdominal pain that wakes you in the night.  You have abdominal pain that is worsened or improved by eating food.  You have abdominal pain that is worsened with eating fatty foods.  You have a fever. SEEK IMMEDIATE MEDICAL CARE IF:  Your pain does not go away within 2 hours.  You keep throwing up (vomiting).  Your pain is felt only in portions of the abdomen, such as the right side or the left lower portion of the abdomen.  You pass  bloody or black tarry stools. MAKE SURE YOU:  Understand these instructions.  Will watch your condition.  Will get help right away if you are not doing well or get worse.   This information is not intended to replace advice given to you by your health care provider. Make sure you discuss any questions you have with your health care provider.   Document Released: 10/16/2004 Document Revised: 09/27/2014 Document Reviewed: 09/15/2012 Elsevier Interactive Patient Education 2016 [employer]  Acetaminophen; Oxycodone tablets What is this medicine? ACETAMINOPHEN; OXYCODONE (a set a MEE noe fen; ox i KOE done) is a pain reliever. It is used to treat moderate to severe pain. This medicine may be used for other purposes; ask your health care provider or pharmacist if you have questions. What should I tell my health care provider before I take this medicine? They need to know if you have any of these conditions: -brain tumor -Crohn's disease, inflammatory bowel disease, or ulcerative colitis -drug abuse or addiction -head injury -heart or circulation problems -if you often drink alcohol -kidney disease or problems going to the bathroom -liver disease -lung disease, asthma, or breathing problems -an unusual or allergic reaction to acetaminophen, oxycodone, other opioid analgesics, other medicines, foods, dyes, or preservatives -pregnant or trying to get pregnant -breast-feeding How should I use this medicine? Take this medicine by mouth with a full glass of water. Follow the directions on the prescription label. You can take it with or without food. If it upsets your  stomach, take it with food. Take your medicine at regular intervals. Do not take it more often than directed. Talk to your pediatrician regarding the use of this medicine in children. Special care may be needed. Patients over 55 years old may have a stronger reaction and need a smaller dose. Overdosage: If you think you have  taken too much of this medicine contact a poison control center or emergency room at once. NOTE: This medicine is only for you. Do not share this medicine with others. What if I miss a dose? If you miss a dose, take it as soon as you can. If it is almost time for your next dose, take only that dose. Do not take double or extra doses. What may interact with this medicine? -alcohol -antihistamines -barbiturates like amobarbital, butalbital, butabarbital, methohexital, pentobarbital, phenobarbital, thiopental, and secobarbital -benztropine -drugs for bladder problems like solifenacin, trospium, oxybutynin, tolterodine, hyoscyamine, and methscopolamine -drugs for breathing problems like ipratropium and tiotropium -drugs for certain stomach or intestine problems like propantheline, homatropine methylbromide, glycopyrrolate, atropine, belladonna, and dicyclomine -general anesthetics like etomidate, ketamine, nitrous oxide, propofol, desflurane, enflurane, halothane, isoflurane, and sevoflurane -medicines for depression, anxiety, or psychotic disturbances -medicines for sleep -muscle relaxants -naltrexone -narcotic medicines (opiates) for pain -phenothiazines like perphenazine, thioridazine, chlorpromazine, mesoridazine, fluphenazine, prochlorperazine, promazine, and trifluoperazine -scopolamine -tramadol -trihexyphenidyl This list may not describe all possible interactions. Give your health care provider a list of all the medicines, herbs, non-prescription drugs, or dietary supplements you use. Also tell them if you smoke, drink alcohol, or use illegal drugs. Some items may interact with your medicine. What should I watch for while using this medicine? Tell your doctor or health care professional if your pain does not go away, if it gets worse, or if you have new or a different type of pain. You may develop tolerance to the medicine. Tolerance means that you will need a higher dose of the  medication for pain relief. Tolerance is normal and is expected if you take this medicine for a long time. Do not suddenly stop taking your medicine because you may develop a severe reaction. Your body becomes used to the medicine. This does NOT mean you are addicted. Addiction is a behavior related to getting and using a drug for a non-medical reason. If you have pain, you have a medical reason to take pain medicine. Your doctor will tell you how much medicine to take. If your doctor wants you to stop the medicine, the dose will be slowly lowered over time to avoid any side effects. You may get drowsy or dizzy. Do not drive, use machinery, or do anything that needs mental alertness until you know how this medicine affects you. Do not stand or sit up quickly, especially if you are an older patient. This reduces the risk of dizzy or fainting spells. Alcohol may interfere with the effect of this medicine. Avoid alcoholic drinks. There are different types of narcotic medicines (opiates) for pain. If you take more than one type at the same time, you may have more side effects. Give your health care provider a list of all medicines you use. Your doctor will tell you how much medicine to take. Do not take more medicine than directed. Call emergency for help if you have problems breathing. The medicine will cause constipation. Try to have a bowel movement at least every 2 to 3 days. If you do not have a bowel movement for 3 days, call your doctor or health care professional.  Do not take Tylenol (acetaminophen) or medicines that have acetaminophen with this medicine. Too much acetaminophen can be very dangerous. Many nonprescription medicines contain acetaminophen. Always read the labels carefully to avoid taking more acetaminophen. What side effects may I notice from receiving this medicine? Side effects that you should report to your doctor or health care professional as soon as possible: -allergic reactions like  skin rash, itching or hives, swelling of the face, lips, or tongue -breathing difficulties, wheezing -confusion -light headedness or fainting spells -severe stomach pain -unusually weak or tired -yellowing of the skin or the whites of the eyes Side effects that usually do not require medical attention (report to your doctor or health care professional if they continue or are bothersome): -dizziness -drowsiness -nausea -vomiting This list may not describe all possible side effects. Call your doctor for medical advice about side effects. You may report side effects to FDA at 1-800-FDA-1088. Where should I keep my medicine? Keep out of the reach of children. This medicine can be abused. Keep your medicine in a safe place to protect it from theft. Do not share this medicine with anyone. Selling or giving away this medicine is dangerous and against the law. This medicine may cause accidental overdose and death if it taken by other adults, children, or pets. Mix any unused medicine with a substance like cat litter or coffee grounds. Then throw the medicine away in a sealed container like a sealed bag or a coffee can with a lid. Do not use the medicine after the expiration date. Store at room temperature between 20 and 25 degrees C (68 and 77 degrees F). NOTE: This sheet is a summary. It may not cover all possible information. If you have questions about this medicine, talk to your doctor, pharmacist, or health care provider.    2016, Elsevier/Gold Standard. (2013-12-07 15:18:46)

## 2015-07-28 DIAGNOSIS — E785 Hyperlipidemia, unspecified: Secondary | ICD-10-CM | POA: Diagnosis present

## 2015-07-28 DIAGNOSIS — R109 Unspecified abdominal pain: Secondary | ICD-10-CM | POA: Diagnosis not present

## 2015-07-28 DIAGNOSIS — R1011 Right upper quadrant pain: Secondary | ICD-10-CM | POA: Diagnosis present

## 2015-07-28 DIAGNOSIS — Z8546 Personal history of malignant neoplasm of prostate: Secondary | ICD-10-CM | POA: Diagnosis not present

## 2015-07-28 DIAGNOSIS — Z85828 Personal history of other malignant neoplasm of skin: Secondary | ICD-10-CM | POA: Diagnosis not present

## 2015-07-28 DIAGNOSIS — K2980 Duodenitis without bleeding: Secondary | ICD-10-CM | POA: Diagnosis present

## 2015-07-28 DIAGNOSIS — Z9079 Acquired absence of other genital organ(s): Secondary | ICD-10-CM | POA: Diagnosis not present

## 2015-07-28 DIAGNOSIS — K219 Gastro-esophageal reflux disease without esophagitis: Secondary | ICD-10-CM | POA: Diagnosis present

## 2015-07-28 DIAGNOSIS — F419 Anxiety disorder, unspecified: Secondary | ICD-10-CM | POA: Diagnosis present

## 2015-07-28 DIAGNOSIS — I10 Essential (primary) hypertension: Secondary | ICD-10-CM | POA: Diagnosis present

## 2015-07-28 LAB — CBC
HCT: 41.4 % (ref 39.0–52.0)
Hemoglobin: 13.5 g/dL (ref 13.0–17.0)
MCH: 30.1 pg (ref 26.0–34.0)
MCHC: 32.6 g/dL (ref 30.0–36.0)
MCV: 92.2 fL (ref 78.0–100.0)
PLATELETS: 233 10*3/uL (ref 150–400)
RBC: 4.49 MIL/uL (ref 4.22–5.81)
RDW: 13.4 % (ref 11.5–15.5)
WBC: 17.2 10*3/uL — ABNORMAL HIGH (ref 4.0–10.5)

## 2015-07-28 LAB — COMPREHENSIVE METABOLIC PANEL
ALBUMIN: 3.8 g/dL (ref 3.5–5.0)
ALK PHOS: 47 U/L (ref 38–126)
ALT: 27 U/L (ref 17–63)
AST: 24 U/L (ref 15–41)
Anion gap: 7 (ref 5–15)
BUN: 16 mg/dL (ref 6–20)
CHLORIDE: 104 mmol/L (ref 101–111)
CO2: 22 mmol/L (ref 22–32)
CREATININE: 1.02 mg/dL (ref 0.61–1.24)
Calcium: 8.2 mg/dL — ABNORMAL LOW (ref 8.9–10.3)
GFR calc non Af Amer: >60 mL/min (ref >60)
GLUCOSE: 119 mg/dL — AB (ref 65–99)
Potassium: 4.7 mmol/L (ref 3.5–5.1)
SODIUM: 133 mmol/L — AB (ref 135–145)
Total Bilirubin: 1.2 mg/dL (ref 0.3–1.2)
Total Protein: 6.6 g/dL (ref 6.5–8.1)

## 2015-07-28 MED ORDER — PANTOPRAZOLE SODIUM 40 MG IV SOLR
40.0000 mg | Freq: Two times a day (BID) | INTRAVENOUS | Status: DC
  Administered 2015-07-28 – 2015-07-30 (×4): 40 mg via INTRAVENOUS
  Filled 2015-07-28 (×4): qty 40

## 2015-07-28 MED ORDER — ACETAMINOPHEN 325 MG PO TABS: 650.0000 mg | ORAL_TABLET | Freq: Four times a day (QID) | ORAL | Status: DC | PRN

## 2015-07-28 NOTE — Progress Notes (Signed)
Progress Note   Subjective   Feels "bad"  Fever to 101, pain 7/10 -best if lies still and doesn't move- no vomiting, wants something to drink  [place] - no GB wall thickening /fluid or stones   Objective   Vital signs in last 24 hours: Temp:  [98.2 F (36.8 C)-101 F (38.3 C)] 100.3 F (37.9 C) (07/08 1017) Pulse Rate:  [85-120] 99 (07/08 0635) Resp:  [18-20] 20 (07/08 0635) BP: (110-140)/(60-82) 137/64 mmHg (07/08 0635) SpO2:  [98 %-100 %] 98 % (07/08 0635) Weight:  [243 lb (110.224 kg)-245 lb 8 oz (111.358 kg)] 245 lb 8 oz (111.358 kg) (07/07 2125)   General:    white male in NAD- uncomfortable , flushed Heart:  Mild tachyRegular rate and rhythm; no murmurs Lungs: Respirations even and unlabored, lungs CTA bilaterally Abdomen:  Soft, tender  Rather diffuselyand nondistended. BS present Extremities:  Without edema. Neurologic:  Alert and oriented,  grossly normal neurologically. Psych:  Cooperative. Normal mood and affect.  Intake/Output from previous day: 07/07 0701 - 07/08 0700 In: -  Out: 50 [Urine:50] Intake/Output this shift:    Lab Results:  Recent Labs  07/26/15 2249 07/27/15 1406 07/27/15 1611  WBC 9.9 23.0 Repeated and verified X2.* 19.0*  HGB 14.0 14.8 15.3  HCT 42.1 44.1 43.3  PLT 262 308.0 266   BMET  Recent Labs  07/26/15 2249 07/27/15 1611 07/28/15 0317  NA 138 136 133*  K 4.1 3.9 4.7  CL 105 104 104  CO2 24 23 22   GLUCOSE 153* 108* 119*  BUN 19 17 16   CREATININE 1.30* 1.10 1.02  CALCIUM 8.6* 9.2 8.2*   LFT  Recent Labs  07/26/15 2249  07/28/15 0317  PROT 6.9  < > 6.6  ALBUMIN 4.1  < > 3.8  AST 29  < > 24  ALT 31  < > 27  ALKPHOS 53  < > 47  BILITOT 0.5  < > 1.2  BILIDIR 0.1  --   --   IBILI 0.4  --   --   < > = values in this interval not displayed. PT/INR No results for input(s): LABPROT, INR in the last 72 hours.  Studies/Results: Ct Angio Chest Pe W/cm &/or Wo Cm  07/27/2015  CLINICAL DATA:  Acute onset of severe  bilateral rib pain. Recent colonoscopy with polyp removal. Shortness of breath, nausea and diaphoresis. Initial encounter. EXAM: CT ANGIOGRAPHY CHEST CT ABDOMEN AND PELVIS WITH CONTRAST TECHNIQUE: Multidetector CT imaging of the chest was performed using the standard protocol during bolus administration of intravenous contrast. Multiplanar CT image reconstructions and MIPs were obtained to evaluate the vascular anatomy. Multidetector CT imaging of the abdomen and pelvis was performed using the standard protocol during bolus administration of intravenous contrast. CONTRAST:  100 mL of Isovue 370 IV contrast COMPARISON:  CT of the abdomen and pelvis from 01/29/2014, and chest radiograph performed 07/26/2015 FINDINGS: CTA CHEST FINDINGS There is no evidence of pulmonary embolus. Minimal bilateral atelectasis is noted. The lungs are otherwise clear. There is no evidence of significant focal consolidation, pleural effusion or pneumothorax. No masses are identified; no abnormal focal contrast enhancement is seen. Mild coronary artery calcification is noted. No mediastinal lymphadenopathy is seen. No pericardial effusion is identified. The great vessels are grossly unremarkable in appearance. No axillary lymphadenopathy is seen. The visualized portions of the thyroid gland are unremarkable in appearance. No acute osseous abnormalities are seen. CT ABDOMEN and PELVIS FINDINGS The liver and spleen  are unremarkable in appearance. The gallbladder is within normal limits. The pancreas and adrenal glands are unremarkable. Scattered small bilateral renal cysts are seen. There is no evidence of hydronephrosis. No renal or ureteral stones are seen. No significant perinephric stranding is appreciated. No free fluid is identified. The small bowel is unremarkable in appearance. The stomach is within normal limits. No acute vascular abnormalities are seen. The appendix is normal in caliber, without evidence of appendicitis. The colon  is unremarkable in appearance. The bladder is mildly distended and grossly unremarkable. A nonspecific 7.7 cm cystic structure with peripheral calcification is noted at the left upper hemipelvis. This is stable from 2016 and likely benign. The patient is status post prostatectomy. No inguinal lymphadenopathy is seen. No acute osseous abnormalities are identified. Review of the MIP images confirms the above findings. IMPRESSION: 1. No evidence of pulmonary embolus. 2. Minimal bibasilar atelectasis noted.  Lungs otherwise clear. 3. Mild coronary artery calcifications seen. 4. Scattered small bilateral renal cysts seen. 5. Stable nonspecific 7.7 cm cystic structure with peripheral calcification at the left upper hemipelvis is likely benign. Electronically Signed   By: Garald Balding M.D.   On: 07/27/2015 01:58   Ct Abdomen Pelvis W Contrast  07/27/2015  CLINICAL DATA:  Acute onset of severe bilateral rib pain. Recent colonoscopy with polyp removal. Shortness of breath, nausea and diaphoresis. Initial encounter. EXAM: CT ANGIOGRAPHY CHEST CT ABDOMEN AND PELVIS WITH CONTRAST TECHNIQUE: Multidetector CT imaging of the chest was performed using the standard protocol during bolus administration of intravenous contrast. Multiplanar CT image reconstructions and MIPs were obtained to evaluate the vascular anatomy. Multidetector CT imaging of the abdomen and pelvis was performed using the standard protocol during bolus administration of intravenous contrast. CONTRAST:  100 mL of Isovue 370 IV contrast COMPARISON:  CT of the abdomen and pelvis from 01/29/2014, and chest radiograph performed 07/26/2015 FINDINGS: CTA CHEST FINDINGS There is no evidence of pulmonary embolus. Minimal bilateral atelectasis is noted. The lungs are otherwise clear. There is no evidence of significant focal consolidation, pleural effusion or pneumothorax. No masses are identified; no abnormal focal contrast enhancement is seen. Mild coronary artery  calcification is noted. No mediastinal lymphadenopathy is seen. No pericardial effusion is identified. The great vessels are grossly unremarkable in appearance. No axillary lymphadenopathy is seen. The visualized portions of the thyroid gland are unremarkable in appearance. No acute osseous abnormalities are seen. CT ABDOMEN and PELVIS FINDINGS The liver and spleen are unremarkable in appearance. The gallbladder is within normal limits. The pancreas and adrenal glands are unremarkable. Scattered small bilateral renal cysts are seen. There is no evidence of hydronephrosis. No renal or ureteral stones are seen. No significant perinephric stranding is appreciated. No free fluid is identified. The small bowel is unremarkable in appearance. The stomach is within normal limits. No acute vascular abnormalities are seen. The appendix is normal in caliber, without evidence of appendicitis. The colon is unremarkable in appearance. The bladder is mildly distended and grossly unremarkable. A nonspecific 7.7 cm cystic structure with peripheral calcification is noted at the left upper hemipelvis. This is stable from 2016 and likely benign. The patient is status post prostatectomy. No inguinal lymphadenopathy is seen. No acute osseous abnormalities are identified. Review of the MIP images confirms the above findings. IMPRESSION: 1. No evidence of pulmonary embolus. 2. Minimal bibasilar atelectasis noted.  Lungs otherwise clear. 3. Mild coronary artery calcifications seen. 4. Scattered small bilateral renal cysts seen. 5. Stable nonspecific 7.7 cm cystic structure with  peripheral calcification at the left upper hemipelvis is likely benign. Electronically Signed   By: Garald Balding M.D.   On: 07/27/2015 01:58   Dg Abd 2 Views  07/27/2015  CLINICAL DATA:  Generalized abdominal pain. Status post colonoscopy 1 day prior. EXAM: ABDOMEN - 2 VIEW COMPARISON:  07/27/2015 CT abdomen/ pelvis. FINDINGS: No dilated small bowel loops. Oral  contrast has progressed to the splenic flexure of the colon. No evidence of pneumatosis, pneumoperitoneum or pathologic soft tissue calcification. Moderate thoracolumbar spondylosis. IMPRESSION: Nonobstructive bowel gas pattern. Progression of oral contrast to the splenic flexure of the colon. No evidence of pneumatosis or pneumoperitoneum. Electronically Signed   By: Ilona Sorrel M.D.   On: 07/27/2015 14:29   Dg Abd Acute W/chest  07/26/2015  CLINICAL DATA:  Severe upper abdominal pain in chest pain beginning 2 hours ago. Colonoscopy due date. EXAM: DG ABDOMEN ACUTE W/ 1V CHEST COMPARISON:  07/07/2015 FINDINGS: No evidence of ileus, obstruction or free air. The patient appears to have fecal matter in the colon, unusual given the history of colonoscopy due day. No abnormal calcifications are bony findings. One view chest shows normal heart and mediastinal shadows. The lungs are clear. No free air seen under the diaphragm. IMPRESSION: Negative abdominal radiographs.  No acute cardiopulmonary disease. Electronically Signed   By: Nelson Chimes M.D.   On: 07/26/2015 23:38   Ct Angio Chest/abd/pel For Dissection W And/or W/wo  07/27/2015  CLINICAL DATA:  55 year old male with severe lower abdominal and right flank pain one day status post colonoscopy. Chest pain and nausea and vomiting one night prior. Leukocytosis. EXAM: CT ANGIOGRAPHY CHEST, ABDOMEN AND PELVIS TECHNIQUE: Multidetector CT imaging through the chest, abdomen and pelvis was performed using the standard protocol during bolus administration of intravenous contrast. Multiplanar reconstructed images and MIPs were obtained and reviewed to evaluate the vascular anatomy. CONTRAST:  100 cc Isovue 370 IV. COMPARISON:  07/27/2015 CT chest, abdomen and pelvis (earlier today). FINDINGS: CTA CHEST FINDINGS Mediastinum/Nodes: Normal heart size. No significant pericardial fluid/thickening. Left anterior descending, left circumflex and right coronary atherosclerosis.  Minimally atherosclerotic nonaneurysmal thoracic aorta. No acute intramural aortic hematoma. No aortic dissection, pseudoaneurysm or penetrating atherosclerotic ulcer. Aortic arch vessels are patent. Normal caliber pulmonary arteries. No central pulmonary emboli. No discrete thyroid nodules. Unremarkable esophagus. No pathologically enlarged axillary, mediastinal or hilar lymph nodes. Lungs/Pleura: No pneumothorax. No pleural effusion. Mild hypoventilatory changes in the dependent lower lobes. No acute consolidative airspace disease, significant pulmonary nodules or lung masses. Musculoskeletal: No aggressive appearing focal osseous lesions. Mild-to-moderate thoracic spondylosis. Subcutaneous circumscribed 2.8 x 1.5 cm cystic structure in the medial left upper back (series 2/image 6), with mild wall thickening and surrounding mild fat stranding, favor a sebaceous cyst. Review of the MIP images confirms the above findings. CTA ABDOMEN AND PELVIS FINDINGS Hepatobiliary: Mild-to-moderate diffuse hepatic steatosis. No liver mass. No definite liver surface irregularity. Mild gallbladder distention. Suggestion of mild diffuse gallbladder wall thickening and pericholecystic fat stranding, new since this morning. No radiopaque cholelithiasis. No biliary ductal dilatation. Pancreas: Normal, with no mass or duct dilation. Spleen: Normal size. No mass. Adrenals/Urinary Tract: Normal adrenals. Subcentimeter hypodense renal cortical lesions in the mid to lower right kidney are too small to characterize. Otherwise normal kidneys, with no hydronephrosis. Normal bladder. Stomach/Bowel: Grossly normal stomach. Mild wall thickening in the descending duodenum with mild associated fat stranding surrounding the descending duodenum, which appears new. Normal caliber small bowel with no small bowel wall thickening. Normal appendix. Normal large  bowel with no diverticulosis, large bowel wall thickening or pericolonic fat stranding. No  pneumatosis. Vascular/Lymphatic: Mildly atherosclerotic nonaneurysmal abdominal aorta. No abdominal aortic dissection, pseudoaneurysm or penetrating atherosclerotic ulcer. Celiac artery, superior mesenteric artery, splenic artery, common hepatic artery, inferior mesenteric artery and bilateral single appearing renal arteries appear patent. Splenic and renal veins are patent. No pathologically enlarged lymph nodes in the abdomen or pelvis. Reproductive: Normal size prostate. Other: No pneumoperitoneum, ascites or focal fluid collection. Peripherally calcified circumscribed 4.4 x 4.2 cm cystic structure in the left anterior pelvis abutting the left external iliac vein (series 5/ image 167) is not appreciably changed in size since 09/18/2013, consistent with a benign extraperitoneal left pelvic cyst. Musculoskeletal: No aggressive appearing focal osseous lesions. Mild lumbar spondylosis. Review of the MIP images confirms the above findings. IMPRESSION: 1. No acute aortic syndrome.  Aortic atherosclerosis. 2. New gallbladder and descending duodenal wall thickening and new pericholecystic and peri-duodenal fat stranding. These findings are of uncertain etiology. Acute cholecystitis is a consideration. No radiopaque cholelithiasis. Acute infectious or inflammatory duodenitis or duodenal ulcer disease are considerations. Consider correlation with right upper quadrant abdominal sonogram. 3. No biliary ductal dilatation. 4. No pneumatosis or pneumoperitoneum.  No focal fluid collections. 5. Benign-appearing rim calcified extraperitoneal left anterior pelvic cyst is stable since 2015. 6. Three-vessel coronary atherosclerosis. 7. Mild-to-moderate diffuse hepatic steatosis. Electronically Signed   By: Ilona Sorrel M.D.   On: 07/27/2015 17:54   US Abdomen Limited Ruq  07/27/2015  CLINICAL DATA:  Right upper quadrant pain x1 day EXAM: US ABDOMEN LIMITED - RIGHT UPPER QUADRANT COMPARISON:  CT abdomen pelvis dated 07/27/2015  FINDINGS: Gallbladder: No gallstones, gallbladder wall thickening, or pericholecystic fluid. Common bile duct: Diameter: 3 mm Liver: Hyperechoic hepatic parenchyma, suggesting hepatic steatosis, with focal fatty sparing along the gallbladder fossa. IMPRESSION: Hepatic steatosis with focal fatty sparing. Electronically Signed   By: Julian Hy M.D.   On: 07/27/2015 18:57       Assessment / Plan:    #1 55 yo male with acute upper abdominal  pain ,nause/vomiting onset evening of Colonoscopy with polypectomies of very small polyps which was done 7/6- pain became more diffuse /constant Extensive imaging workup thus far showing no evidence for bowel perforation or direct complication from colonoscopy CT does show duodenitis with surrounding stranding-? etiology Febrile today on Zosyn   Plan; Sips clears Continue IV Zosyn  BC pending Check CBC  IV PPI BID Pain control  Principal Problem:   Intractable abdominal pain Active Problems:   GERD (gastroesophageal reflux disease)   Hyperlipidemia   Prostate cancer (HCC)   HTN (hypertension)   Leukocytosis   Anxiety   Duodenitis   Diffuse abdominal pain       Amy Esterwood  07/28/2015, 10:39 AM

## 2015-07-28 NOTE — Progress Notes (Signed)
PROGRESS NOTE    David Hartman  [id number] DOB: [birth year] DOA: 07/27/2015 PCP: Jeri Modena   Brief Narrative:  55 y.o. male with medical history significant for prostate cancer status post robotic surgery, hypertension, hyperlipidemia, GERD, and anxiety who presents the emergency department at the direction of his gastroenterologist for evaluation of severe abdominal pain with tachycardia and temperature of 37.8 C. Patient underwent routine screening colonoscopy yesterday with removal of 2 small polyps from the ascending colon and 2 small polyps from the transverse colon with cold snare.He was discharged home with analgesics and followed up today in the gastroenterology clinic. At [facility], his pain shifted from the chest down to the abdomen and she has experienced persistent severe abdominal pain throughout the day, seemingly unaffected by his opiate analgesic. Pain is described as sharp, severe, localized to the bilateral flanks and lower abdomen, worse with deep inspiration or movement, and with no alleviating factors identified.    Assessment & Plan:   Principal Problem:   Intractable abdominal pain - We'll continue to provide supportive therapy. Most likely secondary to duodenitis  Duodenitis -Plan will be to continue current IV antibiotics. Once patient is afebrile we'll plan on the escalating antibiotic regimen  Fever -Secondary to duodenitis will add acetaminophen  Active Problems:   GERD (gastroesophageal reflux disease) -Patient currently on Protonix, stable    Hyperlipidemia - Stable    Prostate cancer ([place])   HTN (hypertension) - Stable currently    Leukocytosis -Secondary to duodenitis. Continue antibiotic regimen and monitor next a.m.   DVT prophylaxis: lovenox Code Status: full Family Communication: None at bedside Disposition Plan: Pending continued improvement in condition   Consultants:   GI following   Procedures:  None   Antimicrobials: Zosyn   Subjective: Patient has no new complaints. No acute issues overnight  Objective: Filed Vitals:   07/27/15 1751 07/27/15 2125 07/28/15 0635 07/28/15 1017  BP: 134/82 131/67 137/64   Pulse: 91 85 99   Temp: 98.2 F (36.8 C) 99.4 F (37.4 C) 101 F (38.3 C) 100.3 F (37.9 C)  TempSrc: Oral Oral Oral Oral  Resp: 18 20 20    Height:  5\' 10"  (1.778 m)    Weight:  111.358 kg (245 lb 8 oz)    SpO2: 98% 99% 98%     Intake/Output Summary (Last 24 hours) at 07/28/15 1244 Last data filed at 07/28/15 0100  Gross per 24 hour  Intake      0 ml  Output     50 ml  Net    -50 ml   Filed Weights   07/27/15 2125  Weight: 111.358 kg (245 lb 8 oz)    Examination:  General exam: awake and alert, in nad.  Respiratory system: Clear to auscultation. Respiratory effort normal. Cardiovascular system: S1 & S2 heard, RRR. No JVD, murmurs, rubs, gallops or clicks. No pedal edema. Gastrointestinal system: Abdomen is nondistended, soft and tendernes at upper quadrant, no rebound tenderness, + bowel sounds.  Central nervous system: Alert and oriented. No focal neurological deficits. Extremities: Symmetric 5 x 5 power. Skin: No rashes, lesions or ulcers Psychiatry: Judgement and insight appear normal. Mood & affect appropriate.   Data Reviewed: I have personally reviewed following labs and imaging studies  CBC:  Recent Labs Lab 07/26/15 2249 07/27/15 1406 07/27/15 1611 07/28/15 0316  WBC 9.9 23.0 Repeated and verified X2.* 19.0* 17.2*  NEUTROABS  --  20.0* 15.9*  --   HGB 14.0 14.8 15.3 13.5  HCT 42.1  44.1 43.3 41.4  MCV 90.5 88.8 86.4 92.2  PLT 262 308.0 266 0000000   Basic Metabolic Panel:  Recent Labs Lab 07/26/15 2249 07/27/15 1611 07/28/15 0317  NA 138 136 133*  K 4.1 3.9 4.7  CL 105 104 104  CO2 24 23 22   GLUCOSE 153* 108* 119*  BUN 19 17 16   CREATININE 1.30* 1.10 1.02  CALCIUM 8.6* 9.2 8.2*   GFR: Estimated Creatinine Clearance: 103.5  mL/min (by C-G formula based on Cr of 1.02). Liver Function Tests:  Recent Labs Lab 07/26/15 2249 07/27/15 1611 07/28/15 0317  AST 29 27 24   ALT 31 30 27   ALKPHOS 53 54 47  BILITOT 0.5 1.0 1.2  PROT 6.9 7.5 6.6  ALBUMIN 4.1 4.5 3.8    Recent Labs Lab 07/26/15 2249 07/27/15 1611  LIPASE 27 23   No results for input(s): AMMONIA in the last 168 hours. Coagulation Profile: No results for input(s): INR, PROTIME in the last 168 hours. Cardiac Enzymes:  Recent Labs Lab 07/26/15 2249 07/27/15 0228  TROPONINI <0.03 <0.03   BNP (last 3 results) No results for input(s): PROBNP in the last 8760 hours. HbA1C: No results for input(s): HGBA1C in the last 72 hours. CBG: No results for input(s): GLUCAP in the last 168 hours. Lipid Profile: No results for input(s): CHOL, HDL, LDLCALC, TRIG, CHOLHDL, LDLDIRECT in the last 72 hours. Thyroid Function Tests: No results for input(s): TSH, T4TOTAL, FREET4, T3FREE, THYROIDAB in the last 72 hours. Anemia Panel: No results for input(s): VITAMINB12, FOLATE, FERRITIN, TIBC, IRON, RETICCTPCT in the last 72 hours. Sepsis Labs:  Recent Labs Lab 07/27/15 1620 07/27/15 2107  PROCALCITON  --  0.16  LATICACIDVEN 1.78  --     No results found for this or any previous visit (from the past 240 hour(s)).       Radiology Studies: Ct Angio Chest Pe W/cm &/or Wo Cm  07/27/2015  CLINICAL DATA:  Acute onset of severe bilateral rib pain. Recent colonoscopy with polyp removal. Shortness of breath, nausea and diaphoresis. Initial encounter. EXAM: CT ANGIOGRAPHY CHEST CT ABDOMEN AND PELVIS WITH CONTRAST TECHNIQUE: Multidetector CT imaging of the chest was performed using the standard protocol during bolus administration of intravenous contrast. Multiplanar CT image reconstructions and MIPs were obtained to evaluate the vascular anatomy. Multidetector CT imaging of the abdomen and pelvis was performed using the standard protocol during bolus  administration of intravenous contrast. CONTRAST:  100 mL of Isovue 370 IV contrast COMPARISON:  CT of the abdomen and pelvis from 01/29/2014, and chest radiograph performed 07/26/2015 FINDINGS: CTA CHEST FINDINGS There is no evidence of pulmonary embolus. Minimal bilateral atelectasis is noted. The lungs are otherwise clear. There is no evidence of significant focal consolidation, pleural effusion or pneumothorax. No masses are identified; no abnormal focal contrast enhancement is seen. Mild coronary artery calcification is noted. No mediastinal lymphadenopathy is seen. No pericardial effusion is identified. The great vessels are grossly unremarkable in appearance. No axillary lymphadenopathy is seen. The visualized portions of the thyroid gland are unremarkable in appearance. No acute osseous abnormalities are seen. CT ABDOMEN and PELVIS FINDINGS The liver and spleen are unremarkable in appearance. The gallbladder is within normal limits. The pancreas and adrenal glands are unremarkable. Scattered small bilateral renal cysts are seen. There is no evidence of hydronephrosis. No renal or ureteral stones are seen. No significant perinephric stranding is appreciated. No free fluid is identified. The small bowel is unremarkable in appearance. The stomach is within normal limits.  No acute vascular abnormalities are seen. The appendix is normal in caliber, without evidence of appendicitis. The colon is unremarkable in appearance. The bladder is mildly distended and grossly unremarkable. A nonspecific 7.7 cm cystic structure with peripheral calcification is noted at the left upper hemipelvis. This is stable from 2016 and likely benign. The patient is status post prostatectomy. No inguinal lymphadenopathy is seen. No acute osseous abnormalities are identified. Review of the MIP images confirms the above findings. IMPRESSION: 1. No evidence of pulmonary embolus. 2. Minimal bibasilar atelectasis noted.  Lungs otherwise  clear. 3. Mild coronary artery calcifications seen. 4. Scattered small bilateral renal cysts seen. 5. Stable nonspecific 7.7 cm cystic structure with peripheral calcification at the left upper hemipelvis is likely benign. Electronically Signed   By: Garald Balding M.D.   On: 07/27/2015 01:58   Ct Abdomen Pelvis W Contrast  07/27/2015  CLINICAL DATA:  Acute onset of severe bilateral rib pain. Recent colonoscopy with polyp removal. Shortness of breath, nausea and diaphoresis. Initial encounter. EXAM: CT ANGIOGRAPHY CHEST CT ABDOMEN AND PELVIS WITH CONTRAST TECHNIQUE: Multidetector CT imaging of the chest was performed using the standard protocol during bolus administration of intravenous contrast. Multiplanar CT image reconstructions and MIPs were obtained to evaluate the vascular anatomy. Multidetector CT imaging of the abdomen and pelvis was performed using the standard protocol during bolus administration of intravenous contrast. CONTRAST:  100 mL of Isovue 370 IV contrast COMPARISON:  CT of the abdomen and pelvis from 01/29/2014, and chest radiograph performed 07/26/2015 FINDINGS: CTA CHEST FINDINGS There is no evidence of pulmonary embolus. Minimal bilateral atelectasis is noted. The lungs are otherwise clear. There is no evidence of significant focal consolidation, pleural effusion or pneumothorax. No masses are identified; no abnormal focal contrast enhancement is seen. Mild coronary artery calcification is noted. No mediastinal lymphadenopathy is seen. No pericardial effusion is identified. The great vessels are grossly unremarkable in appearance. No axillary lymphadenopathy is seen. The visualized portions of the thyroid gland are unremarkable in appearance. No acute osseous abnormalities are seen. CT ABDOMEN and PELVIS FINDINGS The liver and spleen are unremarkable in appearance. The gallbladder is within normal limits. The pancreas and adrenal glands are unremarkable. Scattered small bilateral renal cysts  are seen. There is no evidence of hydronephrosis. No renal or ureteral stones are seen. No significant perinephric stranding is appreciated. No free fluid is identified. The small bowel is unremarkable in appearance. The stomach is within normal limits. No acute vascular abnormalities are seen. The appendix is normal in caliber, without evidence of appendicitis. The colon is unremarkable in appearance. The bladder is mildly distended and grossly unremarkable. A nonspecific 7.7 cm cystic structure with peripheral calcification is noted at the left upper hemipelvis. This is stable from 2016 and likely benign. The patient is status post prostatectomy. No inguinal lymphadenopathy is seen. No acute osseous abnormalities are identified. Review of the MIP images confirms the above findings. IMPRESSION: 1. No evidence of pulmonary embolus. 2. Minimal bibasilar atelectasis noted.  Lungs otherwise clear. 3. Mild coronary artery calcifications seen. 4. Scattered small bilateral renal cysts seen. 5. Stable nonspecific 7.7 cm cystic structure with peripheral calcification at the left upper hemipelvis is likely benign. Electronically Signed   By: Garald Balding M.D.   On: 07/27/2015 01:58   Dg Abd 2 Views  07/27/2015  CLINICAL DATA:  Generalized abdominal pain. Status post colonoscopy 1 day prior. EXAM: ABDOMEN - 2 VIEW COMPARISON:  07/27/2015 CT abdomen/ pelvis. FINDINGS: No dilated small bowel  loops. Oral contrast has progressed to the splenic flexure of the colon. No evidence of pneumatosis, pneumoperitoneum or pathologic soft tissue calcification. Moderate thoracolumbar spondylosis. IMPRESSION: Nonobstructive bowel gas pattern. Progression of oral contrast to the splenic flexure of the colon. No evidence of pneumatosis or pneumoperitoneum. Electronically Signed   By: Ilona Sorrel M.D.   On: 07/27/2015 14:29   Dg Abd Acute W/chest  07/26/2015  CLINICAL DATA:  Severe upper abdominal pain in chest pain beginning 2 hours ago.  Colonoscopy due date. EXAM: DG ABDOMEN ACUTE W/ 1V CHEST COMPARISON:  07/07/2015 FINDINGS: No evidence of ileus, obstruction or free air. The patient appears to have fecal matter in the colon, unusual given the history of colonoscopy due day. No abnormal calcifications are bony findings. One view chest shows normal heart and mediastinal shadows. The lungs are clear. No free air seen under the diaphragm. IMPRESSION: Negative abdominal radiographs.  No acute cardiopulmonary disease. Electronically Signed   By: Nelson Chimes M.D.   On: 07/26/2015 23:38   Ct Angio Chest/abd/pel For Dissection W And/or W/wo  07/27/2015  CLINICAL DATA:  55 year old male with severe lower abdominal and right flank pain one day status post colonoscopy. Chest pain and nausea and vomiting one night prior. Leukocytosis. EXAM: CT ANGIOGRAPHY CHEST, ABDOMEN AND PELVIS TECHNIQUE: Multidetector CT imaging through the chest, abdomen and pelvis was performed using the standard protocol during bolus administration of intravenous contrast. Multiplanar reconstructed images and MIPs were obtained and reviewed to evaluate the vascular anatomy. CONTRAST:  100 cc Isovue 370 IV. COMPARISON:  07/27/2015 CT chest, abdomen and pelvis (earlier today). FINDINGS: CTA CHEST FINDINGS Mediastinum/Nodes: Normal heart size. No significant pericardial fluid/thickening. Left anterior descending, left circumflex and right coronary atherosclerosis. Minimally atherosclerotic nonaneurysmal thoracic aorta. No acute intramural aortic hematoma. No aortic dissection, pseudoaneurysm or penetrating atherosclerotic ulcer. Aortic arch vessels are patent. Normal caliber pulmonary arteries. No central pulmonary emboli. No discrete thyroid nodules. Unremarkable esophagus. No pathologically enlarged axillary, mediastinal or hilar lymph nodes. Lungs/Pleura: No pneumothorax. No pleural effusion. Mild hypoventilatory changes in the dependent lower lobes. No acute consolidative airspace  disease, significant pulmonary nodules or lung masses. Musculoskeletal: No aggressive appearing focal osseous lesions. Mild-to-moderate thoracic spondylosis. Subcutaneous circumscribed 2.8 x 1.5 cm cystic structure in the medial left upper back (series 2/image 6), with mild wall thickening and surrounding mild fat stranding, favor a sebaceous cyst. Review of the MIP images confirms the above findings. CTA ABDOMEN AND PELVIS FINDINGS Hepatobiliary: Mild-to-moderate diffuse hepatic steatosis. No liver mass. No definite liver surface irregularity. Mild gallbladder distention. Suggestion of mild diffuse gallbladder wall thickening and pericholecystic fat stranding, new since this morning. No radiopaque cholelithiasis. No biliary ductal dilatation. Pancreas: Normal, with no mass or duct dilation. Spleen: Normal size. No mass. Adrenals/Urinary Tract: Normal adrenals. Subcentimeter hypodense renal cortical lesions in the mid to lower right kidney are too small to characterize. Otherwise normal kidneys, with no hydronephrosis. Normal bladder. Stomach/Bowel: Grossly normal stomach. Mild wall thickening in the descending duodenum with mild associated fat stranding surrounding the descending duodenum, which appears new. Normal caliber small bowel with no small bowel wall thickening. Normal appendix. Normal large bowel with no diverticulosis, large bowel wall thickening or pericolonic fat stranding. No pneumatosis. Vascular/Lymphatic: Mildly atherosclerotic nonaneurysmal abdominal aorta. No abdominal aortic dissection, pseudoaneurysm or penetrating atherosclerotic ulcer. Celiac artery, superior mesenteric artery, splenic artery, common hepatic artery, inferior mesenteric artery and bilateral single appearing renal arteries appear patent. Splenic and renal veins are patent. No pathologically enlarged lymph nodes  in the abdomen or pelvis. Reproductive: Normal size prostate. Other: No pneumoperitoneum, ascites or focal fluid  collection. Peripherally calcified circumscribed 4.4 x 4.2 cm cystic structure in the left anterior pelvis abutting the left external iliac vein (series 5/ image 167) is not appreciably changed in size since 09/18/2013, consistent with a benign extraperitoneal left pelvic cyst. Musculoskeletal: No aggressive appearing focal osseous lesions. Mild lumbar spondylosis. Review of the MIP images confirms the above findings. IMPRESSION: 1. No acute aortic syndrome.  Aortic atherosclerosis. 2. New gallbladder and descending duodenal wall thickening and new pericholecystic and peri-duodenal fat stranding. These findings are of uncertain etiology. Acute cholecystitis is a consideration. No radiopaque cholelithiasis. Acute infectious or inflammatory duodenitis or duodenal ulcer disease are considerations. Consider correlation with right upper quadrant abdominal sonogram. 3. No biliary ductal dilatation. 4. No pneumatosis or pneumoperitoneum.  No focal fluid collections. 5. Benign-appearing rim calcified extraperitoneal left anterior pelvic cyst is stable since 2015. 6. Three-vessel coronary atherosclerosis. 7. Mild-to-moderate diffuse hepatic steatosis. Electronically Signed   By: Ilona Sorrel M.D.   On: 07/27/2015 17:54   US Abdomen Limited Ruq  07/27/2015  CLINICAL DATA:  Right upper quadrant pain x1 day EXAM: US ABDOMEN LIMITED - RIGHT UPPER QUADRANT COMPARISON:  CT abdomen pelvis dated 07/27/2015 FINDINGS: Gallbladder: No gallstones, gallbladder wall thickening, or pericholecystic fluid. Common bile duct: Diameter: 3 mm Liver: Hyperechoic hepatic parenchyma, suggesting hepatic steatosis, with focal fatty sparing along the gallbladder fossa. IMPRESSION: Hepatic steatosis with focal fatty sparing. Electronically Signed   By: Julian Hy M.D.   On: 07/27/2015 18:57        Scheduled Meds: . diatrizoate meglumine-sodium  15 mL Oral Once  . enoxaparin (LOVENOX) injection  40 mg Subcutaneous Q24H  . pantoprazole  (PROTONIX) IV  40 mg Intravenous Q12H  . piperacillin-tazobactam (ZOSYN)  IV  3.375 g Intravenous Q8H   Continuous Infusions:    Time spent: > 35 minutes  Velvet Bathe, MD [facility] Pager [phone]  If 7PM-7AM, please contact night-coverage [email or web address] Password [facility] 07/28/2015, 12:44 PM

## 2015-07-29 LAB — CBC
HEMATOCRIT: 39.2 % (ref 39.0–52.0)
Hemoglobin: 13.5 g/dL (ref 13.0–17.0)
MCH: 30.2 pg (ref 26.0–34.0)
MCHC: 34.4 g/dL (ref 30.0–36.0)
MCV: 87.7 fL (ref 78.0–100.0)
PLATELETS: 215 10*3/uL (ref 150–400)
RBC: 4.47 MIL/uL (ref 4.22–5.81)
RDW: 12.9 % (ref 11.5–15.5)
WBC: 13.6 10*3/uL — AB (ref 4.0–10.5)

## 2015-07-29 LAB — PROCALCITONIN: PROCALCITONIN: 0.13 ng/mL

## 2015-07-29 MED ORDER — ALLOPURINOL 300 MG PO TABS
300.0000 mg | ORAL_TABLET | Freq: Every day | ORAL | Status: DC
  Administered 2015-07-29 – 2015-07-30 (×2): 300 mg via ORAL
  Filled 2015-07-29 (×2): qty 1

## 2015-07-29 NOTE — Progress Notes (Signed)
PROGRESS NOTE    David Hartman  [id number] DOB: 11-02-1960 DOA: 07/27/2015 PCP: Jeri Modena   Brief Narrative:  55 y.o. male with medical history significant for prostate cancer status post robotic surgery, hypertension, hyperlipidemia, GERD, and anxiety who presents the emergency department at the direction of his gastroenterologist for evaluation of severe abdominal pain with tachycardia and temperature of 37.8 C. Patient underwent routine screening colonoscopy yesterday with removal of 2 small polyps from the ascending colon and 2 small polyps from the transverse colon with cold snare.He was discharged home with analgesics and followed up today in the gastroenterology clinic. At [facility], his pain shifted from the chest down to the abdomen and she has experienced persistent severe abdominal pain throughout the day, seemingly unaffected by his opiate analgesic. Pain is described as sharp, severe, localized to the bilateral flanks and lower abdomen, worse with deep inspiration or movement, and with no alleviating factors identified.    Assessment & Plan:   Principal Problem:   Intractable abdominal pain - We'll continue to provide supportive therapy. Most likely secondary to duodenitis - Gi on board and assisting   Duodenitis -Plan will be to continue current IV antibiotics. Once patient is afebrile we'll plan on the descalating antibiotic regimen - diet being advanced.   Fever -Secondary to duodenitis will add acetaminophen  Active Problems:   GERD (gastroesophageal reflux disease) -Patient currently on Protonix, stable    Hyperlipidemia - Stable    Prostate cancer ([place])   HTN (hypertension) - Stable currently    Leukocytosis -Secondary to duodenitis. Continue antibiotic regimen and monitor next a.m.   DVT prophylaxis: lovenox Code Status: full Family Communication: None at bedside Disposition Plan: Pending continued improvement in  condition   Consultants:   GI following   Procedures: None   Antimicrobials: Zosyn   Subjective: Patient has no new complaints. No acute issues overnight  Objective: Filed Vitals:   07/28/15 1017 07/28/15 1430 07/28/15 2158 07/29/15 0540  BP:  144/80 118/61 111/63  Pulse:  89 80 84  Temp: 100.3 F (37.9 C) 98.7 F (37.1 C) 99.4 F (37.4 C) 98.4 F (36.9 C)  TempSrc: Oral Oral Oral Oral  Resp:  18 18 18   Height:      Weight:      SpO2:  98% 96% 96%    Intake/Output Summary (Last 24 hours) at 07/29/15 1333 Last data filed at 07/29/15 1100  Gross per 24 hour  Intake   1120 ml  Output   1625 ml  Net   -505 ml   Filed Weights   07/27/15 2125  Weight: 111.358 kg (245 lb 8 oz)    Examination:  General exam: awake and alert, in nad.  Respiratory system: Clear to auscultation. Respiratory effort normal. Cardiovascular system: S1 & S2 heard, RRR. No JVD, murmurs, rubs, gallops or clicks. No pedal edema. Gastrointestinal system: Abdomen is nondistended, soft and tendernes at upper quadrant, no rebound tenderness, + bowel sounds.  Central nervous system: Alert and oriented. No focal neurological deficits. Extremities: Symmetric 5 x 5 power. Skin: No rashes, lesions or ulcers Psychiatry: Judgement and insight appear normal. Mood & affect appropriate.   Data Reviewed: I have personally reviewed following labs and imaging studies  CBC:  Recent Labs Lab 07/26/15 2249 07/27/15 1406 07/27/15 1611 07/28/15 0316 07/29/15 0428  WBC 9.9 23.0 Repeated and verified X2.* 19.0* 17.2* 13.6*  NEUTROABS  --  20.0* 15.9*  --   --   HGB 14.0 14.8 15.3  13.5 13.5  HCT 42.1 44.1 43.3 41.4 39.2  MCV 90.5 88.8 86.4 92.2 87.7  PLT 262 308.0 266 233 [id number]   Basic Metabolic Panel:  Recent Labs Lab 07/26/15 2249 07/27/15 1611 07/28/15 0317  NA 138 136 133*  K 4.1 3.9 4.7  CL 105 104 104  CO2 24 23 22   GLUCOSE 153* 108* 119*  BUN 19 17 16   CREATININE 1.30* 1.10 1.02   CALCIUM 8.6* 9.2 8.2*   GFR: Estimated Creatinine Clearance: 103.5 mL/min (by C-G formula based on Cr of 1.02). Liver Function Tests:  Recent Labs Lab 07/26/15 2249 07/27/15 1611 07/28/15 0317  AST 29 27 24   ALT 31 30 27   ALKPHOS 53 54 47  BILITOT 0.5 1.0 1.2  PROT 6.9 7.5 6.6  ALBUMIN 4.1 4.5 3.8    Recent Labs Lab 07/26/15 2249 07/27/15 1611  LIPASE 27 23   No results for input(s): AMMONIA in the last 168 hours. Coagulation Profile: No results for input(s): INR, PROTIME in the last 168 hours. Cardiac Enzymes:  Recent Labs Lab 07/26/15 2249 07/27/15 0228  TROPONINI <0.03 <0.03   BNP (last 3 results) No results for input(s): PROBNP in the last 8760 hours. HbA1C: No results for input(s): HGBA1C in the last 72 hours. CBG: No results for input(s): GLUCAP in the last 168 hours. Lipid Profile: No results for input(s): CHOL, HDL, LDLCALC, TRIG, CHOLHDL, LDLDIRECT in the last 72 hours. Thyroid Function Tests: No results for input(s): TSH, T4TOTAL, FREET4, T3FREE, THYROIDAB in the last 72 hours. Anemia Panel: No results for input(s): VITAMINB12, FOLATE, FERRITIN, TIBC, IRON, RETICCTPCT in the last 72 hours. Sepsis Labs:  Recent Labs Lab 07/27/15 1620 07/27/15 2107 07/29/15 0428  PROCALCITON  --  0.16 0.13  LATICACIDVEN 1.78  --   --     No results found for this or any previous visit (from the past 240 hour(s)).       Radiology Studies: Dg Abd 2 Views  07/27/2015  CLINICAL DATA:  Generalized abdominal pain. Status post colonoscopy 1 day prior. EXAM: ABDOMEN - 2 VIEW COMPARISON:  07/27/2015 CT abdomen/ pelvis. FINDINGS: No dilated small bowel loops. Oral contrast has progressed to the splenic flexure of the colon. No evidence of pneumatosis, pneumoperitoneum or pathologic soft tissue calcification. Moderate thoracolumbar spondylosis. IMPRESSION: Nonobstructive bowel gas pattern. Progression of oral contrast to the splenic flexure of the colon. No evidence of  pneumatosis or pneumoperitoneum. Electronically Signed   By: Ilona Sorrel M.D.   On: 07/27/2015 14:29   Ct Angio Chest/abd/pel For Dissection W And/or W/wo  07/27/2015  CLINICAL DATA:  55 year old male with severe lower abdominal and right flank pain one day status post colonoscopy. Chest pain and nausea and vomiting one night prior. Leukocytosis. EXAM: CT ANGIOGRAPHY CHEST, ABDOMEN AND PELVIS TECHNIQUE: Multidetector CT imaging through the chest, abdomen and pelvis was performed using the standard protocol during bolus administration of intravenous contrast. Multiplanar reconstructed images and MIPs were obtained and reviewed to evaluate the vascular anatomy. CONTRAST:  100 cc Isovue 370 IV. COMPARISON:  07/27/2015 CT chest, abdomen and pelvis (earlier today). FINDINGS: CTA CHEST FINDINGS Mediastinum/Nodes: Normal heart size. No significant pericardial fluid/thickening. Left anterior descending, left circumflex and right coronary atherosclerosis. Minimally atherosclerotic nonaneurysmal thoracic aorta. No acute intramural aortic hematoma. No aortic dissection, pseudoaneurysm or penetrating atherosclerotic ulcer. Aortic arch vessels are patent. Normal caliber pulmonary arteries. No central pulmonary emboli. No discrete thyroid nodules. Unremarkable esophagus. No pathologically enlarged axillary, mediastinal or hilar lymph nodes. Lungs/Pleura: No  pneumothorax. No pleural effusion. Mild hypoventilatory changes in the dependent lower lobes. No acute consolidative airspace disease, significant pulmonary nodules or lung masses. Musculoskeletal: No aggressive appearing focal osseous lesions. Mild-to-moderate thoracic spondylosis. Subcutaneous circumscribed 2.8 x 1.5 cm cystic structure in the medial left upper back (series 2/image 6), with mild wall thickening and surrounding mild fat stranding, favor a sebaceous cyst. Review of the MIP images confirms the above findings. CTA ABDOMEN AND PELVIS FINDINGS Hepatobiliary:  Mild-to-moderate diffuse hepatic steatosis. No liver mass. No definite liver surface irregularity. Mild gallbladder distention. Suggestion of mild diffuse gallbladder wall thickening and pericholecystic fat stranding, new since this morning. No radiopaque cholelithiasis. No biliary ductal dilatation. Pancreas: Normal, with no mass or duct dilation. Spleen: Normal size. No mass. Adrenals/Urinary Tract: Normal adrenals. Subcentimeter hypodense renal cortical lesions in the mid to lower right kidney are too small to characterize. Otherwise normal kidneys, with no hydronephrosis. Normal bladder. Stomach/Bowel: Grossly normal stomach. Mild wall thickening in the descending duodenum with mild associated fat stranding surrounding the descending duodenum, which appears new. Normal caliber small bowel with no small bowel wall thickening. Normal appendix. Normal large bowel with no diverticulosis, large bowel wall thickening or pericolonic fat stranding. No pneumatosis. Vascular/Lymphatic: Mildly atherosclerotic nonaneurysmal abdominal aorta. No abdominal aortic dissection, pseudoaneurysm or penetrating atherosclerotic ulcer. Celiac artery, superior mesenteric artery, splenic artery, common hepatic artery, inferior mesenteric artery and bilateral single appearing renal arteries appear patent. Splenic and renal veins are patent. No pathologically enlarged lymph nodes in the abdomen or pelvis. Reproductive: Normal size prostate. Other: No pneumoperitoneum, ascites or focal fluid collection. Peripherally calcified circumscribed 4.4 x 4.2 cm cystic structure in the left anterior pelvis abutting the left external iliac vein (series 5/ image 167) is not appreciably changed in size since 09/18/2013, consistent with a benign extraperitoneal left pelvic cyst. Musculoskeletal: No aggressive appearing focal osseous lesions. Mild lumbar spondylosis. Review of the MIP images confirms the above findings. IMPRESSION: 1. No acute aortic  syndrome.  Aortic atherosclerosis. 2. New gallbladder and descending duodenal wall thickening and new pericholecystic and peri-duodenal fat stranding. These findings are of uncertain etiology. Acute cholecystitis is a consideration. No radiopaque cholelithiasis. Acute infectious or inflammatory duodenitis or duodenal ulcer disease are considerations. Consider correlation with right upper quadrant abdominal sonogram. 3. No biliary ductal dilatation. 4. No pneumatosis or pneumoperitoneum.  No focal fluid collections. 5. Benign-appearing rim calcified extraperitoneal left anterior pelvic cyst is stable since 2015. 6. Three-vessel coronary atherosclerosis. 7. Mild-to-moderate diffuse hepatic steatosis. Electronically Signed   By: Ilona Sorrel M.D.   On: 07/27/2015 17:54   US Abdomen Limited Ruq  07/27/2015  CLINICAL DATA:  Right upper quadrant pain x1 day EXAM: US ABDOMEN LIMITED - RIGHT UPPER QUADRANT COMPARISON:  CT abdomen pelvis dated 07/27/2015 FINDINGS: Gallbladder: No gallstones, gallbladder wall thickening, or pericholecystic fluid. Common bile duct: Diameter: 3 mm Liver: Hyperechoic hepatic parenchyma, suggesting hepatic steatosis, with focal fatty sparing along the gallbladder fossa. IMPRESSION: Hepatic steatosis with focal fatty sparing. Electronically Signed   By: Julian Hy M.D.   On: 07/27/2015 18:57        Scheduled Meds: . diatrizoate meglumine-sodium  15 mL Oral Once  . enoxaparin (LOVENOX) injection  40 mg Subcutaneous Q24H  . pantoprazole (PROTONIX) IV  40 mg Intravenous Q12H  . piperacillin-tazobactam (ZOSYN)  IV  3.375 g Intravenous Q8H   Continuous Infusions:    Time spent: > 35 minutes  Velvet Bathe, MD [facility] Pager [phone]  If 7PM-7AM, please contact  night-coverage [email or web address] Password [email or web address] 07/29/2015, 1:33 PM

## 2015-07-29 NOTE — Progress Notes (Signed)
Progress Note   Subjective  Tolerated clear liquids without difficulty- no nausea or vomiting. Pain is decreasing . Having hot and cold spells  But no temp spikes  BC pending   Objective   Vital signs in last 24 hours: Temp:  [98.4 F (36.9 C)-100.3 F (37.9 C)] 98.4 F (36.9 C) (07/09 0540) Pulse Rate:  [80-89] 84 (07/09 0540) Resp:  [18] 18 (07/09 0540) BP: (111-144)/(61-80) 111/63 mmHg (07/09 0540) SpO2:  [96 %-98 %] 96 % (07/09 0540) Last BM Date: 07/26/15 General:    white male  in NAD, more comfortable appearing Heart:  Regular rate and rhythm; no murmurs Lungs: Respirations even and unlabored, lungs CTA bilaterally Abdomen:  Soft,much less tender and nondistended. Normal bowel sounds. Extremities:  Without edema. Neurologic:  Alert and oriented,  grossly normal neurologically. Psych:  Cooperative. Normal mood and affect.  Intake/Output from previous day: 07/08 0701 - 07/09 0700 In: 1420 [P.O.:1420] Out: [id number] [Urine:2475] Intake/Output this shift:    Lab Results:  Recent Labs  24-Aug-2015 1611 07/28/15 0316 07/29/15 0428  WBC 19.0* 17.2* 13.6*  HGB 15.3 13.5 13.5  HCT 43.3 41.4 39.2  PLT 266 233 215   BMET  Recent Labs  07/26/15 2249 August 24, 2015 1611 07/28/15 0317  NA 138 136 133*  K 4.1 3.9 4.7  CL 105 104 104  CO2 24 23 22   GLUCOSE 153* 108* 119*  BUN 19 17 16   CREATININE 1.30* 1.10 1.02  CALCIUM 8.6* 9.2 8.2*   LFT  Recent Labs  07/26/15 2249  07/28/15 0317  PROT 6.9  < > 6.6  ALBUMIN 4.1  < > 3.8  AST 29  < > 24  ALT 31  < > 27  ALKPHOS 53  < > 47  BILITOT 0.5  < > 1.2  BILIDIR 0.1  --   --   IBILI 0.4  --   --   < > = values in this interval not displayed. PT/INR No results for input(s): LABPROT, INR in the last 72 hours.  Studies/Results: Dg Abd 2 Views  24-Aug-2015  CLINICAL DATA:  Generalized abdominal pain. Status post colonoscopy 1 day prior. EXAM: ABDOMEN - 2 VIEW COMPARISON:  08/24/2015 CT abdomen/ pelvis. FINDINGS: No  dilated small bowel loops. Oral contrast has progressed to the splenic flexure of the colon. No evidence of pneumatosis, pneumoperitoneum or pathologic soft tissue calcification. Moderate thoracolumbar spondylosis. IMPRESSION: Nonobstructive bowel gas pattern. Progression of oral contrast to the splenic flexure of the colon. No evidence of pneumatosis or pneumoperitoneum. Electronically Signed   By: Ilona Sorrel M.D.   On: 24-Aug-2015 14:29   Ct Angio Chest/abd/pel For Dissection W And/or W/wo  08-24-15  CLINICAL DATA:  55 year old male with severe lower abdominal and right flank pain one day status post colonoscopy. Chest pain and nausea and vomiting one night prior. Leukocytosis. EXAM: CT ANGIOGRAPHY CHEST, ABDOMEN AND PELVIS TECHNIQUE: Multidetector CT imaging through the chest, abdomen and pelvis was performed using the standard protocol during bolus administration of intravenous contrast. Multiplanar reconstructed images and MIPs were obtained and reviewed to evaluate the vascular anatomy. CONTRAST:  100 cc Isovue 370 IV. COMPARISON:  Aug 24, 2015 CT chest, abdomen and pelvis (earlier today). FINDINGS: CTA CHEST FINDINGS Mediastinum/Nodes: Normal heart size. No significant pericardial fluid/thickening. Left anterior descending, left circumflex and right coronary atherosclerosis. Minimally atherosclerotic nonaneurysmal thoracic aorta. No acute intramural aortic hematoma. No aortic dissection, pseudoaneurysm or penetrating atherosclerotic ulcer. Aortic arch vessels are patent. Normal caliber pulmonary arteries. No  central pulmonary emboli. No discrete thyroid nodules. Unremarkable esophagus. No pathologically enlarged axillary, mediastinal or hilar lymph nodes. Lungs/Pleura: No pneumothorax. No pleural effusion. Mild hypoventilatory changes in the dependent lower lobes. No acute consolidative airspace disease, significant pulmonary nodules or lung masses. Musculoskeletal: No aggressive appearing focal osseous  lesions. Mild-to-moderate thoracic spondylosis. Subcutaneous circumscribed 2.8 x 1.5 cm cystic structure in the medial left upper back (series 2/image 6), with mild wall thickening and surrounding mild fat stranding, favor a sebaceous cyst. Review of the MIP images confirms the above findings. CTA ABDOMEN AND PELVIS FINDINGS Hepatobiliary: Mild-to-moderate diffuse hepatic steatosis. No liver mass. No definite liver surface irregularity. Mild gallbladder distention. Suggestion of mild diffuse gallbladder wall thickening and pericholecystic fat stranding, new since this morning. No radiopaque cholelithiasis. No biliary ductal dilatation. Pancreas: Normal, with no mass or duct dilation. Spleen: Normal size. No mass. Adrenals/Urinary Tract: Normal adrenals. Subcentimeter hypodense renal cortical lesions in the mid to lower right kidney are too small to characterize. Otherwise normal kidneys, with no hydronephrosis. Normal bladder. Stomach/Bowel: Grossly normal stomach. Mild wall thickening in the descending duodenum with mild associated fat stranding surrounding the descending duodenum, which appears new. Normal caliber small bowel with no small bowel wall thickening. Normal appendix. Normal large bowel with no diverticulosis, large bowel wall thickening or pericolonic fat stranding. No pneumatosis. Vascular/Lymphatic: Mildly atherosclerotic nonaneurysmal abdominal aorta. No abdominal aortic dissection, pseudoaneurysm or penetrating atherosclerotic ulcer. Celiac artery, superior mesenteric artery, splenic artery, common hepatic artery, inferior mesenteric artery and bilateral single appearing renal arteries appear patent. Splenic and renal veins are patent. No pathologically enlarged lymph nodes in the abdomen or pelvis. Reproductive: Normal size prostate. Other: No pneumoperitoneum, ascites or focal fluid collection. Peripherally calcified circumscribed 4.4 x 4.2 cm cystic structure in the left anterior pelvis abutting  the left external iliac vein (series 5/ image 167) is not appreciably changed in size since 09/18/2013, consistent with a benign extraperitoneal left pelvic cyst. Musculoskeletal: No aggressive appearing focal osseous lesions. Mild lumbar spondylosis. Review of the MIP images confirms the above findings. IMPRESSION: 1. No acute aortic syndrome.  Aortic atherosclerosis. 2. New gallbladder and descending duodenal wall thickening and new pericholecystic and peri-duodenal fat stranding. These findings are of uncertain etiology. Acute cholecystitis is a consideration. No radiopaque cholelithiasis. Acute infectious or inflammatory duodenitis or duodenal ulcer disease are considerations. Consider correlation with right upper quadrant abdominal sonogram. 3. No biliary ductal dilatation. 4. No pneumatosis or pneumoperitoneum.  No focal fluid collections. 5. Benign-appearing rim calcified extraperitoneal left anterior pelvic cyst is stable since 2015. 6. Three-vessel coronary atherosclerosis. 7. Mild-to-moderate diffuse hepatic steatosis. Electronically Signed   By: Ilona Sorrel M.D.   On: 07/27/2015 17:54   US Abdomen Limited Ruq  07/27/2015  CLINICAL DATA:  Right upper quadrant pain x1 day EXAM: US ABDOMEN LIMITED - RIGHT UPPER QUADRANT COMPARISON:  CT abdomen pelvis dated 07/27/2015 FINDINGS: Gallbladder: No gallstones, gallbladder wall thickening, or pericholecystic fluid. Common bile duct: Diameter: 3 mm Liver: Hyperechoic hepatic parenchyma, suggesting hepatic steatosis, with focal fatty sparing along the gallbladder fossa. IMPRESSION: Hepatic steatosis with focal fatty sparing. Electronically Signed   By: Julian Hy M.D.   On: 07/27/2015 18:57       Assessment / Plan:    #1 55 yo male  With acute severe  Rather diffuse abdominal pain onset several hours after colonoscopy with polypectomy of small polyps on 7/6 Extensive workup thus far negative except for duodenitis .  he has been febrile and had WBC  of 23  On admit - suspect acute bacterial infection ?some sort of bacterial translocation with peritonitis type picture   He is better , and responding to IV Zosyn - await cultures, continue Zosyn Will advance diet to full liquids  Today  continue BID PPI Principal Problem:   Intractable abdominal pain Active Problems:   GERD (gastroesophageal reflux disease)   Hyperlipidemia   Prostate cancer ([facility])   HTN (hypertension)   Leukocytosis   Anxiety   Duodenitis   Diffuse abdominal pain     LOS: 1 day   Amy Esterwood  07/29/2015, 9:25 AM

## 2015-07-30 LAB — CBC
HEMATOCRIT: 39.8 % (ref 39.0–52.0)
HEMOGLOBIN: 13.2 g/dL (ref 13.0–17.0)
MCH: 30 pg (ref 26.0–34.0)
MCHC: 33.2 g/dL (ref 30.0–36.0)
MCV: 90.5 fL (ref 78.0–100.0)
Platelets: 255 10*3/uL (ref 150–400)
RBC: 4.4 MIL/uL (ref 4.22–5.81)
RDW: 13 % (ref 11.5–15.5)
WBC: 10.3 10*3/uL (ref 4.0–10.5)

## 2015-07-30 MED ORDER — CIPROFLOXACIN HCL 500 MG PO TABS: 500.0000 mg | ORAL_TABLET | Freq: Two times a day (BID) | ORAL | Status: DC

## 2015-07-30 MED ORDER — METRONIDAZOLE 500 MG PO TABS: 500.0000 mg | ORAL_TABLET | Freq: Three times a day (TID) | ORAL | Status: DC

## 2015-07-30 MED ORDER — OXYCODONE HCL 5 MG PO CAPS: 5.0000 mg | ORAL_CAPSULE | ORAL | Status: DC | PRN

## 2015-07-30 MED ORDER — COLCHICINE 0.6 MG PO TABS
0.6000 mg | ORAL_TABLET | Freq: Every day | ORAL | Status: DC
  Administered 2015-07-30: 0.6 mg via ORAL
  Filled 2015-07-30 (×3): qty 1

## 2015-07-30 MED ORDER — COLCHICINE 0.6 MG PO TABS: 0.6000 mg | ORAL_TABLET | Freq: Every day | ORAL | Status: DC

## 2015-07-30 MED FILL — Oxycodone w/ Acetaminophen Tab 5-325 MG: ORAL | Qty: 6 | Status: AC

## 2015-07-30 NOTE — Progress Notes (Signed)
    Progress Note   Subjective  David Hartman is a 55 y/o Caucasian male who was admitted on 07/27/2015 for abdominal pain and a leukocytosis after recent colonoscopy.  This morning, the patient is found laying in bed having just eaten eggs and some other solids for breakfast. He explains that eating did not seem to worsen any of his symptoms. He tells me that he does continue with a small amount of abdominal pain which is worse if he belches, but this is "much better than than when I came in". The patient denies having a bowel movement recently, though this was "the first solid meal I've eaten in 4 days". He denies any new complaints or concerns and feels comfortable with plans for discharge today.   Objective   Vital signs in last 24 hours: Temp:  [97.7 F (36.5 C)-98.1 F (36.7 C)] 97.7 F (36.5 C) (07/10 0430) Pulse Rate:  [65-74] 65 (07/10 0430) Resp:  [18] 18 (07/10 0430) BP: (111-122)/(69-78) 122/78 mmHg (07/10 0430) SpO2:  [98 %-100 %] 100 % (07/10 0430) Last BM Date: 07/26/15 General: Caucasian male in NAD Heart:  Regular rate and rhythm; no murmurs Lungs: Respirations even and unlabored, lungs CTA bilaterally Abdomen:  Soft, mild ttp in RUQ and LLQ and nondistended. Normal bowel sounds. Extremities:  Without edema. Neurologic:  Alert and oriented,  grossly normal neurologically. Psych:  Cooperative. Normal mood and affect.  Intake/Output from previous day: 07/09 0701 - 07/10 0700 In: 600 [P.O.:600] Out: 1250 [Urine:1250] Intake/Output this shift: Total I/O In: 240 [P.O.:240] Out: 450 [Urine:450]  Lab Results:  Recent Labs  07/28/15 0316 07/29/15 0428 07/30/15 0319  WBC 17.2* 13.6* 10.3  HGB 13.5 13.5 13.2  HCT 41.4 39.2 39.8  PLT 233 215 255   BMET  Recent Labs  07/27/15 1611 07/28/15 0317  NA 136 133*  K 3.9 4.7  CL 104 104  CO2 23 22  GLUCOSE 108* 119*  BUN 17 16  CREATININE 1.10 1.02  CALCIUM 9.2 8.2*   LFT  Recent Labs  07/28/15 0317    PROT 6.6  ALBUMIN 3.8  AST 24  ALT 27  ALKPHOS 47  BILITOT 1.2   PT/INR No results for input(s): LABPROT, INR in the last 72 hours.  Studies/Results: No results found.     Assessment / Plan:   Assessment: 1. Abdominal pain: Decreased from previous, etiology still uncertain-consider duodenitis? 2. Dyspepsia: Now improved, tolerating regular diet 3. Abnormal CT: Duodenitis seen on recent CT-continue BID PPI 4. Leukocytosis: Now improving after abx  Plan: 1. Patient doing well on regular diet this morning, per Dr. Doyne Keel recommendations, plan for discharge today with total of 7-10 days of cipro and flagyl 2. Also d/c home with some narcotics for pain management 3. Continue PPI 4. Will arrange follow up with our office in 2-3 weeks 5. Will discuss above with Dr. Loletha Carrow, please await any further recs  Thank you for your kind consultation, we will sign off.  Principal Problem:   Intractable abdominal pain Active Problems:   GERD (gastroesophageal reflux disease)   Hyperlipidemia   Prostate cancer ([facility])   HTN (hypertension)   Leukocytosis   Anxiety   Duodenitis   Diffuse abdominal pain   LOS: 2 days   Levin Erp  07/30/2015, 9:56 AM  Pager # [phone]

## 2015-07-30 NOTE — Progress Notes (Signed)
Patient discharged to home, all discharge medications and instructions reviewed and questions answered.  Patient to be assisted to vehicle by wheelchair.  

## 2015-07-30 NOTE — Discharge Summary (Signed)
Physician Discharge Summary  David Hartman [id number] DOB: Feb 10, 1960 DOA: 07/27/2015  PCP: Jeri Modena  Admit date: 07/27/2015 Discharge date: 07/30/2015  Time spent: > 35  minutes  Recommendations for Outpatient Follow-up:  Please decide whether or not to prolong antibiotic regimen. He'll be discharged on 7 more days of antibiotics to complete a 10 day treatment regimen  Discharge Diagnoses:  Principal Problem:   Intractable abdominal pain Active Problems:   GERD (gastroesophageal reflux disease)   Hyperlipidemia   Prostate cancer (HCC)   HTN (hypertension)   Leukocytosis   Anxiety   Duodenitis   Diffuse abdominal pain   Discharge Condition: Stable  Diet recommendation: Soft diet  Filed Weights   07/27/15 2125  Weight: 111.358 kg (245 lb 8 oz)    History of present illness:  Patient is a 55 year old with history of hypertension, hyperlipidemia, GERD, anxiety who had recent colonoscopy and subsequently developed severe abdominal discomfort and poor oral intake. Further evaluation would reveal patient had duodenitis.  Hospital Course:  Duodenitis - Improved on IV antibiotics and supportive therapy. Plan will be to discharge on 7 more days of oral antibiotics Cipro and Flagyl. - Recommend patient follow-up with gastroenterologist  Otherwise for known medical conditions listed above will continue medication regimen listed below  Procedures:  None  Consultations:  Gastroenterology  Discharge Exam: Filed Vitals:   07/29/15 1351 07/30/15 0430  BP: 111/69 122/78  Pulse: 74 65  Temp: 98.1 F (36.7 C) 97.7 F (36.5 C)  Resp: 18 18    General: Patient in no acute distress, alert and awake Cardiovascular: Regular rate and rhythm, no rubs Respiratory: No increased work of breathing, no wheezes  Discharge Instructions   Discharge Instructions    Call MD for:  severe uncontrolled pain    Complete by:  As directed      Call MD for:  temperature  >100.4    Complete by:  As directed      Diet - low sodium heart healthy    Complete by:  As directed      Discharge instructions    Complete by:  As directed   Please follow up with your gastroenterologist after discharge. Call their office to confirm appointment date and time after hospital discharge.     Increase activity slowly    Complete by:  As directed           Current Discharge Medication List    START taking these medications   Details  ciprofloxacin (CIPRO) 500 MG tablet Take 1 tablet (500 mg total) by mouth 2 (two) times daily. Qty: 14 tablet, Refills: 0    colchicine 0.6 MG tablet Take 1 tablet (0.6 mg total) by mouth daily. Qty: 5 tablet, Refills: 0    metroNIDAZOLE (FLAGYL) 500 MG tablet Take 1 tablet (500 mg total) by mouth 3 (three) times daily. Qty: 21 tablet, Refills: 0    oxycodone (OXY-IR) 5 MG capsule Take 1 capsule (5 mg total) by mouth every 4 (four) hours as needed. Qty: 30 capsule, Refills: 0      CONTINUE these medications which have NOT CHANGED   Details  allopurinol (ZYLOPRIM) 300 MG tablet Take 300 mg by mouth daily.    ALPRAZolam (XANAX) 1 MG tablet Take 0.5 mg by mouth daily as needed for anxiety.     lisinopril (PRINIVIL,ZESTRIL) 40 MG tablet Take 1 tablet by mouth daily.    omeprazole (PRILOSEC OTC) 20 MG tablet Take 20 mg by mouth daily.  Simethicone (GAS-X PO) Take 2 tablets by mouth daily as needed (gas).    simvastatin (ZOCOR) 40 MG tablet Take 40 mg by mouth every evening.      STOP taking these medications     ibuprofen (ADVIL,MOTRIN) 200 MG tablet        No Known Allergies    The results of significant diagnostics from this hospitalization (including imaging, microbiology, ancillary and laboratory) are listed below for reference.    Significant Diagnostic Studies: Dg Chest 2 View  07/07/2015  CLINICAL DATA:  Chest pain. EXAM: CHEST  2 VIEW COMPARISON:  Radiograph of April 15, 2015. FINDINGS: The heart size and  mediastinal contours are within normal limits. Both lungs are clear. No pneumothorax or pleural effusion is noted. The visualized skeletal structures are unremarkable. IMPRESSION: No active cardiopulmonary disease. Electronically Signed   By: Marijo Conception, M.D.   On: 07/07/2015 15:38   Ct Angio Chest Pe W/cm &/or Wo Cm  07/27/2015  CLINICAL DATA:  Acute onset of severe bilateral rib pain. Recent colonoscopy with polyp removal. Shortness of breath, nausea and diaphoresis. Initial encounter. EXAM: CT ANGIOGRAPHY CHEST CT ABDOMEN AND PELVIS WITH CONTRAST TECHNIQUE: Multidetector CT imaging of the chest was performed using the standard protocol during bolus administration of intravenous contrast. Multiplanar CT image reconstructions and MIPs were obtained to evaluate the vascular anatomy. Multidetector CT imaging of the abdomen and pelvis was performed using the standard protocol during bolus administration of intravenous contrast. CONTRAST:  100 mL of Isovue 370 IV contrast COMPARISON:  CT of the abdomen and pelvis from 01/29/2014, and chest radiograph performed 07/26/2015 FINDINGS: CTA CHEST FINDINGS There is no evidence of pulmonary embolus. Minimal bilateral atelectasis is noted. The lungs are otherwise clear. There is no evidence of significant focal consolidation, pleural effusion or pneumothorax. No masses are identified; no abnormal focal contrast enhancement is seen. Mild coronary artery calcification is noted. No mediastinal lymphadenopathy is seen. No pericardial effusion is identified. The great vessels are grossly unremarkable in appearance. No axillary lymphadenopathy is seen. The visualized portions of the thyroid gland are unremarkable in appearance. No acute osseous abnormalities are seen. CT ABDOMEN and PELVIS FINDINGS The liver and spleen are unremarkable in appearance. The gallbladder is within normal limits. The pancreas and adrenal glands are unremarkable. Scattered small bilateral renal cysts  are seen. There is no evidence of hydronephrosis. No renal or ureteral stones are seen. No significant perinephric stranding is appreciated. No free fluid is identified. The small bowel is unremarkable in appearance. The stomach is within normal limits. No acute vascular abnormalities are seen. The appendix is normal in caliber, without evidence of appendicitis. The colon is unremarkable in appearance. The bladder is mildly distended and grossly unremarkable. A nonspecific 7.7 cm cystic structure with peripheral calcification is noted at the left upper hemipelvis. This is stable from 2016 and likely benign. The patient is status post prostatectomy. No inguinal lymphadenopathy is seen. No acute osseous abnormalities are identified. Review of the MIP images confirms the above findings. IMPRESSION: 1. No evidence of pulmonary embolus. 2. Minimal bibasilar atelectasis noted.  Lungs otherwise clear. 3. Mild coronary artery calcifications seen. 4. Scattered small bilateral renal cysts seen. 5. Stable nonspecific 7.7 cm cystic structure with peripheral calcification at the left upper hemipelvis is likely benign. Electronically Signed   By: Garald Balding M.D.   On: 07/27/2015 01:58   Ct Abdomen Pelvis W Contrast  07/27/2015  CLINICAL DATA:  Acute onset of severe bilateral rib pain.  Recent colonoscopy with polyp removal. Shortness of breath, nausea and diaphoresis. Initial encounter. EXAM: CT ANGIOGRAPHY CHEST CT ABDOMEN AND PELVIS WITH CONTRAST TECHNIQUE: Multidetector CT imaging of the chest was performed using the standard protocol during bolus administration of intravenous contrast. Multiplanar CT image reconstructions and MIPs were obtained to evaluate the vascular anatomy. Multidetector CT imaging of the abdomen and pelvis was performed using the standard protocol during bolus administration of intravenous contrast. CONTRAST:  100 mL of Isovue 370 IV contrast COMPARISON:  CT of the abdomen and pelvis from  01/29/2014, and chest radiograph performed 07/26/2015 FINDINGS: CTA CHEST FINDINGS There is no evidence of pulmonary embolus. Minimal bilateral atelectasis is noted. The lungs are otherwise clear. There is no evidence of significant focal consolidation, pleural effusion or pneumothorax. No masses are identified; no abnormal focal contrast enhancement is seen. Mild coronary artery calcification is noted. No mediastinal lymphadenopathy is seen. No pericardial effusion is identified. The great vessels are grossly unremarkable in appearance. No axillary lymphadenopathy is seen. The visualized portions of the thyroid gland are unremarkable in appearance. No acute osseous abnormalities are seen. CT ABDOMEN and PELVIS FINDINGS The liver and spleen are unremarkable in appearance. The gallbladder is within normal limits. The pancreas and adrenal glands are unremarkable. Scattered small bilateral renal cysts are seen. There is no evidence of hydronephrosis. No renal or ureteral stones are seen. No significant perinephric stranding is appreciated. No free fluid is identified. The small bowel is unremarkable in appearance. The stomach is within normal limits. No acute vascular abnormalities are seen. The appendix is normal in caliber, without evidence of appendicitis. The colon is unremarkable in appearance. The bladder is mildly distended and grossly unremarkable. A nonspecific 7.7 cm cystic structure with peripheral calcification is noted at the left upper hemipelvis. This is stable from 2016 and likely benign. The patient is status post prostatectomy. No inguinal lymphadenopathy is seen. No acute osseous abnormalities are identified. Review of the MIP images confirms the above findings. IMPRESSION: 1. No evidence of pulmonary embolus. 2. Minimal bibasilar atelectasis noted.  Lungs otherwise clear. 3. Mild coronary artery calcifications seen. 4. Scattered small bilateral renal cysts seen. 5. Stable nonspecific 7.7 cm cystic  structure with peripheral calcification at the left upper hemipelvis is likely benign. Electronically Signed   By: Garald Balding M.D.   On: 07/27/2015 01:58   Dg Abd 2 Views  07/27/2015  CLINICAL DATA:  Generalized abdominal pain. Status post colonoscopy 1 day prior. EXAM: ABDOMEN - 2 VIEW COMPARISON:  07/27/2015 CT abdomen/ pelvis. FINDINGS: No dilated small bowel loops. Oral contrast has progressed to the splenic flexure of the colon. No evidence of pneumatosis, pneumoperitoneum or pathologic soft tissue calcification. Moderate thoracolumbar spondylosis. IMPRESSION: Nonobstructive bowel gas pattern. Progression of oral contrast to the splenic flexure of the colon. No evidence of pneumatosis or pneumoperitoneum. Electronically Signed   By: Ilona Sorrel M.D.   On: 07/27/2015 14:29   Dg Abd Acute W/chest  07/26/2015  CLINICAL DATA:  Severe upper abdominal pain in chest pain beginning 2 hours ago. Colonoscopy due date. EXAM: DG ABDOMEN ACUTE W/ 1V CHEST COMPARISON:  07/07/2015 FINDINGS: No evidence of ileus, obstruction or free air. The patient appears to have fecal matter in the colon, unusual given the history of colonoscopy due day. No abnormal calcifications are bony findings. One view chest shows normal heart and mediastinal shadows. The lungs are clear. No free air seen under the diaphragm. IMPRESSION: Negative abdominal radiographs.  No acute cardiopulmonary disease. Electronically Signed  By: Nelson Chimes M.D.   On: 07/26/2015 23:38   Ct Angio Chest/abd/pel For Dissection W And/or W/wo  07/27/2015  CLINICAL DATA:  55 year old male with severe lower abdominal and right flank pain one day status post colonoscopy. Chest pain and nausea and vomiting one night prior. Leukocytosis. EXAM: CT ANGIOGRAPHY CHEST, ABDOMEN AND PELVIS TECHNIQUE: Multidetector CT imaging through the chest, abdomen and pelvis was performed using the standard protocol during bolus administration of intravenous contrast. Multiplanar  reconstructed images and MIPs were obtained and reviewed to evaluate the vascular anatomy. CONTRAST:  100 cc Isovue 370 IV. COMPARISON:  07/27/2015 CT chest, abdomen and pelvis (earlier today). FINDINGS: CTA CHEST FINDINGS Mediastinum/Nodes: Normal heart size. No significant pericardial fluid/thickening. Left anterior descending, left circumflex and right coronary atherosclerosis. Minimally atherosclerotic nonaneurysmal thoracic aorta. No acute intramural aortic hematoma. No aortic dissection, pseudoaneurysm or penetrating atherosclerotic ulcer. Aortic arch vessels are patent. Normal caliber pulmonary arteries. No central pulmonary emboli. No discrete thyroid nodules. Unremarkable esophagus. No pathologically enlarged axillary, mediastinal or hilar lymph nodes. Lungs/Pleura: No pneumothorax. No pleural effusion. Mild hypoventilatory changes in the dependent lower lobes. No acute consolidative airspace disease, significant pulmonary nodules or lung masses. Musculoskeletal: No aggressive appearing focal osseous lesions. Mild-to-moderate thoracic spondylosis. Subcutaneous circumscribed 2.8 x 1.5 cm cystic structure in the medial left upper back (series 2/image 6), with mild wall thickening and surrounding mild fat stranding, favor a sebaceous cyst. Review of the MIP images confirms the above findings. CTA ABDOMEN AND PELVIS FINDINGS Hepatobiliary: Mild-to-moderate diffuse hepatic steatosis. No liver mass. No definite liver surface irregularity. Mild gallbladder distention. Suggestion of mild diffuse gallbladder wall thickening and pericholecystic fat stranding, new since this morning. No radiopaque cholelithiasis. No biliary ductal dilatation. Pancreas: Normal, with no mass or duct dilation. Spleen: Normal size. No mass. Adrenals/Urinary Tract: Normal adrenals. Subcentimeter hypodense renal cortical lesions in the mid to lower right kidney are too small to characterize. Otherwise normal kidneys, with no hydronephrosis.  Normal bladder. Stomach/Bowel: Grossly normal stomach. Mild wall thickening in the descending duodenum with mild associated fat stranding surrounding the descending duodenum, which appears new. Normal caliber small bowel with no small bowel wall thickening. Normal appendix. Normal large bowel with no diverticulosis, large bowel wall thickening or pericolonic fat stranding. No pneumatosis. Vascular/Lymphatic: Mildly atherosclerotic nonaneurysmal abdominal aorta. No abdominal aortic dissection, pseudoaneurysm or penetrating atherosclerotic ulcer. Celiac artery, superior mesenteric artery, splenic artery, common hepatic artery, inferior mesenteric artery and bilateral single appearing renal arteries appear patent. Splenic and renal veins are patent. No pathologically enlarged lymph nodes in the abdomen or pelvis. Reproductive: Normal size prostate. Other: No pneumoperitoneum, ascites or focal fluid collection. Peripherally calcified circumscribed 4.4 x 4.2 cm cystic structure in the left anterior pelvis abutting the left external iliac vein (series 5/ image 167) is not appreciably changed in size since 09/18/2013, consistent with a benign extraperitoneal left pelvic cyst. Musculoskeletal: No aggressive appearing focal osseous lesions. Mild lumbar spondylosis. Review of the MIP images confirms the above findings. IMPRESSION: 1. No acute aortic syndrome.  Aortic atherosclerosis. 2. New gallbladder and descending duodenal wall thickening and new pericholecystic and peri-duodenal fat stranding. These findings are of uncertain etiology. Acute cholecystitis is a consideration. No radiopaque cholelithiasis. Acute infectious or inflammatory duodenitis or duodenal ulcer disease are considerations. Consider correlation with right upper quadrant abdominal sonogram. 3. No biliary ductal dilatation. 4. No pneumatosis or pneumoperitoneum.  No focal fluid collections. 5. Benign-appearing rim calcified extraperitoneal left anterior  pelvic cyst is stable since 2015. 6.  Three-vessel coronary atherosclerosis. 7. Mild-to-moderate diffuse hepatic steatosis. Electronically Signed   By: Ilona Sorrel M.D.   On: 07/27/2015 17:54   US Abdomen Limited Ruq  07/27/2015  CLINICAL DATA:  Right upper quadrant pain x1 day EXAM: US ABDOMEN LIMITED - RIGHT UPPER QUADRANT COMPARISON:  CT abdomen pelvis dated 07/27/2015 FINDINGS: Gallbladder: No gallstones, gallbladder wall thickening, or pericholecystic fluid. Common bile duct: Diameter: 3 mm Liver: Hyperechoic hepatic parenchyma, suggesting hepatic steatosis, with focal fatty sparing along the gallbladder fossa. IMPRESSION: Hepatic steatosis with focal fatty sparing. Electronically Signed   By: Julian Hy M.D.   On: 07/27/2015 18:57    Microbiology: Recent Results (from the past 240 hour(s))  Culture, blood (Routine X 2) w Reflex to ID Panel     Status: None (Preliminary result)   Collection Time: 07/28/15  6:52 AM  Result Value Ref Range Status   Specimen Description BLOOD LEFT ANTECUBITAL  Final   Special Requests BOTTLES DRAWN AEROBIC AND ANAEROBIC 10CC  Final   Culture   Final    NO GROWTH 1 DAY Performed at [facility]    Report Status PENDING  Incomplete  Culture, blood (Routine X 2) w Reflex to ID Panel     Status: None (Preliminary result)   Collection Time: 07/28/15  6:52 AM  Result Value Ref Range Status   Specimen Description BLOOD LEFT ARM  Final   Special Requests BOTTLES DRAWN AEROBIC AND ANAEROBIC 10CC  Final   Culture   Final    NO GROWTH 1 DAY Performed at [facility]    Report Status PENDING  Incomplete     Labs: Basic Metabolic Panel:  Recent Labs Lab 07/26/15 2249 07/27/15 1611 07/28/15 0317  NA 138 136 133*  K 4.1 3.9 4.7  CL 105 104 104  CO2 24 23 22   GLUCOSE 153* 108* 119*  BUN 19 17 16   CREATININE 1.30* 1.10 1.02  CALCIUM 8.6* 9.2 8.2*   Liver Function Tests:  Recent Labs Lab 07/26/15 2249 07/27/15 1611  07/28/15 0317  AST 29 27 24   ALT 31 30 27   ALKPHOS 53 54 47  BILITOT 0.5 1.0 1.2  PROT 6.9 7.5 6.6  ALBUMIN 4.1 4.5 3.8    Recent Labs Lab 07/26/15 2249 07/27/15 1611  LIPASE 27 23   No results for input(s): AMMONIA in the last 168 hours. CBC:  Recent Labs Lab 07/27/15 1406 07/27/15 1611 07/28/15 0316 07/29/15 0428 07/30/15 0319  WBC 23.0 Repeated and verified X2.* 19.0* 17.2* 13.6* 10.3  NEUTROABS 20.0* 15.9*  --   --   --   HGB 14.8 15.3 13.5 13.5 13.2  HCT 44.1 43.3 41.4 39.2 39.8  MCV 88.8 86.4 92.2 87.7 90.5  PLT 308.0 266 233 215 255   Cardiac Enzymes:  Recent Labs Lab 07/26/15 2249 07/27/15 0228  TROPONINI <0.03 <0.03   BNP: BNP (last 3 results) No results for input(s): BNP in the last 8760 hours.  ProBNP (last 3 results) No results for input(s): PROBNP in the last 8760 hours.  CBG: No results for input(s): GLUCAP in the last 168 hours.  Signed:  Velvet Bathe MD.  [facility] 07/30/2015, 12:14 PM

## 2015-08-01 ENCOUNTER — Telehealth: Payer: Self-pay

## 2015-08-01 NOTE — Telephone Encounter (Signed)
Post hospitalization for duodenitis. Patient has seen his PCP today. This evening he doesn't feel as well as he has. Temp is 100.5 where it has been normal. He reports good fluid intake and good urine output. No increase of pain. He will take Tylenol and push po fluids. If he acutely worsens he should go to the ER. Call back tomorrow with an up date.

## 2015-08-01 NOTE — Telephone Encounter (Signed)
Ok, please followup with pt tomorrow Will add Dr. Havery Moros as Juluis Rainier

## 2015-08-02 LAB — CULTURE, BLOOD (ROUTINE X 2)
CULTURE: NO GROWTH
Culture: NO GROWTH

## 2015-08-02 NOTE — Telephone Encounter (Signed)
Spoke with the patient. His temperature is normal. He feels better. He will call us if he acutely worsens. Keep the appointment 08/10/15.

## 2015-08-09 ENCOUNTER — Encounter: Payer: Self-pay | Admitting: Physician Assistant

## 2015-08-09 ENCOUNTER — Ambulatory Visit (INDEPENDENT_AMBULATORY_CARE_PROVIDER_SITE_OTHER): Payer: 59 | Admitting: Physician Assistant

## 2015-08-09 VITALS — BP 100/56 | HR 100 | Ht 69.0 in | Wt 241.1 lb

## 2015-08-09 DIAGNOSIS — R1084 Generalized abdominal pain: Secondary | ICD-10-CM | POA: Diagnosis not present

## 2015-08-09 DIAGNOSIS — Z86010 Personal history of colonic polyps: Secondary | ICD-10-CM | POA: Diagnosis not present

## 2015-08-09 DIAGNOSIS — R5081 Fever presenting with conditions classified elsewhere: Secondary | ICD-10-CM | POA: Diagnosis not present

## 2015-08-09 NOTE — Progress Notes (Signed)
Patient ID: [facility], male   DOB: 1960-04-24, 55 y.o.   MRN: [id number]   Subjective:    Patient ID: [facility], male    DOB: 10-07-1960, 55 y.o.   MRN: [id number]  HPI David [facility] is a very nice 55 year old white male recently known to Dr. Havery Hartman who had undergone screening colonoscopy on 07/26/2015 with removal of 3 small polyps, largest 5 mm, all removed with cold snare. Unfortunately later that evening he developed chest pain which quickly migrated to the abdomen and became severe. He was initially evaluated at the emergency room at [facility], had CT of the chest and abdomen both of which were negative. Labs were unremarkable and he was discharged to home but continued to feel bad. He was evaluated in our office on 07/27/2015, labs were repeated and WBC was 23,000 and he was admitted to the hospital. He also developed a temp over 102. He was started on broad-spectrum antibiotics, had repeat CT imaging of his abdomen and pelvis which was negative with the exception of mild duodenitis, and possible gallbladder wall thickening. Subsequent ultrasound was totally normal.. He was placed at bowel rest given IV narcotics, blood cultures were done and over the next 72 hours his symptoms gradually subsided. Diet was advanced which she tolerated without difficulty. Blood cultures eventually returned negative, but decision was made to discharge him home to complete 7 more days of Cipro and Flagyl. WBC had returned to normal the time of discharge. He comes in today for post hospital follow-up stating that all of his symptoms have resolved. He denies any residual abdominal pain, appetite is good bowel movements have been softer than usual no melena or hematochezia and he finished antibiotics earlier this week. He said he continued to have some low-grade fevers intermittently until last Thursday, 08/02/2015 and has had no more fever since.  Path from colon polyps revealed adenomatous  polyps Family history pertinent for father with colon cancer.   Review of Systems Pertinent positive and negative review of systems were noted in the above HPI section.  All other review of systems was otherwise negative.  Outpatient Encounter Prescriptions as of 08/09/2015  Medication Sig  . allopurinol (ZYLOPRIM) 300 MG tablet Take 300 mg by mouth daily.  Marland Kitchen ALPRAZolam (XANAX) 1 MG tablet Take 0.5 mg by mouth daily as needed for anxiety.   Marland Kitchen lisinopril (PRINIVIL,ZESTRIL) 40 MG tablet Take 1 tablet by mouth daily.  Marland Kitchen omeprazole (PRILOSEC OTC) 20 MG tablet Take 20 mg by mouth daily.  . simvastatin (ZOCOR) 40 MG tablet Take 40 mg by mouth every evening.  . [DISCONTINUED] ciprofloxacin (CIPRO) 500 MG tablet Take 1 tablet (500 mg total) by mouth 2 (two) times daily.  . [DISCONTINUED] colchicine 0.6 MG tablet Take 1 tablet (0.6 mg total) by mouth daily.  . [DISCONTINUED] metroNIDAZOLE (FLAGYL) 500 MG tablet Take 1 tablet (500 mg total) by mouth 3 (three) times daily.  . [DISCONTINUED] oxycodone (OXY-IR) 5 MG capsule Take 1 capsule (5 mg total) by mouth every 4 (four) hours as needed.  . [DISCONTINUED] Simethicone (GAS-X PO) Take 2 tablets by mouth daily as needed (gas).   No facility-administered encounter medications on file as of 08/09/2015.   No Known Allergies Patient Active Problem List   Diagnosis Date Noted  . Intractable abdominal pain 07/27/2015  . Leukocytosis 07/27/2015  . Anxiety 07/27/2015  . Duodenitis 07/27/2015  . Diffuse abdominal pain   . Ureteral calculus 01/30/2014  . Acute left flank pain 01/29/2014  .  Pain 01/29/2014  . UTI (urinary tract infection) 01/29/2014  . High cholesterol   . HTN (hypertension)   . Chest pain   . Shoulder pain   . Depression   . Erectile dysfunction   . GERD (gastroesophageal reflux disease)   . Hyperlipidemia   . Nephrolithiasis   . Prostate cancer ([place])   . Basal cell carcinoma   . Dysuria   . Gastroenteritis   . Insomnia   .  Onychomycosis    Social History   Social History  . Marital Status: Divorced    Spouse Name: N/A  . Number of Children: N/A  . Years of Education: N/A   Occupational History  . Not on file.   Social History Main Topics  . Smoking status: Never Smoker   . Smokeless tobacco: Never Used  . Alcohol Use: 0.0 oz/week    0 Standard drinks or equivalent per week     Comment: "a beer once in a while" ; 2 times monthly  . Drug Use: No  . Sexual Activity: Yes    Birth Control/ Protection: Condom   Other Topics Concern  . Not on file   Social History Narrative    [facility]'s family history includes Colon cancer in his mother; Heart attack in his father; Stroke in his father. There is no history of Esophageal cancer, Pancreatic cancer, Prostate cancer, Rectal cancer, or Stomach cancer.      Objective:    Filed Vitals:   08/09/15 1448  BP: 100/56  Pulse: 100    Physical Exam  well-developed white male in no acute distress, pleasant blood pressure 100/56, pulse 90, HEENT ;nontraumatic normocephalic EOMI PERRLA sclera anicteric, Cardiovascular; regular rate and rhythm with S1-S2 no murmur or gallop, Pulmonary; clear bilaterally, Abdomen; obese soft bowel sounds are present there is no abdominal tenderness, no palpable mass or hepatosplenomegaly, Rectal; exam not done, Ext;no clubbing cyanosis or edema skin warm and dry, Neuropsych;mood and affect appropriate     Assessment & Plan:   #12 55 year old white male with acute severe generalized abdominal pain fever and leukocytosis, abrupt onset within 24 hours of colonoscopy which was uneventful with polypectomies done by cold snare. Patient required hospitalization, no evidence of perforation and etiology at this time unclear. Blood cultures negative. He appeared to have a bacteremia of some sort which resolved with IV antibiotics. #2 adenomatous colon polyps #3 family history of colon cancer #4 chronic GERD  Plan; observation,  patient advised to call immediately should he have any problems He will need follow-up colonoscopy in 5 years.  David Mccamy S Tyrice Hewitt PA-C 08/09/2015   Cc: David Carnes, PA-C

## 2015-08-09 NOTE — Patient Instructions (Signed)
We made you an appointment with Dr. Havery Moros for 09-21-2015 Friday at 3:45 PM.

## 2015-08-09 NOTE — Progress Notes (Signed)
Agree with assessment and plan. Unclear etiology of what drove this process but he resolved with conservative therapy / antibiotics. Given the location of his symptoms, diffuse and lower abdominal pain, duodenitis may have been a red herring and unclear if related at all to his presentation, was not seen on his initial CT when in pain. Glad to hear he is back to normal, if he has any recurrence of symptoms he should contact us for reassessment.

## 2015-08-10 ENCOUNTER — Ambulatory Visit: Payer: 59 | Admitting: Gastroenterology

## 2015-08-23 IMAGING — CT CT ABD-PELV W/O CM
2 of 4 series · 17 of 46 positions shown, 19 images · non-contrast
Comparison: 06/02/2013 radiographs

CLINICAL DATA: 53-year-old male with right flank and abdominal
pain. History urinary calculi.

EXAM:
CT ABDOMEN AND PELVIS WITHOUT CONTRAST
TECHNIQUE: Multidetector CT imaging of the abdomen and pelvis was performed
following the standard protocol without IV contrast.

[Series 2: standard/full over (age)lbs 5.0 · axial · 0.89mm/px · z∈[-521,-56]mm · 14 of 103 slices shown, 16 images]
[im 5/103  soft-tissue]
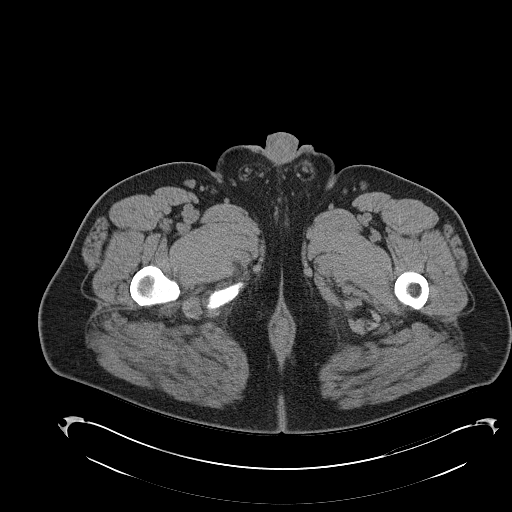
[im 5/103  bone]
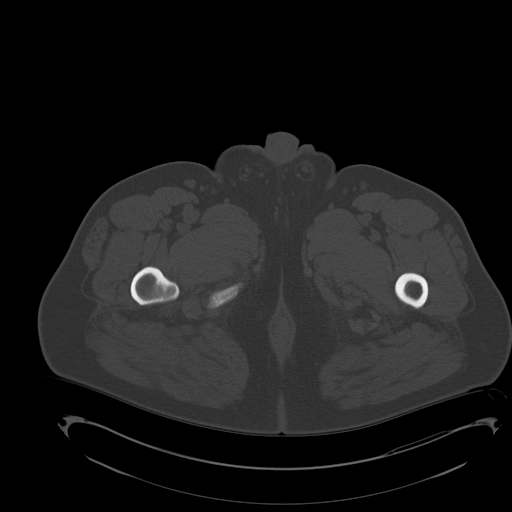
[im 14/103  soft-tissue]
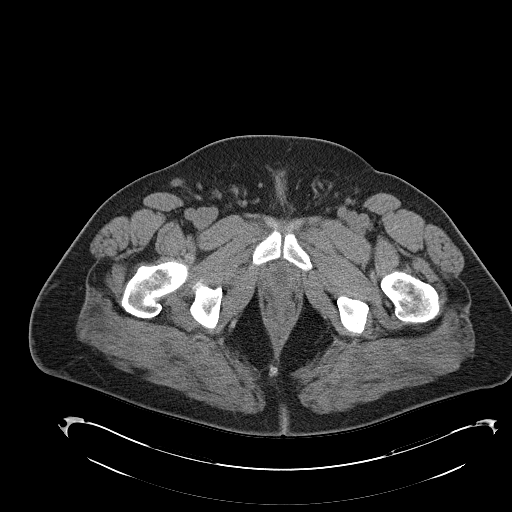
[im 19/103  soft-tissue]
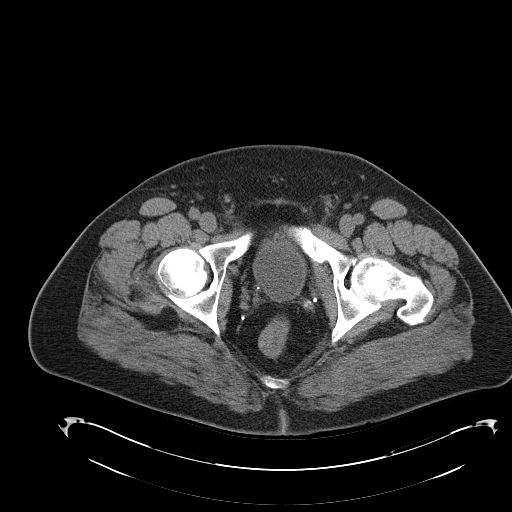
[im 28/103  soft-tissue]
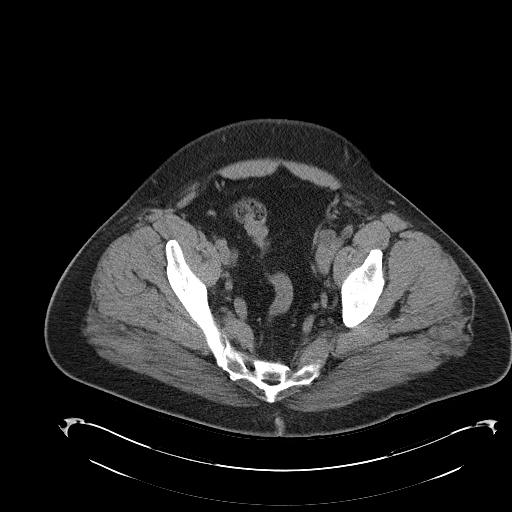
[im 33/103  soft-tissue]
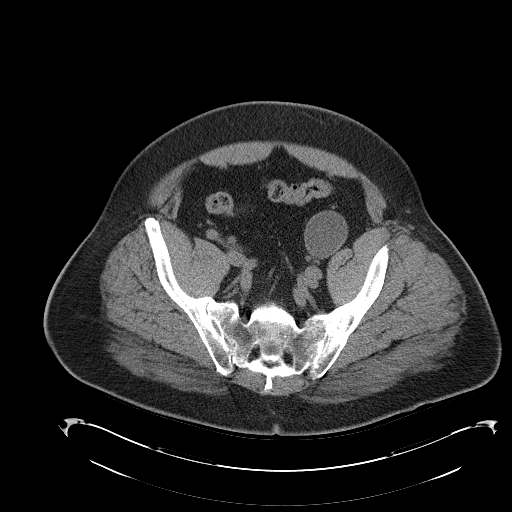
[im 42/103  soft-tissue]
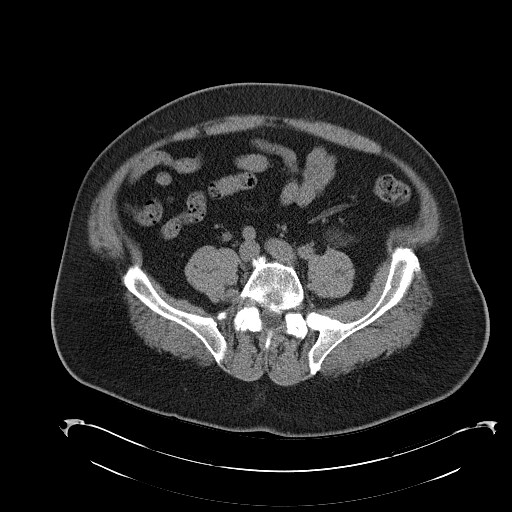
[im 47/103  soft-tissue]
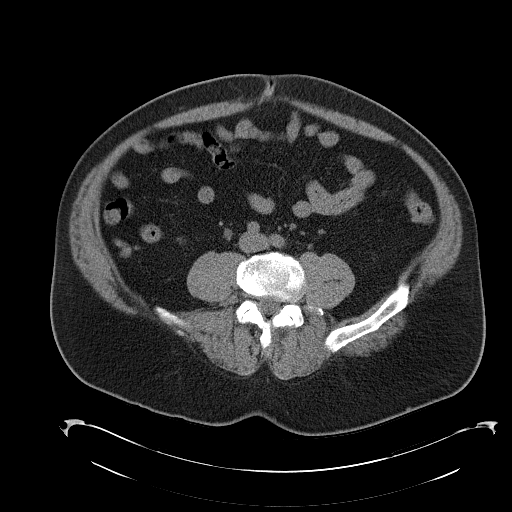
[im 56/103  soft-tissue]
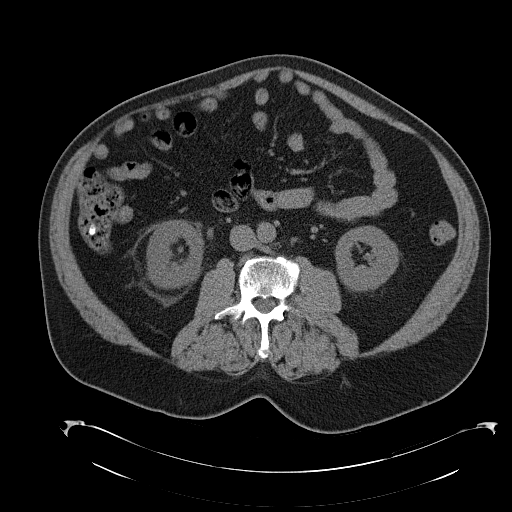
[im 61/103  soft-tissue]
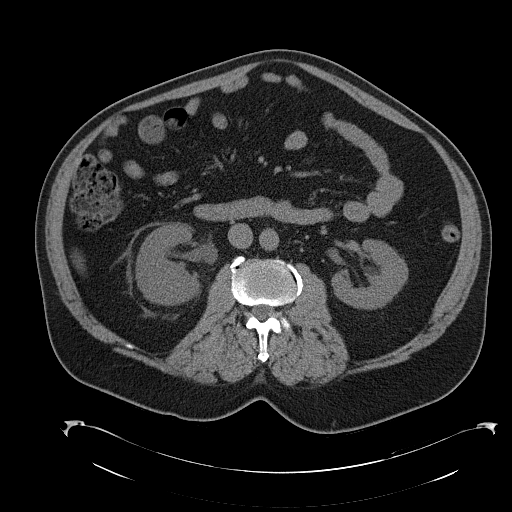
[im 61/103  bone]
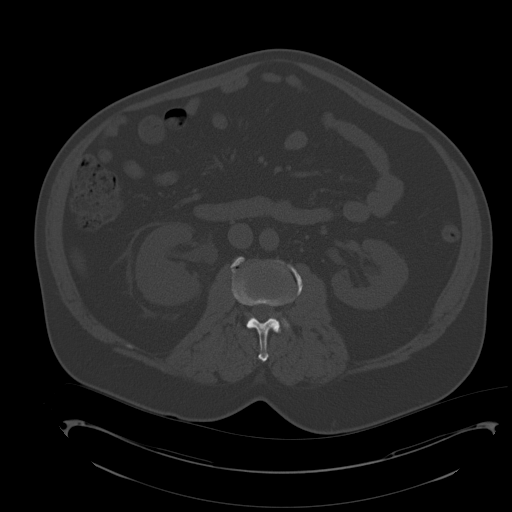
[im 70/103  soft-tissue]
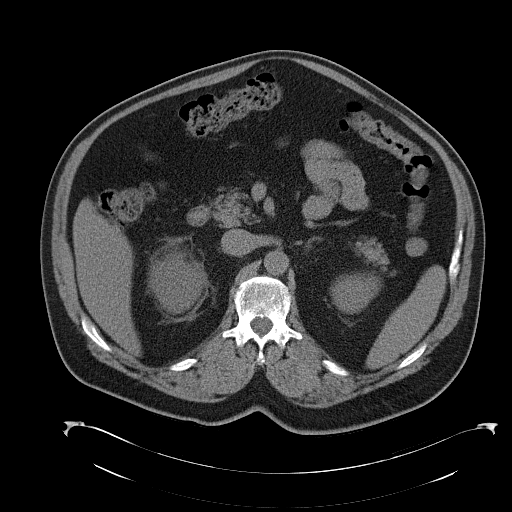
[im 75/103  soft-tissue]
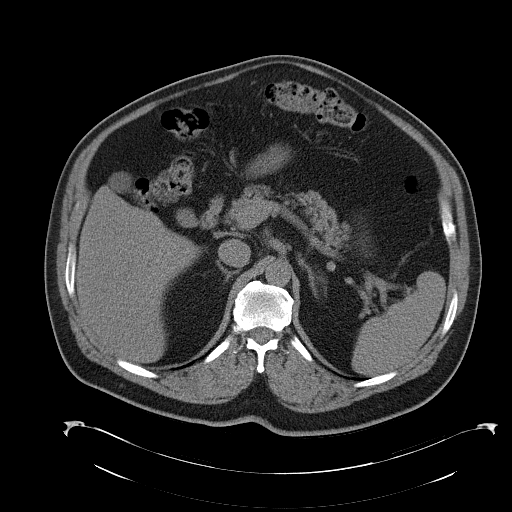
[im 84/103  soft-tissue]
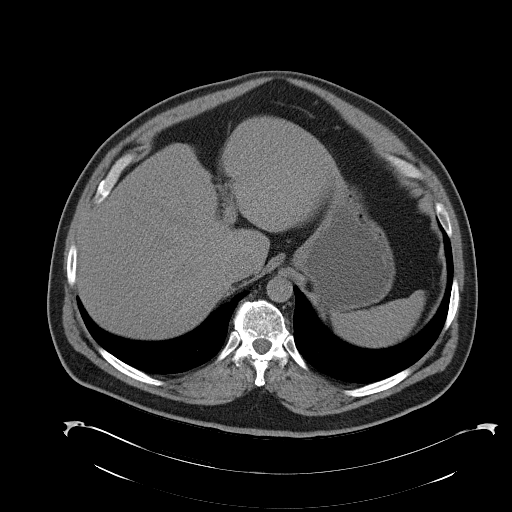
[im 89/103  soft-tissue]
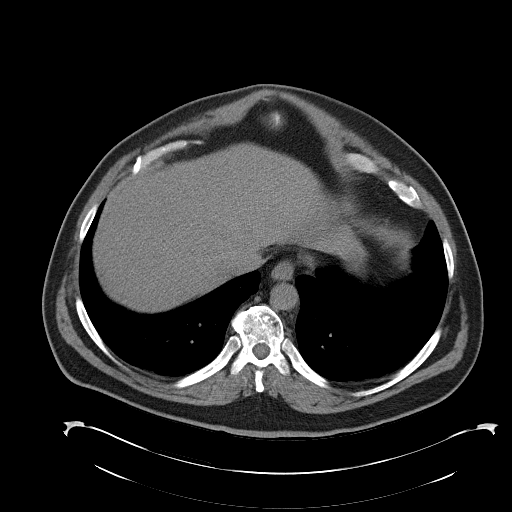
[im 98/103  soft-tissue]
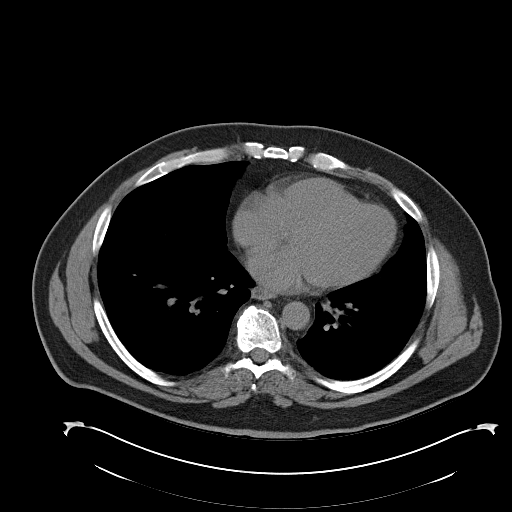

[Series 3: mpr coronal · coronal · 1.00mm/px · 3 of 114 slices shown]
[im 38/114  soft-tissue]
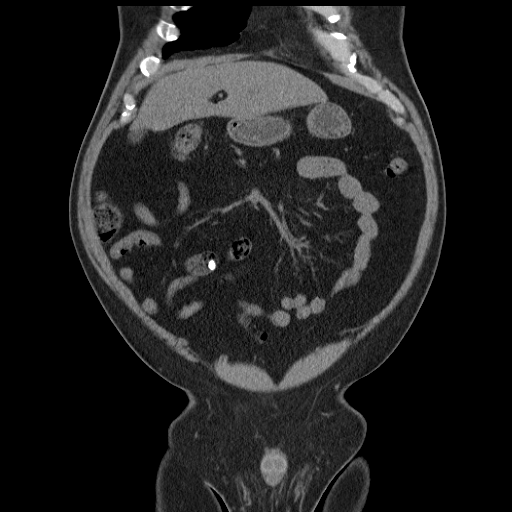
[im 51/114  soft-tissue]
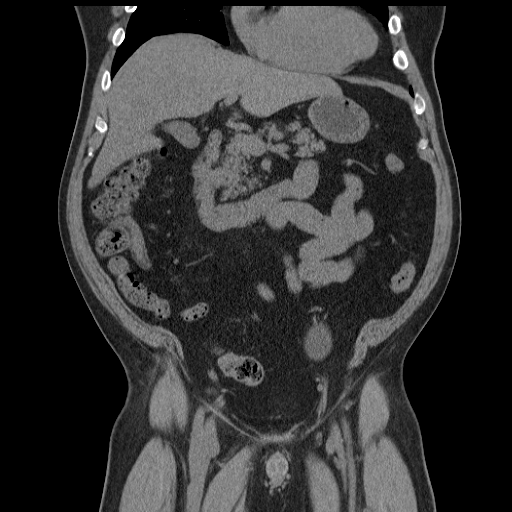
[im 63/114  soft-tissue]
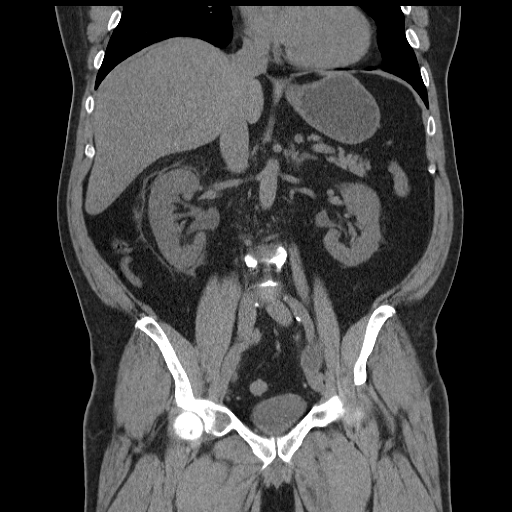

[17 of 46 positions shown; findings below may reference images not displayed]

FINDINGS: A 2 mm right UVJ calculus causes mild to moderate right
hydroureteronephrosis and right perinephric inflammation.

At least 3 punctate nonobstructing left renal calculi are
identified.

Mild fatty infiltration of the liver is identified.

The spleen, adrenal glands, pancreas, and gallbladder are
unremarkable.

Please note that parenchymal abnormalities may be missed without
intravenous contrast.

There is no evidence of free fluid, enlarged lymph nodes, biliary
dilation or abdominal aortic aneurysm.

High density structures/calcifications within the appendix noted
without evidence of appendicitis.

There is no evidence of bowel obstruction, bowel wall thickening,
pneumoperitoneum or suspicious collection.

A 3.8 x 4.5 x 7.2 cm unilocular appearing cyst in lateral left
pelvis contains faint rim calcification and is not adjacent to
bowel. This probably represents a benign process such as a prior
hematoma, lymphocele or duplication cyst.

No acute or suspicious bony abnormalities are identified. Mild
degenerative disc disease and spondylosis at L5-S1 noted.
IMPRESSION: 2 mm right UVJ calculus causing mild to moderate right
hydroureteronephrosis.

Punctate nonobstructing left renal calculi.

Mild fatty infiltration of the liver.

3.8 x 4.5 x 7.2 cm likely benign unilocular appearing left pelvic
cystic structure.

## 2015-09-10 DIAGNOSIS — Z9189 Other specified personal risk factors, not elsewhere classified: Secondary | ICD-10-CM | POA: Insufficient documentation

## 2015-09-13 DIAGNOSIS — S27321A Contusion of lung, unilateral, initial encounter: Secondary | ICD-10-CM | POA: Insufficient documentation

## 2015-09-13 DIAGNOSIS — S2222XA Fracture of body of sternum, initial encounter for closed fracture: Secondary | ICD-10-CM | POA: Insufficient documentation

## 2015-09-13 DIAGNOSIS — S22009A Unspecified fracture of unspecified thoracic vertebra, initial encounter for closed fracture: Secondary | ICD-10-CM | POA: Insufficient documentation

## 2015-09-13 DIAGNOSIS — J9383 Other pneumothorax: Secondary | ICD-10-CM | POA: Insufficient documentation

## 2015-09-13 DIAGNOSIS — S61401A Unspecified open wound of right hand, initial encounter: Secondary | ICD-10-CM | POA: Insufficient documentation

## 2015-09-13 DIAGNOSIS — S32009A Unspecified fracture of unspecified lumbar vertebra, initial encounter for closed fracture: Secondary | ICD-10-CM | POA: Insufficient documentation

## 2015-09-13 DIAGNOSIS — S2241XA Multiple fractures of ribs, right side, initial encounter for closed fracture: Secondary | ICD-10-CM | POA: Insufficient documentation
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
## 2015-09-21 ENCOUNTER — Inpatient Hospital Stay (HOSPITAL_COMMUNITY)
Admission: RE | Admit: 2015-09-21 | Discharge: 2015-09-28 | DRG: 560 | Disposition: A | Discharge status: Home / Self Care | Payer: Worker's Compensation | Source: Other Acute Inpatient Hospital | Attending: Physical Medicine & Rehabilitation | Admitting: Physical Medicine & Rehabilitation

## 2015-09-21 ENCOUNTER — Other Ambulatory Visit (HOSPITAL_COMMUNITY): Payer: Self-pay | Admitting: Physical Medicine and Rehabilitation

## 2015-09-21 ENCOUNTER — Inpatient Hospital Stay (HOSPITAL_COMMUNITY): Payer: Worker's Compensation

## 2015-09-21 ENCOUNTER — Ambulatory Visit: Payer: 59 | Admitting: Gastroenterology

## 2015-09-21 DIAGNOSIS — M109 Gout, unspecified: Secondary | ICD-10-CM | POA: Diagnosis present

## 2015-09-21 DIAGNOSIS — I10 Essential (primary) hypertension: Secondary | ICD-10-CM | POA: Diagnosis present

## 2015-09-21 DIAGNOSIS — E871 Hypo-osmolality and hyponatremia: Secondary | ICD-10-CM | POA: Diagnosis not present

## 2015-09-21 DIAGNOSIS — N179 Acute kidney failure, unspecified: Secondary | ICD-10-CM | POA: Diagnosis present

## 2015-09-21 DIAGNOSIS — S22029D Unspecified fracture of second thoracic vertebra, subsequent encounter for fracture with routine healing: Secondary | ICD-10-CM

## 2015-09-21 DIAGNOSIS — Z8546 Personal history of malignant neoplasm of prostate: Secondary | ICD-10-CM | POA: Diagnosis not present

## 2015-09-21 DIAGNOSIS — T1490XA Injury, unspecified, initial encounter: Secondary | ICD-10-CM

## 2015-09-21 DIAGNOSIS — D72829 Elevated white blood cell count, unspecified: Secondary | ICD-10-CM

## 2015-09-21 DIAGNOSIS — S2220XD Unspecified fracture of sternum, subsequent encounter for fracture with routine healing: Secondary | ICD-10-CM

## 2015-09-21 DIAGNOSIS — S62309D Unspecified fracture of unspecified metacarpal bone, subsequent encounter for fracture with routine healing: Secondary | ICD-10-CM

## 2015-09-21 DIAGNOSIS — J9811 Atelectasis: Secondary | ICD-10-CM | POA: Diagnosis present

## 2015-09-21 DIAGNOSIS — K219 Gastro-esophageal reflux disease without esophagitis: Secondary | ICD-10-CM | POA: Diagnosis present

## 2015-09-21 DIAGNOSIS — R7989 Other specified abnormal findings of blood chemistry: Secondary | ICD-10-CM | POA: Diagnosis present

## 2015-09-21 DIAGNOSIS — K5901 Slow transit constipation: Secondary | ICD-10-CM | POA: Diagnosis present

## 2015-09-21 DIAGNOSIS — S01311D Laceration without foreign body of right ear, subsequent encounter: Secondary | ICD-10-CM | POA: Diagnosis not present

## 2015-09-21 DIAGNOSIS — G8918 Other acute postprocedural pain: Secondary | ICD-10-CM | POA: Diagnosis not present

## 2015-09-21 DIAGNOSIS — S66329D Laceration of extensor muscle, fascia and tendon of unspecified finger at wrist and hand level, subsequent encounter: Secondary | ICD-10-CM

## 2015-09-21 DIAGNOSIS — S22039D Unspecified fracture of third thoracic vertebra, subsequent encounter for fracture with routine healing: Secondary | ICD-10-CM | POA: Diagnosis not present

## 2015-09-21 DIAGNOSIS — S22019D Unspecified fracture of first thoracic vertebra, subsequent encounter for fracture with routine healing: Secondary | ICD-10-CM | POA: Diagnosis not present

## 2015-09-21 DIAGNOSIS — S52511D Displaced fracture of right radial styloid process, subsequent encounter for closed fracture with routine healing: Secondary | ICD-10-CM

## 2015-09-21 DIAGNOSIS — S22049D Unspecified fracture of fourth thoracic vertebra, subsequent encounter for fracture with routine healing: Secondary | ICD-10-CM

## 2015-09-21 DIAGNOSIS — H9191 Unspecified hearing loss, right ear: Secondary | ICD-10-CM | POA: Diagnosis present

## 2015-09-21 DIAGNOSIS — M1711 Unilateral primary osteoarthritis, right knee: Secondary | ICD-10-CM | POA: Diagnosis present

## 2015-09-21 DIAGNOSIS — S2241XD Multiple fractures of ribs, right side, subsequent encounter for fracture with routine healing: Secondary | ICD-10-CM | POA: Diagnosis present

## 2015-09-21 DIAGNOSIS — E785 Hyperlipidemia, unspecified: Secondary | ICD-10-CM | POA: Diagnosis present

## 2015-09-21 DIAGNOSIS — T1490 Injury, unspecified: Secondary | ICD-10-CM | POA: Diagnosis not present

## 2015-09-21 LAB — URINALYSIS, ROUTINE W REFLEX MICROSCOPIC
Bilirubin Urine: NEGATIVE
GLUCOSE, UA: NEGATIVE mg/dL
Ketones, ur: NEGATIVE mg/dL
Leukocytes, UA: NEGATIVE
Nitrite: NEGATIVE
PH: 6.5 (ref 5.0–8.0)
Protein, ur: NEGATIVE mg/dL
SPECIFIC GRAVITY, URINE: 1.025 (ref 1.005–1.030)

## 2015-09-21 LAB — URINE MICROSCOPIC-ADD ON

## 2015-09-21 MED ORDER — ALLOPURINOL 100 MG PO TABS
300.0000 mg | ORAL_TABLET | Freq: Every day | ORAL | Status: DC
  Administered 2015-09-22 – 2015-09-28 (×7): 300 mg via ORAL
  Filled 2015-09-21 (×7): qty 3

## 2015-09-21 MED ORDER — SENNOSIDES-DOCUSATE SODIUM 8.6-50 MG PO TABS
1.0000 | ORAL_TABLET | Freq: Every day | ORAL | Status: DC
  Administered 2015-09-21 – 2015-09-24 (×4): 1 via ORAL
  Filled 2015-09-21 (×6): qty 1

## 2015-09-21 MED ORDER — PROCHLORPERAZINE MALEATE 5 MG PO TABS: 5.0000 mg | ORAL_TABLET | Freq: Four times a day (QID) | ORAL | Status: DC | PRN

## 2015-09-21 MED ORDER — LIDOCAINE 5 % EX PTCH
2.0000 | MEDICATED_PATCH | CUTANEOUS | Status: DC
  Administered 2015-09-22 – 2015-09-28 (×7): 2 via TRANSDERMAL
  Filled 2015-09-21 (×7): qty 2

## 2015-09-21 MED ORDER — POLYETHYLENE GLYCOL 3350 17 G PO PACK
17.0000 g | PACK | Freq: Every day | ORAL | Status: DC
  Administered 2015-09-21 – 2015-09-27 (×7): 17 g via ORAL
  Filled 2015-09-21 (×7): qty 1

## 2015-09-21 MED ORDER — FLEET ENEMA 7-19 GM/118ML RE ENEM: 1.0000 | ENEMA | Freq: Once | RECTAL | Status: DC | PRN

## 2015-09-21 MED ORDER — GUAIFENESIN-DM 100-10 MG/5ML PO SYRP
5.0000 mL | ORAL_SOLUTION | Freq: Four times a day (QID) | ORAL | Status: DC | PRN
Start: 2015-09-21 — End: 2015-09-28

## 2015-09-21 MED ORDER — PROCHLORPERAZINE 25 MG RE SUPP: 12.5000 mg | Freq: Four times a day (QID) | RECTAL | Status: DC | PRN

## 2015-09-21 MED ORDER — TRAZODONE HCL 50 MG PO TABS: 25.0000 mg | ORAL_TABLET | Freq: Every evening | ORAL | Status: DC | PRN

## 2015-09-21 MED ORDER — ENOXAPARIN SODIUM 30 MG/0.3ML ~~LOC~~ SOLN
30.0000 mg | Freq: Two times a day (BID) | SUBCUTANEOUS | Status: DC
  Administered 2015-09-22 – 2015-09-28 (×13): 30 mg via SUBCUTANEOUS
  Filled 2015-09-21 (×13): qty 0.3

## 2015-09-21 MED ORDER — GABAPENTIN 100 MG PO CAPS
100.0000 mg | ORAL_CAPSULE | Freq: Three times a day (TID) | ORAL | Status: DC
  Administered 2015-09-21 – 2015-09-26 (×15): 100 mg via ORAL
  Filled 2015-09-21 (×15): qty 1

## 2015-09-21 MED ORDER — ALUM & MAG HYDROXIDE-SIMETH 200-200-20 MG/5ML PO SUSP: 30.0000 mL | ORAL | Status: DC | PRN

## 2015-09-21 MED ORDER — BISACODYL 10 MG RE SUPP: 10.0000 mg | Freq: Every day | RECTAL | Status: DC | PRN

## 2015-09-21 MED ORDER — SIMVASTATIN 40 MG PO TABS
40.0000 mg | ORAL_TABLET | Freq: Every day | ORAL | Status: DC
  Administered 2015-09-21 – 2015-09-27 (×7): 40 mg via ORAL
  Filled 2015-09-21 (×7): qty 1

## 2015-09-21 MED ORDER — CIPROFLOXACIN-DEXAMETHASONE 0.3-0.1 % OT SUSP
4.0000 [drp] | Freq: Two times a day (BID) | OTIC | Status: AC
Start: 2015-09-21 — End: 2015-09-25
  Administered 2015-09-21 – 2015-09-25 (×8): 4 [drp] via OTIC
  Filled 2015-09-21: qty 7.5

## 2015-09-21 MED ORDER — OXYCODONE HCL 5 MG PO TABS
10.0000 mg | ORAL_TABLET | ORAL | Status: DC | PRN
  Administered 2015-09-21 (×2): 10 mg via ORAL
  Filled 2015-09-21 (×2): qty 2

## 2015-09-21 MED ORDER — LISINOPRIL 40 MG PO TABS
40.0000 mg | ORAL_TABLET | Freq: Every day | ORAL | Status: DC
  Administered 2015-09-22 – 2015-09-28 (×7): 40 mg via ORAL
  Filled 2015-09-21 (×7): qty 1

## 2015-09-21 MED ORDER — AMOXICILLIN-POT CLAVULANATE 500-125 MG PO TABS
1.0000 | ORAL_TABLET | Freq: Three times a day (TID) | ORAL | Status: AC
  Administered 2015-09-21: 500 mg via ORAL
  Filled 2015-09-21: qty 1

## 2015-09-21 MED ORDER — DIPHENHYDRAMINE HCL 12.5 MG/5ML PO ELIX: 12.5000 mg | ORAL_SOLUTION | Freq: Four times a day (QID) | ORAL | Status: DC | PRN

## 2015-09-21 MED ORDER — PROCHLORPERAZINE EDISYLATE 5 MG/ML IJ SOLN: 5.0000 mg | Freq: Four times a day (QID) | INTRAMUSCULAR | Status: DC | PRN

## 2015-09-21 MED ORDER — PANTOPRAZOLE SODIUM 40 MG PO TBEC
40.0000 mg | DELAYED_RELEASE_TABLET | Freq: Every day | ORAL | Status: DC
  Administered 2015-09-22 – 2015-09-28 (×7): 40 mg via ORAL
  Filled 2015-09-21 (×7): qty 1

## 2015-09-21 MED ORDER — ENOXAPARIN SODIUM 30 MG/0.3ML ~~LOC~~ SOLN
30.0000 mg | Freq: Two times a day (BID) | SUBCUTANEOUS | Status: DC
  Administered 2015-09-21: 30 mg via SUBCUTANEOUS
  Filled 2015-09-21: qty 0.3

## 2015-09-21 MED ORDER — OXYCODONE HCL 5 MG PO TABS
15.0000 mg | ORAL_TABLET | ORAL | Status: DC | PRN
  Administered 2015-09-22 – 2015-09-25 (×17): 15 mg via ORAL
  Filled 2015-09-21 (×17): qty 3

## 2015-09-21 MED ORDER — TRAMADOL HCL 50 MG PO TABS
50.0000 mg | ORAL_TABLET | Freq: Four times a day (QID) | ORAL | Status: DC | PRN
  Administered 2015-09-22 – 2015-09-25 (×5): 50 mg via ORAL
  Filled 2015-09-21 (×7): qty 1

## 2015-09-21 MED ORDER — CLONIDINE HCL 0.1 MG PO TABS
0.1000 mg | ORAL_TABLET | Freq: Three times a day (TID) | ORAL | Status: DC
  Administered 2015-09-21 – 2015-09-28 (×21): 0.1 mg via ORAL
  Filled 2015-09-21 (×21): qty 1

## 2015-09-21 MED ORDER — ACETAMINOPHEN 325 MG PO TABS
325.0000 mg | ORAL_TABLET | ORAL | Status: DC | PRN
  Administered 2015-09-26 – 2015-09-27 (×3): 650 mg via ORAL
  Filled 2015-09-21 (×3): qty 2

## 2015-09-21 MED ORDER — TIZANIDINE HCL 2 MG PO TABS
2.0000 mg | ORAL_TABLET | Freq: Three times a day (TID) | ORAL | Status: DC | PRN
  Administered 2015-09-22 – 2015-09-26 (×8): 2 mg via ORAL
  Filled 2015-09-21 (×9): qty 1

## 2015-09-21 NOTE — Progress Notes (Signed)
Pt admitted to Rehab from [facility] and [facility] x 4. [facility] to Rehab expectations, and therapy scheduled. Diagnostic specific information provided.
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
## 2015-09-22 ENCOUNTER — Inpatient Hospital Stay (HOSPITAL_COMMUNITY): Payer: 59 | Admitting: Occupational Therapy

## 2015-09-22 ENCOUNTER — Inpatient Hospital Stay (HOSPITAL_COMMUNITY): Payer: Worker's Compensation | Admitting: *Deleted

## 2015-09-22 DIAGNOSIS — I10 Essential (primary) hypertension: Secondary | ICD-10-CM

## 2015-09-22 DIAGNOSIS — D72829 Elevated white blood cell count, unspecified: Secondary | ICD-10-CM

## 2015-09-22 LAB — COMPREHENSIVE METABOLIC PANEL
ALT: 30 U/L (ref 17–63)
AST: 21 U/L (ref 15–41)
Albumin: 3.1 g/dL — ABNORMAL LOW (ref 3.5–5.0)
Alkaline Phosphatase: 136 U/L — ABNORMAL HIGH (ref 38–126)
Anion gap: 9 (ref 5–15)
BUN: 23 mg/dL — AB (ref 6–20)
CHLORIDE: 101 mmol/L (ref 101–111)
CO2: 26 mmol/L (ref 22–32)
CREATININE: 0.97 mg/dL (ref 0.61–1.24)
Calcium: 9.1 mg/dL (ref 8.9–10.3)
GFR calc Af Amer: >60 mL/min (ref >60)
Glucose, Bld: 113 mg/dL — ABNORMAL HIGH (ref 65–99)
Potassium: 4.7 mmol/L (ref 3.5–5.1)
Sodium: 136 mmol/L (ref 135–145)
Total Bilirubin: 0.8 mg/dL (ref 0.3–1.2)
Total Protein: 6.4 g/dL — ABNORMAL LOW (ref 6.5–8.1)

## 2015-09-22 LAB — CBC WITH DIFFERENTIAL/PLATELET
BASOS ABS: 0 10*3/uL (ref 0.0–0.1)
Basophils Relative: 0 %
EOS PCT: 3 %
Eosinophils Absolute: 0.4 10*3/uL (ref 0.0–0.7)
HCT: 34.5 % — ABNORMAL LOW (ref 39.0–52.0)
HEMOGLOBIN: 11.2 g/dL — AB (ref 13.0–17.0)
LYMPHS PCT: 11 %
Lymphs Abs: 1.5 10*3/uL (ref 0.7–4.0)
MCH: 29.9 pg (ref 26.0–34.0)
MCHC: 32.5 g/dL (ref 30.0–36.0)
MCV: 92.2 fL (ref 78.0–100.0)
Monocytes Absolute: 1 10*3/uL (ref 0.1–1.0)
Monocytes Relative: 7 %
NEUTROS ABS: 10.4 10*3/uL — AB (ref 1.7–7.7)
NEUTROS PCT: 79 %
PLATELETS: 327 10*3/uL (ref 150–400)
RBC: 3.74 MIL/uL — AB (ref 4.22–5.81)
RDW: 13.6 % (ref 11.5–15.5)
WBC: 13.2 10*3/uL — AB (ref 4.0–10.5)

## 2015-09-22 MED ORDER — ENSURE ENLIVE PO LIQD
237.0000 mL | Freq: Two times a day (BID) | ORAL | Status: DC
  Administered 2015-09-22 – 2015-09-28 (×12): 237 mL via ORAL
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
## 2015-09-23 ENCOUNTER — Inpatient Hospital Stay (HOSPITAL_COMMUNITY): Payer: 59 | Admitting: Occupational Therapy

## 2015-09-23 DIAGNOSIS — T1490 Injury, unspecified: Secondary | ICD-10-CM

## 2015-09-23 LAB — URINE CULTURE: Culture: NO GROWTH

## 2015-09-23 NOTE — Progress Notes (Signed)
Occupational Therapy Session Note  Patient Details  Name: David Hartman MRN: [id number] Date of Birth: 03-26-1960  Today's Date: 09/23/2015 OT Individual Time: [id number] OT Individual Time Calculation (min): 63 min     Short Term Goals: Week 1:  OT Short Term Goal 1 (Week 1): Pt will complete LB dressing with Mod A and AE OT Short Term Goal 2 (Week 1): Pts caregiver will don back orthosis with supervision  OT Short Term Goal 3 (Week 1): Pt will complete toilet transfer with supervision  OT Short Term Goal 4 (Week 1): Pt will complete bathing with Min A and use of AE  Skilled Therapeutic Interventions/Progress Updates:   Pt participated in skilled OT tx focusing on activity tolerance, adherence to precautions, standing endurance, and appropriate use of AE during self care completion. With RN consultation, pt was agreeable to shower today. Functional transfer to tub bench completed with hemi walker and steady assist with TLSO. R UE and R LE covered including CAM boot. Bathing completed with Min A for back with use of LH sponge and instruction on adherence to back and sternal precautions via lateral leaning. Afterwards pt completed UB dressing including orthosis while seated on tub bench. LB dressing completed with use of AE. Pt required assistance for threading R LE into underwear and pants due to CAM boot. At end of session pt was left in recliner with all needs wtihin reach and R UE elevated in foam support. No c/o pain with pt medicated prior to session.   Therapy Documentation Precautions:  Precautions Precautions: Fall, Back, Sternal Precaution Comments: Back precautions with LSO; Non-weight bearing RUE, Weight bearing as toelerated RLL with CAM Boot Required Braces or Orthoses: Spinal Brace Spinal Brace: Other (comment) (wear during ambulation and for comfort) Restrictions Weight Bearing Restrictions: Yes RUE Weight Bearing: Non weight bearing RLE Weight Bearing: Weight bearing as  tolerated Other Position/Activity Restrictions: Back and sternal precautions  General:   Vital Signs: Therapy Vitals BP: 117/63   ADL: ADL ADL Comments: Please see functional navigator for ADL status     See Function Navigator for Current Functional Status.   Therapy/Group: Individual Therapy  [name] [name] 09/23/2015, 12:52 PM
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
## 2015-09-24 ENCOUNTER — Inpatient Hospital Stay (HOSPITAL_COMMUNITY): Payer: Worker's Compensation | Admitting: Physical Therapy

## 2015-09-24 ENCOUNTER — Inpatient Hospital Stay (HOSPITAL_COMMUNITY): Payer: 59 | Admitting: Occupational Therapy

## 2015-09-24 DIAGNOSIS — K5901 Slow transit constipation: Secondary | ICD-10-CM | POA: Diagnosis present

## 2015-09-24 DIAGNOSIS — N179 Acute kidney failure, unspecified: Secondary | ICD-10-CM | POA: Diagnosis present
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
## 2015-09-24 NOTE — Progress Notes (Signed)
Physical Therapy Session Note  Patient Details  Name: David Hartman MRN: [id number] Date of Birth: 1960/05/24  Today's Date: 09/24/2015 PT Individual Time: 0905-1000 PT Individual Time Calculation (min): 55 min    Short Term Goals: Week 1:  PT Short Term Goal 1 (Week 1): STG+=LTG  Skilled Therapeutic Interventions/Progress Updates:    Pt received resting in w/c with 6/10 pain, but RN in to medicate as PT entering, pt agreeable to therapy session.  Session focus on gait training with hemiwalker, sit<>stand focus on decreased use of LUE, stair negotiation, and bed mobility in simulated home set up.  Pt transitions sit<>stand throughout session with hemiwalker and supervision.  PT instructed pt in 2x5 sit<>stand without device focus on forward weight shift and no UE support.  Gait training x190' + 40' with hemiwalker and distant supervision.  Gait training on compliant surface x10' with hemiwalker and supervision/steady assist.  PT instructed pt in bed mobility in therapy apartment on flat bed with no rail with supervision and verbal cues for safety.  W/C propulsion back to room with BLEs and LUE.  PT applied ice to pt's R ankle for pain and swelling.  Pt left upright in w/c at end of session with call bell in reach and needs met.   Therapy Documentation Precautions:  Precautions Precautions: Fall, Back, Sternal Precaution Comments: Back precautions with LSO; Non-weight bearing RUE, Weight bearing as toelerated RLL with CAM Boot Required Braces or Orthoses: Spinal Brace Spinal Brace: Other (comment) (wear during ambulation and for comfort) Restrictions Weight Bearing Restrictions: Yes RUE Weight Bearing: Non weight bearing RLE Weight Bearing: Weight bearing as tolerated Other Position/Activity Restrictions: Back and sternal precautions    See Function Navigator for Current Functional Status.   Therapy/Group: Individual Therapy  Talen Poser E Penven-Crew 09/24/2015, 10:04 AM

## 2015-09-24 NOTE — Progress Notes (Signed)
Physical Therapy Session Note  Patient Details  Name: David Hartman MRN: [id number] Date of Birth: 1960/09/04  Today's Date: 09/24/2015 PT Individual Time: 1300-1358 PT Individual Time Calculation (min): 58 min    Short Term Goals: Week 1:  PT Short Term Goal 1 (Week 1): STG+=LTG  Skilled Therapeutic Interventions/Progress Updates:    Pt received resting in recliner with c/o 5/10 pain (RN in to medicate), agreeable to therapy session.  Session focus on activity tolerance, gait in controlled and simulated home environment, balance, and LE mobility.  Pt amb to therapy gym with hemiwalker and distant supervision, attempted to amb without hemiwalker but pt reports it is too painful.  Gait training through obstacle course to simulate home environment with increased time and supervision.  PT assessed pt's R ankle for edema and ecchymosis but noted to have minimal amounts of each.  AROM ankle DF/PF and SLR (bilaterally) x15 reps for continued management of edema and pain.  Pt propelled w/c back to room at end of session and transferred to recliner with supervision.  Call bell in reach and needs met.   Therapy Documentation Precautions:  Precautions Precautions: Fall, Back, Sternal Precaution Comments: Back precautions with LSO; Non-weight bearing RUE, Weight bearing as toelerated RLL with CAM Boot Required Braces or Orthoses: Spinal Brace Spinal Brace: Other (comment) (wear during ambulation and for comfort) Restrictions Weight Bearing Restrictions: Yes RUE Weight Bearing: Non weight bearing RLE Weight Bearing: Weight bearing as tolerated Other Position/Activity Restrictions: Back and sternal precautions    See Function Navigator for Current Functional Status.   Therapy/Group: Individual Therapy  David Hartman 09/24/2015, 2:57 PM

## 2015-09-24 NOTE — Progress Notes (Signed)
[facility] Individual Statement of Services  Patient Name:  David Hartman  Date:  09/24/2015  Welcome to the [facility].  Our goal is to provide you with an individualized program based on your diagnosis and situation, designed to meet your specific needs.  With this comprehensive rehabilitation program, you will be expected to participate in at least 3 hours of rehabilitation therapies Monday-Friday, with modified therapy programming on the weekends.  Your rehabilitation program will include the following services:  Physical Therapy (PT), Occupational Therapy (OT), 24 hour per day rehabilitation nursing, Neuropsychology, Case Management (Social Worker), Rehabilitation Medicine, Nutrition Services and Pharmacy Services  Weekly team conferences will be held on Wednesdays to discuss your progress.  Your Social Worker will talk with you frequently to get your input and to update you on team discussions.  Team conferences with you and your family in attendance may also be held.  Expected length of stay:  7 to 10 days  Overall anticipated outcome:  Modified Independent  Depending on your progress and recovery, your program may change. Your Social Worker will coordinate services and will keep you informed of any changes. Your Social Worker's name and contact numbers are listed  below.  The following services may also be recommended but are not provided by the [facility] will be made to provide these services after discharge if needed.  Arrangements include referral to agencies that provide these services.  Your insurance has been verified to be:  Workers' Market researcher primary doctor is:  Tawni Carnes, [place]  Pertinent information will be shared with your doctor and your insurance  company.  Social Worker:  Alfonse Alpers, LCSW  [phone] or [phone]  Information discussed with and copy given to patient by: Trey Sailors, 09/24/2015, 12:14 PM

## 2015-09-24 NOTE — Progress Notes (Addendum)
Occupational Therapy Session Note  Patient Details  Name: David Hartman MRN: [id number] Date of Birth: Jul 17, 1960  Today's Date: 09/24/2015 OT Individual Time: [id number] and [id number] OT Individual Time Calculation (min): 33 min and 68 minutes    Short Term Goals: Week 1:  OT Short Term Goal 1 (Week 1): Pt will complete LB dressing with Mod A and AE OT Short Term Goal 2 (Week 1): Pts caregiver will don back orthosis with supervision  OT Short Term Goal 3 (Week 1): Pt will complete toilet transfer with supervision  OT Short Term Goal 4 (Week 1): Pt will complete bathing with Min A and use of AE  Skilled Therapeutic Interventions/Progress Updates:   Pt participated in skilled OT tx focusing on mgt of CAM boot, adherence to precautions, activity tolerance and safety education. Pt self propelled to therapy apartment with supervision and extra time. Transfer from w/c<bed completed with supervision demonstrating good safety with adherence to sternal and back precautions and hemi walker. At EOB, pt donned/doffed CAM boot with instruction on doffing boot before going to bed and completing LB dressing prior to donning boot in the morning. Pt-therapist collaboration completed for routine modification planning for pt to complete shower at night with daughter present and dressing in the morning with daughter assistance for orthosis. Pt educated on setting up clothing items at night to have reachable access in morning with verbalized understanding. Donning socks and shoes completed at EOB via circle sitting technique with good success. At end of session, pt self propelled back to room and was left in recliner with all needs within reach and ice on R LE. No c/o pain during session.   2nd Session 1:1Tx (33 minutes) Pt participated in skilled OT session focusing on precaution education, activity tolerance, d/c planning, and AE training. Pt reported wanting to practice using a button hook due to having button up  shirts at home. Pt self propelled to outdoor setting on ground floor. Education provided for use of button hook with pt demonstrating use to don button up shirt. Due to high task demands, pt opted for using one handed hemi technique for buttoning. Pt-therapist collaboration completed for creating individualized AE recommendation sheet for daughter to purchase prior to discharge. Pt was also provided 2 handouts on sternal and back precautions due to pt reporting that precautions are difficult to remember. Memory strategies utilized for pt to recall all precautions with min cuing still required. At end of session pt self propelled back to room. Toilet transfer and tasks completed with close supervision. Pt was repositioned in recliner for comfort with all needs within reach at time of departure. No c/o pain.    Therapy Documentation Precautions:  Precautions Precautions: Fall, Back, Sternal Precaution Comments: Back precautions with LSO; Non-weight bearing RUE, Weight bearing as toelerated RLL with CAM Boot Required Braces or Orthoses: Spinal Brace Spinal Brace: Other (comment) (wear during ambulation and for comfort) Restrictions Weight Bearing Restrictions: Yes RUE Weight Bearing: Non weight bearing RLE Weight Bearing: Weight bearing as tolerated Other Position/Activity Restrictions: Back and sternal precautions  General:   Vital Signs: Therapy Vitals Temp: 99.1 F (37.3 C) Temp Source: Oral Pulse Rate: 98 Resp: 18 BP: 112/60 Patient Position (if appropriate): Sitting Oxygen Therapy SpO2: 94 % O2 Device: Not Delivered  ADL: ADL ADL Comments: Please see functional navigator for ADL status     See Function Navigator for Current Functional Status.   Therapy/Group: Individual Therapy  David Hartman A David Hartman 09/24/2015, 4:21 PM
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
## 2015-09-25 ENCOUNTER — Inpatient Hospital Stay (HOSPITAL_COMMUNITY): Payer: Worker's Compensation | Admitting: Occupational Therapy

## 2015-09-25 ENCOUNTER — Inpatient Hospital Stay (HOSPITAL_COMMUNITY): Payer: 59 | Admitting: Physical Therapy

## 2015-09-25 ENCOUNTER — Inpatient Hospital Stay (HOSPITAL_COMMUNITY): Payer: 59 | Admitting: Occupational Therapy

## 2015-09-25 MED ORDER — OXYCODONE HCL 5 MG PO TABS
15.0000 mg | ORAL_TABLET | Freq: Four times a day (QID) | ORAL | Status: DC | PRN
  Administered 2015-09-25 – 2015-09-26 (×3): 15 mg via ORAL
  Filled 2015-09-25 (×3): qty 3

## 2015-09-25 MED ORDER — TRAMADOL HCL 50 MG PO TABS
50.0000 mg | ORAL_TABLET | Freq: Three times a day (TID) | ORAL | Status: DC
  Administered 2015-09-25 – 2015-09-26 (×3): 50 mg via ORAL
  Filled 2015-09-25 (×4): qty 1

## 2015-09-25 NOTE — Progress Notes (Signed)
Occupational Therapy Session Note  Patient Details  Name: David Hartman MRN: [id number] Date of Birth: 1960-05-09  Today's Date: 09/25/2015 OT Individual Time: [id number] OT Individual Time Calculation (min): 58 min     Short Term Goals: Week 1:  OT Short Term Goal 1 (Week 1): Pt will complete LB dressing with Mod A and AE OT Short Term Goal 2 (Week 1): Pts caregiver will don back orthosis with supervision  OT Short Term Goal 3 (Week 1): Pt will complete toilet transfer with supervision  OT Short Term Goal 4 (Week 1): Pt will complete bathing with Min A and use of AE  Skilled Therapeutic Interventions/Progress Updates:    Upon entering the room, pt supine in bed sleeping but agreeable to OT intervention. Pt correctly verbalizes all of precautions with 100% accuracy. Pt performed supine >sit with supervision to EOB. OT assisting pt with donning back brace. Pt performs sit <>stand with supervision and ambulated with use of quad cane to obtain all needed items for bathing. Pt engaged in bathing and dressing while seated in wheelchair at sink. Pt performs UB self care with supervision from wheelchair. Steady assist for balance when standing for LB clothing management. Pt able to thread B pants with increased time this session. Pt seated in wheelchair with breakfast tray set up in front of him. Call bell and all needed items within reach upon exiting the room.    Therapy Documentation Precautions:  Precautions Precautions: Fall, Back, Sternal Precaution Comments: Back precautions with LSO; Non-weight bearing RUE, Weight bearing as toelerated RLL with CAM Boot Required Braces or Orthoses: Spinal Brace Spinal Brace: Other (comment) (wear during ambulation and for comfort) Restrictions Weight Bearing Restrictions: Yes RUE Weight Bearing: Non weight bearing RLE Weight Bearing: Weight bearing as tolerated Other Position/Activity Restrictions: Back and sternal precautions  Pain: Pain  Assessment Pain Assessment: 0-10 Pain Score: 4  Pain Type: Acute pain Pain Location: Back Pain Descriptors / Indicators: Aching;Discomfort Pain Intervention(s): Repositioned ADL: ADL ADL Comments: Please see functional navigator for ADL status Exercises:   Other Treatments:    See Function Navigator for Current Functional Status.   Therapy/Group: Individual Therapy  [name] 09/25/2015, 12:19 PM

## 2015-09-25 NOTE — Progress Notes (Addendum)
Occupational Therapy Session Note  Patient Details  Name: David Hartman MRN: [id number] Date of Birth: Dec 31, 1960  Today's Date: 09/25/2015 OT Individual Time: 1030-1057 OT Individual Time Calculation (min): 27 min   Short Term Goals: Week 1:  OT Short Term Goal 1 (Week 1): Pt will complete LB dressing with Mod A and AE OT Short Term Goal 2 (Week 1): Pts caregiver will don back orthosis with supervision  OT Short Term Goal 3 (Week 1): Pt will complete toilet transfer with supervision  OT Short Term Goal 4 (Week 1): Pt will complete bathing with Min A and use of AE  Skilled Therapeutic Interventions/Progress Updates:   Pt seen for skilled OT treatment with focus on dynamic standing balance, activity tolerance, and activity modifications. Pt completed stand-step turn transfers with close supervision and quad cane. Dynavision and table checkers used for dynamic standing activity. Pt able to stand for 2-3 minute bouts and maintain dynamic standing balance with min guard A when reaching outside base of support.  Pt returned to room at end of session and left with needs met.   Therapy Documentation Precautions:  Precautions Precautions: Fall, Back, Sternal Precaution Comments: Back precautions with LSO; Non-weight bearing RUE, Weight bearing as toelerated RLL with CAM Boot Required Braces or Orthoses: Spinal Brace Spinal Brace: Other (comment) (wear during ambulation and for comfort) Restrictions Weight Bearing Restrictions: Yes RUE Weight Bearing: Non weight bearing RLE Weight Bearing: Weight bearing as tolerated Other Position/Activity Restrictions: Back and sternal precautions  Pain: Pain Assessment Pain Assessment: 0-10 Pain Score: 4  Pain Type: Acute pain Pain Location: Back Pain Descriptors / Indicators: Aching;Discomfort Pain Intervention(s): Repositioned ADL: ADL ADL Comments: Please see functional navigator for ADL status Exercises:   Other Treatments:    See Function  Navigator for Current Functional Status.   Therapy/Group: Individual Therapy  Valma Cava 09/25/2015, 10:59 AM

## 2015-09-25 NOTE — Progress Notes (Signed)
Physical Therapy Session Note  Patient Details  Name: David Hartman MRN: [id number] Date of Birth: 1960-05-25  Today's Date: 09/25/2015 PT Individual Time: 0900-0959 PT Individual Time Calculation (min): 59 min    Short Term Goals: Week 1:  PT Short Term Goal 1 (Week 1): STG+=LTG  Skilled Therapeutic Interventions/Progress Updates:    Pt received resting in recliner, c/o 4/10 pain but reports being premedicated, agreeable to therapy session.  Session focus on gait training with LRAD, activity tolerance, dynamic standing and sitting balance, and stair negotiation.  Pt amb to therapy apartment with hemiwalker mod I.  Pt performing dynamic standing balance task making the bed in therapy apartment.  Pt able to successfully make the bed while maintaining back precautions and without use of hemiwalker for half of task.  PT instructed pt in tub transfer using tub transfer bench for dynamic sitting balance.  Gait training with quad cane x120'+200' with distant supervision.  Stair negotiation x12 steps with 1 hand rail and distant supervision.  Pt returned to room at end of session and positioned upright in recliner with call bell in reach and needs met.   Therapy Documentation Precautions:  Precautions Precautions: Fall, Back, Sternal Precaution Comments: Back precautions with LSO; Non-weight bearing RUE, Weight bearing as toelerated RLL with CAM Boot Required Braces or Orthoses: Spinal Brace Spinal Brace: Other (comment) (wear during ambulation and for comfort) Restrictions Weight Bearing Restrictions: Yes RUE Weight Bearing: Non weight bearing RLE Weight Bearing: Weight bearing as tolerated Other Position/Activity Restrictions: Back and sternal precautions   See Function Navigator for Current Functional Status.   Therapy/Group: Individual Therapy  Ether Goebel E Penven-Crew 09/25/2015, 10:02 AM

## 2015-09-25 NOTE — Progress Notes (Signed)
Physical Therapy Session Note  Patient Details  Name: David Hartman MRN: [id number] Date of Birth: 06-Feb-1960  Today's Date: 09/25/2015 PT Individual Time: 1545-1630 PT Individual Time Calculation (min): 45 min    Short Term Goals: Week 1:  PT Short Term Goal 1 (Week 1): STG+=LTG  Skilled Therapeutic Interventions/Progress Updates:    Pt received resting in recliner, no c/o pain and agreeable to therapy session.  Daughter, [name], present for therapy session.  Pt amb to therapy apartment with quad cane, mod I.  PT instructed pt in simulated walk in shower transfer for balance and d/c planning.  Pt performed 10 minutes on nustep at level 1 with BLEs and LUE for overall cardiopulmonary endurance.  Pt amb back to room at end of session and positioned in recliner with RLE elevated on w/c seat, ice applied to ankle.  Call bell in reach and needs met.  [name] signed off to assist pt to bathroom.    Therapy Documentation Precautions:  Precautions Precautions: Fall, Back, Sternal Precaution Comments: Back precautions with LSO; Non-weight bearing RUE, Weight bearing as toelerated RLL with CAM Boot Required Braces or Orthoses: Spinal Brace Spinal Brace: Other (comment) (wear during ambulation and for comfort) Restrictions Weight Bearing Restrictions: Yes RUE Weight Bearing: Non weight bearing RLE Weight Bearing: Weight bearing as tolerated Other Position/Activity Restrictions: Back and sternal precautions    See Function Navigator for Current Functional Status.   Therapy/Group: Individual Therapy  Earnest Conroy Penven-Crew 09/25/2015, 4:55 PM

## 2015-09-25 NOTE — Progress Notes (Signed)
Patient information reviewed and entered into eRehab system by Hermina Barnard, RN, CRRN, PPS Coordinator.  Information including medical coding and functional independence measure will be reviewed and updated through discharge.    
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
## 2015-09-26 ENCOUNTER — Inpatient Hospital Stay (HOSPITAL_COMMUNITY): Payer: Worker's Compensation | Admitting: Physical Therapy

## 2015-09-26 ENCOUNTER — Inpatient Hospital Stay (HOSPITAL_COMMUNITY): Payer: 59 | Admitting: Physical Therapy

## 2015-09-26 ENCOUNTER — Inpatient Hospital Stay (HOSPITAL_COMMUNITY): Payer: 59 | Admitting: Occupational Therapy

## 2015-09-26 DIAGNOSIS — G8918 Other acute postprocedural pain: Secondary | ICD-10-CM | POA: Insufficient documentation

## 2015-09-26 DIAGNOSIS — M109 Gout, unspecified: Secondary | ICD-10-CM | POA: Diagnosis present

## 2015-09-26 MED ORDER — OXYCODONE HCL 5 MG PO TABS
10.0000 mg | ORAL_TABLET | Freq: Four times a day (QID) | ORAL | Status: DC | PRN
  Administered 2015-09-26 – 2015-09-28 (×8): 10 mg via ORAL
  Filled 2015-09-26 (×8): qty 2

## 2015-09-26 MED ORDER — METHOCARBAMOL 500 MG PO TABS
1000.0000 mg | ORAL_TABLET | Freq: Four times a day (QID) | ORAL | Status: DC
  Administered 2015-09-26 – 2015-09-28 (×9): 1000 mg via ORAL
  Filled 2015-09-26 (×8): qty 2

## 2015-09-26 MED ORDER — GABAPENTIN 400 MG PO CAPS
400.0000 mg | ORAL_CAPSULE | Freq: Three times a day (TID) | ORAL | Status: DC
  Administered 2015-09-26 – 2015-09-28 (×6): 400 mg via ORAL
  Filled 2015-09-26 (×6): qty 1

## 2015-09-26 MED ORDER — CARBAMIDE PEROXIDE 6.5 % OT SOLN
5.0000 [drp] | Freq: Two times a day (BID) | OTIC | Status: DC
  Administered 2015-09-26 – 2015-09-28 (×4): 5 [drp] via OTIC
  Filled 2015-09-26: qty 15

## 2015-09-26 MED ORDER — TRAMADOL HCL 50 MG PO TABS
100.0000 mg | ORAL_TABLET | Freq: Three times a day (TID) | ORAL | Status: DC
  Administered 2015-09-26 – 2015-09-28 (×8): 100 mg via ORAL
  Filled 2015-09-26 (×8): qty 2

## 2015-09-26 NOTE — Progress Notes (Signed)
Physical Therapy Session Note  Patient Details  Name: David Hartman MRN: [id number] Date of Birth: [birth year]  Today's Date: 09/26/2015 PT Individual Time: 0931-1000 PT Individual Time Calculation (min): 29 min    Short Term Goals: Week 1:  PT Short Term Goal 1 (Week 1): STG+=LTG  Skilled Therapeutic Interventions/Progress Updates:    Pt received in w/c & agreeable to PT, noting 5/10 chest & back pain & RN aware. Session focused on gait training in controlled environment and through obstacle course requiring pt to weave between cones, step over poles, and negotiate uneven surface with [facility] & supervision assist. Pt completed car transfer from low sedan simulated height with supervision assist and maintaining sternal precautions when transferring sit>stand from low surface. Pt noted increased ease with task on this date. At end of session pt left sitting in w/c in room with all needs within reach.   Therapy Documentation Precautions:  Precautions Precautions: Fall, Back, Sternal Precaution Comments: Back precautions with LSO; Non-weight bearing RUE, Weight bearing as toelerated RLL with CAM Boot Required Braces or Orthoses: Spinal Brace Spinal Brace: Other (comment) (wear during ambulation and for comfort) Restrictions Weight Bearing Restrictions: Yes RUE Weight Bearing: Non weight bearing RLE Weight Bearing: Weight bearing as tolerated Other Position/Activity Restrictions: Back and sternal precautions   Pain: Pain Assessment Pain Assessment: No/denies pain Pain Score: 6  Pain Type: Acute pain Pain Location: Back Pain Descriptors / Indicators: Aching Patients Stated Pain Goal: 3 Pain Intervention(s): Medication (See eMAR) Multiple Pain Sites: No   See Function Navigator for Current Functional Status.   Therapy/Group: Individual Therapy  Waunita Schooner 09/26/2015, 10:16 AM

## 2015-09-26 NOTE — Progress Notes (Signed)
Physical Therapy Session Note  Patient Details  Name: David Hartman MRN: [id number] Date of Birth: [birth year]  Today's Date: 09/26/2015 PT Individual Time: 0802-0900 PT Individual Time Calculation (min): 58 min    Short Term Goals: Week 1:  PT Short Term Goal 1 (Week 1): STG+=LTG  Skilled Therapeutic Interventions/Progress Updates:     Patient received supine in bed and reports pain as listed below.   Patient performed supine>sit with supervision A from PT with heavy use of bed features and min cues for UE and LE management.   Gait training in controlled environment for 277f with QC and close supervision A from PT. Min cues provided by PT for AD management.  PT also instructed gait on carpeted surface for simulated home environment for 1260fwith QC and close supervision A from PT. Patient able to navigate around obstacles with only occassional cues for proper step through gait pattern.   PT instructed patient in [place] strengthening and balance exercises. PT provided min A with all standing therex to improve safety as well as min cues for proper ROM and decreased compenstion from trunk.  Throughout treatment, patient performed stand pivot transfers with QC and supervision A with min cues for AD management. Patient performed stand pivot x 1 without AD and supervision A from PT with min cues for sound UE placement for improved safety.   Patient left sitting in WC at end of treatment with call bell in reach and all needs met.      Therapy Documentation Precautions:  Precautions Precautions: Fall, Back, Sternal Precaution Comments: Back precautions with LSO; Non-weight bearing RUE, Weight bearing as toelerated RLL with CAM Boot Required Braces or Orthoses: Spinal Brace Spinal Brace: Other (comment) (wear during ambulation and for comfort) Restrictions Weight Bearing Restrictions: Yes RUE Weight Bearing: Non weight bearing RLE Weight Bearing: Weight bearing as  tolerated Other Position/Activity Restrictions: Back and sternal precautions     Pain: Pain Assessment Pain Assessment: No/denies pain Pain Score: 6  Pain Type: Acute pain Pain Location: Back Pain Descriptors / Indicators: Aching Patients Stated Pain Goal: 3 Pain Intervention(s): Medication (See eMAR) Multiple Pain Sites: No  See Function Navigator for Current Functional Status.   Therapy/Group: Individual Therapy  AuLorie Phenix/06/2015, 9:03 AM
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
## 2015-09-26 NOTE — Progress Notes (Signed)
Physical Therapy Session Note  Patient Details  Name: David Hartman MRN: [id number] Date of Birth: 09-Jul-1960  Today's Date: 09/26/2015 PT Individual Time: 1400-1430 PT Individual Time Calculation (min): 30 min    Short Term Goals: Week 1:  PT Short Term Goal 1 (Week 1): STG+=LTG  Skilled Therapeutic Interventions/Progress Updates:   Pt received seated in recliner; c/o pain as below and agreeable to treatment. Recliner>bed transfer ambulatory with quad cane and S; transferred onto 26" height bed without rails to simulate home environment with modI. No cues or assist needed to transfer in/out of high bed. Seated LE strengthening exercises 1x12-15 reps per exercise with pt given handout for performance outside of regular therapy sessions. Pt educated on progression with reps/sets/resistance and strength improves; pt verbalized understanding. Remained seated in recliner at end of session, all needs in reach.   Therapy Documentation Precautions:  Precautions Precautions: Fall, Back, Sternal Precaution Comments: Back precautions with LSO; Non-weight bearing RUE, Weight bearing as toelerated RLL with CAM Boot Required Braces or Orthoses: Spinal Brace Spinal Brace: Other (comment) (wear during ambulation and for comfort) Restrictions Weight Bearing Restrictions: Yes RUE Weight Bearing: Non weight bearing RLE Weight Bearing: Weight bearing as tolerated Other Position/Activity Restrictions: Back and sternal precautions  Pain: Pain Assessment Pain Score: 6  Pain Type: Acute pain Pain Location: Back Pain Descriptors / Indicators: Aching Patients Stated Pain Goal: 3 Pain Intervention(s): Medication (See eMAR)   See Function Navigator for Current Functional Status.   Therapy/Group: Individual Therapy  Luberta Mutter 09/26/2015, 2:25 PM

## 2015-09-26 NOTE — Progress Notes (Addendum)
Physical Therapy Session Note  Patient Details  Name: David Hartman MRN: [id number] Date of Birth: [birth year]  Today's Date: 09/26/2015 PT Individual Time: 1572-6203 PT Individual Time Calculation (min): 57 min    Short Term Goals: Week 1:  PT Short Term Goal 1 (Week 1): STG+=LTG  Skilled Therapeutic Interventions/Progress Updates:  Pt received resting in recliner, no c/o pain and agreeable to therapy session. Session focus on dynamic sitting/standing balance, transfers, and household ambulation for bathing and dressing at shower level.  Pt amb to shower with quad cane mod I.  Able to doff clothing using lateral leans and sit<>squat, doffed cam boot and socks/shoes while seated on tub bench.  Bathing at shower level with lateral leans to wash buttocks and min verbal cues to use long handled sponge to reach lower legs and back.  Dressing from tub bench with more than a reasonable amount of time.  PT donned cam boot due to small space in shower, pt reports he was able to don boot yesterday with more space.  PT assisted pt with donning TLSO.  Pt amb to therapy gym to retrieve elastic shoe laces and amb back to room mod I.  Pt left sitting upright in recliner with call bell in reach and needs met.      Therapy Documentation Precautions: Precautions Precautions: Fall, Back, Sternal Precaution Comments: Back precautions with LSO; Non-weight bearing RUE, Weight bearing as toelerated RLL with CAM Boot Required Braces or Orthoses: Spinal Brace Spinal Brace: Other (comment) (wear during ambulation and for comfort) Restrictions Weight Bearing Restrictions: Yes RUE Weight Bearing: Non weight bearing RLE Weight Bearing: Weight bearing as tolerated Other Position/Activity Restrictions: Back and sternal precautions    See Function Navigator for Current Functional Status.   Therapy/Group: Individual Therapy  Akyla Vavrek E Penven-Crew 09/26/2015, 2:04 PM
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
## 2015-09-27 ENCOUNTER — Inpatient Hospital Stay (HOSPITAL_COMMUNITY): Payer: Worker's Compensation | Admitting: Occupational Therapy

## 2015-09-27 ENCOUNTER — Encounter (HOSPITAL_COMMUNITY): Payer: 59

## 2015-09-27 ENCOUNTER — Inpatient Hospital Stay (HOSPITAL_COMMUNITY): Payer: Worker's Compensation | Admitting: Physical Therapy

## 2015-09-27 ENCOUNTER — Inpatient Hospital Stay (HOSPITAL_COMMUNITY): Payer: 59 | Admitting: *Deleted

## 2015-09-27 ENCOUNTER — Inpatient Hospital Stay (HOSPITAL_COMMUNITY): Payer: 59 | Admitting: Occupational Therapy

## 2015-09-27 DIAGNOSIS — E871 Hypo-osmolality and hyponatremia: Secondary | ICD-10-CM

## 2015-09-27 LAB — CBC WITH DIFFERENTIAL/PLATELET
BASOS PCT: 0 %
Basophils Absolute: 0 10*3/uL (ref 0.0–0.1)
EOS ABS: 0.5 10*3/uL (ref 0.0–0.7)
Eosinophils Relative: 5 %
HCT: 33.4 % — ABNORMAL LOW (ref 39.0–52.0)
Hemoglobin: 10.7 g/dL — ABNORMAL LOW (ref 13.0–17.0)
Lymphocytes Relative: 12 %
Lymphs Abs: 1.3 10*3/uL (ref 0.7–4.0)
MCH: 29.6 pg (ref 26.0–34.0)
MCHC: 32 g/dL (ref 30.0–36.0)
MCV: 92.5 fL (ref 78.0–100.0)
MONO ABS: 1 10*3/uL (ref 0.1–1.0)
MONOS PCT: 9 %
Neutro Abs: 7.8 10*3/uL — ABNORMAL HIGH (ref 1.7–7.7)
Neutrophils Relative %: 74 %
Platelets: 357 10*3/uL (ref 150–400)
RBC: 3.61 MIL/uL — ABNORMAL LOW (ref 4.22–5.81)
RDW: 13.4 % (ref 11.5–15.5)
WBC: 10.6 10*3/uL — ABNORMAL HIGH (ref 4.0–10.5)

## 2015-09-27 LAB — BASIC METABOLIC PANEL
Anion gap: 8 (ref 5–15)
BUN: 18 mg/dL (ref 6–20)
CALCIUM: 8.8 mg/dL — AB (ref 8.9–10.3)
CO2: 25 mmol/L (ref 22–32)
CREATININE: 0.93 mg/dL (ref 0.61–1.24)
Chloride: 98 mmol/L — ABNORMAL LOW (ref 101–111)
GFR calc Af Amer: >60 mL/min (ref >60)
GFR calc non Af Amer: >60 mL/min (ref >60)
GLUCOSE: 104 mg/dL — AB (ref 65–99)
Potassium: 4.7 mmol/L (ref 3.5–5.1)
Sodium: 131 mmol/L — ABNORMAL LOW (ref 135–145)

## 2015-09-27 NOTE — Progress Notes (Signed)
Physical Therapy Discharge Summary  Patient Details  Name: David Hartman MRN: [id number] Date of Birth: [birth year]  Today's Date: 09/27/2015 PT Individual Time: 1300-1410 PT Individual Time Calculation (min): 70 min     Patient has met 5 of 5 long term goals due to improved activity tolerance, improved balance, improved postural control, increased strength and decreased pain.  Patient to discharge at an ambulatory level Modified Independent.     Recommendation:  Patient will benefit from ongoing skilled PT services in home health setting to continue to advance safe functional mobility, address ongoing impairments in balance and activity tolerance, and minimize fall risk.  Equipment: recommending quad cane for community mobility  Reasons for discharge: treatment goals met  Patient/family agrees with progress made and goals achieved: Yes   Skilled PT Intervention: Pt received resting in recliner c/o 5/10 pain but agreeable to therapy session.  Pt performing transfers and ambulation without AD today, mod I.  Community level ambulation outdoors with up/down steps and uneven surfaces mod I.  PT administered BERG balance scale and patient demonstrates moderate fall risk as noted by score of 49/56 on Berg Balance Scale.  Discussed results with pt and made recommendations for quad cane in community.  Wii bowling for standing tolerance and standing balance.  Pt returned to room at end of session and made mod I in room.    PT Discharge  Cognition Overall Cognitive Status: Within Functional Limits for tasks assessed Arousal/Alertness: Awake/alert Orientation Level: Oriented X4 Focused Attention: Appears intact Sustained Attention: Appears intact Memory: Appears intact Awareness: Appears intact Problem Solving: Appears intact Safety/Judgment: Appears intact Sensation Sensation Light Touch: Appears Intact Coordination Gross Motor Movements are Fluid and Coordinated: Yes Fine Motor  Movements are Fluid and Coordinated: Yes Motor  Motor Motor - Discharge Observations: improving strength and activity tolerance  Mobility Transfers Transfers: Yes Sit to Stand: 6: Modified independent (Device/Increase time) Locomotion  Ambulation Ambulation: Yes Ambulation/Gait Assistance: 6: Modified independent (Device/Increase time) Ambulation Distance (Feet): 1000 Feet Assistive device: None Gait Gait: Yes Gait velocity: decreased Stairs / Additional Locomotion Stairs: Yes Stairs Assistance: 6: Modified independent (Device/Increase time) Stair Management Technique: One rail Right;One rail Left;Step to pattern Number of Stairs: 12 Wheelchair Mobility Wheelchair Mobility: No   Balance Standardized Balance Assessment 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 Berg Balance Test Sit to Stand: Able to stand without using hands and stabilize independently Standing Unsupported: Able to stand safely 2 minutes Sitting with Back Unsupported but Feet Supported on Floor or Stool: Able to sit safely and securely 2 minutes Stand to Sit: Sits safely with minimal use of hands Transfers: Able to transfer safely, minor use of hands Standing Unsupported with Eyes Closed: Able to stand 10 seconds safely Standing Ubsupported with Feet Together: Able to place feet together independently and stand 1 minute safely From Standing, Reach Forward with Outstretched Arm: Can reach confidently >25 cm (10") From Standing Position, Pick up Object from Floor: Able to pick up shoe safely and easily From Standing Position, Turn to Look Behind Over each Shoulder: Needs assist to keep from losing balance and falling (back precautions) Turn 360 Degrees: Able to turn 360 degrees safely one side only in 4 seconds or less Standing Unsupported, Alternately Place Feet on Step/Stool: Able to complete 4 steps without aid or supervision Standing Unsupported, One Foot in Front: Able to place foot tandem  independently and hold 30 seconds Standing on One Leg: Able to lift leg independently and hold > 10 seconds Total Score: 49  Extremity Assessment      RLE Assessment RLE Assessment: Exceptions to [facility] RLE AROM (degrees) RLE Overall AROM Comments: WFL assessed in sitting RLE Strength Right Hip Flexion: 4+/5 (slightly limited due to pain) Right Knee Flexion: 4+/5 (slightly limited due to pain) Right Knee Extension: 4+/5 (slightly limited due to pain) LLE Assessment LLE Assessment: Exceptions to WFL LLE AROM (degrees) LLE Overall AROM Comments: WFL assessed in sitting LLE Strength Left Hip Flexion: 4+/5 Left Knee Flexion: 4+/5 Left Knee Extension: 4+/5 Left Ankle Dorsiflexion: 4+/5 Left Ankle Plantar Flexion: 4+/5   See Function Navigator for Current Functional Status.  Kennia Vanvorst E Penven-Crew 09/27/2015, 2:49 PM

## 2015-09-27 NOTE — Progress Notes (Signed)
Occupational Therapy Discharge Summary  Patient Details  Name: Jayvon Mounger MRN: [id number] Date of Birth: [birth year]  Today's Date: 09/27/2015 OT Individual Time: 6578-4696 OT Individual Time Calculation (min): 88 min     Patient has met 8 of 8 long term goals due to improved activity tolerance, improved balance and ability to compensate for deficits.  Patient to discharge at overall Mod I - supervision level.  Patient's care partner is independent to provide the necessary physical assistance at discharge.    Reasons goals not met: all goals met  Recommendation:  Patient will benefit from ongoing skilled OT services in home health setting to continue to advance functional skills in the area of BADL and iADL.  Equipment: Tub transfer bench  Reasons for discharge: treatment goals met  Patient/family agrees with progress made and goals achieved: Yes   OT Intervention: Upon entering the room, pt seated in wheelchair with no c/o pain this session. Pt adheres to all precautions during this session. Pt ambulated without use of AD to obtain clothing items needed for self care. Pt ambulated to tub room in order to shower in tub/shower combination with use of tub transfer bench similar to home environment. Pt performed shower transfer with supervision onto TTB. Pt engaged in lateral lean and use of long handled sponge in order to increase I with self care. Pt transferred from TTB to chair in order to dress self. Pt able to don cam boot with increased time and crossing ankle over opposite knee. Pt utilizing shoe button in order to don L shoe. Pt needing set up A to don spinal brace. Pt ambulating back to room in same manner without AD and mod I. Pt seated in recliner chair with call bell and all needed items within reach.   OT Discharge Precautions/Restrictions Precautions Precautions: Fall;Back;Sternal Precaution Comments: Back precautions with LSO; Non-weight bearing RUE, Weight bearing as  toelerated RLL with CAM Boot Required Braces or Orthoses: Spinal Brace Restrictions Weight Bearing Restrictions: Yes RUE Weight Bearing: Non weight bearing RLE Weight Bearing: Weight bearing as tolerated Other Position/Activity Restrictions: Back and sternal precautions  Vital Signs Therapy Vitals BP: 117/62 Pain Pain Assessment Pain Assessment: No/denies pain Pain Score: 3  Pain Location: Back Pain Intervention(s): Medication (See eMAR) ADL ADL ADL Comments: Please see functional navigator for ADL status Vision/Perception  Vision- History Baseline Vision/History: Wears glasses Wears Glasses: At all times Patient Visual Report: No change from baseline Vision- Assessment Vision Assessment?: No apparent visual deficits  Cognition Overall Cognitive Status: Within Functional Limits for tasks assessed Arousal/Alertness: Awake/alert Orientation Level: Oriented X4 Focused Attention: Appears intact Sustained Attention: Appears intact Memory: Appears intact Awareness: Appears intact Problem Solving: Appears intact Sensation Sensation Light Touch: Appears Intact Stereognosis: Appears Intact Hot/Cold: Appears Intact Proprioception: Appears Intact Coordination Gross Motor Movements are Fluid and Coordinated: Yes Fine Motor Movements are Fluid and Coordinated: Yes Motor  Motor Motor - Discharge Observations: improving strength and activity tolerance Mobility  Bed Mobility Bed Mobility: Sit to Supine Sit to Supine: 6: Modified independent (Device/Increase time) Transfers Sit to Stand: 6: Modified independent (Device/Increase time)  Trunk/Postural Assessment  Cervical Assessment Cervical Assessment: Within Functional Limits Thoracic Assessment Thoracic Assessment: Within Functional Limits Lumbar Assessment Lumbar Assessment: Exceptions to [facility] Postural Control Postural Control: Within Functional Limits  Balance Balance Balance Assessed: Yes Extremity/Trunk  Assessment RUE Assessment RUE Assessment: Exceptions to WFL (NWB) LUE Assessment LUE Assessment: Within Functional Limits   See Function Navigator for Current Functional Status.  Abner Greenspan, [place] Joswick L  09/27/2015, 8:49 PM

## 2015-09-27 NOTE — Progress Notes (Addendum)
Social Work Discharge Note  The overall goal for the admission was met for:   Discharge location: Yes - to dtr's home   Length of Stay: Yes - 7 days   Discharge activity level: Yes - Modified Independent with some supervision tasks  Home/community participation: Yes  Services provided included: MD, RD, PT, OT, RN, Pharmacy, Neuropsych and SW  Financial Services: Worker's Comp  Follow-up services arranged: Home Health: PT/OT from [facility], DME: large base quad cane; bedside commode; tub transfer bench from [facility] and Patient/Family has no preference for HH/DME agencies  Workers' Comp/[employer] arranged these services.  Comments (or additional information): Pt to go to his dtr's home for some recovery time before trying to return to his home.  Workers' Comp to remain involved.  Pt continues to have financial questions for them and asked CM to call pt directly today.  Also gave pt her number.    Patient/Family verbalized understanding of follow-up arrangements: Yes  Individual responsible for coordination of the follow-up plan:  Pt and assistance from his family as needed.  Confirmed correct DME delivered: Prevatt, Jennifer Capps 09/27/2015    Prevatt, Jennifer Capps 

## 2015-09-27 NOTE — Progress Notes (Signed)
Occupational Therapy Session Note  Patient Details  Name: David Hartman MRN: [id number] Date of Birth: Jan 04, 1961  Today's Date: 09/27/2015 OT Individual Time: 7955-8316 OT Individual Time Calculation (min): 30 min    Short Term Goals: Week 1:  OT Short Term Goal 1 (Week 1): Pt will complete LB dressing with Mod A and AE OT Short Term Goal 2 (Week 1): Pts caregiver will don back orthosis with supervision  OT Short Term Goal 3 (Week 1): Pt will complete toilet transfer with supervision  OT Short Term Goal 4 (Week 1): Pt will complete bathing with Min A and use of AE  Skilled Therapeutic Interventions/Progress Updates:   Pt seen for skilled OT session with focus on small meal prep, and IADL modifications. OT discussed techniques and modifications for safe meal prep in kitchen while maintaining precautions. Pt able to remove pitcher from fridge, pour glass, and carry glass to table without use of cane w/ supervision and no LOB. OT provided pt with dysom and plate shield to increase pt's independence with one-handed activities. Pt left seated in recliner at end of session with needs met.   Therapy Documentation Precautions:  Precautions Precautions: Fall, Back, Sternal Precaution Comments: Back precautions with LSO; Non-weight bearing RUE, Weight bearing as toelerated RLL with CAM Boot Required Braces or Orthoses: Spinal Brace Spinal Brace: Other (comment) (wear during ambulation and for comfort) Restrictions Weight Bearing Restrictions: Yes RUE Weight Bearing: Non weight bearing RLE Weight Bearing: Weight bearing as tolerated Other Position/Activity Restrictions: Back and sternal precautions  Pain: Pain Assessment Pain Score: denies pain ADL: ADL ADL Comments: Please see functional navigator for ADL status Exercises:   Other Treatments:    See Function Navigator for Current Functional Status.   Therapy/Group: Individual Therapy  Valma Cava 09/27/2015, 6:26 PM
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
## 2015-09-27 NOTE — Progress Notes (Signed)
Social Work Patient ID: David Hartman, male   DOB: Feb 13, 1960, 55 y.o.   MRN: [id number]   CSW met with pt 09-26-15 and called pt's dtr to update them on team conference discussion and target d/c date of 09-28-15.  Pt and dtr are pleased with pt's progress and will prepare for d/c 09-28-15.  CSW called and faxed workers' comp Tourist information centre manager several times and heard back from her today and begin making d/c arrangements with her immediately.  [employer] became involved, as well, to set up home health and to order DME.  DME to be delivered to pt's room on 09-28-15 from [facility].  CSW awaits response on which [facility] agency will provide f/u therapies.  CSW provided necessary documentation to assist this process.  Pt is very appreciative of care he received on rehab.  CSW's co-workers are available to assist if any needs arise as this CSW will be out of the hospital.
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
## 2015-09-28 ENCOUNTER — Encounter (HOSPITAL_COMMUNITY): Payer: Self-pay | Admitting: Physical Medicine and Rehabilitation

## 2015-09-28 DIAGNOSIS — E871 Hypo-osmolality and hyponatremia: Secondary | ICD-10-CM

## 2015-09-28 DIAGNOSIS — N179 Acute kidney failure, unspecified: Secondary | ICD-10-CM

## 2015-09-28 DIAGNOSIS — K5901 Slow transit constipation: Secondary | ICD-10-CM

## 2015-09-28 DIAGNOSIS — G8918 Other acute postprocedural pain: Secondary | ICD-10-CM

## 2015-09-28 MED ORDER — GABAPENTIN 400 MG PO CAPS: 400.0000 mg | ORAL_CAPSULE | Freq: Three times a day (TID) | ORAL | 0 refills | Status: DC

## 2015-09-28 MED ORDER — OXYCODONE HCL 10 MG PO TABS: 5.0000 mg | ORAL_TABLET | Freq: Four times a day (QID) | ORAL | 0 refills | Status: DC | PRN

## 2015-09-28 MED ORDER — POLYETHYLENE GLYCOL 3350 17 G PO PACK: 17.0000 g | PACK | Freq: Every day | ORAL | 0 refills | Status: DC

## 2015-09-28 MED ORDER — TRAMADOL HCL 50 MG PO TABS
100.0000 mg | ORAL_TABLET | Freq: Four times a day (QID) | ORAL | Status: DC
Start: 2015-09-28 — End: 2015-09-28
  Administered 2015-09-28: 100 mg via ORAL
  Filled 2015-09-28: qty 2

## 2015-09-28 MED ORDER — TRAMADOL HCL 50 MG PO TABS: 100.0000 mg | ORAL_TABLET | Freq: Four times a day (QID) | ORAL | 0 refills | Status: DC

## 2015-09-28 MED ORDER — OXYCODONE HCL 5 MG PO TABS: 10.0000 mg | ORAL_TABLET | Freq: Four times a day (QID) | ORAL | Status: DC | PRN
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
## 2015-09-28 NOTE — Discharge Instructions (Addendum)
Inpatient Rehab Discharge Instructions  [name] Discharge date and time:  09/28/15   Activities/Precautions/ Functional Status: Activity: no lifting, driving, or strenuous exercise till cleared by MD Diet: regular diet Wound Care: keep wound clean and dry   Functional status:  ___ No restrictions     ___ Walk up steps independently ___ 24/7 supervision/assistance   ___ Walk up steps with assistance _X__ Intermittent supervision/assistance  ___ Bathe/dress independently _X__ Walk with cane     ___ Bathe/dress with assistance ___ Walk Independently    ___ Shower independently ___ Walk with assistance    ___ Shower with assistance _X__ No alcohol     ___ Return to work/school ________   COMMUNITY REFERRALS UPON DISCHARGE:   Home Health:   PT     OT     Agency: [facility]  Phone:  [phone] Medical Equipment/Items Ordered:  Bedside commode; large base quad cane; tub transfer bench  Agency/Supplier:  [facility]         Phone:  [phone] Other:  To follow up with doctors at [facility], call their new patient line  [phone] [employer] [facility] [employer]:  This is the agency setting up your home health and equipment  [place]:  Your workers' compensation case manager  [phone]   Special Instructions: 1. No weight on right arm. 2. Increase Miralax to twice a day if you do not have a BM in 24 hours.  3. Try using  1/2 oxycodone and decrease oxycodone to every 8 hours as needed --continue to wean to prevent tolerance/addiction.    My questions have been answered and I understand these instructions. I will adhere to these goals and the provided educational materials after my discharge from the hospital.  Patient/Caregiver Signature _______________________________ Date __________  Clinician Signature _______________________________________ Date __________  Please bring this form and your medication list with you to all your follow-up  doctor's appointments.
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
## 2015-09-28 NOTE — Progress Notes (Signed)
Pt. Got. D/c instructions and prescriptions,pt. Ready to go home with his family.

## 2015-10-05 ENCOUNTER — Telehealth: Payer: Self-pay | Admitting: Physical Medicine & Rehabilitation

## 2015-10-05 NOTE — Telephone Encounter (Signed)
Patient is needing a refill on Oxycodone and Toradol.  They were only given a weeks worth of medicine when they left hospital.  Please call [name] at [phone] because patient will be out of medication.

## 2015-10-05 NOTE — Telephone Encounter (Signed)
Spoke with [name] and explained to her that Dr. Posey Pronto wasn't going to write for anymore narcotics, these were used sparingly while in hospital and he would discuss anything further with them at the follow up appointment on 9/20 with him.

## 2015-10-09 ENCOUNTER — Other Ambulatory Visit: Payer: Self-pay

## 2015-10-09 MED ORDER — POLYETHYLENE GLYCOL 3350 17 G PO PACK: 17.0000 g | PACK | Freq: Every day | ORAL | 0 refills | Status: DC

## 2015-10-10 ENCOUNTER — Encounter: Payer: 59 | Admitting: Physical Medicine & Rehabilitation

## 2015-10-10 ENCOUNTER — Encounter: Payer: Self-pay | Admitting: Physical Medicine & Rehabilitation

## 2015-10-10 ENCOUNTER — Encounter
Payer: Worker's Compensation | Attending: Physical Medicine & Rehabilitation | Admitting: Physical Medicine & Rehabilitation

## 2015-10-10 VITALS — BP 112/78 | HR 71 | Resp 14

## 2015-10-10 DIAGNOSIS — I10 Essential (primary) hypertension: Secondary | ICD-10-CM | POA: Diagnosis not present

## 2015-10-10 DIAGNOSIS — T148 Other injury of unspecified body region: Secondary | ICD-10-CM | POA: Diagnosis not present

## 2015-10-10 DIAGNOSIS — G8918 Other acute postprocedural pain: Secondary | ICD-10-CM

## 2015-10-10 DIAGNOSIS — K219 Gastro-esophageal reflux disease without esophagitis: Secondary | ICD-10-CM | POA: Insufficient documentation

## 2015-10-10 DIAGNOSIS — E871 Hypo-osmolality and hyponatremia: Secondary | ICD-10-CM | POA: Diagnosis not present

## 2015-10-10 DIAGNOSIS — D72829 Elevated white blood cell count, unspecified: Secondary | ICD-10-CM | POA: Diagnosis not present

## 2015-10-10 DIAGNOSIS — Z5181 Encounter for therapeutic drug level monitoring: Secondary | ICD-10-CM

## 2015-10-10 DIAGNOSIS — R269 Unspecified abnormalities of gait and mobility: Secondary | ICD-10-CM

## 2015-10-10 DIAGNOSIS — T1490 Injury, unspecified: Secondary | ICD-10-CM | POA: Diagnosis not present

## 2015-10-10 DIAGNOSIS — Z79899 Other long term (current) drug therapy: Secondary | ICD-10-CM

## 2015-10-10 DIAGNOSIS — Z5189 Encounter for other specified aftercare: Secondary | ICD-10-CM | POA: Diagnosis not present

## 2015-10-10 DIAGNOSIS — T1490XA Injury, unspecified, initial encounter: Secondary | ICD-10-CM

## 2015-10-10 DIAGNOSIS — Z8546 Personal history of malignant neoplasm of prostate: Secondary | ICD-10-CM | POA: Diagnosis not present

## 2015-10-10 DIAGNOSIS — S01311S Laceration without foreign body of right ear, sequela: Secondary | ICD-10-CM

## 2015-10-10 MED ORDER — METHOCARBAMOL 500 MG PO TABS: 1000.0000 mg | ORAL_TABLET | Freq: Four times a day (QID) | ORAL | 0 refills | Status: DC

## 2015-10-10 MED ORDER — TRAMADOL HCL 50 MG PO TABS: 100.0000 mg | ORAL_TABLET | Freq: Four times a day (QID) | ORAL | 0 refills | Status: DC

## 2015-10-10 MED ORDER — OXYCODONE HCL 5 MG PO TABS: 5.0000 mg | ORAL_TABLET | Freq: Every day | ORAL | 0 refills | Status: AC | PRN
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
## 2015-10-10 NOTE — Addendum Note (Signed)
Addended by: Geryl Rankins D on: 10/10/2015 03:38 PM   Modules accepted: Orders

## 2015-10-18 ENCOUNTER — Other Ambulatory Visit: Payer: Self-pay | Admitting: *Deleted

## 2015-10-18 LAB — TOXASSURE SELECT,+ANTIDEPR,UR

## 2015-10-18 MED ORDER — GABAPENTIN 600 MG PO TABS: 600.0000 mg | ORAL_TABLET | Freq: Three times a day (TID) | ORAL | 0 refills | Status: DC

## 2015-10-22 NOTE — Progress Notes (Signed)
Urine drug screen for this encounter is consistent for prescribed medication 

## 2015-11-08 ENCOUNTER — Ambulatory Visit: Payer: Self-pay | Admitting: Physical Medicine & Rehabilitation

## 2015-11-09 ENCOUNTER — Other Ambulatory Visit: Payer: Self-pay | Admitting: *Deleted

## 2015-11-09 MED ORDER — METHOCARBAMOL 500 MG PO TABS: 1000.0000 mg | ORAL_TABLET | Freq: Four times a day (QID) | ORAL | 0 refills | Status: DC

## 2015-11-12 ENCOUNTER — Encounter: Payer: Self-pay | Admitting: Registered Nurse

## 2015-11-12 ENCOUNTER — Encounter: Payer: Worker's Compensation | Attending: Physical Medicine & Rehabilitation | Admitting: Registered Nurse

## 2015-11-12 VITALS — BP 120/80 | HR 75 | Resp 14

## 2015-11-12 DIAGNOSIS — E871 Hypo-osmolality and hyponatremia: Secondary | ICD-10-CM | POA: Diagnosis present

## 2015-11-12 DIAGNOSIS — D72829 Elevated white blood cell count, unspecified: Secondary | ICD-10-CM | POA: Diagnosis not present

## 2015-11-12 DIAGNOSIS — T1490XA Injury, unspecified, initial encounter: Secondary | ICD-10-CM

## 2015-11-12 DIAGNOSIS — M7061 Trochanteric bursitis, right hip: Secondary | ICD-10-CM

## 2015-11-12 DIAGNOSIS — Z5189 Encounter for other specified aftercare: Secondary | ICD-10-CM | POA: Diagnosis present

## 2015-11-12 DIAGNOSIS — M5416 Radiculopathy, lumbar region: Secondary | ICD-10-CM

## 2015-11-12 DIAGNOSIS — T148XXA Other injury of unspecified body region, initial encounter: Secondary | ICD-10-CM | POA: Diagnosis not present

## 2015-11-12 DIAGNOSIS — M546 Pain in thoracic spine: Secondary | ICD-10-CM | POA: Diagnosis not present

## 2015-11-12 DIAGNOSIS — Z5181 Encounter for therapeutic drug level monitoring: Secondary | ICD-10-CM | POA: Diagnosis not present

## 2015-11-12 DIAGNOSIS — I10 Essential (primary) hypertension: Secondary | ICD-10-CM | POA: Diagnosis not present

## 2015-11-12 DIAGNOSIS — K219 Gastro-esophageal reflux disease without esophagitis: Secondary | ICD-10-CM | POA: Diagnosis not present

## 2015-11-12 DIAGNOSIS — M7062 Trochanteric bursitis, left hip: Secondary | ICD-10-CM | POA: Diagnosis not present

## 2015-11-12 DIAGNOSIS — Z8546 Personal history of malignant neoplasm of prostate: Secondary | ICD-10-CM | POA: Insufficient documentation

## 2015-11-12 MED ORDER — LIDOCAINE 5 % EX PTCH: 2.0000 | MEDICATED_PATCH | CUTANEOUS | 1 refills | Status: DC

## 2015-11-12 MED ORDER — TRAMADOL HCL 50 MG PO TABS: 100.0000 mg | ORAL_TABLET | Freq: Four times a day (QID) | ORAL | 0 refills | Status: DC

## 2015-11-12 MED ORDER — GABAPENTIN 600 MG PO TABS: 600.0000 mg | ORAL_TABLET | Freq: Three times a day (TID) | ORAL | 0 refills | Status: DC
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
## 2015-11-16 ENCOUNTER — Observation Stay (HOSPITAL_COMMUNITY)
Admission: EM | Admit: 2015-11-16 | Discharge: 2015-11-17 | Disposition: A | Discharge status: Home / Self Care | Payer: 59 | Attending: Internal Medicine | Admitting: Internal Medicine

## 2015-11-16 ENCOUNTER — Observation Stay (HOSPITAL_BASED_OUTPATIENT_CLINIC_OR_DEPARTMENT_OTHER): Payer: 59

## 2015-11-16 ENCOUNTER — Encounter (HOSPITAL_COMMUNITY): Payer: Self-pay | Admitting: Emergency Medicine

## 2015-11-16 ENCOUNTER — Observation Stay (HOSPITAL_COMMUNITY): Payer: 59

## 2015-11-16 ENCOUNTER — Emergency Department (HOSPITAL_COMMUNITY): Payer: 59

## 2015-11-16 DIAGNOSIS — F419 Anxiety disorder, unspecified: Secondary | ICD-10-CM | POA: Insufficient documentation

## 2015-11-16 DIAGNOSIS — E7800 Pure hypercholesterolemia, unspecified: Secondary | ICD-10-CM | POA: Insufficient documentation

## 2015-11-16 DIAGNOSIS — E785 Hyperlipidemia, unspecified: Secondary | ICD-10-CM | POA: Diagnosis present

## 2015-11-16 DIAGNOSIS — F32A Depression, unspecified: Secondary | ICD-10-CM | POA: Diagnosis present

## 2015-11-16 DIAGNOSIS — R072 Precordial pain: Secondary | ICD-10-CM | POA: Diagnosis present

## 2015-11-16 DIAGNOSIS — F329 Major depressive disorder, single episode, unspecified: Secondary | ICD-10-CM | POA: Insufficient documentation

## 2015-11-16 DIAGNOSIS — R079 Chest pain, unspecified: Principal | ICD-10-CM | POA: Insufficient documentation

## 2015-11-16 DIAGNOSIS — M109 Gout, unspecified: Secondary | ICD-10-CM | POA: Diagnosis not present

## 2015-11-16 DIAGNOSIS — I10 Essential (primary) hypertension: Secondary | ICD-10-CM | POA: Diagnosis not present

## 2015-11-16 DIAGNOSIS — M1711 Unilateral primary osteoarthritis, right knee: Secondary | ICD-10-CM | POA: Insufficient documentation

## 2015-11-16 DIAGNOSIS — Z8249 Family history of ischemic heart disease and other diseases of the circulatory system: Secondary | ICD-10-CM | POA: Diagnosis not present

## 2015-11-16 DIAGNOSIS — K219 Gastro-esophageal reflux disease without esophagitis: Secondary | ICD-10-CM | POA: Diagnosis not present

## 2015-11-16 HISTORY — DX: Multiple fractures of ribs, unspecified side, initial encounter for closed fracture: S22.49XA

## 2015-11-16 HISTORY — DX: Unspecified fracture of sternum, initial encounter for closed fracture: S22.20XA

## 2015-11-16 HISTORY — DX: Personal history of (healed) traumatic fracture: Z87.81

## 2015-11-16 LAB — TROPONIN I
Troponin I: <0.03 ng/mL (ref <0.03)
Troponin I: <0.03 ng/mL (ref <0.03)

## 2015-11-16 LAB — BASIC METABOLIC PANEL
Anion gap: 9 (ref 5–15)
BUN: 13 mg/dL (ref 6–20)
CO2: 23 mmol/L (ref 22–32)
CREATININE: 0.9 mg/dL (ref 0.61–1.24)
Calcium: 8.9 mg/dL (ref 8.9–10.3)
Chloride: 106 mmol/L (ref 101–111)
GFR calc Af Amer: >60 mL/min (ref >60)
Glucose, Bld: 119 mg/dL — ABNORMAL HIGH (ref 65–99)
Potassium: 3.8 mmol/L (ref 3.5–5.1)
SODIUM: 138 mmol/L (ref 135–145)

## 2015-11-16 LAB — CBC
HCT: 39.1 % (ref 39.0–52.0)
Hemoglobin: 12.7 g/dL — ABNORMAL LOW (ref 13.0–17.0)
MCH: 29 pg (ref 26.0–34.0)
MCHC: 32.5 g/dL (ref 30.0–36.0)
MCV: 89.3 fL (ref 78.0–100.0)
PLATELETS: 215 10*3/uL (ref 150–400)
RBC: 4.38 MIL/uL (ref 4.22–5.81)
RDW: 13.2 % (ref 11.5–15.5)
WBC: 6.6 10*3/uL (ref 4.0–10.5)

## 2015-11-16 LAB — D-DIMER, QUANTITATIVE (NOT AT ARMC): D DIMER QUANT: 0.94 ug{FEU}/mL — AB (ref 0.00–0.50)

## 2015-11-16 LAB — I-STAT TROPONIN, ED: Troponin i, poc: 0 ng/mL (ref 0.00–0.08)

## 2015-11-16 LAB — ECHOCARDIOGRAM COMPLETE

## 2015-11-16 MED ORDER — LIDOCAINE 5 % EX PTCH
1.0000 | MEDICATED_PATCH | CUTANEOUS | Status: DC
  Administered 2015-11-16: 1 via TRANSDERMAL
  Filled 2015-11-16 (×2): qty 1

## 2015-11-16 MED ORDER — SIMVASTATIN 40 MG PO TABS: 40.0000 mg | ORAL_TABLET | Freq: Every evening | ORAL | Status: DC

## 2015-11-16 MED ORDER — ASPIRIN EC 325 MG PO TBEC
325.0000 mg | DELAYED_RELEASE_TABLET | Freq: Every day | ORAL | Status: DC
  Administered 2015-11-17: 325 mg via ORAL
  Filled 2015-11-16: qty 1

## 2015-11-16 MED ORDER — ALLOPURINOL 300 MG PO TABS
300.0000 mg | ORAL_TABLET | Freq: Every day | ORAL | Status: DC
  Administered 2015-11-16 – 2015-11-17 (×2): 300 mg via ORAL
  Filled 2015-11-16 (×2): qty 1

## 2015-11-16 MED ORDER — TRAMADOL HCL 50 MG PO TABS
100.0000 mg | ORAL_TABLET | Freq: Four times a day (QID) | ORAL | Status: DC
  Administered 2015-11-16 – 2015-11-17 (×4): 100 mg via ORAL
  Filled 2015-11-16 (×4): qty 2

## 2015-11-16 MED ORDER — ASPIRIN 81 MG PO CHEW
162.0000 mg | CHEWABLE_TABLET | Freq: Once | ORAL | Status: AC
  Administered 2015-11-16: 162 mg via ORAL
  Filled 2015-11-16: qty 2

## 2015-11-16 MED ORDER — METHOCARBAMOL 500 MG PO TABS
1000.0000 mg | ORAL_TABLET | Freq: Four times a day (QID) | ORAL | Status: DC
  Administered 2015-11-16 – 2015-11-17 (×4): 1000 mg via ORAL
  Filled 2015-11-16 (×4): qty 2

## 2015-11-16 MED ORDER — ACETAMINOPHEN 325 MG PO TABS: 650.0000 mg | ORAL_TABLET | ORAL | Status: DC | PRN

## 2015-11-16 MED ORDER — POLYETHYLENE GLYCOL 3350 17 G PO PACK
17.0000 g | PACK | Freq: Every day | ORAL | Status: DC
  Administered 2015-11-16: 17 g via ORAL
  Filled 2015-11-16: qty 1

## 2015-11-16 MED ORDER — CLONIDINE HCL 0.1 MG PO TABS
0.1000 mg | ORAL_TABLET | Freq: Three times a day (TID) | ORAL | Status: DC
  Administered 2015-11-16 – 2015-11-17 (×3): 0.1 mg via ORAL
  Filled 2015-11-16 (×3): qty 1

## 2015-11-16 MED ORDER — ADULT MULTIVITAMIN W/MINERALS CH
1.0000 | ORAL_TABLET | Freq: Every day | ORAL | Status: DC
  Administered 2015-11-16 – 2015-11-17 (×2): 1 via ORAL
  Filled 2015-11-16 (×2): qty 1

## 2015-11-16 MED ORDER — OMEPRAZOLE MAGNESIUM 20 MG PO TBEC: 20.0000 mg | DELAYED_RELEASE_TABLET | Freq: Every day | ORAL | Status: DC

## 2015-11-16 MED ORDER — IOPAMIDOL (ISOVUE-370) INJECTION 76%
100.0000 mL | Freq: Once | INTRAVENOUS | Status: AC | PRN
  Administered 2015-11-16: 100 mL via INTRAVENOUS

## 2015-11-16 MED ORDER — SIMVASTATIN 40 MG PO TABS
40.0000 mg | ORAL_TABLET | Freq: Every day | ORAL | Status: DC
  Administered 2015-11-16: 40 mg via ORAL
  Filled 2015-11-16: qty 1

## 2015-11-16 MED ORDER — ALPRAZOLAM 0.5 MG PO TABS: 0.5000 mg | ORAL_TABLET | Freq: Every day | ORAL | Status: DC | PRN

## 2015-11-16 MED ORDER — PANTOPRAZOLE SODIUM 40 MG PO TBEC
40.0000 mg | DELAYED_RELEASE_TABLET | Freq: Every day | ORAL | Status: DC
  Administered 2015-11-16 – 2015-11-17 (×2): 40 mg via ORAL
  Filled 2015-11-16 (×2): qty 1

## 2015-11-16 MED ORDER — SODIUM CHLORIDE 0.9 % IV SOLN
INTRAVENOUS | Status: DC
  Administered 2015-11-16: 17:00:00 via INTRAVENOUS

## 2015-11-16 MED ORDER — ONDANSETRON HCL 4 MG/2ML IJ SOLN: 4.0000 mg | Freq: Four times a day (QID) | INTRAMUSCULAR | Status: DC | PRN

## 2015-11-16 MED ORDER — LISINOPRIL 20 MG PO TABS
40.0000 mg | ORAL_TABLET | Freq: Every day | ORAL | Status: DC
  Administered 2015-11-16 – 2015-11-17 (×2): 40 mg via ORAL
  Filled 2015-11-16 (×2): qty 2

## 2015-11-16 MED ORDER — GABAPENTIN 300 MG PO CAPS
600.0000 mg | ORAL_CAPSULE | Freq: Three times a day (TID) | ORAL | Status: DC
  Administered 2015-11-16 – 2015-11-17 (×3): 600 mg via ORAL
  Filled 2015-11-16 (×3): qty 2

## 2015-11-16 MED ORDER — HEPARIN SODIUM (PORCINE) 5000 UNIT/ML IJ SOLN
5000.0000 [IU] | Freq: Three times a day (TID) | INTRAMUSCULAR | Status: DC
  Administered 2015-11-16 – 2015-11-17 (×2): 5000 [IU] via SUBCUTANEOUS
  Filled 2015-11-16 (×2): qty 1

## 2015-11-16 NOTE — Progress Notes (Signed)
  Echocardiogram 2D Echocardiogram has been performed.  David Hartman 11/16/2015, 1:52 PM
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
## 2015-11-16 NOTE — ED Notes (Signed)
Pt notified that there are still no rooms upstairs. Recliner placed in pt room for comfort. Pt also provided with HH meal tray. Pt sitting on side of the bed eating at this time

## 2015-11-16 NOTE — ED Notes (Signed)
Pt family advised that there is a delay in obtaining beds upstairs

## 2015-11-16 NOTE — ED Triage Notes (Signed)
Pt states he woke this morning around 3 am and had some pain in the right side of his chest  Pt states by 5am he had pain in both the right and left side of his chest  Pt states he checked his blood pressure at home and it was elevated  Pt states he has a dry mouth

## 2015-11-17 DIAGNOSIS — I10 Essential (primary) hypertension: Secondary | ICD-10-CM | POA: Diagnosis not present

## 2015-11-17 LAB — TROPONIN I

## 2015-11-17 NOTE — Discharge Instructions (Signed)
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
## 2015-11-30 ENCOUNTER — Telehealth: Payer: Self-pay | Admitting: Physical Medicine & Rehabilitation

## 2015-11-30 NOTE — Telephone Encounter (Signed)
Sharyn Lull with [employer] needs to get office notes on patient from 11/12/15 to present.  She also gave a code of [id number].  Please call her at [phone].

## 2015-12-18 ENCOUNTER — Other Ambulatory Visit: Payer: Self-pay | Admitting: Physical Medicine & Rehabilitation

## 2015-12-19 ENCOUNTER — Other Ambulatory Visit: Payer: Self-pay | Admitting: *Deleted

## 2015-12-19 MED ORDER — GABAPENTIN 600 MG PO TABS: 600.0000 mg | ORAL_TABLET | Freq: Three times a day (TID) | ORAL | 0 refills | Status: DC

## 2015-12-20 ENCOUNTER — Other Ambulatory Visit: Payer: Self-pay | Admitting: Physical Medicine & Rehabilitation

## 2015-12-31 IMAGING — CT CT RENAL STONE PROTOCOL
2 of 4 series · 17 of 46 positions shown, 19 images · non-contrast
Comparison: September 18, 2013

CLINICAL DATA: Bilateral flank pain, worse on left-sided. Patient
has history of multiple renal stones.

EXAM:
CT ABDOMEN AND PELVIS WITHOUT CONTRAST
TECHNIQUE: Multidetector CT imaging of the abdomen and pelvis was performed
following the standard protocol without IV contrast.

[Series 2: standard/full over (age)lbs 5.0 · axial · 0.94mm/px · z∈[-410,+24]mm · 14 of 95 slices shown, 16 images]
[im 4/95  soft-tissue]
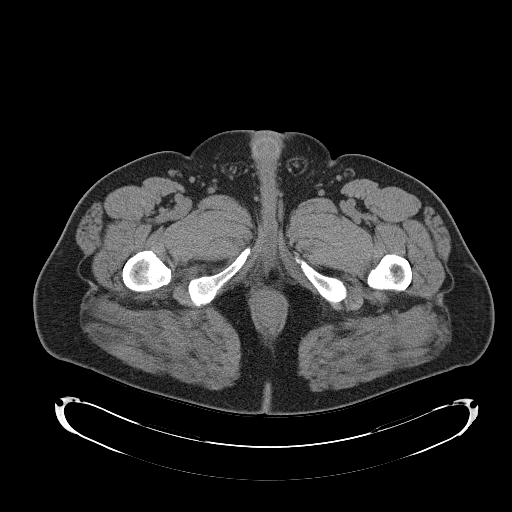
[im 4/95  bone]
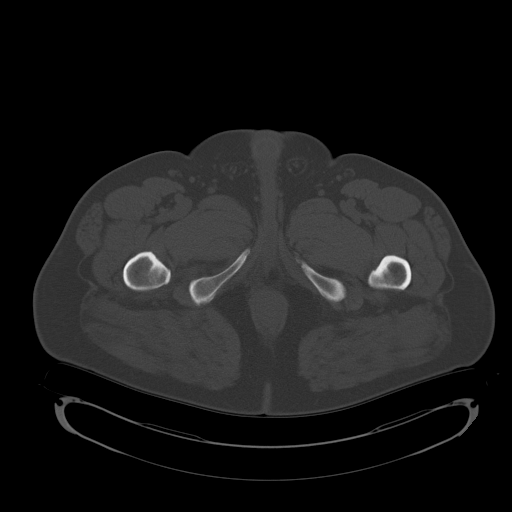
[im 12/95  soft-tissue]
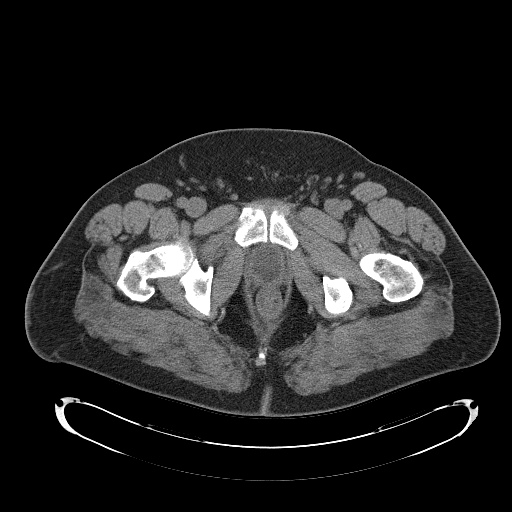
[im 20/95  soft-tissue]
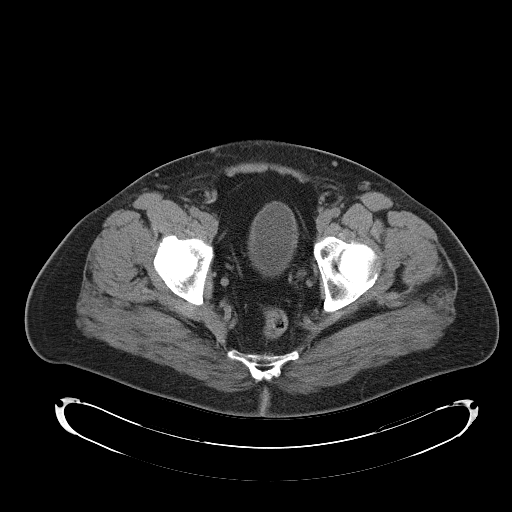
[im 24/95  soft-tissue]
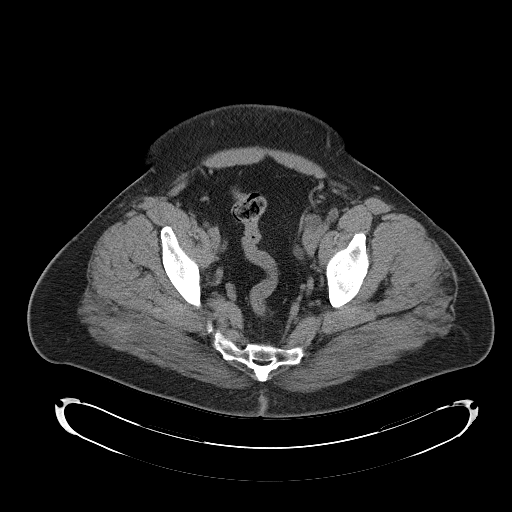
[im 32/95  soft-tissue]
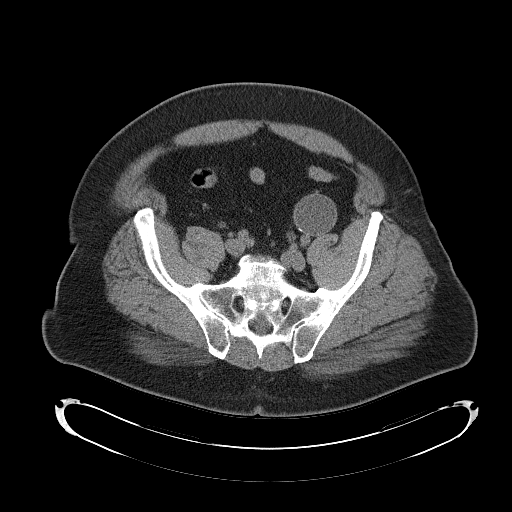
[im 40/95  soft-tissue]
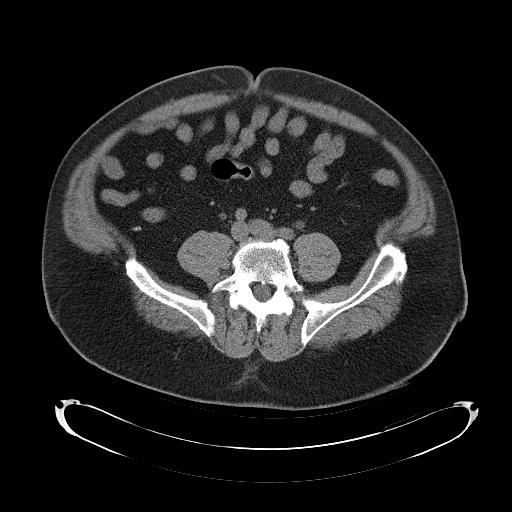
[im 44/95  soft-tissue]
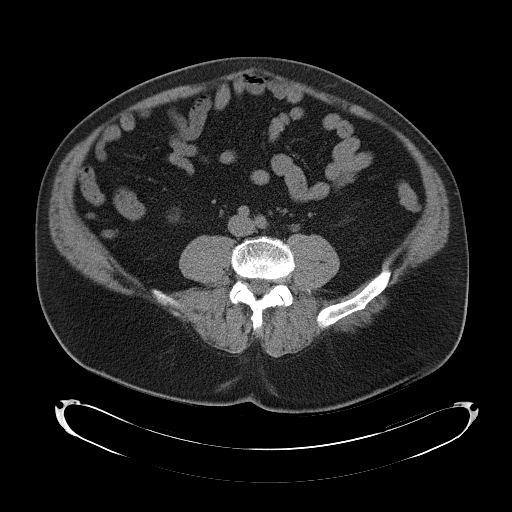
[im 51/95  soft-tissue]
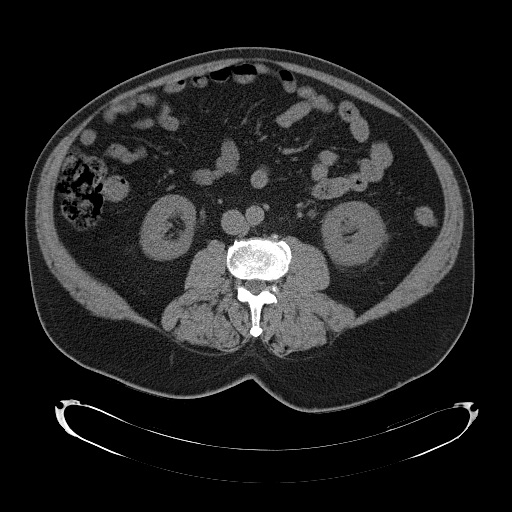
[im 55/95  soft-tissue]
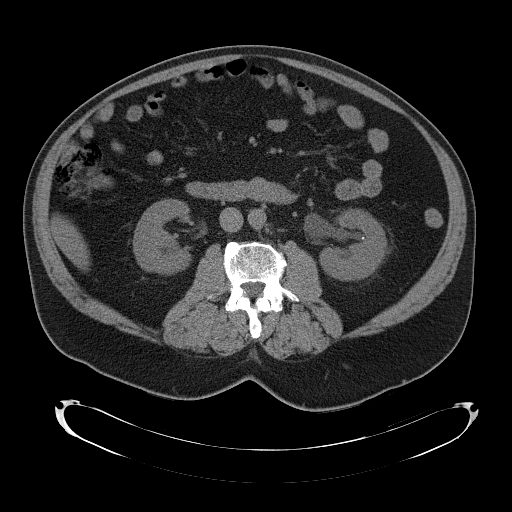
[im 55/95  bone]
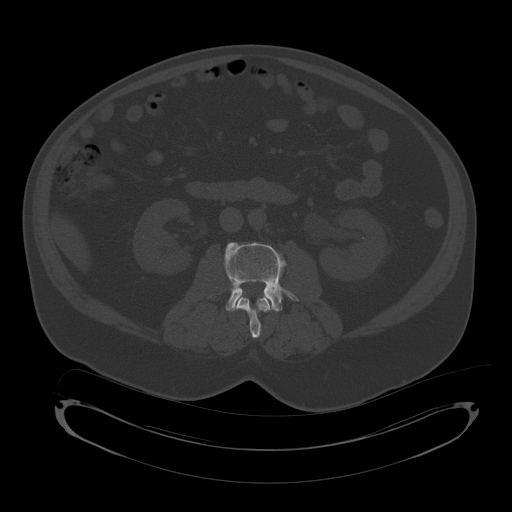
[im 63/95  soft-tissue]
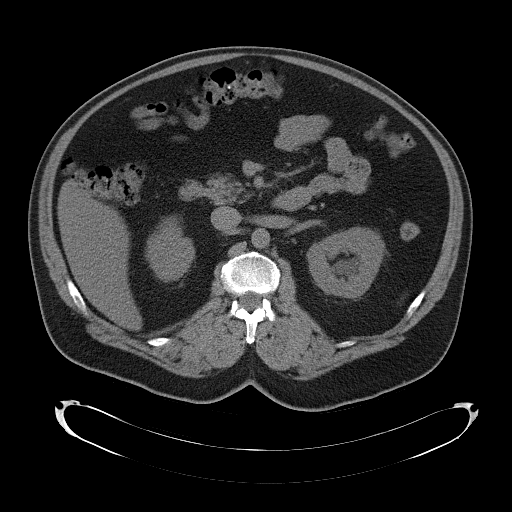
[im 71/95  soft-tissue]
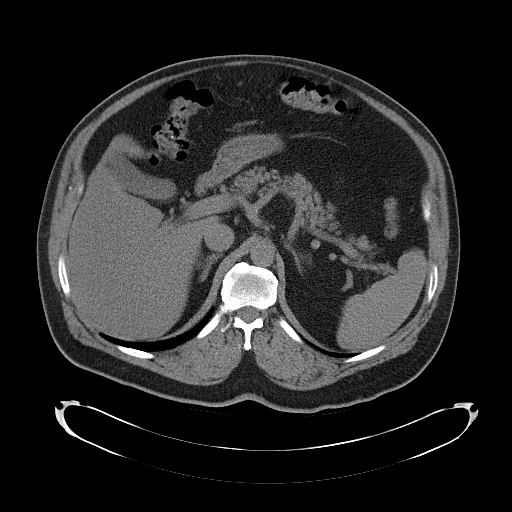
[im 75/95  soft-tissue]
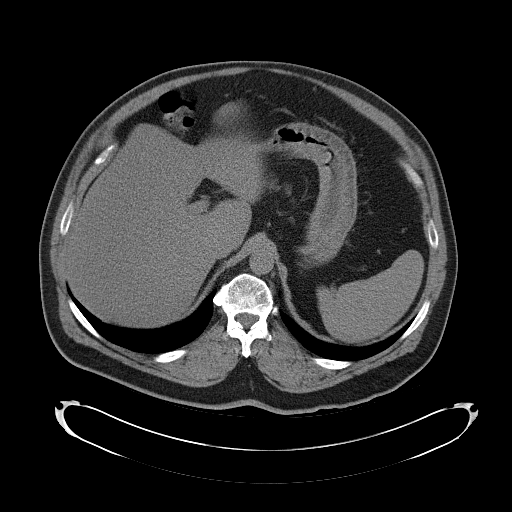
[im 83/95  soft-tissue]
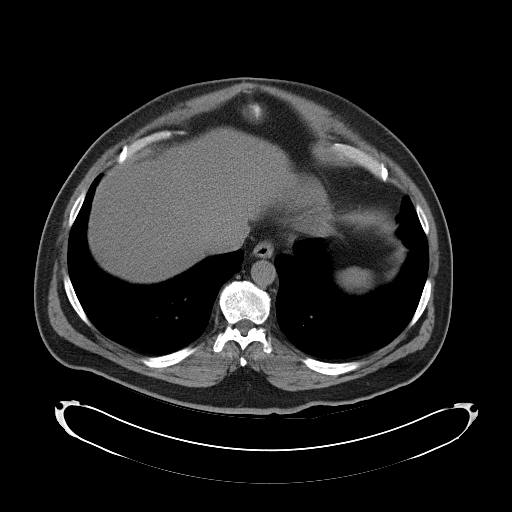
[im 91/95  soft-tissue]
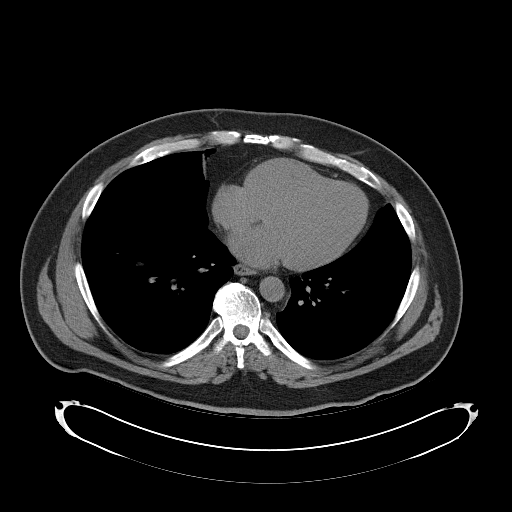

[Series 4: mpr coronal · coronal · 0.83mm/px · 3 of 107 slices shown]
[im 36/107  soft-tissue]
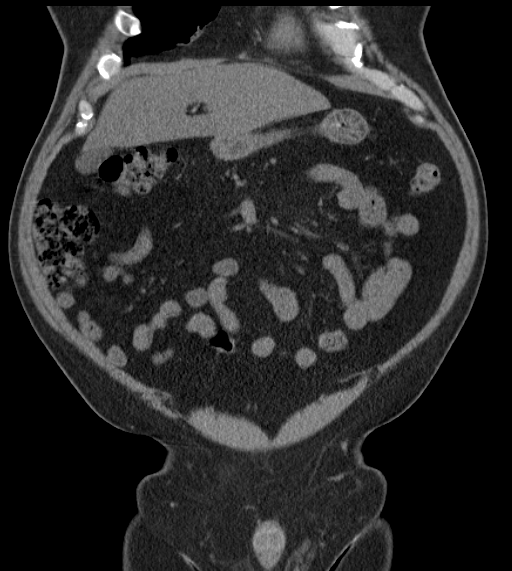
[im 48/107  soft-tissue]
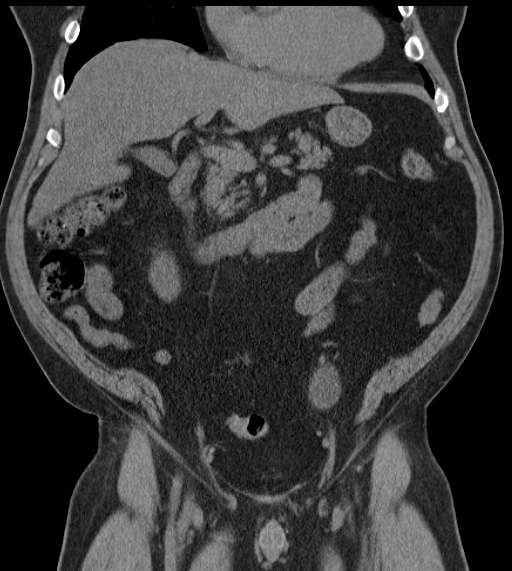
[im 59/107  soft-tissue]
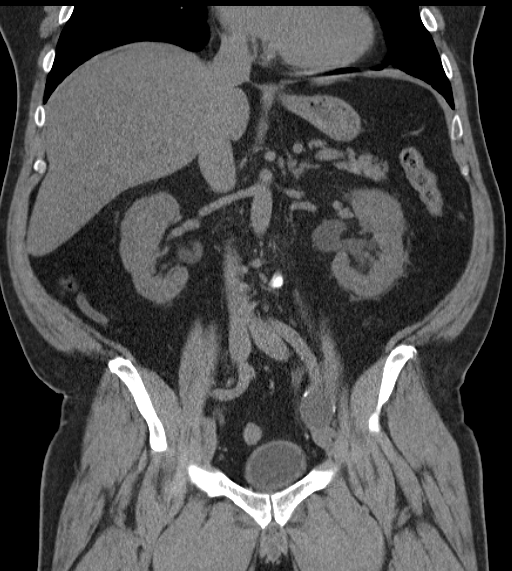

[17 of 46 positions shown; findings below may reference images not displayed]

FINDINGS: There is moderate left hydroureteronephrosis due to obstruction by 6
mm stone in the distal left ureter just proximal to the
ureterovesicular junction. There are small nonobstructing stones in
bilateral kidneys. There is no right hydronephrosis. Left peri
nephric stranding is identified.

There is diffuse low density of the liver. The spleen, pancreas,
gallbladder, adrenal glands are normal. There is minimal
atherosclerosis of the abdominal aorta without aneurysmal
dilatation. There is no abdominal lymphadenopathy. There is no small
bowel obstruction or diverticulitis. The appendix is normal.

Partial fluid-filled bladder is normal. Left pelvic cystic area is
unchanged. The visualized lung bases are clear. Degenerative joint
changes of the spine are noted.
IMPRESSION: Moderate left hydroureteronephrosis due to obstruction by 6 mm stone
in the distal left ureter just proximal to the ureterovesicular
junction.

## 2016-01-03 IMAGING — CT CT RENAL STONE PROTOCOL
2 of 4 series · 16 of 46 positions shown, 18 images · non-contrast
Comparison: CT of the abdomen and pelvis January 26, 2014

CLINICAL DATA: LEFT flank pain, persistent symptoms from 2 days
ago.

EXAM:
CT ABDOMEN AND PELVIS WITHOUT CONTRAST
TECHNIQUE: Multidetector CT imaging of the abdomen and pelvis was performed
following the standard protocol without IV contrast.

[Series 2: standard/full over (age)lbs 5.0 · axial · 0.87mm/px · z∈[-772,-327]mm · 13 of 99 slices shown, 15 images]
[im 5/99  soft-tissue]
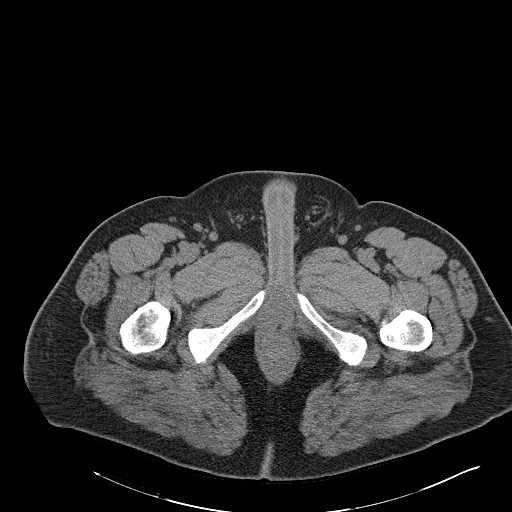
[im 5/99  bone]
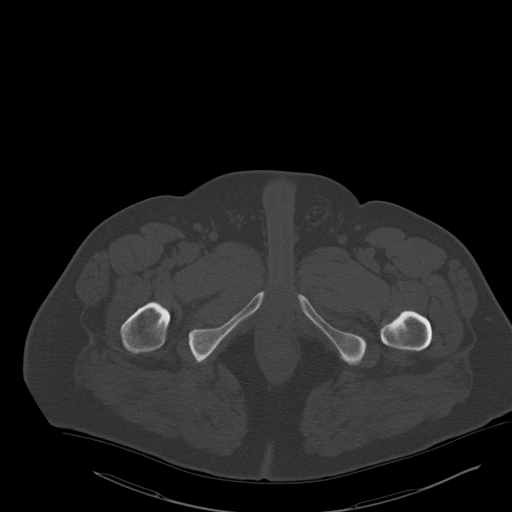
[im 13/99  soft-tissue]
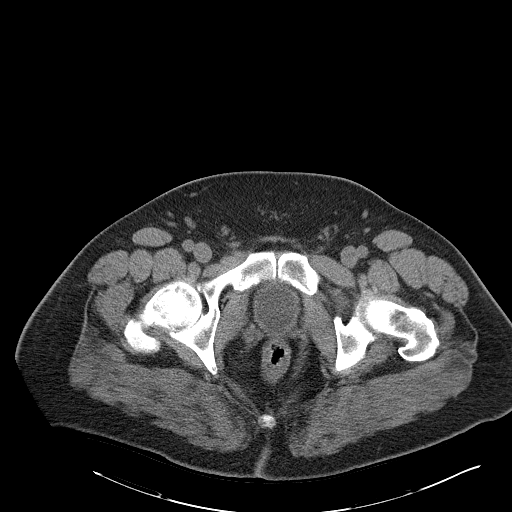
[im 22/99  soft-tissue]
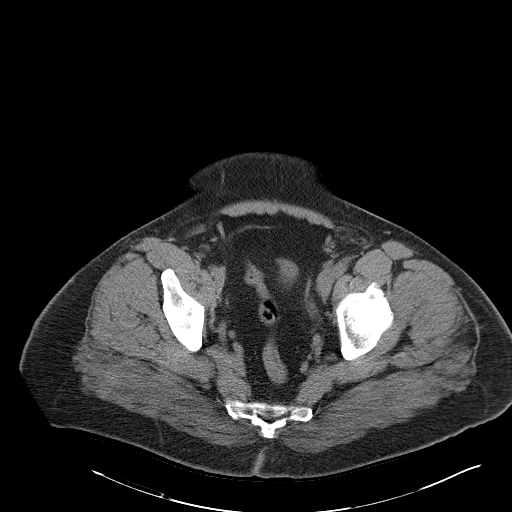
[im 26/99  soft-tissue]
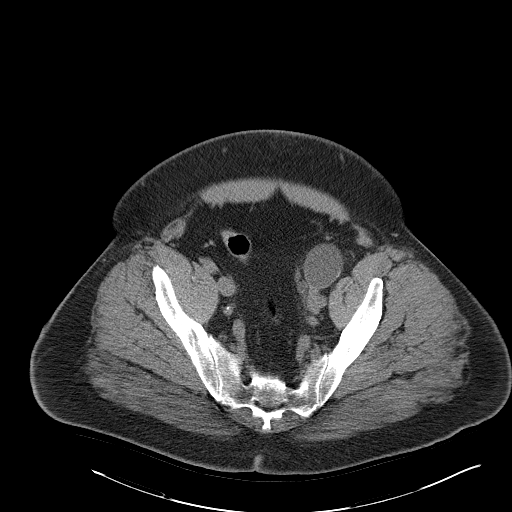
[im 35/99  soft-tissue]
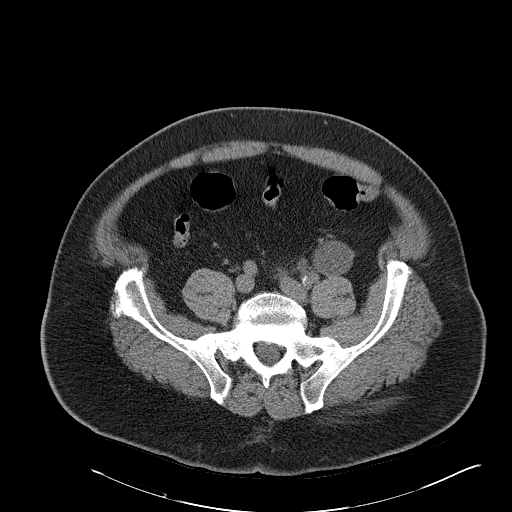
[im 43/99  soft-tissue]
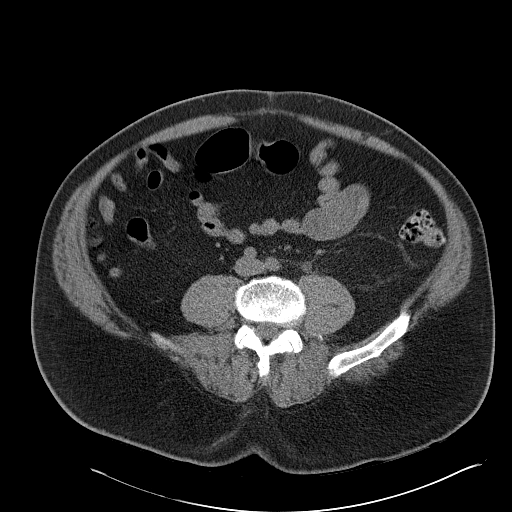
[im 52/99  soft-tissue]
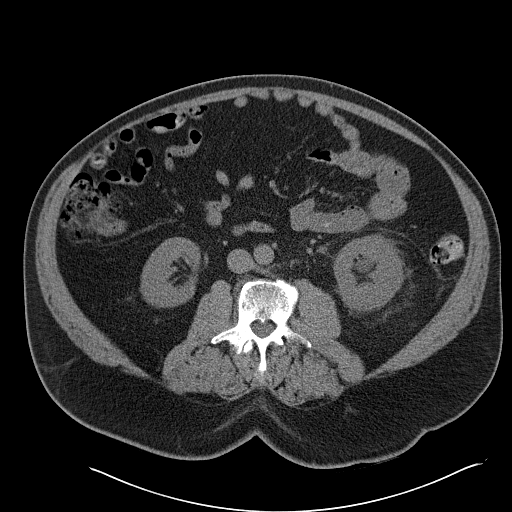
[im 56/99  soft-tissue]
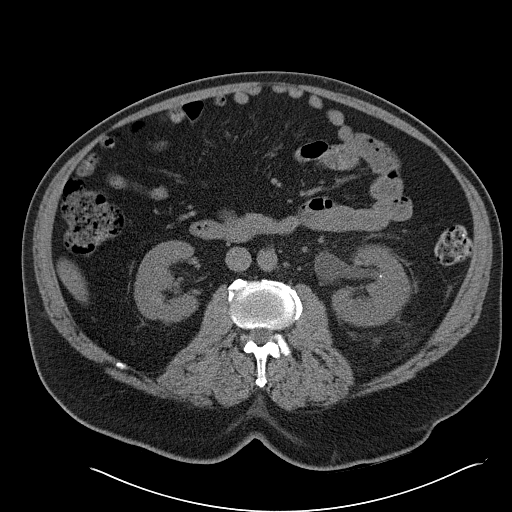
[im 64/99  soft-tissue]
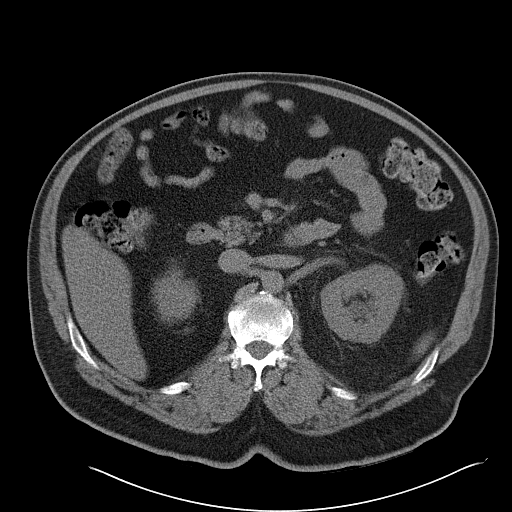
[im 64/99  bone]
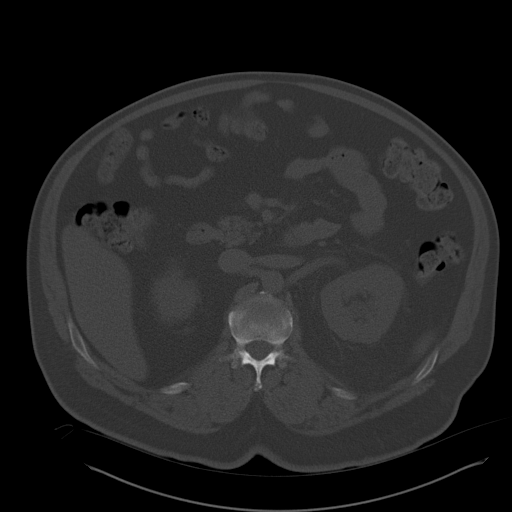
[im 73/99  soft-tissue]
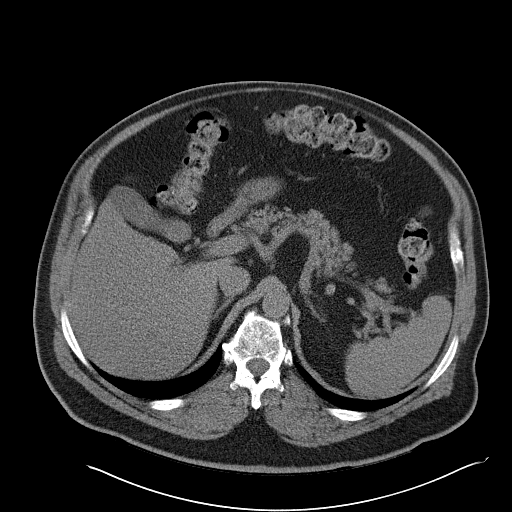
[im 77/99  soft-tissue]
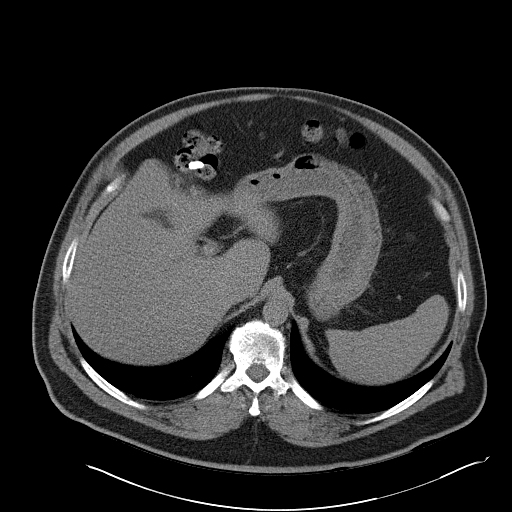
[im 86/99  soft-tissue]
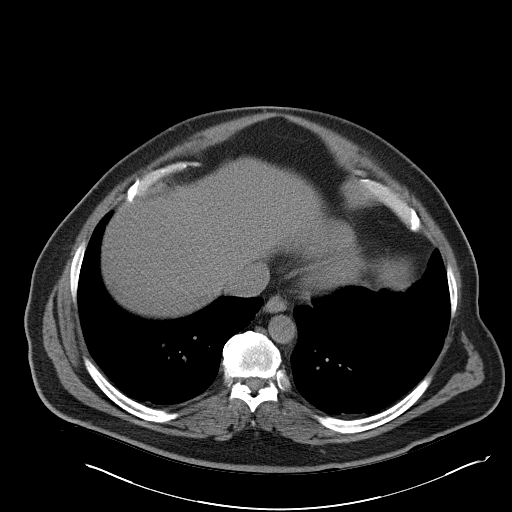
[im 94/99  soft-tissue]
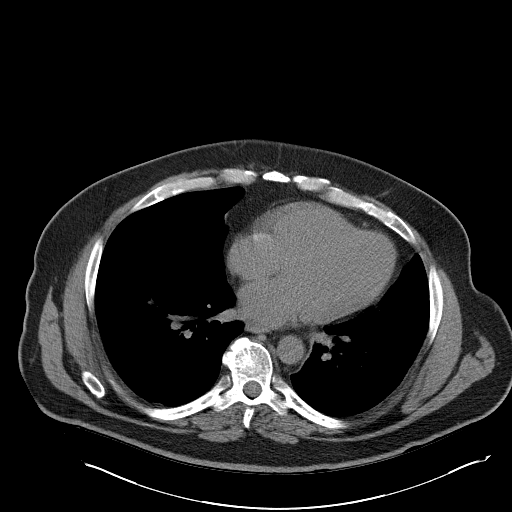

[Series 3: mpr coronal · coronal · 0.88mm/px · 3 of 125 slices shown]
[im 42/125  soft-tissue]
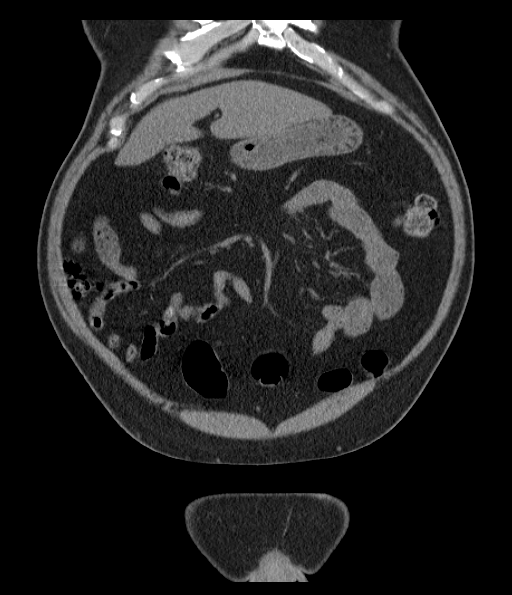
[im 56/125  soft-tissue]
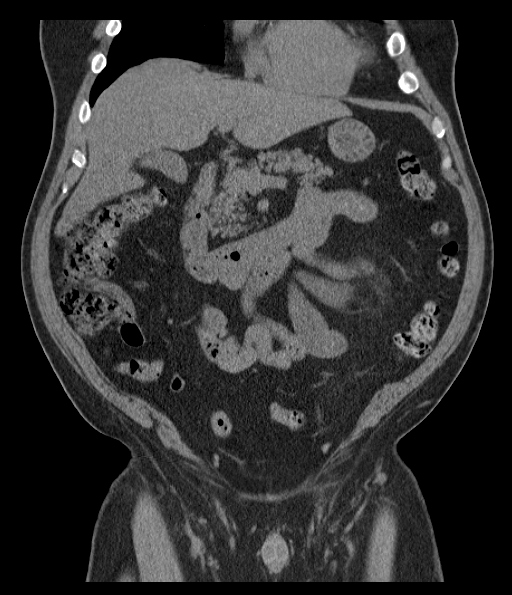
[im 69/125  soft-tissue]
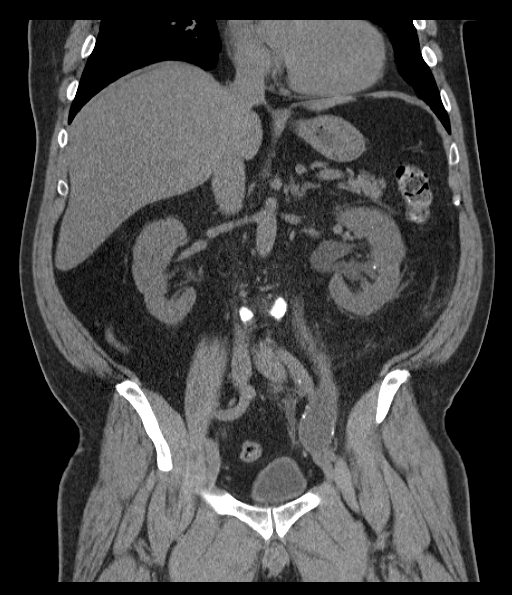

[16 of 46 positions shown; findings below may reference images not displayed]

FINDINGS: LUNG BASES: Included view of the lung bases are clear. The
visualized heart and pericardium are unremarkable.

KIDNEYS/BLADDER: Kidneys are orthotopic, demonstrating normal size
and morphology. Moderate LEFT hydroureteronephrosis to the level of
the distal ureter where a 6 mm calculus is again seen at the
ureterovesicular junction, unchanged. Punctate LEFT LEFT lower pole
nephrolithiasis. Limited assessment for renal masses on this
nonenhanced examination. Increasing LEFT periureteral inflammation.
Urinary bladder is partially distended and unremarkable.

SOLID ORGANS: The liver, spleen, gallbladder, pancreas and adrenal
glands are unremarkable for this non-contrast examination.

GASTROINTESTINAL TRACT: The stomach, small and large bowel are
normal in course and caliber without inflammatory changes, the
sensitivity may be decreased by lack of enteric contrast.
Subcentimeter appendicolith without CT findings of acute
appendicitis. Mild rectal wall thickening may reflect internal
hemorrhage or sequelae of prostate treatment.

PERITONEUM/RETROPERITONEUM: No intraperitoneal free fluid nor free
air. Aortoiliac vessels are normal in course and caliber. No
lymphadenopathy by CT size criteria. Prostate appears surgically
absent. 4.5 cm tubular cystic structure along the LEFT pelvic
sidewall with peripheral calcifications, unchanged and likely
reflects lymphangioma.

SOFT TISSUES/ OSSEOUS STRUCTURES: Nonsuspicious. Small fat
containing inguinal hernias. Bridging sacroiliac osteophytes.
Moderate degenerative change of the lumbar spine.
IMPRESSION: Similar moderate LEFT hydroureteronephrosis with 6 mm obstructing
calculus at ureterovesicular junction. Increasing LEFT periureteral
inflammation may reflect urinary tract infection. Residual punctate
LEFT lower pole nephrolithiasis.

  By: Giorgi Jumper

## 2016-01-10 ENCOUNTER — Encounter: Payer: Self-pay | Admitting: Physical Medicine & Rehabilitation

## 2016-01-10 ENCOUNTER — Encounter
Payer: Worker's Compensation | Attending: Physical Medicine & Rehabilitation | Admitting: Physical Medicine & Rehabilitation

## 2016-01-10 VITALS — BP 131/78 | HR 83

## 2016-01-10 DIAGNOSIS — Z960 Presence of urogenital implants: Secondary | ICD-10-CM | POA: Diagnosis not present

## 2016-01-10 DIAGNOSIS — Z800 Family history of malignant neoplasm of digestive organs: Secondary | ICD-10-CM | POA: Insufficient documentation

## 2016-01-10 DIAGNOSIS — G8918 Other acute postprocedural pain: Secondary | ICD-10-CM

## 2016-01-10 DIAGNOSIS — Z87828 Personal history of other (healed) physical injury and trauma: Secondary | ICD-10-CM | POA: Insufficient documentation

## 2016-01-10 DIAGNOSIS — I10 Essential (primary) hypertension: Secondary | ICD-10-CM | POA: Diagnosis not present

## 2016-01-10 DIAGNOSIS — G8929 Other chronic pain: Secondary | ICD-10-CM | POA: Insufficient documentation

## 2016-01-10 DIAGNOSIS — S01311S Laceration without foreign body of right ear, sequela: Secondary | ICD-10-CM | POA: Diagnosis not present

## 2016-01-10 DIAGNOSIS — M546 Pain in thoracic spine: Secondary | ICD-10-CM

## 2016-01-10 DIAGNOSIS — G4700 Insomnia, unspecified: Secondary | ICD-10-CM | POA: Diagnosis not present

## 2016-01-10 DIAGNOSIS — Z9889 Other specified postprocedural states: Secondary | ICD-10-CM | POA: Insufficient documentation

## 2016-01-10 DIAGNOSIS — S01311A Laceration without foreign body of right ear, initial encounter: Secondary | ICD-10-CM | POA: Diagnosis not present

## 2016-01-10 DIAGNOSIS — Z823 Family history of stroke: Secondary | ICD-10-CM | POA: Diagnosis not present

## 2016-01-10 DIAGNOSIS — Z85828 Personal history of other malignant neoplasm of skin: Secondary | ICD-10-CM | POA: Diagnosis not present

## 2016-01-10 DIAGNOSIS — Z8249 Family history of ischemic heart disease and other diseases of the circulatory system: Secondary | ICD-10-CM | POA: Insufficient documentation

## 2016-01-10 DIAGNOSIS — M109 Gout, unspecified: Secondary | ICD-10-CM | POA: Diagnosis not present

## 2016-01-10 DIAGNOSIS — E7800 Pure hypercholesterolemia, unspecified: Secondary | ICD-10-CM | POA: Diagnosis not present

## 2016-01-10 DIAGNOSIS — K219 Gastro-esophageal reflux disease without esophagitis: Secondary | ICD-10-CM | POA: Insufficient documentation

## 2016-01-10 DIAGNOSIS — Z8546 Personal history of malignant neoplasm of prostate: Secondary | ICD-10-CM | POA: Diagnosis not present

## 2016-01-10 DIAGNOSIS — X58XXXA Exposure to other specified factors, initial encounter: Secondary | ICD-10-CM | POA: Insufficient documentation

## 2016-01-10 DIAGNOSIS — M549 Dorsalgia, unspecified: Secondary | ICD-10-CM | POA: Diagnosis not present

## 2016-01-10 DIAGNOSIS — R269 Unspecified abnormalities of gait and mobility: Secondary | ICD-10-CM | POA: Diagnosis not present

## 2016-01-10 DIAGNOSIS — M5416 Radiculopathy, lumbar region: Secondary | ICD-10-CM

## 2016-01-10 MED ORDER — TRAMADOL HCL 50 MG PO TABS: 50.0000 mg | ORAL_TABLET | Freq: Four times a day (QID) | ORAL | 0 refills | Status: AC
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
## 2016-01-20 ENCOUNTER — Other Ambulatory Visit: Payer: Self-pay | Admitting: Physical Medicine & Rehabilitation

## 2016-01-22 DIAGNOSIS — H61301 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, unspecified: Secondary | ICD-10-CM | POA: Insufficient documentation

## 2016-02-12 ENCOUNTER — Other Ambulatory Visit: Payer: Self-pay | Admitting: Physical Medicine & Rehabilitation

## 2016-02-21 ENCOUNTER — Encounter: Payer: 59 | Admitting: Physical Medicine & Rehabilitation

## 2016-02-26 ENCOUNTER — Other Ambulatory Visit: Payer: Self-pay | Admitting: Physical Medicine & Rehabilitation

## 2016-02-29 ENCOUNTER — Encounter
Payer: Worker's Compensation | Attending: Physical Medicine & Rehabilitation | Admitting: Physical Medicine & Rehabilitation

## 2016-02-29 VITALS — BP 131/80 | HR 80

## 2016-02-29 DIAGNOSIS — T1490XA Injury, unspecified, initial encounter: Secondary | ICD-10-CM

## 2016-02-29 DIAGNOSIS — Z87442 Personal history of urinary calculi: Secondary | ICD-10-CM | POA: Insufficient documentation

## 2016-02-29 DIAGNOSIS — Z823 Family history of stroke: Secondary | ICD-10-CM | POA: Insufficient documentation

## 2016-02-29 DIAGNOSIS — M109 Gout, unspecified: Secondary | ICD-10-CM | POA: Diagnosis not present

## 2016-02-29 DIAGNOSIS — Z8249 Family history of ischemic heart disease and other diseases of the circulatory system: Secondary | ICD-10-CM | POA: Diagnosis not present

## 2016-02-29 DIAGNOSIS — M5416 Radiculopathy, lumbar region: Secondary | ICD-10-CM

## 2016-02-29 DIAGNOSIS — Z8546 Personal history of malignant neoplasm of prostate: Secondary | ICD-10-CM | POA: Insufficient documentation

## 2016-02-29 DIAGNOSIS — S01311S Laceration without foreign body of right ear, sequela: Secondary | ICD-10-CM | POA: Diagnosis not present

## 2016-02-29 DIAGNOSIS — E7800 Pure hypercholesterolemia, unspecified: Secondary | ICD-10-CM | POA: Diagnosis not present

## 2016-02-29 DIAGNOSIS — Z9889 Other specified postprocedural states: Secondary | ICD-10-CM | POA: Insufficient documentation

## 2016-02-29 DIAGNOSIS — G8918 Other acute postprocedural pain: Secondary | ICD-10-CM | POA: Diagnosis not present

## 2016-02-29 DIAGNOSIS — Z9079 Acquired absence of other genital organ(s): Secondary | ICD-10-CM | POA: Insufficient documentation

## 2016-02-29 DIAGNOSIS — X58XXXS Exposure to other specified factors, sequela: Secondary | ICD-10-CM | POA: Diagnosis not present

## 2016-02-29 DIAGNOSIS — G4700 Insomnia, unspecified: Secondary | ICD-10-CM | POA: Diagnosis not present

## 2016-02-29 DIAGNOSIS — Z800 Family history of malignant neoplasm of digestive organs: Secondary | ICD-10-CM | POA: Insufficient documentation

## 2016-02-29 DIAGNOSIS — R269 Unspecified abnormalities of gait and mobility: Secondary | ICD-10-CM | POA: Diagnosis not present

## 2016-02-29 DIAGNOSIS — I10 Essential (primary) hypertension: Secondary | ICD-10-CM | POA: Diagnosis not present

## 2016-02-29 DIAGNOSIS — M546 Pain in thoracic spine: Secondary | ICD-10-CM | POA: Insufficient documentation

## 2016-02-29 DIAGNOSIS — Z85828 Personal history of other malignant neoplasm of skin: Secondary | ICD-10-CM | POA: Insufficient documentation

## 2016-02-29 DIAGNOSIS — K219 Gastro-esophageal reflux disease without esophagitis: Secondary | ICD-10-CM | POA: Insufficient documentation

## 2016-02-29 MED ORDER — TRAMADOL HCL 50 MG PO TABS: 50.0000 mg | ORAL_TABLET | Freq: Four times a day (QID) | ORAL | 0 refills | Status: DC | PRN

## 2016-02-29 MED ORDER — AMITRIPTYLINE HCL 10 MG PO TABS: 10.0000 mg | ORAL_TABLET | Freq: Every day | ORAL | 1 refills | Status: DC
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
## 2016-03-10 ENCOUNTER — Telehealth: Payer: Self-pay | Admitting: Physical Medicine & Rehabilitation

## 2016-03-10 NOTE — Telephone Encounter (Signed)
Call from [facility] case manager work comp needs written work status note

## 2016-03-22 ENCOUNTER — Emergency Department (HOSPITAL_COMMUNITY): Payer: BLUE CROSS/BLUE SHIELD

## 2016-03-22 ENCOUNTER — Emergency Department (HOSPITAL_COMMUNITY)
Admission: EM | Admit: 2016-03-22 | Discharge: 2016-03-22 | Disposition: A | Discharge status: Home / Self Care | Payer: BLUE CROSS/BLUE SHIELD | Attending: Emergency Medicine | Admitting: Emergency Medicine

## 2016-03-22 ENCOUNTER — Encounter (HOSPITAL_COMMUNITY): Payer: Self-pay | Admitting: *Deleted

## 2016-03-22 DIAGNOSIS — Z79899 Other long term (current) drug therapy: Secondary | ICD-10-CM | POA: Diagnosis not present

## 2016-03-22 DIAGNOSIS — I10 Essential (primary) hypertension: Secondary | ICD-10-CM | POA: Insufficient documentation

## 2016-03-22 DIAGNOSIS — S51812A Laceration without foreign body of left forearm, initial encounter: Secondary | ICD-10-CM | POA: Insufficient documentation

## 2016-03-22 DIAGNOSIS — W06XXXA Fall from bed, initial encounter: Secondary | ICD-10-CM | POA: Insufficient documentation

## 2016-03-22 DIAGNOSIS — Y999 Unspecified external cause status: Secondary | ICD-10-CM | POA: Insufficient documentation

## 2016-03-22 DIAGNOSIS — Y9389 Activity, other specified: Secondary | ICD-10-CM | POA: Diagnosis not present

## 2016-03-22 DIAGNOSIS — Y9289 Other specified places as the place of occurrence of the external cause: Secondary | ICD-10-CM | POA: Insufficient documentation

## 2016-03-22 DIAGNOSIS — W19XXXA Unspecified fall, initial encounter: Secondary | ICD-10-CM

## 2016-03-22 DIAGNOSIS — Z8546 Personal history of malignant neoplasm of prostate: Secondary | ICD-10-CM | POA: Diagnosis not present

## 2016-03-22 LAB — I-STAT CHEM 8, ED
BUN: 15 mg/dL (ref 6–20)
CALCIUM ION: 1.14 mmol/L — AB (ref 1.15–1.40)
CREATININE: 0.9 mg/dL (ref 0.61–1.24)
Chloride: 106 mmol/L (ref 101–111)
GLUCOSE: 112 mg/dL — AB (ref 65–99)
HCT: 38 % — ABNORMAL LOW (ref 39.0–52.0)
HEMOGLOBIN: 12.9 g/dL — AB (ref 13.0–17.0)
Potassium: 4 mmol/L (ref 3.5–5.1)
Sodium: 140 mmol/L (ref 135–145)
TCO2: 26 mmol/L (ref 0–100)

## 2016-03-22 MED ORDER — OXYCODONE-ACETAMINOPHEN 5-325 MG PO TABS
2.0000 | ORAL_TABLET | Freq: Once | ORAL | Status: AC
  Administered 2016-03-22: 1 via ORAL
  Filled 2016-03-22: qty 2

## 2016-03-22 MED ORDER — OXYCODONE-ACETAMINOPHEN 5-325 MG PO TABS
1.0000 | ORAL_TABLET | Freq: Once | ORAL | Status: AC
  Administered 2016-03-22: 1 via ORAL
  Filled 2016-03-22: qty 1

## 2016-03-22 MED ORDER — IOPAMIDOL (ISOVUE-300) INJECTION 61%
INTRAVENOUS | Status: AC
  Administered 2016-03-22: 100 mL
  Filled 2016-03-22: qty 100

## 2016-03-22 MED ORDER — ONDANSETRON 4 MG PO TBDP
4.0000 mg | ORAL_TABLET | Freq: Once | ORAL | Status: AC
  Administered 2016-03-22: 4 mg via ORAL
  Filled 2016-03-22: qty 1

## 2016-03-22 MED ORDER — OXYCODONE-ACETAMINOPHEN 5-325 MG PO TABS: 1.0000 | ORAL_TABLET | ORAL | 0 refills | Status: DC | PRN

## 2016-03-22 NOTE — ED Notes (Signed)
pts name moved off the floor by mistake

## 2016-03-22 NOTE — ED Triage Notes (Signed)
The pt fell out of bed just pta  He fell against something and lacerated his lt forearm  And sharp pain in his lateral abd.  Just before he went to bed he took ultram xanax and other night time meds

## 2016-03-22 NOTE — ED Notes (Signed)
Patient transported to CT 

## 2016-03-22 NOTE — Discharge Instructions (Signed)
Your CT scan showed no acute fractures. There is no internal organ damage. I'm discharging you with some increased pain medication over the next few days. Please follow up with her primary care physician if her pain is uncontrolled.
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
## 2016-03-22 NOTE — ED Notes (Signed)
Family at bedside. 

## 2016-03-22 NOTE — ED Notes (Signed)
1 percocet tablet dropped on floor prior to administering to patient, percocet wasted with woody, RN, PA Harris made aware of need for order for 1 tablet of percocet so that patient will receive original order of 2 tablets.

## 2016-03-27 ENCOUNTER — Encounter
Payer: Worker's Compensation | Attending: Physical Medicine & Rehabilitation | Admitting: Physical Medicine & Rehabilitation

## 2016-03-27 ENCOUNTER — Encounter: Payer: Self-pay | Admitting: Physical Medicine & Rehabilitation

## 2016-03-27 VITALS — BP 155/93 | HR 70 | Resp 14

## 2016-03-27 DIAGNOSIS — X58XXXS Exposure to other specified factors, sequela: Secondary | ICD-10-CM | POA: Insufficient documentation

## 2016-03-27 DIAGNOSIS — T1490XA Injury, unspecified, initial encounter: Secondary | ICD-10-CM | POA: Diagnosis not present

## 2016-03-27 DIAGNOSIS — Z79891 Long term (current) use of opiate analgesic: Secondary | ICD-10-CM | POA: Diagnosis not present

## 2016-03-27 DIAGNOSIS — W06XXXS Fall from bed, sequela: Secondary | ICD-10-CM | POA: Diagnosis not present

## 2016-03-27 DIAGNOSIS — M546 Pain in thoracic spine: Secondary | ICD-10-CM

## 2016-03-27 DIAGNOSIS — Z800 Family history of malignant neoplasm of digestive organs: Secondary | ICD-10-CM | POA: Diagnosis not present

## 2016-03-27 DIAGNOSIS — M25519 Pain in unspecified shoulder: Secondary | ICD-10-CM | POA: Diagnosis not present

## 2016-03-27 DIAGNOSIS — K219 Gastro-esophageal reflux disease without esophagitis: Secondary | ICD-10-CM | POA: Insufficient documentation

## 2016-03-27 DIAGNOSIS — G8929 Other chronic pain: Secondary | ICD-10-CM | POA: Insufficient documentation

## 2016-03-27 DIAGNOSIS — Z87442 Personal history of urinary calculi: Secondary | ICD-10-CM | POA: Insufficient documentation

## 2016-03-27 DIAGNOSIS — S01311S Laceration without foreign body of right ear, sequela: Secondary | ICD-10-CM | POA: Diagnosis not present

## 2016-03-27 DIAGNOSIS — G4700 Insomnia, unspecified: Secondary | ICD-10-CM | POA: Diagnosis not present

## 2016-03-27 DIAGNOSIS — M25561 Pain in right knee: Secondary | ICD-10-CM | POA: Insufficient documentation

## 2016-03-27 DIAGNOSIS — Z9079 Acquired absence of other genital organ(s): Secondary | ICD-10-CM | POA: Insufficient documentation

## 2016-03-27 DIAGNOSIS — G8918 Other acute postprocedural pain: Secondary | ICD-10-CM | POA: Diagnosis not present

## 2016-03-27 DIAGNOSIS — Z85828 Personal history of other malignant neoplasm of skin: Secondary | ICD-10-CM | POA: Diagnosis not present

## 2016-03-27 DIAGNOSIS — N529 Male erectile dysfunction, unspecified: Secondary | ICD-10-CM | POA: Insufficient documentation

## 2016-03-27 DIAGNOSIS — G479 Sleep disorder, unspecified: Secondary | ICD-10-CM | POA: Diagnosis not present

## 2016-03-27 DIAGNOSIS — E7800 Pure hypercholesterolemia, unspecified: Secondary | ICD-10-CM | POA: Insufficient documentation

## 2016-03-27 DIAGNOSIS — M109 Gout, unspecified: Secondary | ICD-10-CM | POA: Diagnosis not present

## 2016-03-27 DIAGNOSIS — F39 Unspecified mood [affective] disorder: Secondary | ICD-10-CM | POA: Insufficient documentation

## 2016-03-27 DIAGNOSIS — I10 Essential (primary) hypertension: Secondary | ICD-10-CM | POA: Diagnosis not present

## 2016-03-27 DIAGNOSIS — M7062 Trochanteric bursitis, left hip: Secondary | ICD-10-CM

## 2016-03-27 DIAGNOSIS — Z960 Presence of urogenital implants: Secondary | ICD-10-CM | POA: Insufficient documentation

## 2016-03-27 DIAGNOSIS — Z8249 Family history of ischemic heart disease and other diseases of the circulatory system: Secondary | ICD-10-CM | POA: Diagnosis not present

## 2016-03-27 DIAGNOSIS — Z823 Family history of stroke: Secondary | ICD-10-CM | POA: Diagnosis not present

## 2016-03-27 DIAGNOSIS — R269 Unspecified abnormalities of gait and mobility: Secondary | ICD-10-CM | POA: Diagnosis not present

## 2016-03-27 DIAGNOSIS — Z87828 Personal history of other (healed) physical injury and trauma: Secondary | ICD-10-CM | POA: Diagnosis not present

## 2016-03-27 DIAGNOSIS — Z8546 Personal history of malignant neoplasm of prostate: Secondary | ICD-10-CM | POA: Diagnosis not present

## 2016-03-27 DIAGNOSIS — Z9889 Other specified postprocedural states: Secondary | ICD-10-CM | POA: Insufficient documentation

## 2016-03-27 DIAGNOSIS — M7061 Trochanteric bursitis, right hip: Secondary | ICD-10-CM

## 2016-03-27 MED ORDER — TRAMADOL HCL 50 MG PO TABS: 50.0000 mg | ORAL_TABLET | Freq: Two times a day (BID) | ORAL | 1 refills | Status: DC

## 2016-03-27 MED ORDER — TRAZODONE HCL 50 MG PO TABS: 50.0000 mg | ORAL_TABLET | Freq: Every day | ORAL | 1 refills | Status: DC

## 2016-03-27 MED ORDER — METHOCARBAMOL 500 MG PO TABS: 500.0000 mg | ORAL_TABLET | Freq: Four times a day (QID) | ORAL | 1 refills | Status: DC

## 2016-03-27 MED ORDER — LIDOCAINE 5 % EX PTCH: 2.0000 | MEDICATED_PATCH | CUTANEOUS | 1 refills | Status: DC

## 2016-03-27 MED ORDER — DULOXETINE HCL 30 MG PO CPEP: 30.0000 mg | ORAL_CAPSULE | Freq: Every day | ORAL | 1 refills | Status: DC
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
## 2016-03-28 ENCOUNTER — Telehealth: Payer: Self-pay | Admitting: *Deleted

## 2016-03-28 NOTE — Telephone Encounter (Signed)
Patient left a message stating that he had a conversation with Dr. Posey Pronto about depression and the possibility of getting a referral to be seen for it.  He is asking if Dr. Posey Pronto would place a referral for his depression. Also, he asked about his tramadol refill. Was unable to get his medication.  I called him back to inform that he was given a paper script. He could not find that so I called in the Rx and informed patient to destroy paper script if found

## 2016-03-28 NOTE — Telephone Encounter (Signed)
We stated he was not interetesed in seeing someone for mood at this time.  If that has changed, we may refer him to Dr. Sima Matas.  Thanks.

## 2016-04-02 DIAGNOSIS — H9011 Conductive hearing loss, unilateral, right ear, with unrestricted hearing on the contralateral side: Secondary | ICD-10-CM | POA: Insufficient documentation

## 2016-04-02 DIAGNOSIS — H90A11 Conductive hearing loss, unilateral, right ear with restricted hearing on the contralateral side: Secondary | ICD-10-CM | POA: Insufficient documentation

## 2016-04-24 ENCOUNTER — Encounter
Payer: Worker's Compensation | Attending: Physical Medicine & Rehabilitation | Admitting: Physical Medicine & Rehabilitation

## 2016-04-24 ENCOUNTER — Encounter: Payer: Self-pay | Admitting: Physical Medicine & Rehabilitation

## 2016-04-24 VITALS — BP 145/85 | HR 84

## 2016-04-24 DIAGNOSIS — Z85828 Personal history of other malignant neoplasm of skin: Secondary | ICD-10-CM | POA: Insufficient documentation

## 2016-04-24 DIAGNOSIS — G8929 Other chronic pain: Secondary | ICD-10-CM | POA: Diagnosis not present

## 2016-04-24 DIAGNOSIS — G479 Sleep disorder, unspecified: Secondary | ICD-10-CM

## 2016-04-24 DIAGNOSIS — M109 Gout, unspecified: Secondary | ICD-10-CM | POA: Insufficient documentation

## 2016-04-24 DIAGNOSIS — T1490XA Injury, unspecified, initial encounter: Secondary | ICD-10-CM | POA: Diagnosis not present

## 2016-04-24 DIAGNOSIS — Z9079 Acquired absence of other genital organ(s): Secondary | ICD-10-CM | POA: Diagnosis not present

## 2016-04-24 DIAGNOSIS — I10 Essential (primary) hypertension: Secondary | ICD-10-CM | POA: Diagnosis not present

## 2016-04-24 DIAGNOSIS — K219 Gastro-esophageal reflux disease without esophagitis: Secondary | ICD-10-CM | POA: Insufficient documentation

## 2016-04-24 DIAGNOSIS — G4700 Insomnia, unspecified: Secondary | ICD-10-CM | POA: Diagnosis not present

## 2016-04-24 DIAGNOSIS — N529 Male erectile dysfunction, unspecified: Secondary | ICD-10-CM | POA: Insufficient documentation

## 2016-04-24 DIAGNOSIS — Z960 Presence of urogenital implants: Secondary | ICD-10-CM | POA: Insufficient documentation

## 2016-04-24 DIAGNOSIS — E7800 Pure hypercholesterolemia, unspecified: Secondary | ICD-10-CM | POA: Insufficient documentation

## 2016-04-24 DIAGNOSIS — Z8546 Personal history of malignant neoplasm of prostate: Secondary | ICD-10-CM | POA: Diagnosis not present

## 2016-04-24 DIAGNOSIS — R269 Unspecified abnormalities of gait and mobility: Secondary | ICD-10-CM | POA: Diagnosis not present

## 2016-04-24 DIAGNOSIS — Z87442 Personal history of urinary calculi: Secondary | ICD-10-CM | POA: Insufficient documentation

## 2016-04-24 DIAGNOSIS — G894 Chronic pain syndrome: Secondary | ICD-10-CM | POA: Diagnosis not present

## 2016-04-24 DIAGNOSIS — M546 Pain in thoracic spine: Secondary | ICD-10-CM | POA: Diagnosis not present

## 2016-04-24 DIAGNOSIS — M25561 Pain in right knee: Secondary | ICD-10-CM | POA: Insufficient documentation

## 2016-04-24 DIAGNOSIS — M25519 Pain in unspecified shoulder: Secondary | ICD-10-CM | POA: Diagnosis not present

## 2016-04-24 DIAGNOSIS — Z79891 Long term (current) use of opiate analgesic: Secondary | ICD-10-CM | POA: Insufficient documentation

## 2016-04-24 DIAGNOSIS — G8918 Other acute postprocedural pain: Secondary | ICD-10-CM | POA: Diagnosis not present

## 2016-04-24 DIAGNOSIS — F39 Unspecified mood [affective] disorder: Secondary | ICD-10-CM | POA: Diagnosis not present

## 2016-04-24 DIAGNOSIS — F329 Major depressive disorder, single episode, unspecified: Secondary | ICD-10-CM

## 2016-04-24 DIAGNOSIS — M5416 Radiculopathy, lumbar region: Secondary | ICD-10-CM

## 2016-04-24 DIAGNOSIS — Z823 Family history of stroke: Secondary | ICD-10-CM | POA: Insufficient documentation

## 2016-04-24 DIAGNOSIS — Z8249 Family history of ischemic heart disease and other diseases of the circulatory system: Secondary | ICD-10-CM | POA: Insufficient documentation

## 2016-04-24 DIAGNOSIS — S01311S Laceration without foreign body of right ear, sequela: Secondary | ICD-10-CM | POA: Insufficient documentation

## 2016-04-24 DIAGNOSIS — Z87828 Personal history of other (healed) physical injury and trauma: Secondary | ICD-10-CM | POA: Insufficient documentation

## 2016-04-24 DIAGNOSIS — Z9889 Other specified postprocedural states: Secondary | ICD-10-CM | POA: Insufficient documentation

## 2016-04-24 DIAGNOSIS — Z800 Family history of malignant neoplasm of digestive organs: Secondary | ICD-10-CM | POA: Insufficient documentation

## 2016-04-24 MED ORDER — AMITRIPTYLINE HCL 50 MG PO TABS: 50.0000 mg | ORAL_TABLET | Freq: Every day | ORAL | 1 refills | Status: DC

## 2016-04-24 MED ORDER — DULOXETINE HCL 60 MG PO CPEP: 60.0000 mg | ORAL_CAPSULE | Freq: Every day | ORAL | 1 refills | Status: DC
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
## 2016-04-24 NOTE — Addendum Note (Signed)
Addended by: Marland Mcalpine B on: 04/24/2016 03:33 PM   Modules accepted: Orders

## 2016-05-22 ENCOUNTER — Other Ambulatory Visit: Payer: Self-pay | Admitting: Physical Medicine & Rehabilitation

## 2016-05-30 ENCOUNTER — Encounter
Payer: Worker's Compensation | Attending: Physical Medicine & Rehabilitation | Admitting: Physical Medicine & Rehabilitation

## 2016-05-30 ENCOUNTER — Encounter: Payer: Self-pay | Admitting: Physical Medicine & Rehabilitation

## 2016-05-30 VITALS — BP 127/83 | HR 83

## 2016-05-30 DIAGNOSIS — S01311A Laceration without foreign body of right ear, initial encounter: Secondary | ICD-10-CM | POA: Insufficient documentation

## 2016-05-30 DIAGNOSIS — G894 Chronic pain syndrome: Secondary | ICD-10-CM | POA: Diagnosis not present

## 2016-05-30 DIAGNOSIS — I10 Essential (primary) hypertension: Secondary | ICD-10-CM | POA: Diagnosis not present

## 2016-05-30 DIAGNOSIS — Z960 Presence of urogenital implants: Secondary | ICD-10-CM | POA: Insufficient documentation

## 2016-05-30 DIAGNOSIS — Z9079 Acquired absence of other genital organ(s): Secondary | ICD-10-CM | POA: Diagnosis not present

## 2016-05-30 DIAGNOSIS — G479 Sleep disorder, unspecified: Secondary | ICD-10-CM

## 2016-05-30 DIAGNOSIS — R269 Unspecified abnormalities of gait and mobility: Secondary | ICD-10-CM | POA: Diagnosis not present

## 2016-05-30 DIAGNOSIS — G8918 Other acute postprocedural pain: Secondary | ICD-10-CM

## 2016-05-30 DIAGNOSIS — K5900 Constipation, unspecified: Secondary | ICD-10-CM | POA: Diagnosis not present

## 2016-05-30 DIAGNOSIS — R251 Tremor, unspecified: Secondary | ICD-10-CM | POA: Insufficient documentation

## 2016-05-30 DIAGNOSIS — Z8249 Family history of ischemic heart disease and other diseases of the circulatory system: Secondary | ICD-10-CM | POA: Diagnosis not present

## 2016-05-30 DIAGNOSIS — M546 Pain in thoracic spine: Secondary | ICD-10-CM

## 2016-05-30 DIAGNOSIS — X58XXXA Exposure to other specified factors, initial encounter: Secondary | ICD-10-CM | POA: Insufficient documentation

## 2016-05-30 DIAGNOSIS — Z823 Family history of stroke: Secondary | ICD-10-CM | POA: Diagnosis not present

## 2016-05-30 DIAGNOSIS — T1490XA Injury, unspecified, initial encounter: Secondary | ICD-10-CM | POA: Diagnosis not present

## 2016-05-30 DIAGNOSIS — S01311S Laceration without foreign body of right ear, sequela: Secondary | ICD-10-CM | POA: Diagnosis not present

## 2016-05-30 DIAGNOSIS — Z85828 Personal history of other malignant neoplasm of skin: Secondary | ICD-10-CM | POA: Diagnosis not present

## 2016-05-30 DIAGNOSIS — K219 Gastro-esophageal reflux disease without esophagitis: Secondary | ICD-10-CM | POA: Insufficient documentation

## 2016-05-30 DIAGNOSIS — Z8546 Personal history of malignant neoplasm of prostate: Secondary | ICD-10-CM | POA: Diagnosis present

## 2016-05-30 DIAGNOSIS — Z808 Family history of malignant neoplasm of other organs or systems: Secondary | ICD-10-CM | POA: Insufficient documentation

## 2016-05-30 DIAGNOSIS — F329 Major depressive disorder, single episode, unspecified: Secondary | ICD-10-CM

## 2016-05-30 MED ORDER — PROPRANOLOL HCL 10 MG PO TABS: 10.0000 mg | ORAL_TABLET | Freq: Four times a day (QID) | ORAL | 1 refills | Status: DC

## 2016-05-30 MED ORDER — PROPRANOLOL HCL 10 MG PO TABS: 10.0000 mg | ORAL_TABLET | Freq: Three times a day (TID) | ORAL | 1 refills | Status: DC

## 2016-05-30 MED ORDER — TRAMADOL HCL 50 MG PO TABS: 50.0000 mg | ORAL_TABLET | Freq: Two times a day (BID) | ORAL | 1 refills | Status: DC

## 2016-05-30 MED ORDER — AMITRIPTYLINE HCL 75 MG PO TABS: 75.0000 mg | ORAL_TABLET | Freq: Every day | ORAL | 1 refills | Status: DC

## 2016-05-30 MED ORDER — POLYETHYLENE GLYCOL 3350 17 G PO PACK: 17.0000 g | PACK | Freq: Every day | ORAL | 0 refills | Status: DC

## 2016-05-30 NOTE — Patient Instructions (Signed)
Patient not appropriate to return to work at this time. Keep out of work until next appointment and will reevaluate at that time.
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
## 2016-06-05 ENCOUNTER — Ambulatory Visit: Payer: Self-pay | Admitting: Physical Medicine & Rehabilitation
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
## 2016-06-19 ENCOUNTER — Ambulatory Visit: Payer: BLUE CROSS/BLUE SHIELD | Admitting: Physician Assistant

## 2016-06-20 ENCOUNTER — Other Ambulatory Visit: Payer: Self-pay | Admitting: Physical Medicine & Rehabilitation

## 2016-06-30 DIAGNOSIS — I10 Essential (primary) hypertension: Secondary | ICD-10-CM | POA: Diagnosis not present

## 2016-06-30 DIAGNOSIS — S2241XA Multiple fractures of ribs, right side, initial encounter for closed fracture: Secondary | ICD-10-CM

## 2016-06-30 DIAGNOSIS — M6281 Muscle weakness (generalized): Secondary | ICD-10-CM

## 2016-06-30 DIAGNOSIS — S27321A Contusion of lung, unilateral, initial encounter: Secondary | ICD-10-CM

## 2016-06-30 DIAGNOSIS — J9383 Other pneumothorax: Secondary | ICD-10-CM

## 2016-06-30 DIAGNOSIS — M25371 Other instability, right ankle: Secondary | ICD-10-CM

## 2016-06-30 DIAGNOSIS — S22009A Unspecified fracture of unspecified thoracic vertebra, initial encounter for closed fracture: Secondary | ICD-10-CM | POA: Diagnosis not present

## 2016-06-30 DIAGNOSIS — R2681 Unsteadiness on feet: Secondary | ICD-10-CM | POA: Diagnosis not present

## 2016-07-08 ENCOUNTER — Encounter: Payer: Self-pay | Admitting: Physician Assistant

## 2016-07-08 ENCOUNTER — Ambulatory Visit (INDEPENDENT_AMBULATORY_CARE_PROVIDER_SITE_OTHER): Payer: BLUE CROSS/BLUE SHIELD | Admitting: Physician Assistant

## 2016-07-08 VITALS — BP 120/80 | HR 82 | Ht 70.0 in | Wt 259.0 lb

## 2016-07-08 DIAGNOSIS — I10 Essential (primary) hypertension: Secondary | ICD-10-CM | POA: Diagnosis not present

## 2016-07-08 DIAGNOSIS — T1490XA Injury, unspecified, initial encounter: Secondary | ICD-10-CM | POA: Diagnosis not present

## 2016-07-08 DIAGNOSIS — R079 Chest pain, unspecified: Secondary | ICD-10-CM

## 2016-07-08 DIAGNOSIS — E785 Hyperlipidemia, unspecified: Secondary | ICD-10-CM

## 2016-07-08 NOTE — Patient Instructions (Signed)
Medication Instructions:  Your physician recommends that you continue on your current medications as directed. Please refer to the Current Medication list given to you today.  Testing/Procedures: Your physician has requested that you have a lexiscan myoview. For further information please visit [email or web address]. Please follow instruction sheet, as given.  Follow-Up: Pending test results-we will call you with the results and let you know if you need to follow up.  Any Other Special Instructions Will Be Listed Below (If Applicable).     If you need a refill on your cardiac medications before your next appointment, please call your pharmacy.
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
## 2016-07-30 ENCOUNTER — Encounter
Payer: Worker's Compensation | Attending: Physical Medicine & Rehabilitation | Admitting: Physical Medicine & Rehabilitation

## 2016-07-30 ENCOUNTER — Encounter: Payer: Self-pay | Admitting: Physical Medicine & Rehabilitation

## 2016-07-30 VITALS — BP 124/82 | HR 68

## 2016-07-30 DIAGNOSIS — Z823 Family history of stroke: Secondary | ICD-10-CM | POA: Insufficient documentation

## 2016-07-30 DIAGNOSIS — S01311S Laceration without foreign body of right ear, sequela: Secondary | ICD-10-CM

## 2016-07-30 DIAGNOSIS — R251 Tremor, unspecified: Secondary | ICD-10-CM

## 2016-07-30 DIAGNOSIS — K219 Gastro-esophageal reflux disease without esophagitis: Secondary | ICD-10-CM | POA: Diagnosis not present

## 2016-07-30 DIAGNOSIS — Z8546 Personal history of malignant neoplasm of prostate: Secondary | ICD-10-CM | POA: Diagnosis not present

## 2016-07-30 DIAGNOSIS — M546 Pain in thoracic spine: Secondary | ICD-10-CM

## 2016-07-30 DIAGNOSIS — M7062 Trochanteric bursitis, left hip: Secondary | ICD-10-CM

## 2016-07-30 DIAGNOSIS — K5900 Constipation, unspecified: Secondary | ICD-10-CM | POA: Diagnosis not present

## 2016-07-30 DIAGNOSIS — M1711 Unilateral primary osteoarthritis, right knee: Secondary | ICD-10-CM | POA: Diagnosis not present

## 2016-07-30 DIAGNOSIS — S01311D Laceration without foreign body of right ear, subsequent encounter: Secondary | ICD-10-CM | POA: Insufficient documentation

## 2016-07-30 DIAGNOSIS — Z79899 Other long term (current) drug therapy: Secondary | ICD-10-CM | POA: Insufficient documentation

## 2016-07-30 DIAGNOSIS — G4700 Insomnia, unspecified: Secondary | ICD-10-CM | POA: Diagnosis not present

## 2016-07-30 DIAGNOSIS — Z800 Family history of malignant neoplasm of digestive organs: Secondary | ICD-10-CM | POA: Diagnosis not present

## 2016-07-30 DIAGNOSIS — S6291XD Unspecified fracture of right wrist and hand, subsequent encounter for fracture with routine healing: Secondary | ICD-10-CM | POA: Insufficient documentation

## 2016-07-30 DIAGNOSIS — I10 Essential (primary) hypertension: Secondary | ICD-10-CM | POA: Diagnosis not present

## 2016-07-30 DIAGNOSIS — M5416 Radiculopathy, lumbar region: Secondary | ICD-10-CM | POA: Diagnosis not present

## 2016-07-30 DIAGNOSIS — M549 Dorsalgia, unspecified: Secondary | ICD-10-CM | POA: Diagnosis not present

## 2016-07-30 DIAGNOSIS — T1490XA Injury, unspecified, initial encounter: Secondary | ICD-10-CM | POA: Diagnosis not present

## 2016-07-30 DIAGNOSIS — Z8249 Family history of ischemic heart disease and other diseases of the circulatory system: Secondary | ICD-10-CM | POA: Insufficient documentation

## 2016-07-30 DIAGNOSIS — F329 Major depressive disorder, single episode, unspecified: Secondary | ICD-10-CM

## 2016-07-30 DIAGNOSIS — N529 Male erectile dysfunction, unspecified: Secondary | ICD-10-CM | POA: Insufficient documentation

## 2016-07-30 DIAGNOSIS — Z85828 Personal history of other malignant neoplasm of skin: Secondary | ICD-10-CM | POA: Insufficient documentation

## 2016-07-30 DIAGNOSIS — Z87442 Personal history of urinary calculi: Secondary | ICD-10-CM | POA: Diagnosis not present

## 2016-07-30 DIAGNOSIS — R269 Unspecified abnormalities of gait and mobility: Secondary | ICD-10-CM | POA: Diagnosis not present

## 2016-07-30 DIAGNOSIS — M7061 Trochanteric bursitis, right hip: Secondary | ICD-10-CM | POA: Diagnosis not present

## 2016-07-30 DIAGNOSIS — G8918 Other acute postprocedural pain: Secondary | ICD-10-CM | POA: Diagnosis not present

## 2016-07-30 DIAGNOSIS — G894 Chronic pain syndrome: Secondary | ICD-10-CM

## 2016-07-30 DIAGNOSIS — G479 Sleep disorder, unspecified: Secondary | ICD-10-CM | POA: Diagnosis not present

## 2016-07-30 MED ORDER — GABAPENTIN 300 MG PO CAPS: 300.0000 mg | ORAL_CAPSULE | Freq: Three times a day (TID) | ORAL | 1 refills | Status: DC

## 2016-07-30 MED ORDER — TRAMADOL HCL 50 MG PO TABS: 50.0000 mg | ORAL_TABLET | Freq: Four times a day (QID) | ORAL | 0 refills | Status: DC | PRN
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
## 2016-08-04 ENCOUNTER — Telehealth (HOSPITAL_COMMUNITY): Payer: Self-pay | Admitting: *Deleted

## 2016-08-04 NOTE — Telephone Encounter (Signed)
Left message on voicemail per DPR in reference to upcoming appointment scheduled on 08/06/16 at 1000 with detailed instructions given per Myocardial Perfusion Study Information Sheet for the test. LM to arrive 15 minutes early, and that it is imperative to arrive on time for appointment to keep from having the test rescheduled. If you need to cancel or reschedule your appointment, please call the office within 24 hours of your appointment. Failure to do so may result in a cancellation of your appointment, and a $50 no show fee. Phone number given for call back for any questions.

## 2016-08-06 ENCOUNTER — Ambulatory Visit (HOSPITAL_COMMUNITY): Payer: BLUE CROSS/BLUE SHIELD | Attending: Cardiology

## 2016-08-06 ENCOUNTER — Other Ambulatory Visit: Payer: Self-pay | Admitting: Physical Medicine & Rehabilitation

## 2016-08-06 DIAGNOSIS — R0609 Other forms of dyspnea: Secondary | ICD-10-CM | POA: Insufficient documentation

## 2016-08-06 DIAGNOSIS — Z01818 Encounter for other preprocedural examination: Secondary | ICD-10-CM | POA: Insufficient documentation

## 2016-08-06 DIAGNOSIS — R079 Chest pain, unspecified: Secondary | ICD-10-CM

## 2016-08-06 DIAGNOSIS — Z8249 Family history of ischemic heart disease and other diseases of the circulatory system: Secondary | ICD-10-CM | POA: Insufficient documentation

## 2016-08-06 DIAGNOSIS — I10 Essential (primary) hypertension: Secondary | ICD-10-CM | POA: Diagnosis not present

## 2016-08-06 LAB — MYOCARDIAL PERFUSION IMAGING
CHL CUP RESTING HR STRESS: 57 {beats}/min
CSEPPHR: 77 {beats}/min

## 2016-08-06 MED ORDER — TECHNETIUM TC 99M TETROFOSMIN IV KIT
32.6000 | PACK | Freq: Once | INTRAVENOUS | Status: AC | PRN
  Administered 2016-08-06: 32.6 via INTRAVENOUS
  Filled 2016-08-06: qty 33

## 2016-08-06 MED ORDER — REGADENOSON 0.4 MG/5ML IV SOLN
0.4000 mg | Freq: Once | INTRAVENOUS | Status: AC
Start: 2016-08-06 — End: 2016-08-06
  Administered 2016-08-06: 0.4 mg via INTRAVENOUS

## 2016-08-06 MED ORDER — TECHNETIUM TC 99M TETROFOSMIN IV KIT
10.1000 | PACK | Freq: Once | INTRAVENOUS | Status: AC | PRN
Start: 2016-08-06 — End: 2016-08-06
  Administered 2016-08-06: 10.1 via INTRAVENOUS
  Filled 2016-08-06: qty 11

## 2016-08-08 ENCOUNTER — Telehealth: Payer: Self-pay | Admitting: Physician Assistant

## 2016-08-08 NOTE — Telephone Encounter (Signed)
Pt was returning my call from a couple days ago, but we actually spoke this morning re: stress test results. Pt didn't realize it was for the same thing, but thanked me for my call.

## 2016-08-08 NOTE — Telephone Encounter (Signed)
New message     Pt is returning your call for his myocardial perfusion results

## 2016-08-14 ENCOUNTER — Telehealth: Payer: Self-pay | Admitting: *Deleted

## 2016-08-14 NOTE — Telephone Encounter (Signed)
Winter from [facility] called and they are requesting a return to work note from the 07/30/16 visit.  Their fax number is [phone] ph [phone].Please write one and I can print and fax.

## 2016-08-16 ENCOUNTER — Other Ambulatory Visit: Payer: Self-pay | Admitting: Physical Medicine & Rehabilitation

## 2016-08-20 ENCOUNTER — Other Ambulatory Visit: Payer: Self-pay | Admitting: Physical Medicine & Rehabilitation

## 2016-08-20 NOTE — Telephone Encounter (Signed)
Recieved electronic medication refill request for methocarbamol, last note stated:      Robaxin changed to 500 4/day PRN d/ced by Ortho  Please advise

## 2016-08-25 ENCOUNTER — Other Ambulatory Visit: Payer: Self-pay

## 2016-08-27 ENCOUNTER — Other Ambulatory Visit: Payer: Self-pay | Admitting: Physical Medicine & Rehabilitation

## 2016-09-05 ENCOUNTER — Encounter: Payer: Worker's Compensation | Admitting: Physical Medicine & Rehabilitation

## 2016-09-25 ENCOUNTER — Encounter: Payer: Self-pay | Admitting: Physical Medicine & Rehabilitation

## 2016-09-25 ENCOUNTER — Encounter
Payer: Worker's Compensation | Attending: Physical Medicine & Rehabilitation | Admitting: Physical Medicine & Rehabilitation

## 2016-09-25 VITALS — BP 139/90 | HR 65 | Resp 14

## 2016-09-25 DIAGNOSIS — G8918 Other acute postprocedural pain: Secondary | ICD-10-CM | POA: Diagnosis not present

## 2016-09-25 DIAGNOSIS — K5900 Constipation, unspecified: Secondary | ICD-10-CM | POA: Diagnosis not present

## 2016-09-25 DIAGNOSIS — F329 Major depressive disorder, single episode, unspecified: Secondary | ICD-10-CM | POA: Diagnosis not present

## 2016-09-25 DIAGNOSIS — R251 Tremor, unspecified: Secondary | ICD-10-CM | POA: Diagnosis not present

## 2016-09-25 DIAGNOSIS — Z87442 Personal history of urinary calculi: Secondary | ICD-10-CM | POA: Diagnosis not present

## 2016-09-25 DIAGNOSIS — M1711 Unilateral primary osteoarthritis, right knee: Secondary | ICD-10-CM | POA: Insufficient documentation

## 2016-09-25 DIAGNOSIS — G479 Sleep disorder, unspecified: Secondary | ICD-10-CM | POA: Diagnosis not present

## 2016-09-25 DIAGNOSIS — Z79899 Other long term (current) drug therapy: Secondary | ICD-10-CM | POA: Insufficient documentation

## 2016-09-25 DIAGNOSIS — K219 Gastro-esophageal reflux disease without esophagitis: Secondary | ICD-10-CM | POA: Insufficient documentation

## 2016-09-25 DIAGNOSIS — Z8546 Personal history of malignant neoplasm of prostate: Secondary | ICD-10-CM | POA: Diagnosis not present

## 2016-09-25 DIAGNOSIS — I10 Essential (primary) hypertension: Secondary | ICD-10-CM | POA: Insufficient documentation

## 2016-09-25 DIAGNOSIS — S01311S Laceration without foreign body of right ear, sequela: Secondary | ICD-10-CM | POA: Diagnosis not present

## 2016-09-25 DIAGNOSIS — S01311D Laceration without foreign body of right ear, subsequent encounter: Secondary | ICD-10-CM | POA: Diagnosis not present

## 2016-09-25 DIAGNOSIS — G4700 Insomnia, unspecified: Secondary | ICD-10-CM | POA: Diagnosis not present

## 2016-09-25 DIAGNOSIS — Z800 Family history of malignant neoplasm of digestive organs: Secondary | ICD-10-CM | POA: Diagnosis not present

## 2016-09-25 DIAGNOSIS — M546 Pain in thoracic spine: Secondary | ICD-10-CM | POA: Diagnosis not present

## 2016-09-25 DIAGNOSIS — Z8249 Family history of ischemic heart disease and other diseases of the circulatory system: Secondary | ICD-10-CM | POA: Diagnosis not present

## 2016-09-25 DIAGNOSIS — R269 Unspecified abnormalities of gait and mobility: Secondary | ICD-10-CM | POA: Diagnosis not present

## 2016-09-25 DIAGNOSIS — Z85828 Personal history of other malignant neoplasm of skin: Secondary | ICD-10-CM | POA: Insufficient documentation

## 2016-09-25 DIAGNOSIS — G894 Chronic pain syndrome: Secondary | ICD-10-CM | POA: Diagnosis not present

## 2016-09-25 DIAGNOSIS — M549 Dorsalgia, unspecified: Secondary | ICD-10-CM | POA: Diagnosis not present

## 2016-09-25 DIAGNOSIS — N529 Male erectile dysfunction, unspecified: Secondary | ICD-10-CM | POA: Diagnosis not present

## 2016-09-25 DIAGNOSIS — S6291XD Unspecified fracture of right wrist and hand, subsequent encounter for fracture with routine healing: Secondary | ICD-10-CM | POA: Diagnosis present

## 2016-09-25 DIAGNOSIS — Z823 Family history of stroke: Secondary | ICD-10-CM | POA: Diagnosis not present

## 2016-09-25 DIAGNOSIS — T1490XA Injury, unspecified, initial encounter: Secondary | ICD-10-CM

## 2016-09-25 MED ORDER — POLYETHYLENE GLYCOL 3350 17 G PO PACK: 17.0000 g | PACK | Freq: Every day | ORAL | 0 refills | Status: DC

## 2016-09-25 MED ORDER — TRAMADOL HCL 50 MG PO TABS: 50.0000 mg | ORAL_TABLET | Freq: Two times a day (BID) | ORAL | 1 refills | Status: DC | PRN

## 2016-09-25 MED ORDER — METHOCARBAMOL 500 MG PO TABS: 500.0000 mg | ORAL_TABLET | Freq: Four times a day (QID) | ORAL | 1 refills | Status: DC | PRN
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
## 2016-09-25 NOTE — Patient Instructions (Signed)
Pt not to return to work until follow up appointment

## 2016-09-26 ENCOUNTER — Telehealth: Payer: Self-pay | Admitting: *Deleted

## 2016-09-26 MED ORDER — POLYETHYLENE GLYCOL 3350 17 G PO PACK: 17.0000 g | PACK | Freq: Every day | ORAL | 0 refills | Status: DC

## 2016-09-26 MED ORDER — METHOCARBAMOL 500 MG PO TABS: 500.0000 mg | ORAL_TABLET | Freq: Four times a day (QID) | ORAL | 1 refills | Status: DC | PRN

## 2016-09-26 NOTE — Telephone Encounter (Signed)
Meds were called to wrong pharmacy.  Please send to [facility] [place] the two meds ordered yesterday.  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  Mr Nettle notified.

## 2016-10-01 ENCOUNTER — Other Ambulatory Visit: Payer: Self-pay | Admitting: Physical Medicine & Rehabilitation

## 2016-10-02 ENCOUNTER — Other Ambulatory Visit: Payer: Self-pay | Admitting: *Deleted

## 2016-10-03 ENCOUNTER — Telehealth: Payer: Self-pay | Admitting: Physical Medicine & Rehabilitation

## 2016-10-03 NOTE — Telephone Encounter (Signed)
CONTACTED [employer] FOR [employer] [facility] TO REQ PRIOR AUTH FOR JR - SHE WILL NEED TO CONFIRM APPROVAL WITH ADJUSTOR FOR REFERRAL

## 2016-10-09 ENCOUNTER — Other Ambulatory Visit: Payer: Self-pay | Admitting: Physical Medicine & Rehabilitation

## 2016-11-12 ENCOUNTER — Ambulatory Visit: Payer: Self-pay | Admitting: Physical Medicine & Rehabilitation

## 2016-11-13 ENCOUNTER — Telehealth: Payer: Self-pay | Admitting: *Deleted

## 2016-11-13 NOTE — Telephone Encounter (Signed)
Gill from [employer] was confirming Dr Serita Grit office address to mail the reports on Mr Alessio.  Address confirmed.

## 2016-11-21 DIAGNOSIS — H6121 Impacted cerumen, right ear: Secondary | ICD-10-CM | POA: Insufficient documentation

## 2016-11-23 IMAGING — MR MR HEAD W/O CM
7 of 10 series · 24 of 48 positions shown · non-contrast
Comparison: None available.

CLINICAL DATA: Dizziness for 4 days. Recent fall with broken nose.
Hit head against table. Syncopal episode.

EXAM:
MRI HEAD WITHOUT CONTRAST
TECHNIQUE: Multiplanar, multiecho pulse sequences of the brain and surrounding
structures were obtained without intravenous contrast.

[Series 5: T1 · sagittal · 5.0mm · 0.45mm/px · 1 of 21 slices shown (1 of 2)]
[im 1/21]
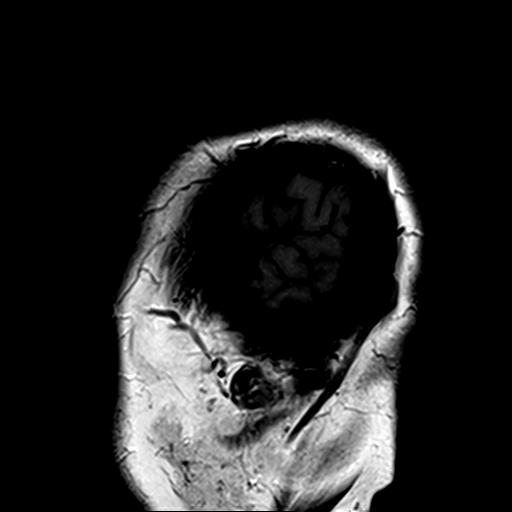

[Series 6: T2 · axial · 5.0mm · 0.50mm/px · z∈[-7,+134]mm · 3 of 23 slices shown (1 of 2)]
[im 1/23]
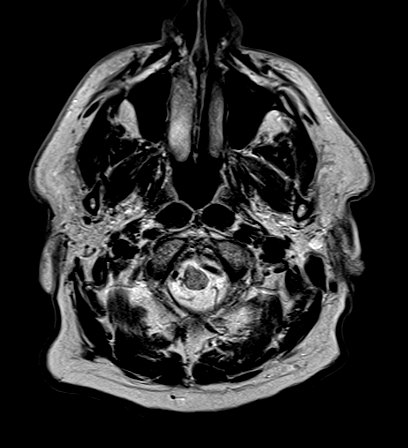
[im 12/23]
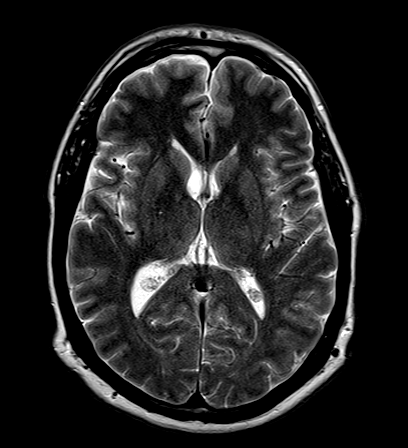
[im 23/23]
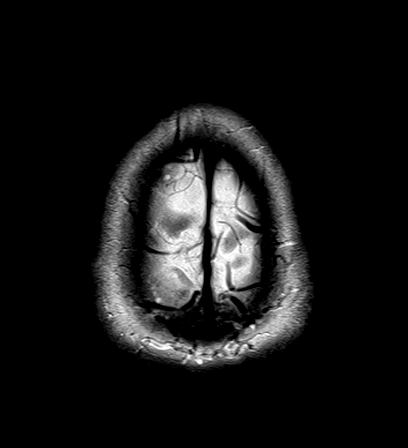

[Series 7: FLAIR · axial · 5.0mm · 0.35mm/px · z∈[-6,+134]mm · 3 of 23 slices shown]
[im 1/23]
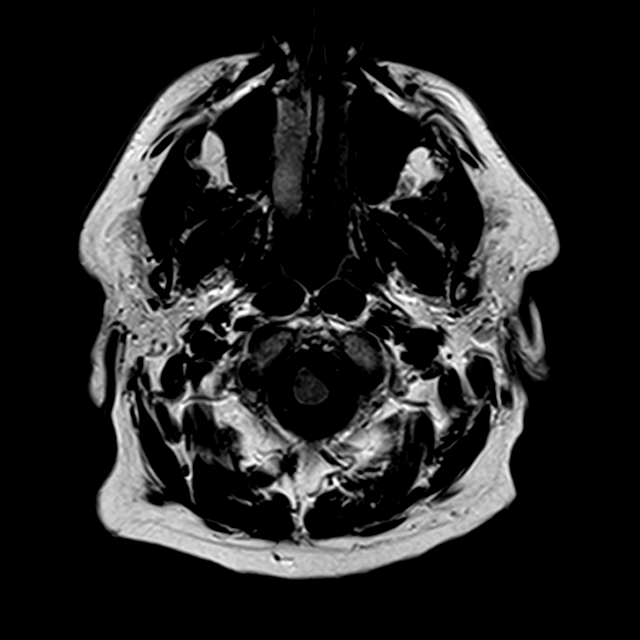
[im 12/23]
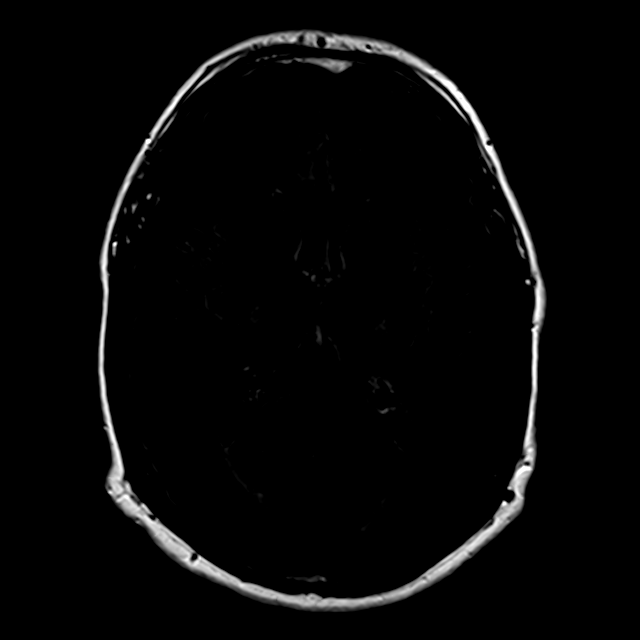
[im 23/23]
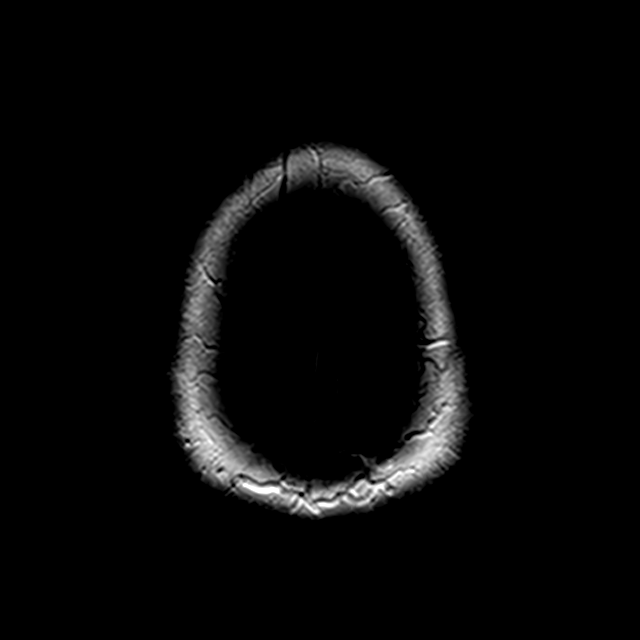

[Series 8: T1 · axial · 2.0mm · 0.43mm/px · z∈[-17,+142]mm · 8 of 82 slices shown (2 of 2)]
[im 1/82]
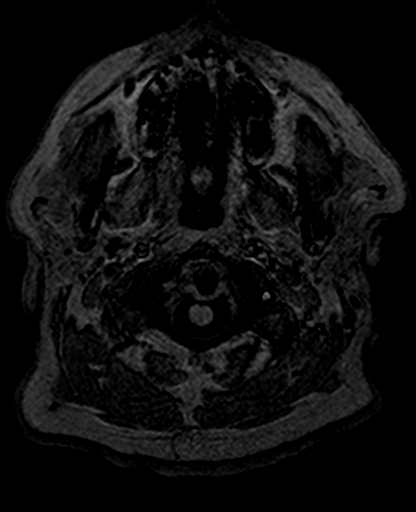
[im 11/82]
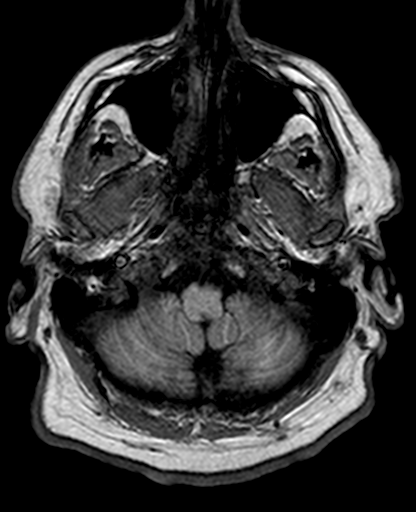
[im 21/82]
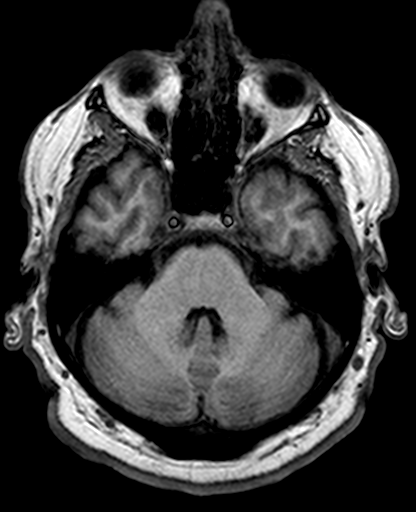
[im 31/82]
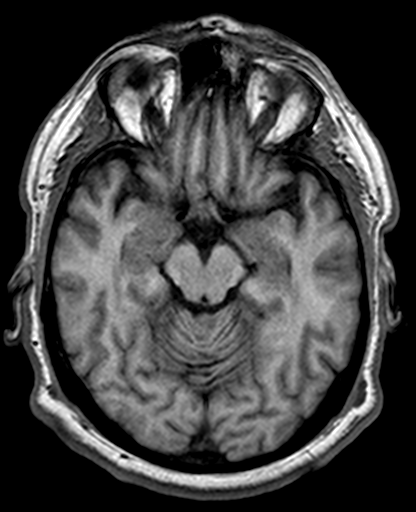
[im 51/82]
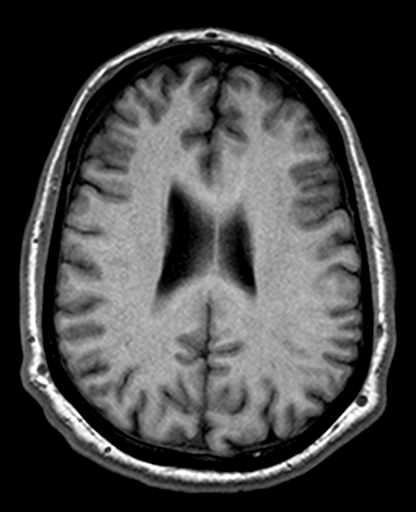
[im 61/82]
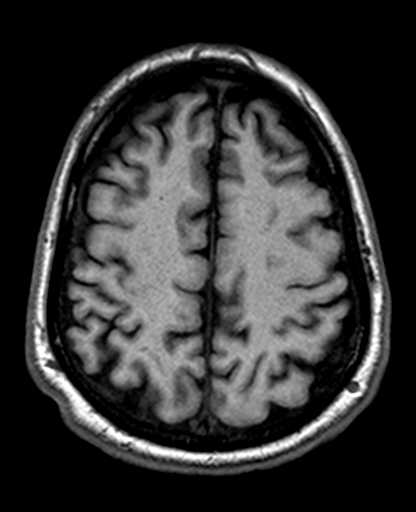
[im 71/82]
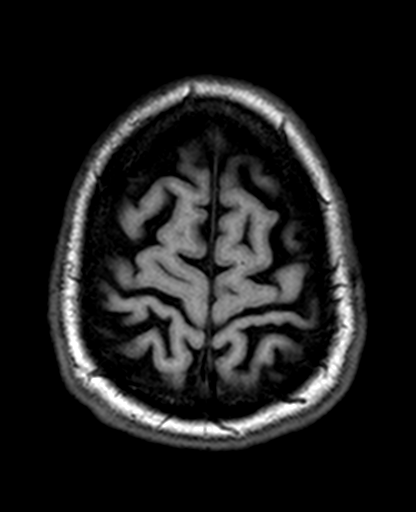
[im 82/82]
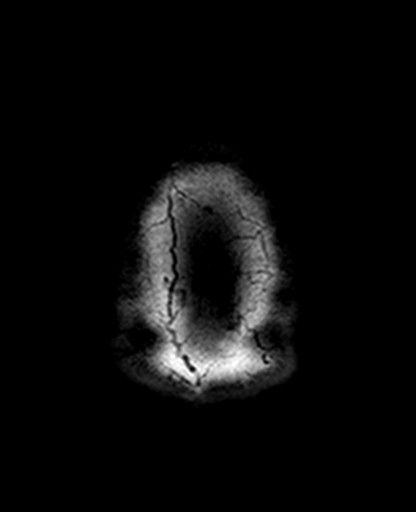

[Series 9: trauma axial · axial · 5.0mm · 0.45mm/px · z∈[-5,+135]mm · 3 of 23 slices shown]
[im 1/23]
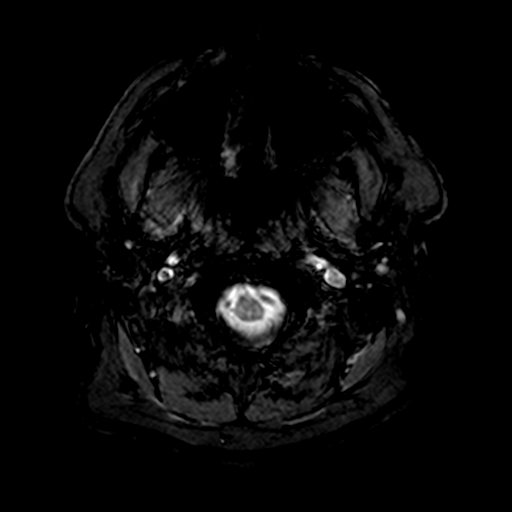
[im 12/23]
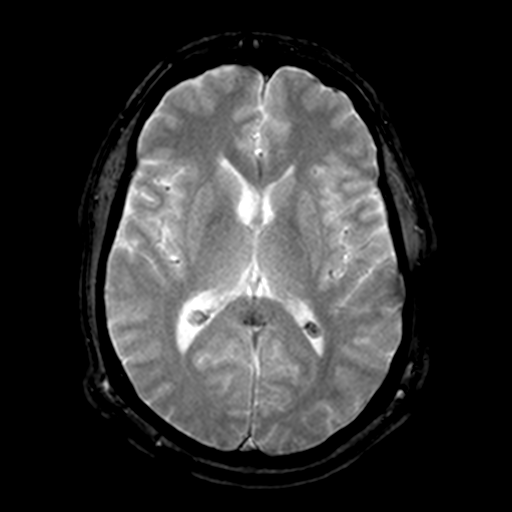
[im 23/23]
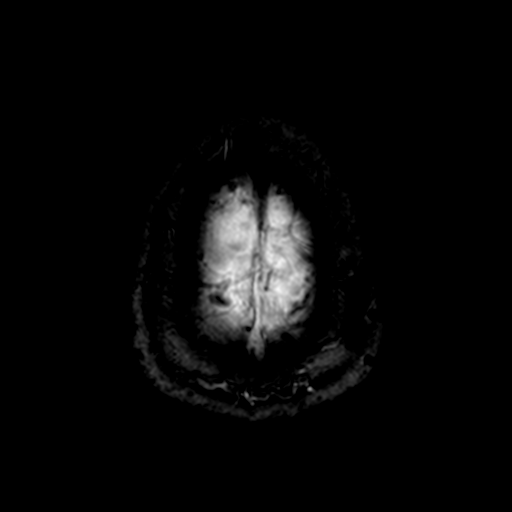

[Series 10: T2 · coronal · 5.0mm · 0.46mm/px · 3 of 30 slices shown (2 of 2)]
[im 1/30]
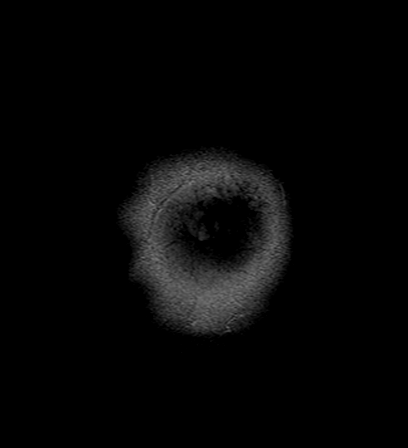
[im 15/30]
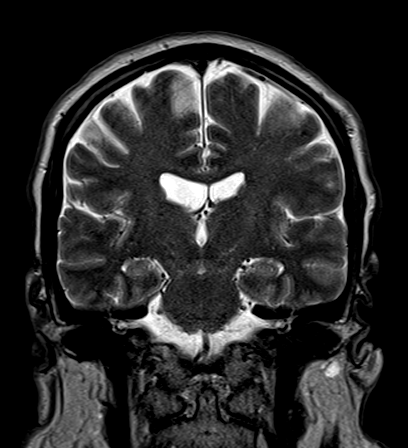
[im 30/30]
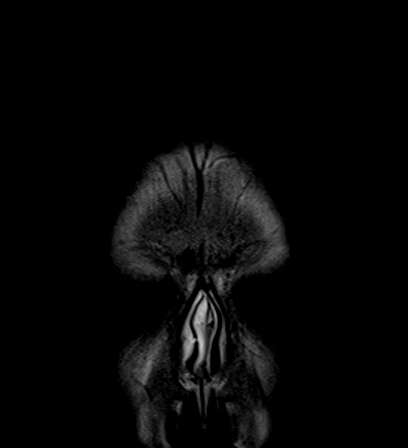

[Series 100: <mpr thick range> · axial · 3.0mm · 0.72mm/px · z∈[-16,+47]mm · 3 of 54 slices shown]
[im 1/54]
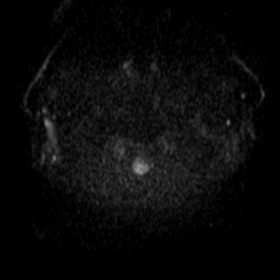
[im 11/54]
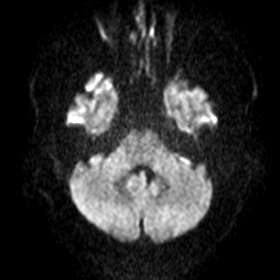
[im 22/54]
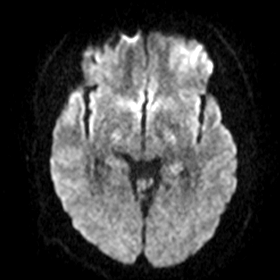

[24 of 48 positions shown; findings below may reference images not displayed]

FINDINGS: The diffusion-weighted images demonstrate no evidence for acute or
subacute infarction. No acute hemorrhage or mass lesion is present.
No significant white matter disease is present. The ventricles are
of normal size.

The internal auditory canals are within normal limits bilaterally.
Flow is present in the major intracranial arteries. The globes and
orbits are intact. The paranasal sinuses and mastoid air cells are
clear.

Skullbase is within normal limits. Midline structures are
unremarkable.
IMPRESSION: 1. Negative MRI of the brain.

## 2016-11-26 ENCOUNTER — Encounter
Payer: Worker's Compensation | Attending: Physical Medicine & Rehabilitation | Admitting: Physical Medicine & Rehabilitation

## 2016-11-26 ENCOUNTER — Encounter: Payer: Self-pay | Admitting: Physical Medicine & Rehabilitation

## 2016-11-26 ENCOUNTER — Ambulatory Visit: Payer: Self-pay | Admitting: Physical Medicine & Rehabilitation

## 2016-11-26 VITALS — BP 143/89 | HR 110

## 2016-11-26 DIAGNOSIS — K219 Gastro-esophageal reflux disease without esophagitis: Secondary | ICD-10-CM | POA: Insufficient documentation

## 2016-11-26 DIAGNOSIS — M546 Pain in thoracic spine: Secondary | ICD-10-CM

## 2016-11-26 DIAGNOSIS — E7800 Pure hypercholesterolemia, unspecified: Secondary | ICD-10-CM | POA: Insufficient documentation

## 2016-11-26 DIAGNOSIS — I10 Essential (primary) hypertension: Secondary | ICD-10-CM | POA: Diagnosis not present

## 2016-11-26 DIAGNOSIS — Z85828 Personal history of other malignant neoplasm of skin: Secondary | ICD-10-CM | POA: Insufficient documentation

## 2016-11-26 DIAGNOSIS — M5416 Radiculopathy, lumbar region: Secondary | ICD-10-CM

## 2016-11-26 DIAGNOSIS — T1490XA Injury, unspecified, initial encounter: Secondary | ICD-10-CM | POA: Diagnosis not present

## 2016-11-26 DIAGNOSIS — S6291XA Unspecified fracture of right wrist and hand, initial encounter for closed fracture: Secondary | ICD-10-CM | POA: Diagnosis present

## 2016-11-26 DIAGNOSIS — M109 Gout, unspecified: Secondary | ICD-10-CM | POA: Diagnosis not present

## 2016-11-26 DIAGNOSIS — R269 Unspecified abnormalities of gait and mobility: Secondary | ICD-10-CM

## 2016-11-26 DIAGNOSIS — F329 Major depressive disorder, single episode, unspecified: Secondary | ICD-10-CM | POA: Diagnosis not present

## 2016-11-26 DIAGNOSIS — G479 Sleep disorder, unspecified: Secondary | ICD-10-CM | POA: Diagnosis not present

## 2016-11-26 DIAGNOSIS — G894 Chronic pain syndrome: Secondary | ICD-10-CM

## 2016-11-26 DIAGNOSIS — Z9079 Acquired absence of other genital organ(s): Secondary | ICD-10-CM | POA: Insufficient documentation

## 2016-11-26 DIAGNOSIS — K5900 Constipation, unspecified: Secondary | ICD-10-CM | POA: Insufficient documentation

## 2016-11-26 DIAGNOSIS — G8918 Other acute postprocedural pain: Secondary | ICD-10-CM | POA: Diagnosis not present

## 2016-11-26 DIAGNOSIS — R251 Tremor, unspecified: Secondary | ICD-10-CM

## 2016-11-26 DIAGNOSIS — Z8546 Personal history of malignant neoplasm of prostate: Secondary | ICD-10-CM | POA: Diagnosis not present

## 2016-11-26 DIAGNOSIS — G4700 Insomnia, unspecified: Secondary | ICD-10-CM | POA: Insufficient documentation

## 2016-11-26 MED ORDER — LIDOCAINE 5 % EX PTCH: 2.0000 | MEDICATED_PATCH | CUTANEOUS | 2 refills | Status: DC

## 2016-11-26 MED ORDER — TRAMADOL HCL 50 MG PO TABS: 50.0000 mg | ORAL_TABLET | Freq: Two times a day (BID) | ORAL | 2 refills | Status: DC | PRN
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
## 2016-12-02 ENCOUNTER — Telehealth: Payer: Self-pay

## 2016-12-02 NOTE — Telephone Encounter (Signed)
Guill with [employer] is requesting a phone consultation with Dr. Posey Pronto. He said this is a Peer to peer medication review. He stated that he mailed the documentation 11/25/16.

## 2016-12-02 NOTE — Telephone Encounter (Signed)
Please have Lattie Haw set this up.  Thanks.

## 2016-12-03 ENCOUNTER — Other Ambulatory Visit: Payer: Self-pay | Admitting: Physical Medicine & Rehabilitation

## 2016-12-04 ENCOUNTER — Ambulatory Visit: Payer: Self-pay | Admitting: Physical Medicine & Rehabilitation

## 2016-12-10 ENCOUNTER — Telehealth: Payer: Self-pay | Admitting: Physical Medicine & Rehabilitation

## 2016-12-10 NOTE — Telephone Encounter (Signed)
Letter and notes faxed to CM.

## 2016-12-10 NOTE — Telephone Encounter (Signed)
Pt's caseworker faxed a written request for a written work status letter. Please advise.

## 2016-12-10 NOTE — Telephone Encounter (Signed)
We may provide a note that says patient may not return to work until follow-up appointment. Thanks.

## 2017-02-11 DIAGNOSIS — M5136 Other intervertebral disc degeneration, lumbar region: Secondary | ICD-10-CM | POA: Insufficient documentation

## 2017-02-11 DIAGNOSIS — S32020A Wedge compression fracture of second lumbar vertebra, initial encounter for closed fracture: Secondary | ICD-10-CM | POA: Insufficient documentation

## 2017-02-11 DIAGNOSIS — M51369 Other intervertebral disc degeneration, lumbar region without mention of lumbar back pain or lower extremity pain: Secondary | ICD-10-CM | POA: Insufficient documentation

## 2017-02-24 ENCOUNTER — Other Ambulatory Visit: Payer: Self-pay

## 2017-02-24 MED ORDER — DULOXETINE HCL 60 MG PO CPEP: ORAL_CAPSULE | ORAL | 1 refills | Status: DC

## 2017-02-26 ENCOUNTER — Encounter
Payer: Worker's Compensation | Attending: Physical Medicine & Rehabilitation | Admitting: Physical Medicine & Rehabilitation

## 2017-02-26 ENCOUNTER — Encounter: Payer: Self-pay | Admitting: Physical Medicine & Rehabilitation

## 2017-02-26 VITALS — BP 114/74 | HR 83

## 2017-02-26 DIAGNOSIS — S6291XD Unspecified fracture of right wrist and hand, subsequent encounter for fracture with routine healing: Secondary | ICD-10-CM | POA: Insufficient documentation

## 2017-02-26 DIAGNOSIS — S01311D Laceration without foreign body of right ear, subsequent encounter: Secondary | ICD-10-CM | POA: Diagnosis not present

## 2017-02-26 DIAGNOSIS — Z79899 Other long term (current) drug therapy: Secondary | ICD-10-CM | POA: Diagnosis not present

## 2017-02-26 DIAGNOSIS — K219 Gastro-esophageal reflux disease without esophagitis: Secondary | ICD-10-CM | POA: Insufficient documentation

## 2017-02-26 DIAGNOSIS — Z9079 Acquired absence of other genital organ(s): Secondary | ICD-10-CM | POA: Insufficient documentation

## 2017-02-26 DIAGNOSIS — F329 Major depressive disorder, single episode, unspecified: Secondary | ICD-10-CM | POA: Diagnosis not present

## 2017-02-26 DIAGNOSIS — M5416 Radiculopathy, lumbar region: Secondary | ICD-10-CM

## 2017-02-26 DIAGNOSIS — G4700 Insomnia, unspecified: Secondary | ICD-10-CM | POA: Diagnosis not present

## 2017-02-26 DIAGNOSIS — K5900 Constipation, unspecified: Secondary | ICD-10-CM | POA: Diagnosis not present

## 2017-02-26 DIAGNOSIS — M546 Pain in thoracic spine: Secondary | ICD-10-CM | POA: Diagnosis not present

## 2017-02-26 DIAGNOSIS — T1490XA Injury, unspecified, initial encounter: Secondary | ICD-10-CM

## 2017-02-26 DIAGNOSIS — G479 Sleep disorder, unspecified: Secondary | ICD-10-CM

## 2017-02-26 DIAGNOSIS — Z85828 Personal history of other malignant neoplasm of skin: Secondary | ICD-10-CM | POA: Insufficient documentation

## 2017-02-26 DIAGNOSIS — R269 Unspecified abnormalities of gait and mobility: Secondary | ICD-10-CM | POA: Diagnosis not present

## 2017-02-26 DIAGNOSIS — R251 Tremor, unspecified: Secondary | ICD-10-CM

## 2017-02-26 DIAGNOSIS — I10 Essential (primary) hypertension: Secondary | ICD-10-CM | POA: Insufficient documentation

## 2017-02-26 DIAGNOSIS — Z8546 Personal history of malignant neoplasm of prostate: Secondary | ICD-10-CM | POA: Insufficient documentation

## 2017-02-26 DIAGNOSIS — G8918 Other acute postprocedural pain: Secondary | ICD-10-CM

## 2017-02-26 DIAGNOSIS — X58XXXD Exposure to other specified factors, subsequent encounter: Secondary | ICD-10-CM | POA: Diagnosis not present

## 2017-02-26 DIAGNOSIS — E7800 Pure hypercholesterolemia, unspecified: Secondary | ICD-10-CM | POA: Diagnosis not present

## 2017-02-26 DIAGNOSIS — M109 Gout, unspecified: Secondary | ICD-10-CM | POA: Diagnosis not present

## 2017-02-26 DIAGNOSIS — G894 Chronic pain syndrome: Secondary | ICD-10-CM | POA: Diagnosis not present

## 2017-02-26 DIAGNOSIS — Z5181 Encounter for therapeutic drug level monitoring: Secondary | ICD-10-CM

## 2017-02-26 MED ORDER — AMITRIPTYLINE HCL 10 MG PO TABS: 10.0000 mg | ORAL_TABLET | Freq: Every day | ORAL | 1 refills | Status: DC

## 2017-02-26 MED ORDER — TRAMADOL HCL 50 MG PO TABS: 50.0000 mg | ORAL_TABLET | Freq: Two times a day (BID) | ORAL | 2 refills | Status: DC | PRN

## 2017-02-26 NOTE — Addendum Note (Signed)
Addended by: Delice Lesch A on: 02/26/2017 11:23 AM   Modules accepted: Orders
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
## 2017-03-03 ENCOUNTER — Telehealth: Payer: Self-pay

## 2017-03-03 NOTE — Telephone Encounter (Signed)
Rx request for Lidocaine 5% patch  Last OV 02/26/2017 Last refilled 11/26/2016  D/C 02/26/17 Please advise

## 2017-03-04 MED ORDER — LIDOCAINE 5 % EX PTCH: 2.0000 | MEDICATED_PATCH | CUTANEOUS | 0 refills | Status: DC

## 2017-03-04 NOTE — Telephone Encounter (Signed)
done

## 2017-03-04 NOTE — Telephone Encounter (Signed)
We can refill it.  Thanks. 

## 2017-03-04 NOTE — Addendum Note (Signed)
Addended by: Caro Hight on: 03/04/2017 05:11 PM   Modules accepted: Orders

## 2017-03-05 LAB — TOXASSURE SELECT,+ANTIDEPR,UR

## 2017-03-06 ENCOUNTER — Telehealth: Payer: Self-pay | Admitting: Physical Medicine & Rehabilitation

## 2017-03-06 ENCOUNTER — Telehealth: Payer: Self-pay | Admitting: *Deleted

## 2017-03-06 NOTE — Telephone Encounter (Signed)
Spoke with patient does not have dx of either conditions. Informed patient that script was faxed to his case manager for prior approval

## 2017-03-06 NOTE — Telephone Encounter (Signed)
I contacted patient asking for a call back in regards to medical history.  A prior authorization is needed for lidocaine 5% patches.  I asked the patient if he has a history of shingles or nerve pain as a result of diabetes.  I am awaiting info before attempting PA.

## 2017-03-06 NOTE — Telephone Encounter (Signed)
Returning [employer] phone call.

## 2017-03-09 ENCOUNTER — Telehealth: Payer: Self-pay | Admitting: *Deleted

## 2017-03-09 NOTE — Telephone Encounter (Signed)
Urine drug screen for this encounter is consistent for prescribed medication 

## 2017-03-10 IMAGING — CT CT HEAD W/O CM
1 of 2 series · 15 of 30 positions shown, 19 images · non-contrast
Comparison: MRI of the brain performed 12/20/2014

CLINICAL DATA: Acute onset of worsening headache and left posterior
neck pain. Dizziness and blurred vision. Initial encounter.

EXAM:
CT HEAD WITHOUT CONTRAST
TECHNIQUE: Contiguous axial images were obtained from the base of the skull
through the vertex without intravenous contrast.

[Series 3: headtrauma 2.4 h60s · axial · 0.49mm/px · z∈[+69,+226]mm · 15 of 72 slices shown, 19 images]
[im 4/72  brain]
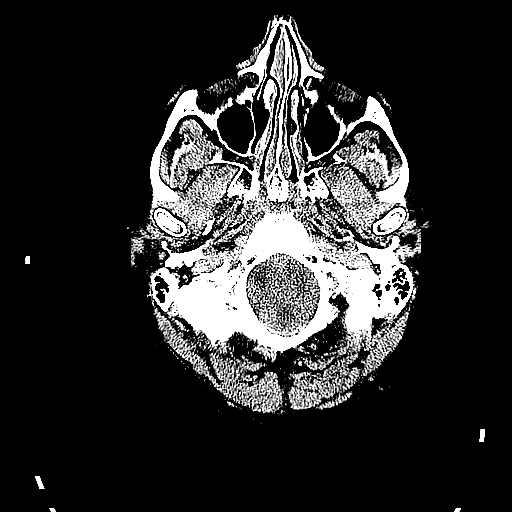
[im 4/72  bone]
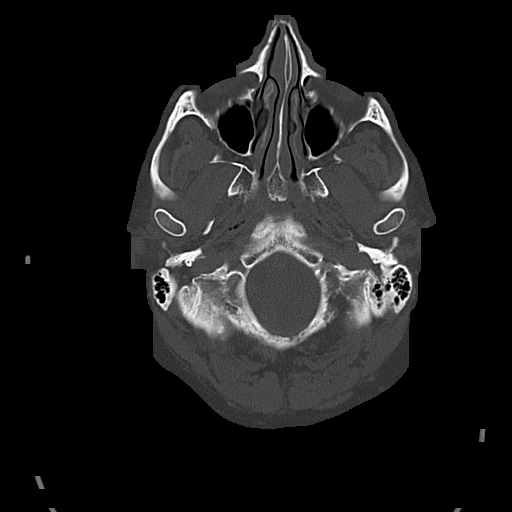
[im 8/72  brain]
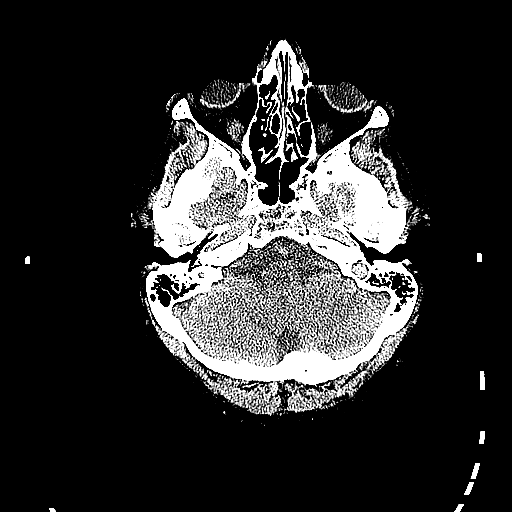
[im 15/72  brain]
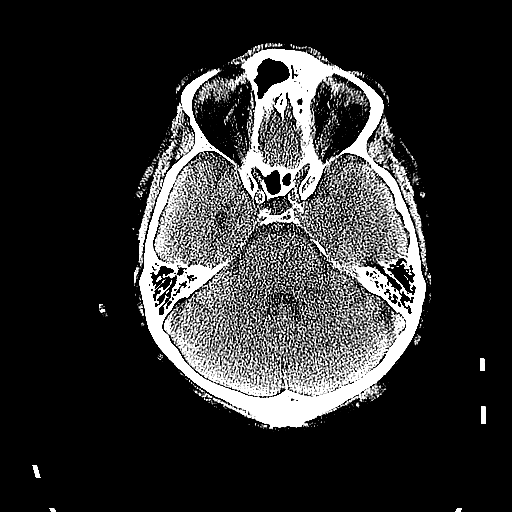
[im 19/72  brain]
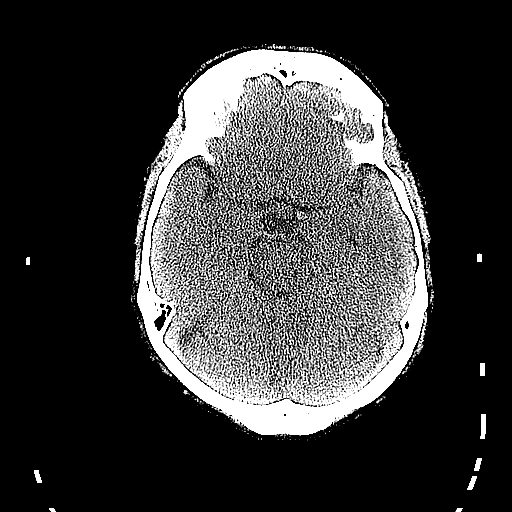
[im 23/72  brain]
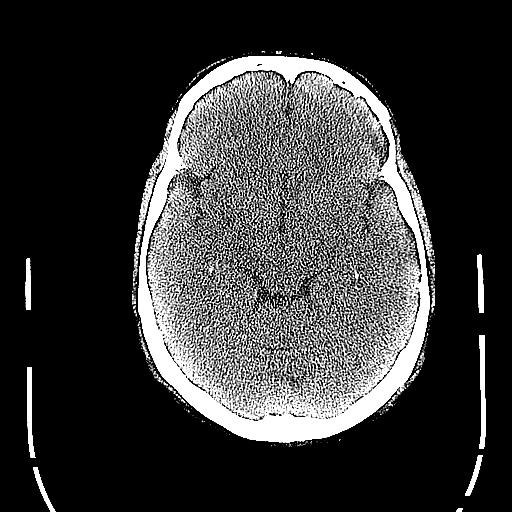
[im 23/72  bone]
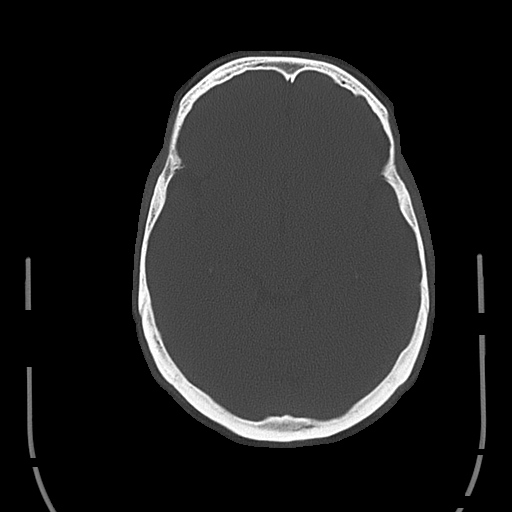
[im 27/72  brain]
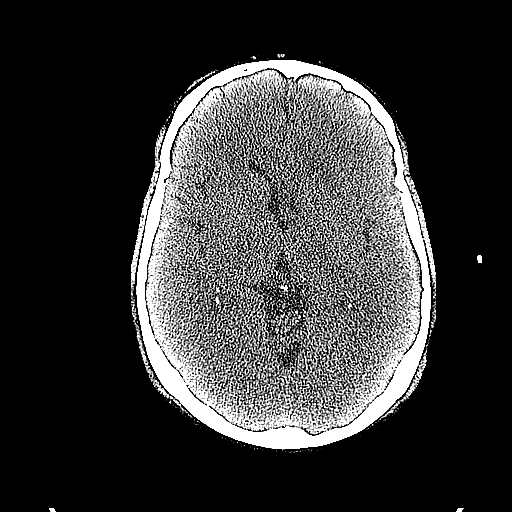
[im 30/72  brain]
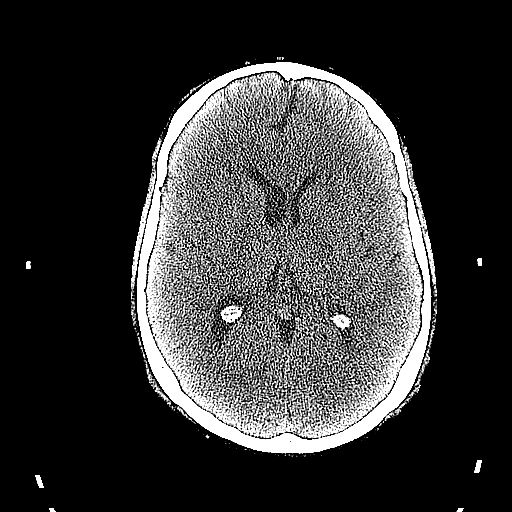
[im 38/72  brain]
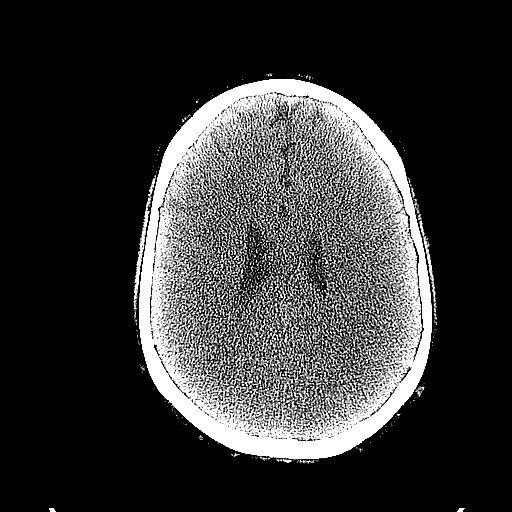
[im 42/72  brain]
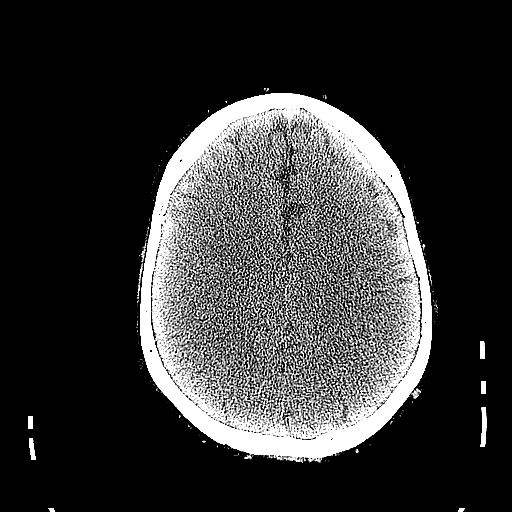
[im 42/72  bone]
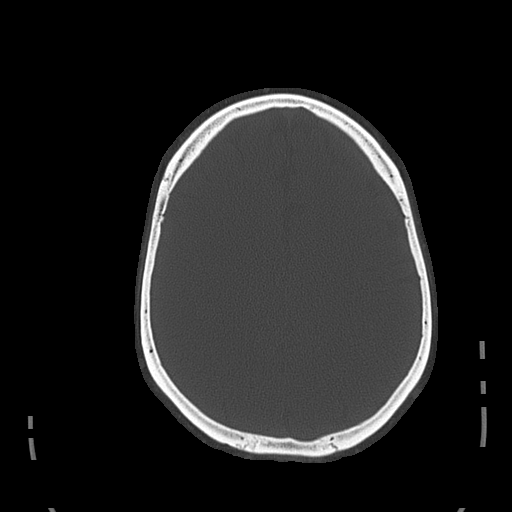
[im 45/72  brain]
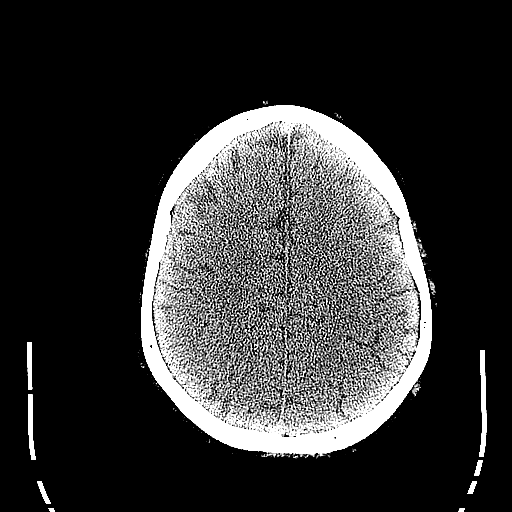
[im 49/72  brain]
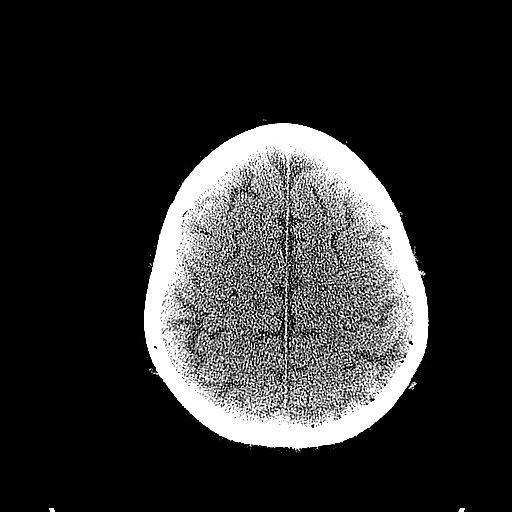
[im 53/72  brain]
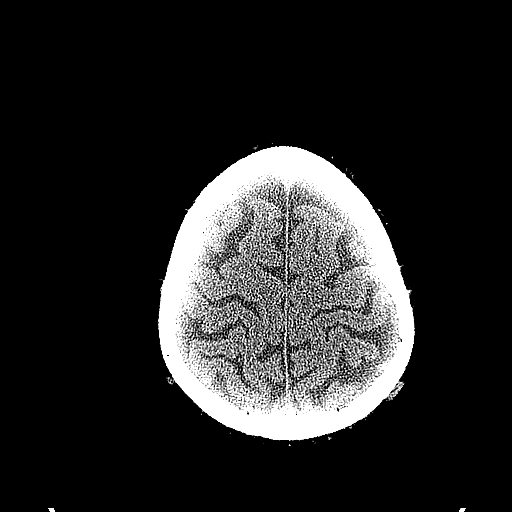
[im 60/72  brain]
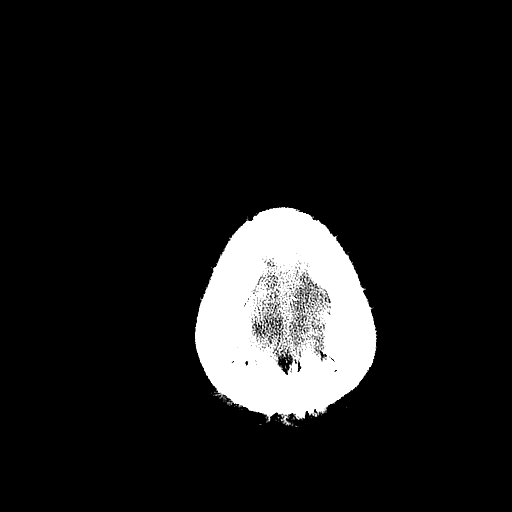
[im 60/72  bone]
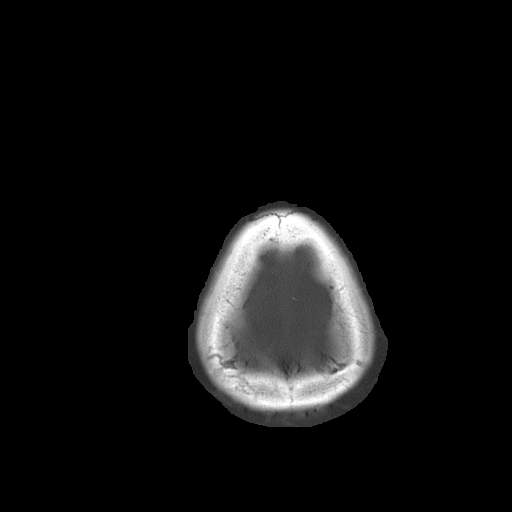
[im 64/72  brain]
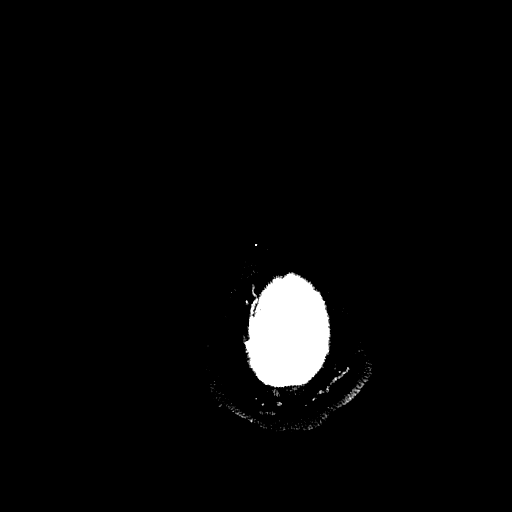
[im 68/72  brain]
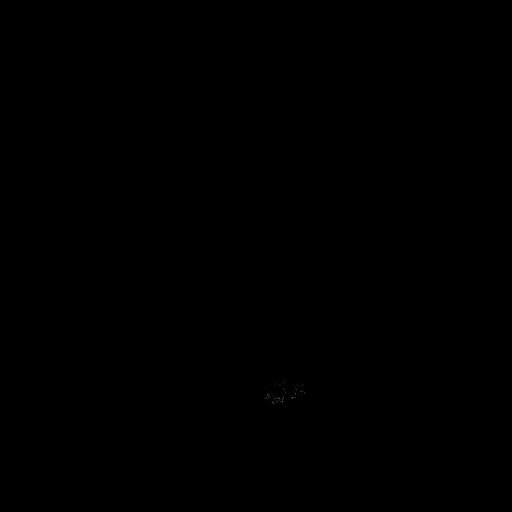

[15 of 30 positions shown; findings below may reference images not displayed]

FINDINGS: There is no evidence of acute infarction, mass lesion, or intra- or
extra-axial hemorrhage on CT.

The posterior fossa, including the cerebellum, brainstem and fourth
ventricle, is within normal limits. The third and lateral
ventricles, and basal ganglia are unremarkable in appearance. The
cerebral hemispheres are symmetric in appearance, with normal
gray-white differentiation. No mass effect or midline shift is seen.

There is no evidence of fracture; visualized osseous structures are
unremarkable in appearance. The orbits are within normal limits. The
paranasal sinuses and mastoid air cells are well-aerated. No
significant soft tissue abnormalities are seen.
IMPRESSION: Unremarkable noncontrast CT of the head.

## 2017-03-10 NOTE — Telephone Encounter (Signed)
Spoke with patient about lidoderm prior approval

## 2017-03-19 IMAGING — DX DG CHEST 2V
2 series · 2 of 2 positions shown · non-contrast
Comparison: None.

CLINICAL DATA: 54-year-old male with chest pain

EXAM:
CHEST  2 VIEW

[chest pa]
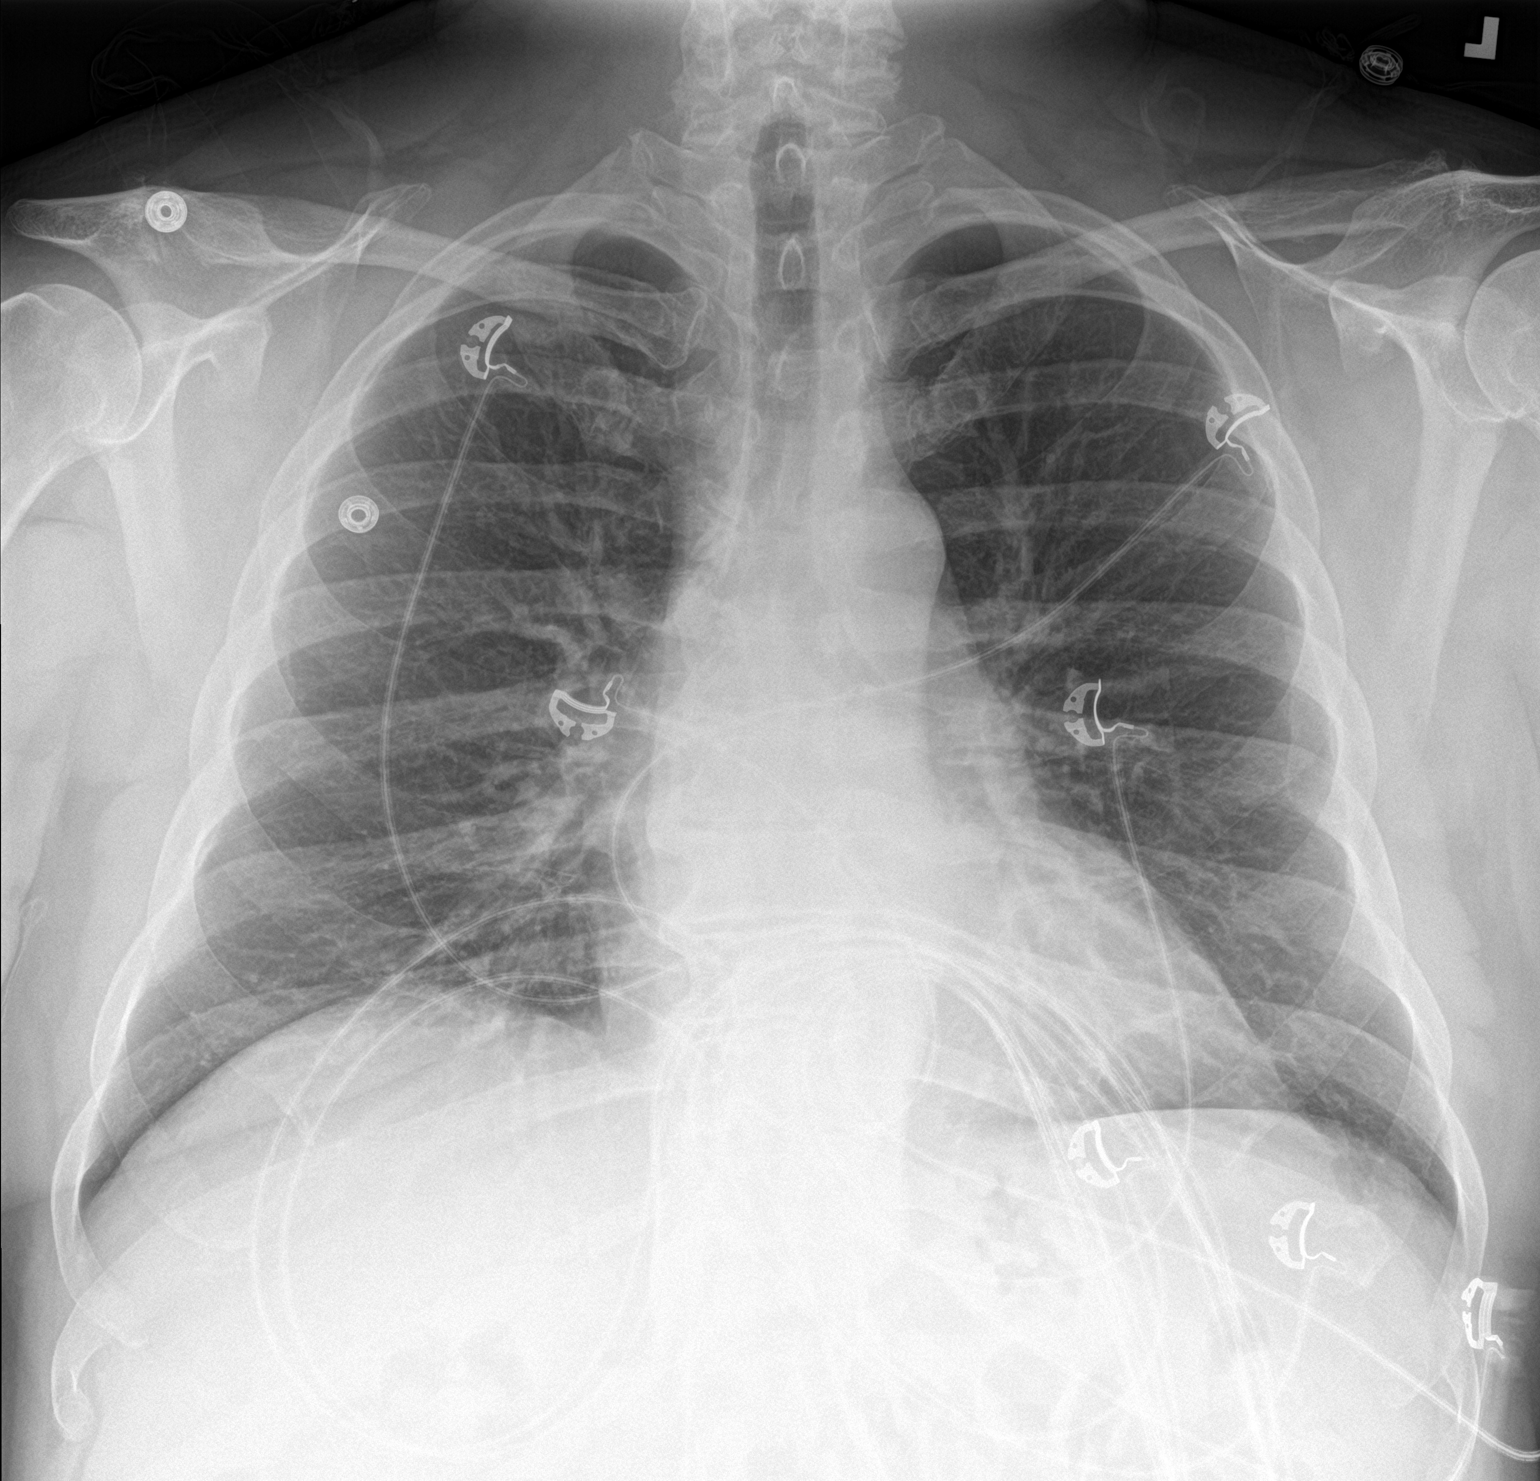

[chest lat]
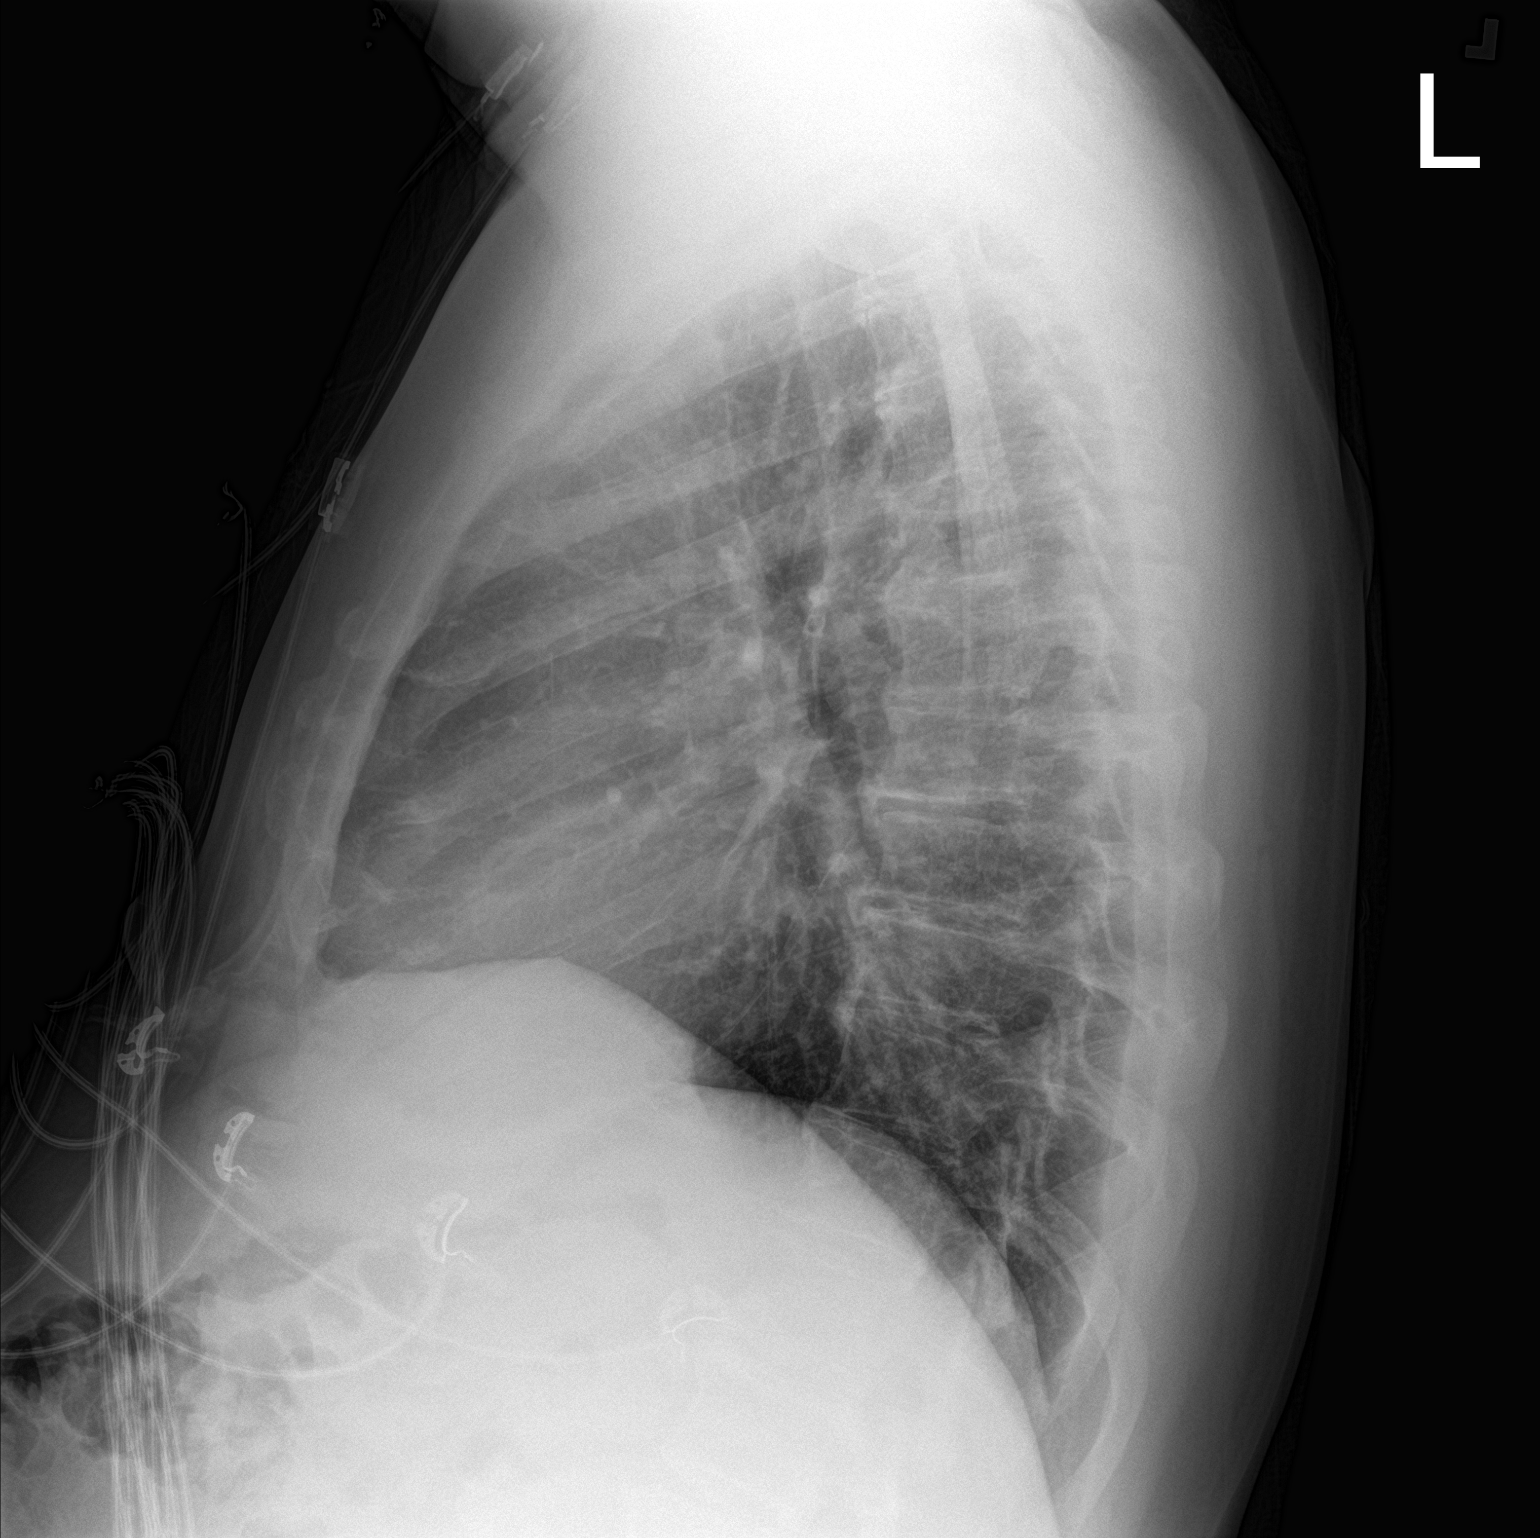

[2 of 2 positions shown; findings below may reference images not displayed]

FINDINGS: The heart size and mediastinal contours are within normal limits.
Both lungs are clear. The visualized skeletal structures are
unremarkable.
IMPRESSION: No active cardiopulmonary disease.

## 2017-03-27 ENCOUNTER — Encounter: Attending: Physical Medicine & Rehabilitation | Admitting: Physical Medicine & Rehabilitation

## 2017-03-27 ENCOUNTER — Encounter: Payer: Self-pay | Admitting: Physical Medicine & Rehabilitation

## 2017-03-27 VITALS — BP 127/77 | HR 74

## 2017-03-27 DIAGNOSIS — K5900 Constipation, unspecified: Secondary | ICD-10-CM | POA: Insufficient documentation

## 2017-03-27 DIAGNOSIS — G479 Sleep disorder, unspecified: Secondary | ICD-10-CM

## 2017-03-27 DIAGNOSIS — R269 Unspecified abnormalities of gait and mobility: Secondary | ICD-10-CM | POA: Diagnosis not present

## 2017-03-27 DIAGNOSIS — E785 Hyperlipidemia, unspecified: Secondary | ICD-10-CM | POA: Insufficient documentation

## 2017-03-27 DIAGNOSIS — G8918 Other acute postprocedural pain: Secondary | ICD-10-CM

## 2017-03-27 DIAGNOSIS — I10 Essential (primary) hypertension: Secondary | ICD-10-CM | POA: Diagnosis not present

## 2017-03-27 DIAGNOSIS — F329 Major depressive disorder, single episode, unspecified: Secondary | ICD-10-CM

## 2017-03-27 DIAGNOSIS — M109 Gout, unspecified: Secondary | ICD-10-CM | POA: Diagnosis not present

## 2017-03-27 DIAGNOSIS — T1490XA Injury, unspecified, initial encounter: Secondary | ICD-10-CM | POA: Diagnosis not present

## 2017-03-27 DIAGNOSIS — Z8546 Personal history of malignant neoplasm of prostate: Secondary | ICD-10-CM | POA: Insufficient documentation

## 2017-03-27 DIAGNOSIS — G4700 Insomnia, unspecified: Secondary | ICD-10-CM | POA: Insufficient documentation

## 2017-03-27 DIAGNOSIS — S01311A Laceration without foreign body of right ear, initial encounter: Secondary | ICD-10-CM | POA: Diagnosis not present

## 2017-03-27 DIAGNOSIS — E7800 Pure hypercholesterolemia, unspecified: Secondary | ICD-10-CM | POA: Insufficient documentation

## 2017-03-27 DIAGNOSIS — M546 Pain in thoracic spine: Secondary | ICD-10-CM | POA: Diagnosis not present

## 2017-03-27 DIAGNOSIS — M5416 Radiculopathy, lumbar region: Secondary | ICD-10-CM | POA: Diagnosis not present

## 2017-03-27 DIAGNOSIS — Z85828 Personal history of other malignant neoplasm of skin: Secondary | ICD-10-CM | POA: Diagnosis not present

## 2017-03-27 DIAGNOSIS — K219 Gastro-esophageal reflux disease without esophagitis: Secondary | ICD-10-CM | POA: Insufficient documentation

## 2017-03-27 DIAGNOSIS — X58XXXA Exposure to other specified factors, initial encounter: Secondary | ICD-10-CM | POA: Diagnosis not present

## 2017-03-27 DIAGNOSIS — G894 Chronic pain syndrome: Secondary | ICD-10-CM

## 2017-03-27 DIAGNOSIS — R251 Tremor, unspecified: Secondary | ICD-10-CM

## 2017-03-27 MED ORDER — TRAMADOL HCL 50 MG PO TABS: 50.0000 mg | ORAL_TABLET | Freq: Two times a day (BID) | ORAL | 2 refills | Status: DC | PRN
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
## 2017-04-01 ENCOUNTER — Other Ambulatory Visit: Payer: Self-pay | Admitting: *Deleted

## 2017-04-01 MED ORDER — AMITRIPTYLINE HCL 25 MG PO TABS: 25.0000 mg | ORAL_TABLET | Freq: Every day | ORAL | 0 refills | Status: DC

## 2017-06-10 IMAGING — DX DG CHEST 2V
2 series · 2 of 2 positions shown · non-contrast
Comparison: Radiograph April 15, 2015.

CLINICAL DATA: Chest pain.

EXAM:
CHEST  2 VIEW

[chest pa]
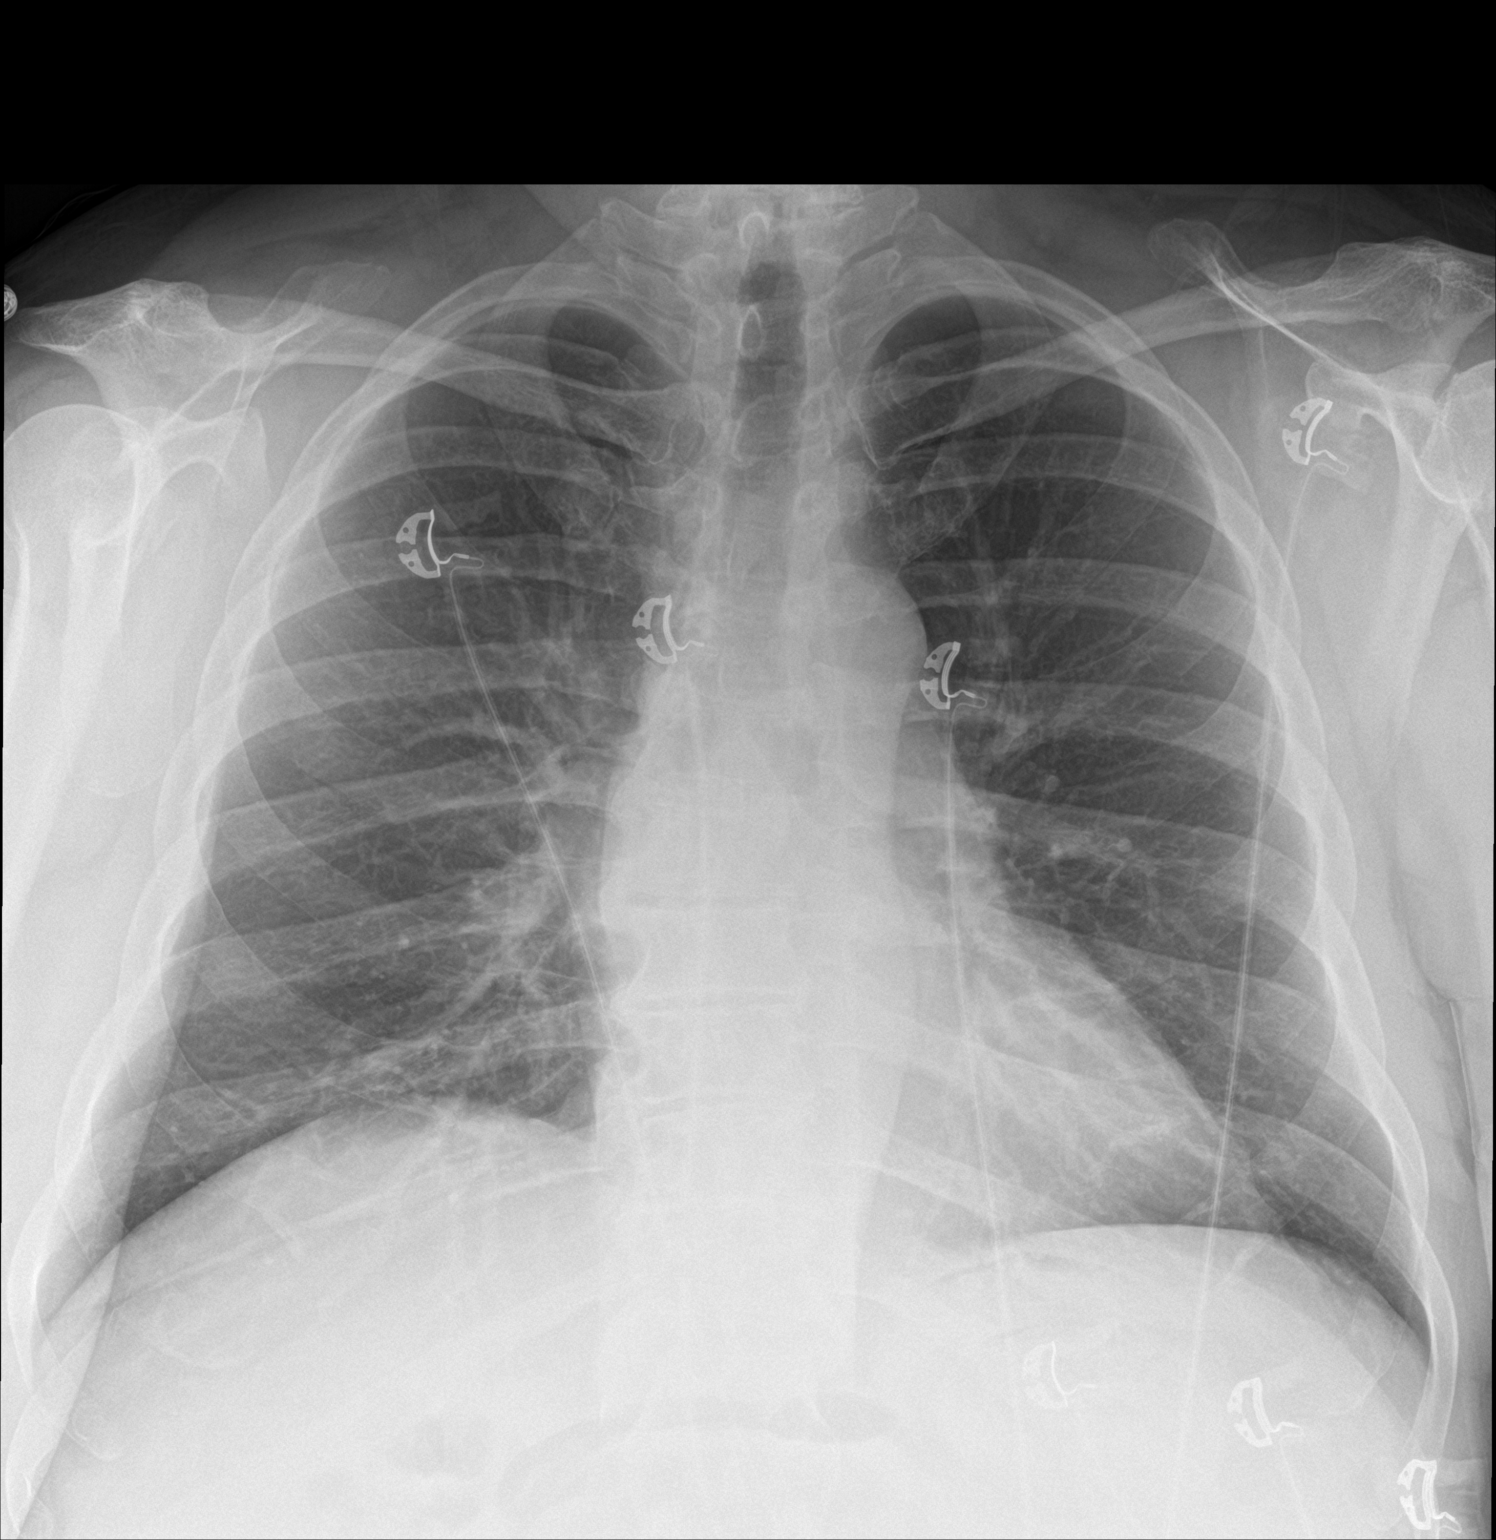

[chest lat]
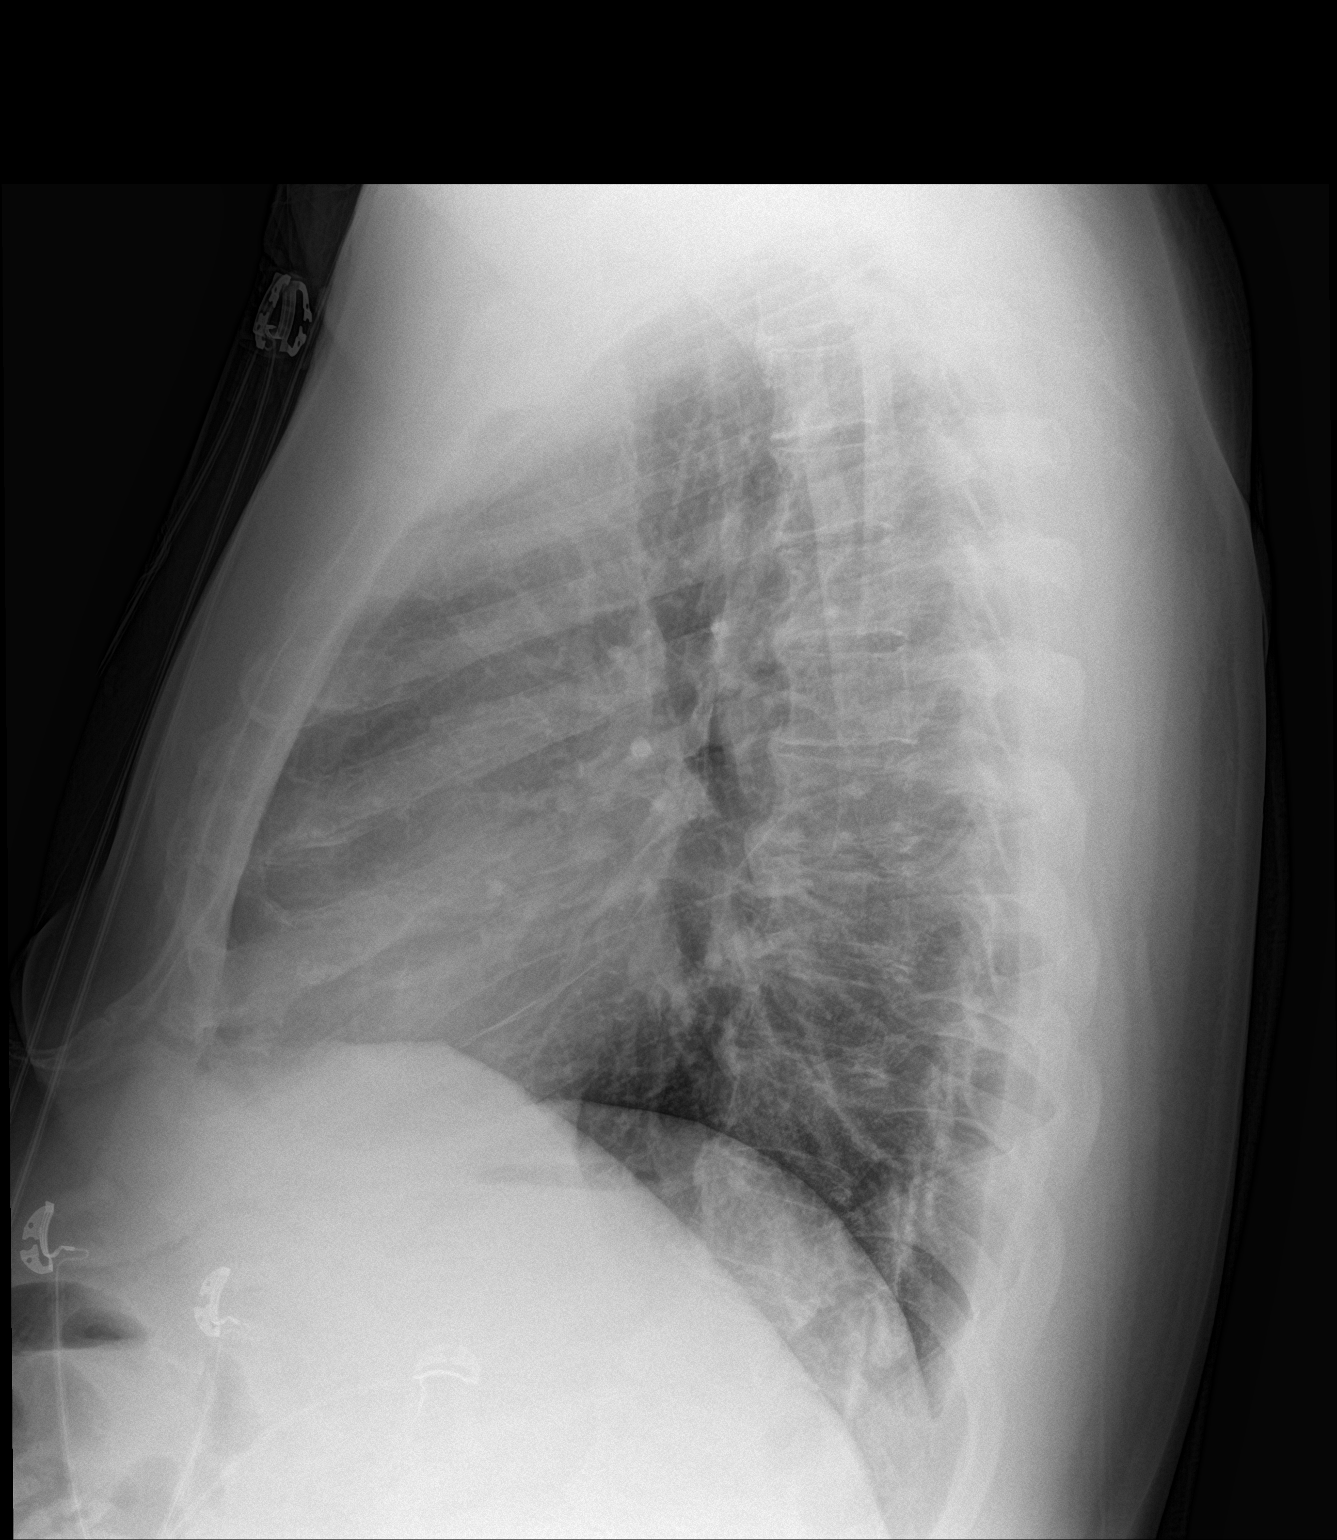

[2 of 2 positions shown; findings below may reference images not displayed]

FINDINGS: The heart size and mediastinal contours are within normal limits.
Both lungs are clear. No pneumothorax or pleural effusion is noted.
The visualized skeletal structures are unremarkable.
IMPRESSION: No active cardiopulmonary disease.

## 2017-06-26 ENCOUNTER — Encounter: Admitting: Physical Medicine & Rehabilitation

## 2017-06-29 IMAGING — DX DG ABDOMEN ACUTE W/ 1V CHEST
4 series · 4 of 4 positions shown · non-contrast
Comparison: 07/07/2015

CLINICAL DATA: Severe upper abdominal pain in chest pain beginning
2 hours ago. Colonoscopy due date.

EXAM:
DG ABDOMEN ACUTE W/ 1V CHEST

[chest pa]
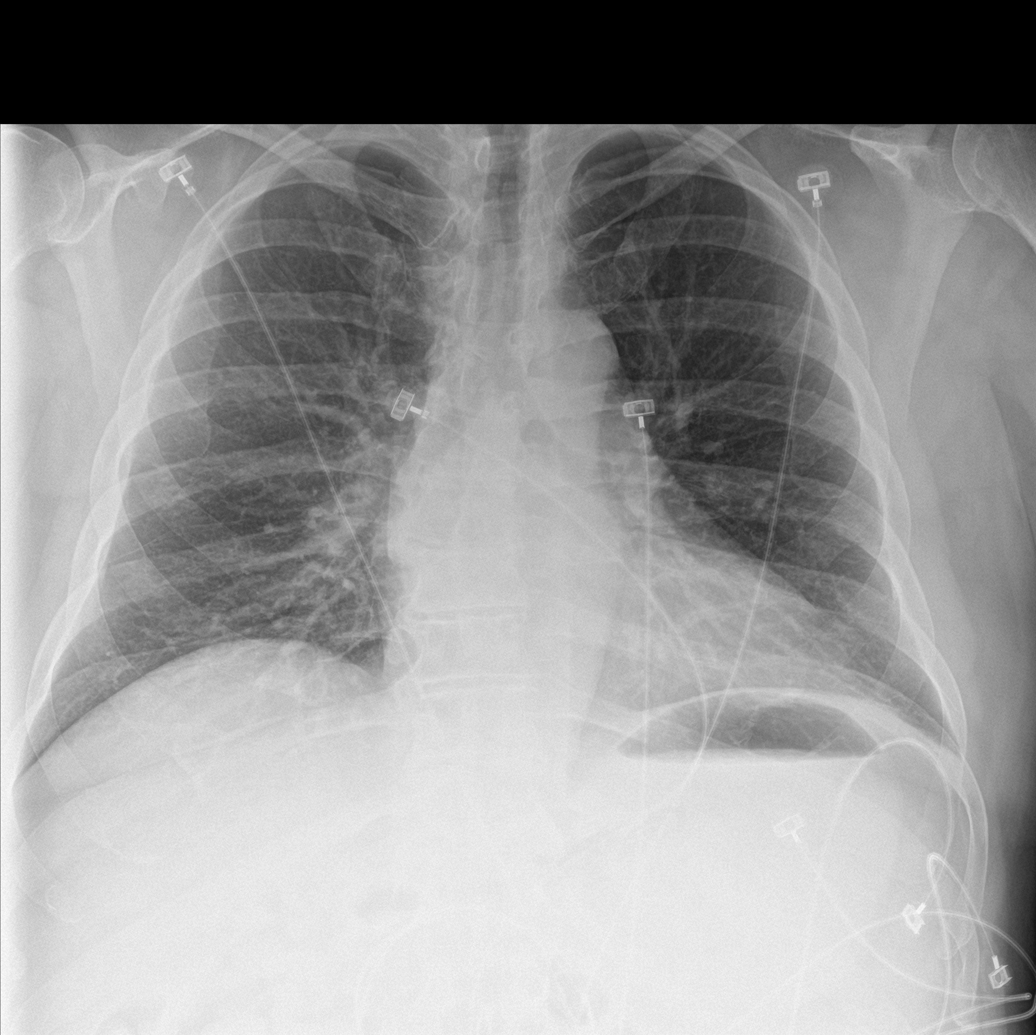

[abdomen erect]
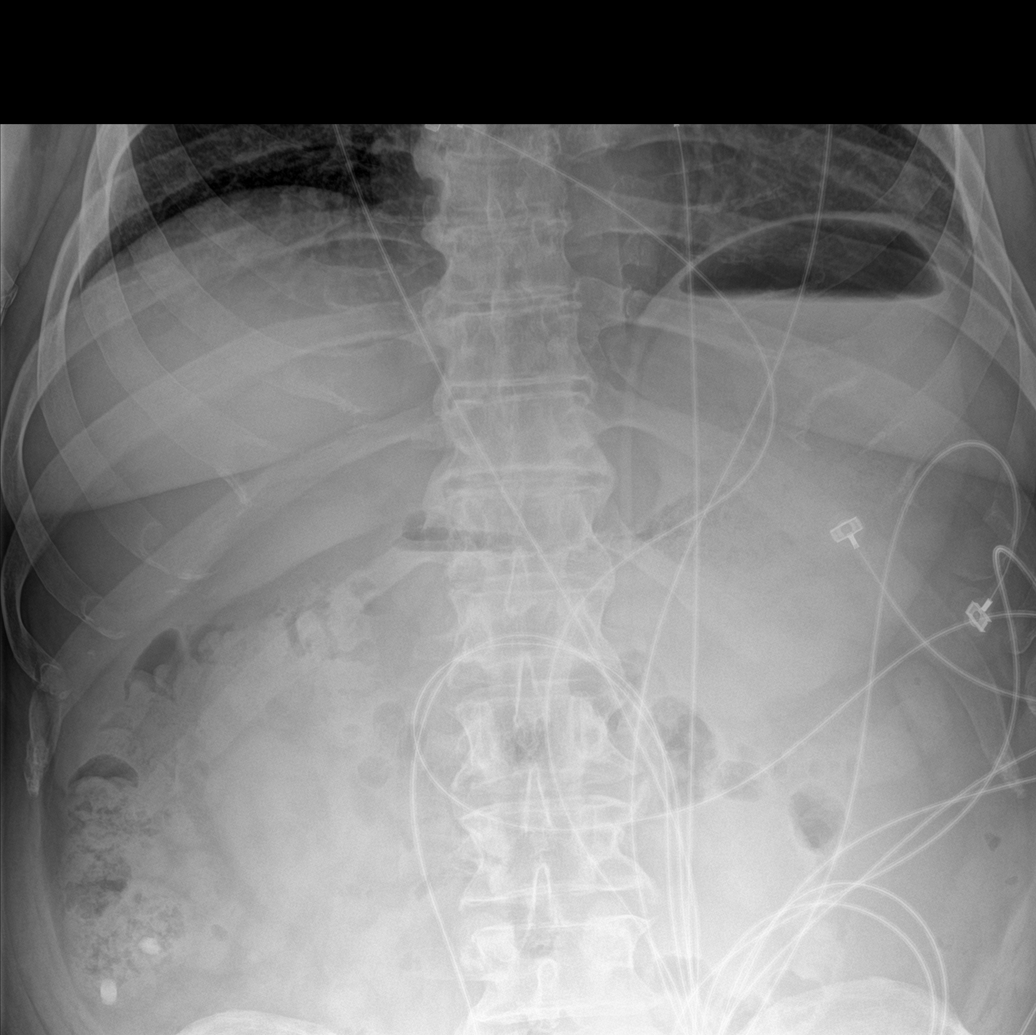

[abdomen supine (1 of 2)]
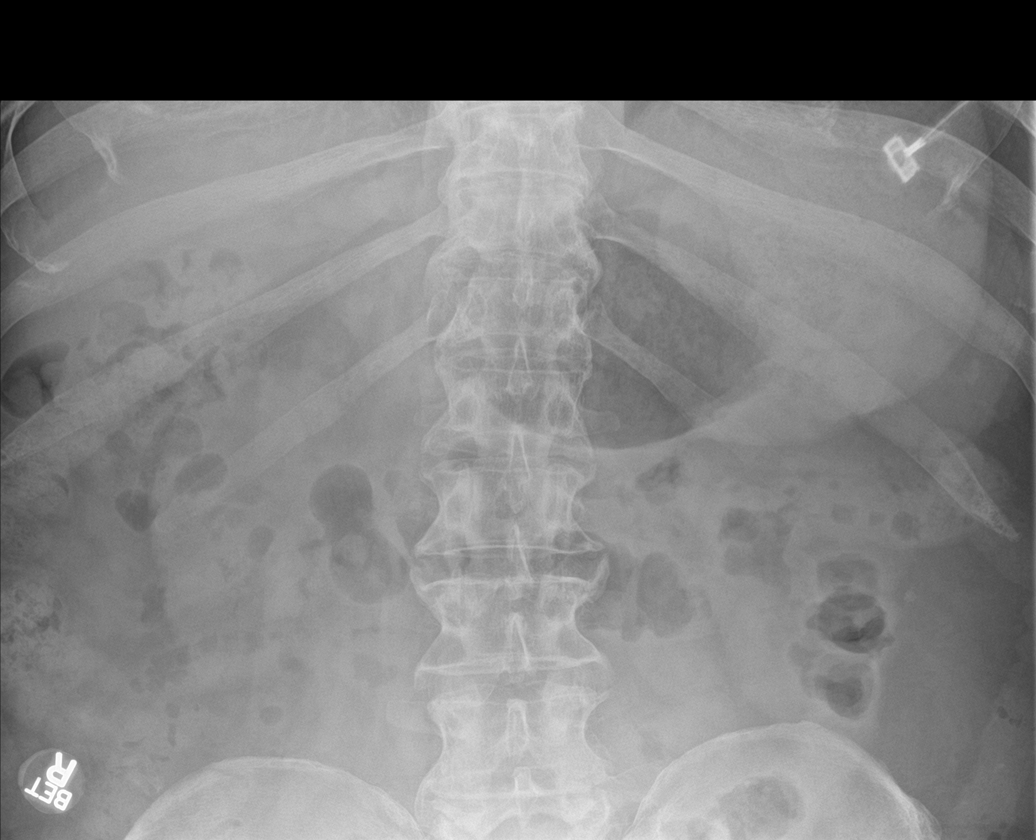

[abdomen supine (2 of 2)]
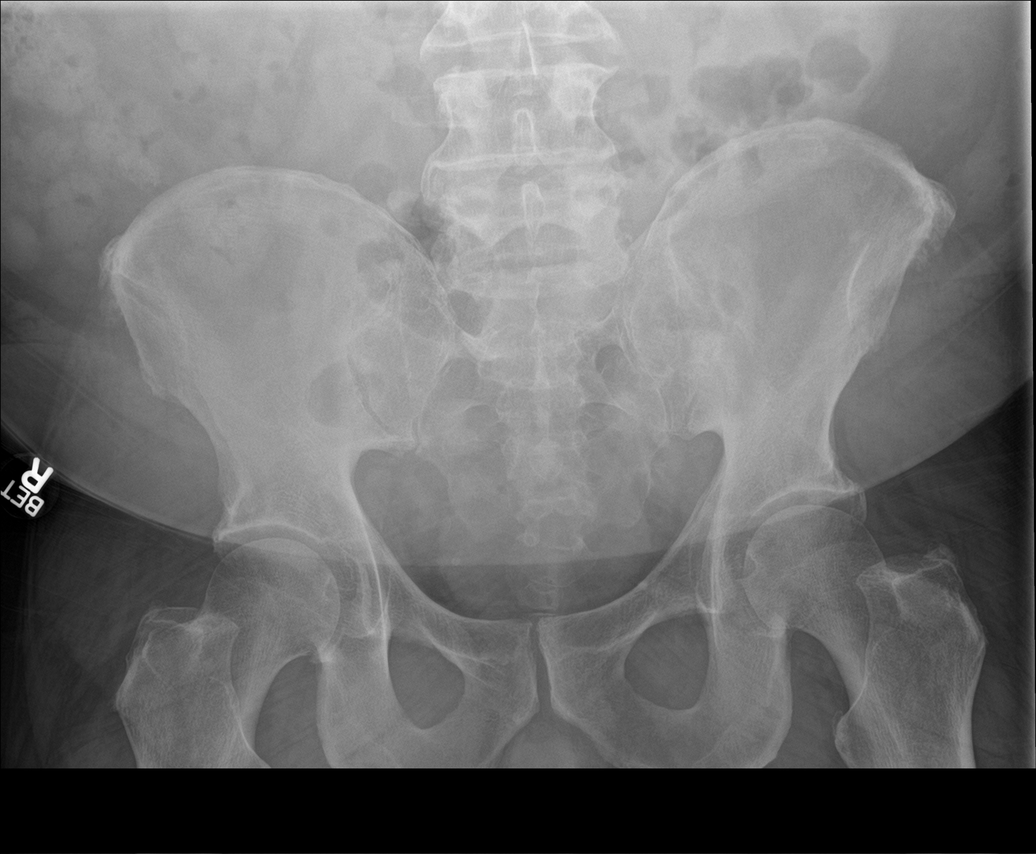

[4 of 4 positions shown; findings below may reference images not displayed]

FINDINGS: No evidence of ileus, obstruction or free air. The patient appears
to have fecal matter in the colon, unusual given the history of
colonoscopy due day. No abnormal calcifications are bony findings.

One view chest shows normal heart and mediastinal shadows. The lungs
are clear. No free air seen under the diaphragm.
IMPRESSION: Negative abdominal radiographs.  No acute cardiopulmonary disease.

## 2017-06-30 IMAGING — CT CT ANGIO CHEST-ABD-PELV FOR DISSECTION W/ AND WO/W CM
2 of 12 series · 11 of 46 positions shown, 15 images · IV contrast (isovue)
Comparison: 07/27/2015 CT chest, abdomen and pelvis (earlier
today).

CLINICAL DATA: 55-year-old male with severe lower abdominal and
right flank pain one day status post colonoscopy. Chest pain and
nausea and vomiting one night prior. Leukocytosis.

EXAM:
CT ANGIOGRAPHY CHEST, ABDOMEN AND PELVIS
TECHNIQUE: Multidetector CT imaging through the chest, abdomen and pelvis was
performed using the standard protocol during bolus administration of
intravenous contrast. Multiplanar reconstructed images and MIPs were
obtained and reviewed to evaluate the vascular anatomy.
CONTRAST:  100 cc Isovue 370 IV.

[Series 5: arterial 3.0 b30f · axial · arterial · 0.82mm/px · z∈[+1116,+1611]mm · 10 of 191 slices shown, 13 images]
[im 13/191  soft-tissue]
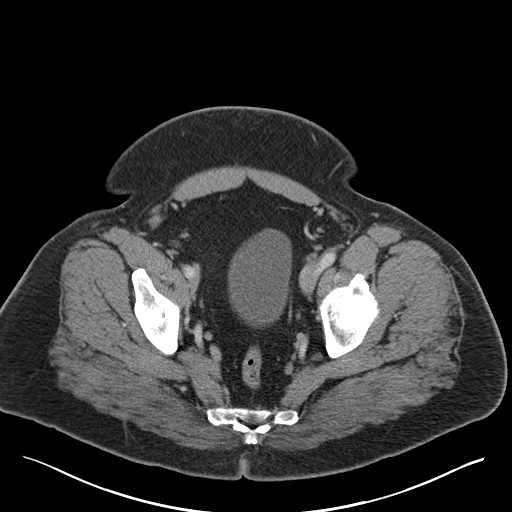
[im 13/191  bone]
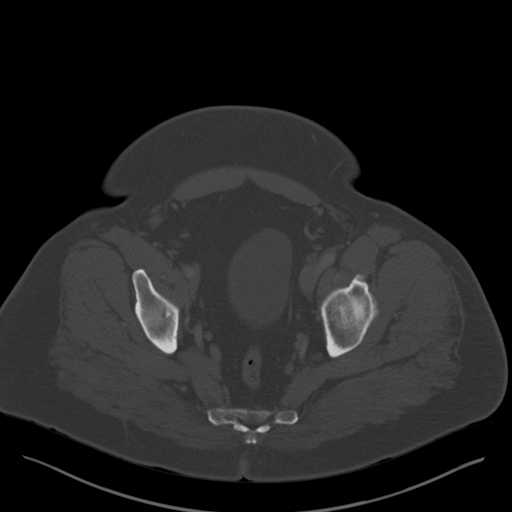
[im 39/191  soft-tissue]
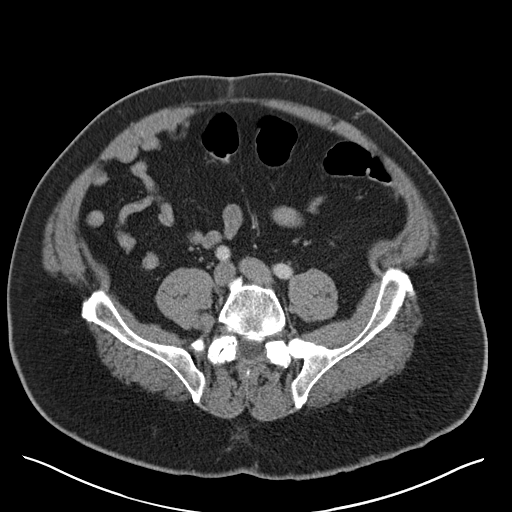
[im 64/191  soft-tissue]
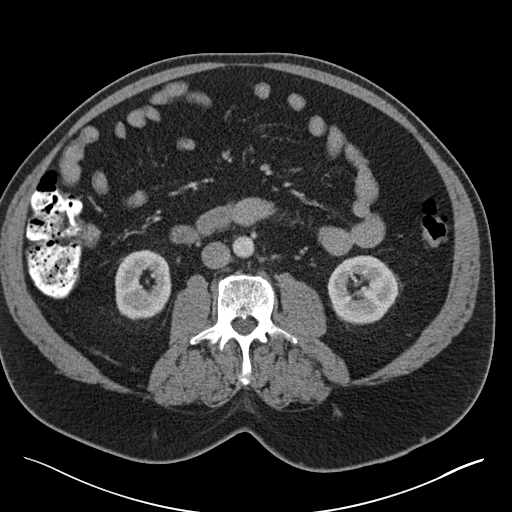
[im 89/191  soft-tissue]
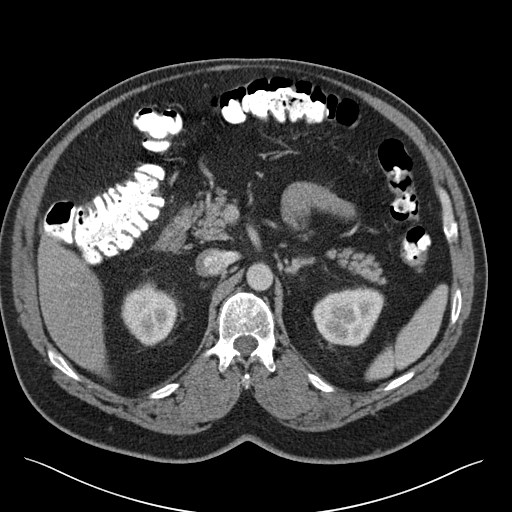
[im 102/191  soft-tissue]
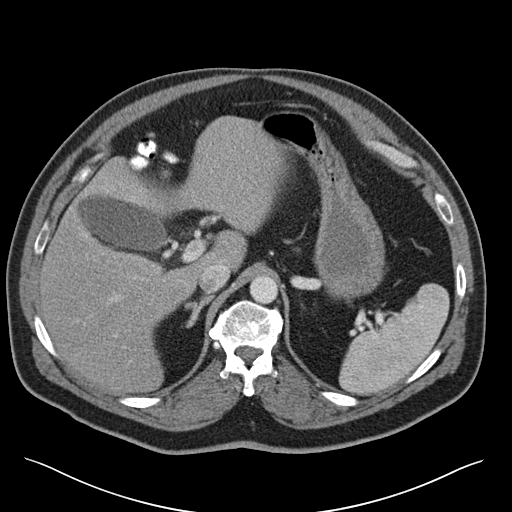
[im 127/191  soft-tissue]
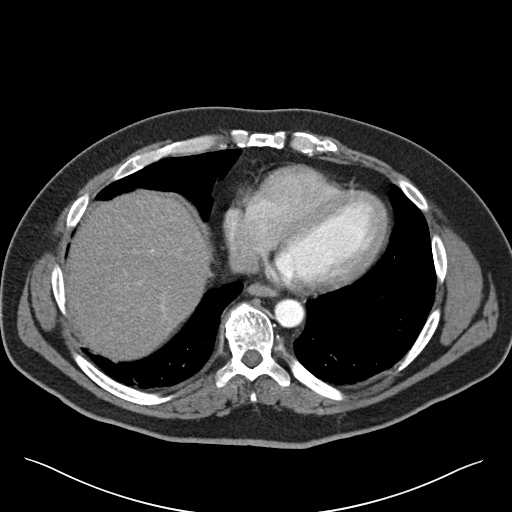
[im 140/191  lung]
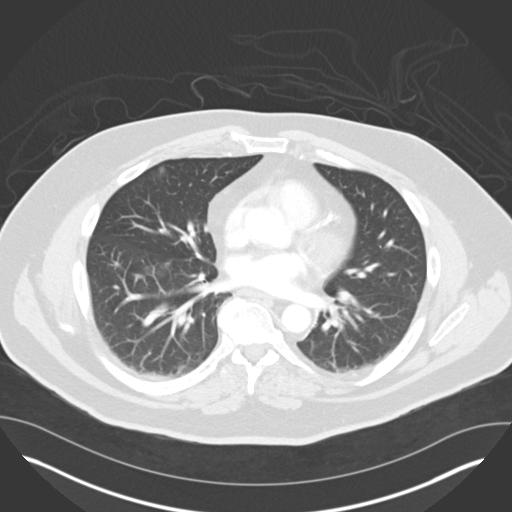
[im 153/191  soft-tissue]
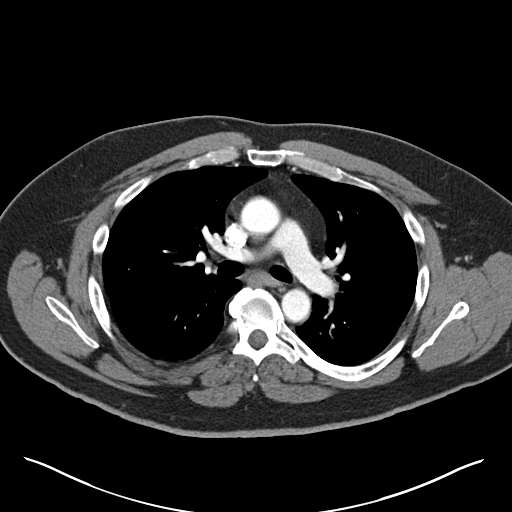
[im 153/191  lung]
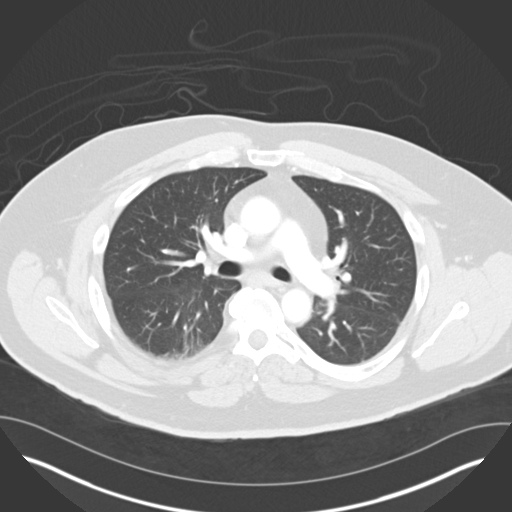
[im 165/191  lung]
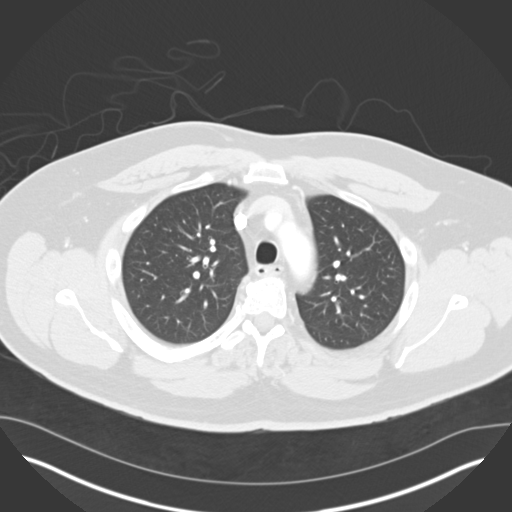
[im 178/191  soft-tissue]
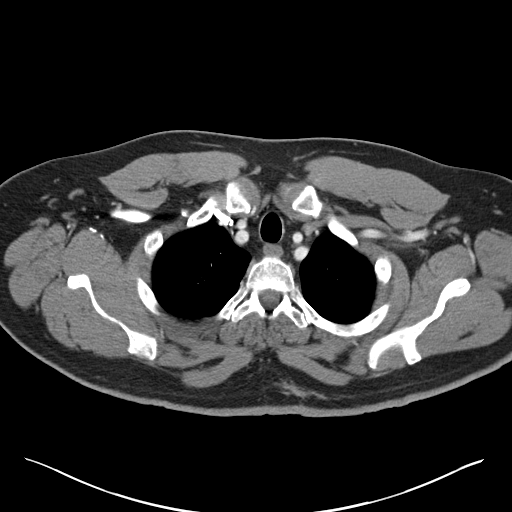
[im 178/191  lung]
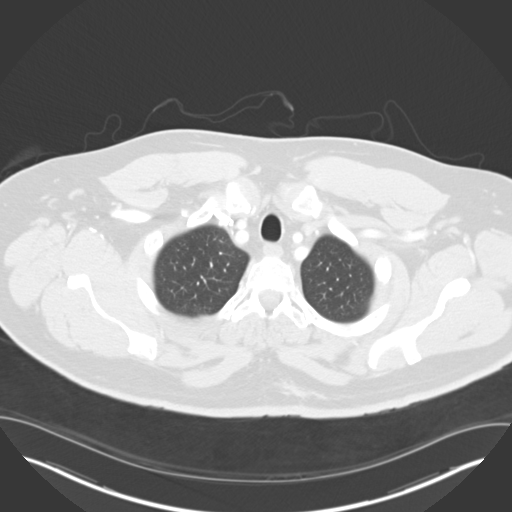

[Series 10: coronal mpr · coronal · 0.84mm/px · 1 of 159 slices shown, 2 images]
[im 80/159  soft-tissue]
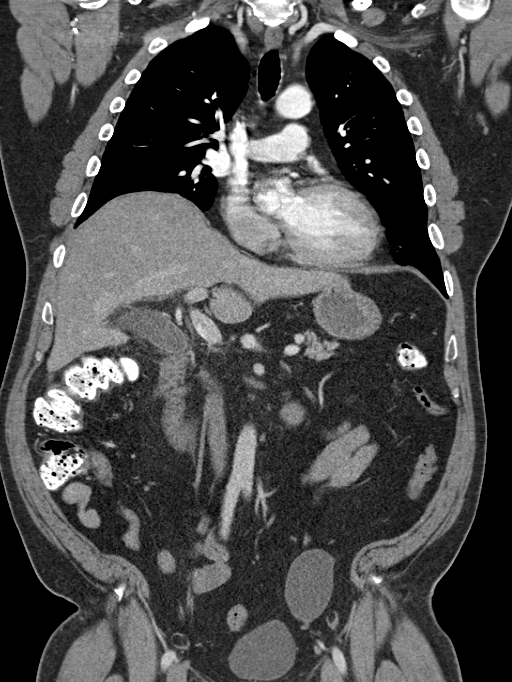
[im 80/159  bone]
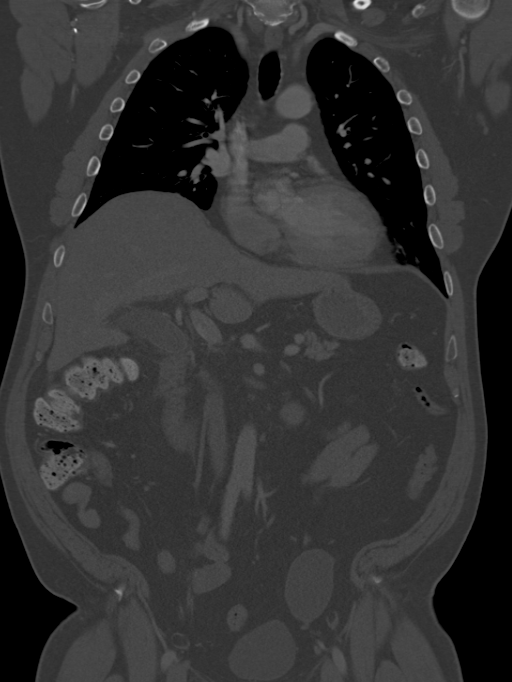

[11 of 46 positions shown; findings below may reference images not displayed]

FINDINGS: CTA CHEST FINDINGS

Mediastinum/Nodes: Normal heart size. No significant pericardial
fluid/thickening. Left anterior descending, left circumflex and
right coronary atherosclerosis. Minimally atherosclerotic
nonaneurysmal thoracic aorta. No acute intramural aortic hematoma.
No aortic dissection, pseudoaneurysm or penetrating atherosclerotic
ulcer. Aortic arch vessels are patent. Normal caliber pulmonary
arteries. No central pulmonary emboli. No discrete thyroid nodules.
Unremarkable esophagus. No pathologically enlarged axillary,
mediastinal or hilar lymph nodes.

Lungs/Pleura: No pneumothorax. No pleural effusion. Mild
hypoventilatory changes in the dependent lower lobes. No acute
consolidative airspace disease, significant pulmonary nodules or
lung masses.

Musculoskeletal: No aggressive appearing focal osseous lesions.
Mild-to-moderate thoracic spondylosis. Subcutaneous circumscribed
2.8 x 1.5 cm cystic structure in the medial left upper back (series
2/image 6), with mild wall thickening and surrounding mild fat
stranding, favor a sebaceous cyst.

Review of the MIP images confirms the above findings.

CTA ABDOMEN AND PELVIS FINDINGS

Hepatobiliary: Mild-to-moderate diffuse hepatic steatosis. No liver
mass. No definite liver surface irregularity. Mild gallbladder
distention. Suggestion of mild diffuse gallbladder wall thickening
and pericholecystic fat stranding, new since this morning. No
radiopaque cholelithiasis. No biliary ductal dilatation.

Pancreas: Normal, with no mass or duct dilation.

Spleen: Normal size. No mass.

Adrenals/Urinary Tract: Normal adrenals. Subcentimeter hypodense
renal cortical lesions in the mid to lower right kidney are too
small to characterize. Otherwise normal kidneys, with no
hydronephrosis. Normal bladder.

Stomach/Bowel: Grossly normal stomach. Mild wall thickening in the
descending duodenum with mild associated fat stranding surrounding
the descending duodenum, which appears new. Normal caliber small
bowel with no small bowel wall thickening. Normal appendix. Normal
large bowel with no diverticulosis, large bowel wall thickening or
pericolonic fat stranding. No pneumatosis.

Vascular/Lymphatic: Mildly atherosclerotic nonaneurysmal abdominal
aorta. No abdominal aortic dissection, pseudoaneurysm or penetrating
atherosclerotic ulcer. Celiac artery, superior mesenteric artery,
splenic artery, common hepatic artery, inferior mesenteric artery
and bilateral single appearing renal arteries appear patent. Splenic
and renal veins are patent. No pathologically enlarged lymph nodes
in the abdomen or pelvis.

Reproductive: Normal size prostate.

Other: No pneumoperitoneum, ascites or focal fluid collection.
Peripherally calcified circumscribed 4.4 x 4.2 cm cystic structure
in the left anterior pelvis abutting the left external iliac vein
(series 5/ image 167) is not appreciably changed in size since
09/18/2013, consistent with a benign extraperitoneal left pelvic
cyst.

Musculoskeletal: No aggressive appearing focal osseous lesions. Mild
lumbar spondylosis.

Review of the MIP images confirms the above findings.
IMPRESSION: 1. No acute aortic syndrome.  Aortic atherosclerosis.
2. New gallbladder and descending duodenal wall thickening and new
pericholecystic and peri-duodenal fat stranding. These findings are
of uncertain etiology. Acute cholecystitis is a consideration. No
radiopaque cholelithiasis. Acute infectious or inflammatory
duodenitis or duodenal ulcer disease are considerations. Consider
correlation with right upper quadrant abdominal sonogram.
3. No biliary ductal dilatation.
4. No pneumatosis or pneumoperitoneum.  No focal fluid collections.
5. Benign-appearing rim calcified extraperitoneal left anterior
pelvic cyst is stable since [DATE]. Three-vessel coronary atherosclerosis.
7. Mild-to-moderate diffuse hepatic steatosis.

## 2017-06-30 IMAGING — DX DG ABDOMEN 2V
3 series · 3 of 3 positions shown · non-contrast
Comparison: 07/27/2015 CT abdomen/ pelvis.

CLINICAL DATA: Generalized abdominal pain. Status post colonoscopy
1 day prior.

EXAM:
ABDOMEN - 2 VIEW

[abdomen erect]
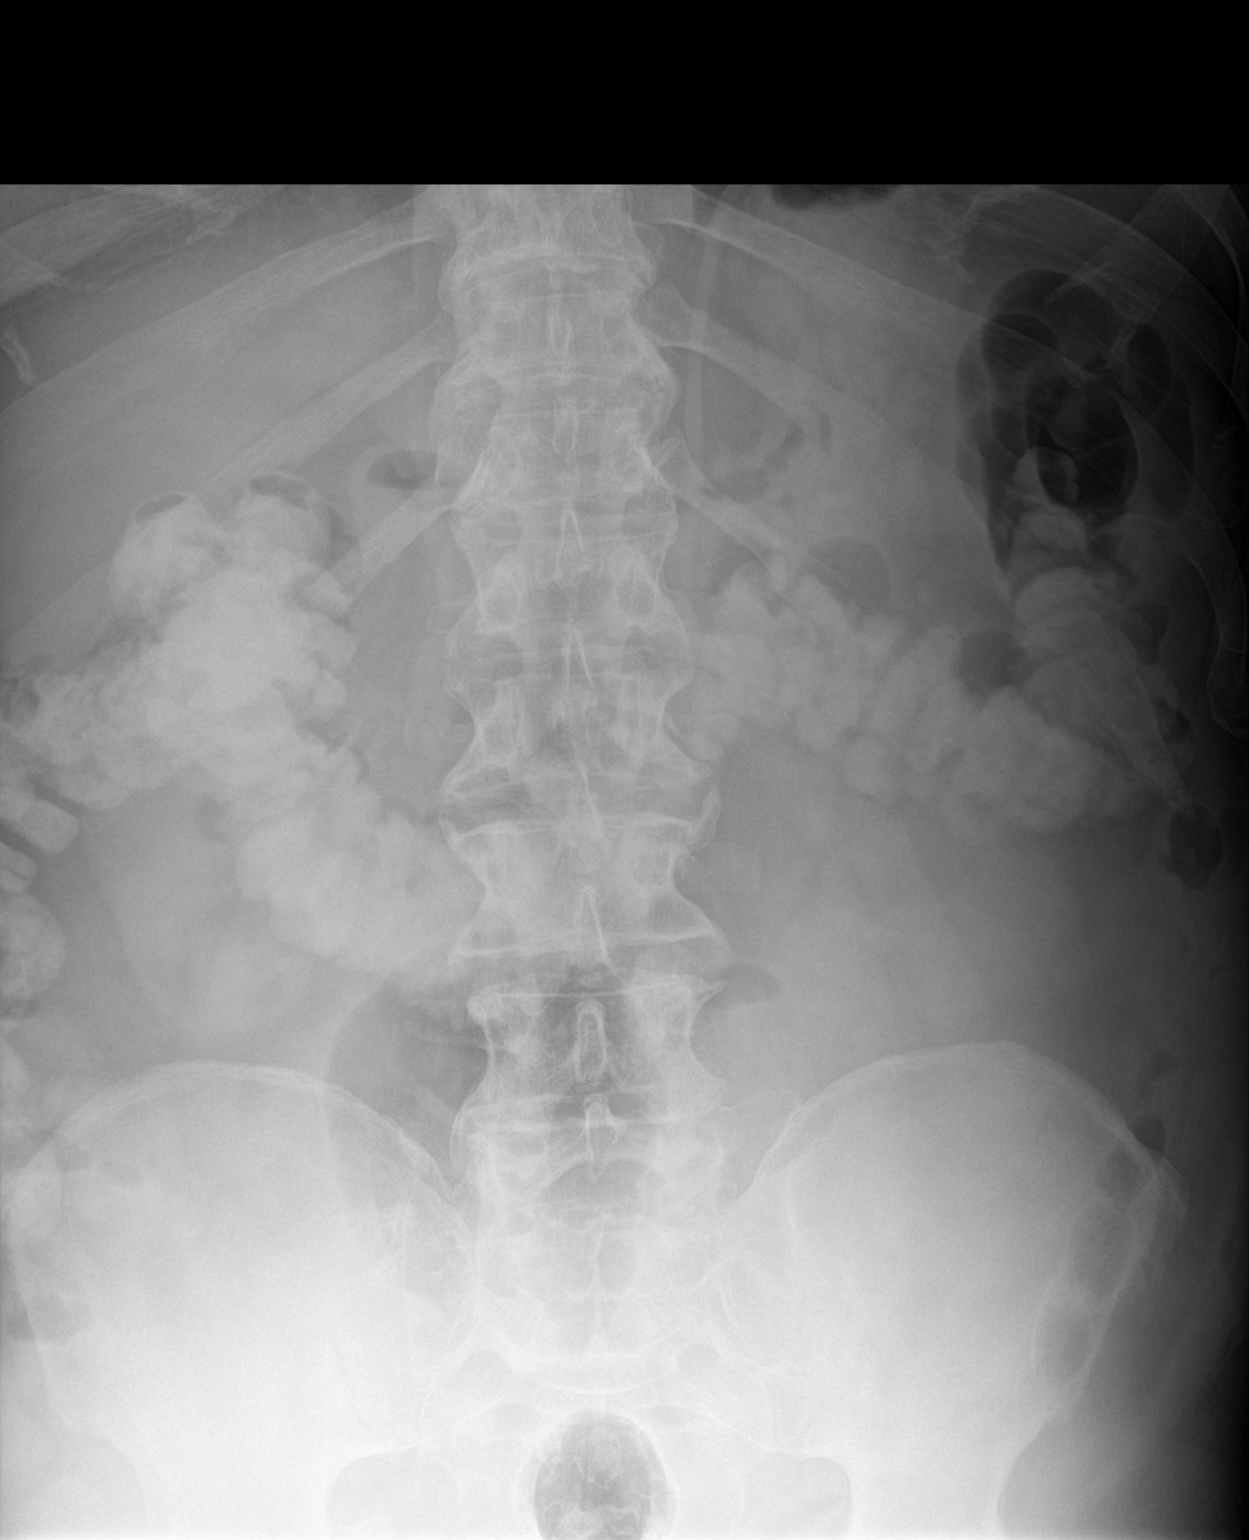

[abdomen supine (1 of 2)]
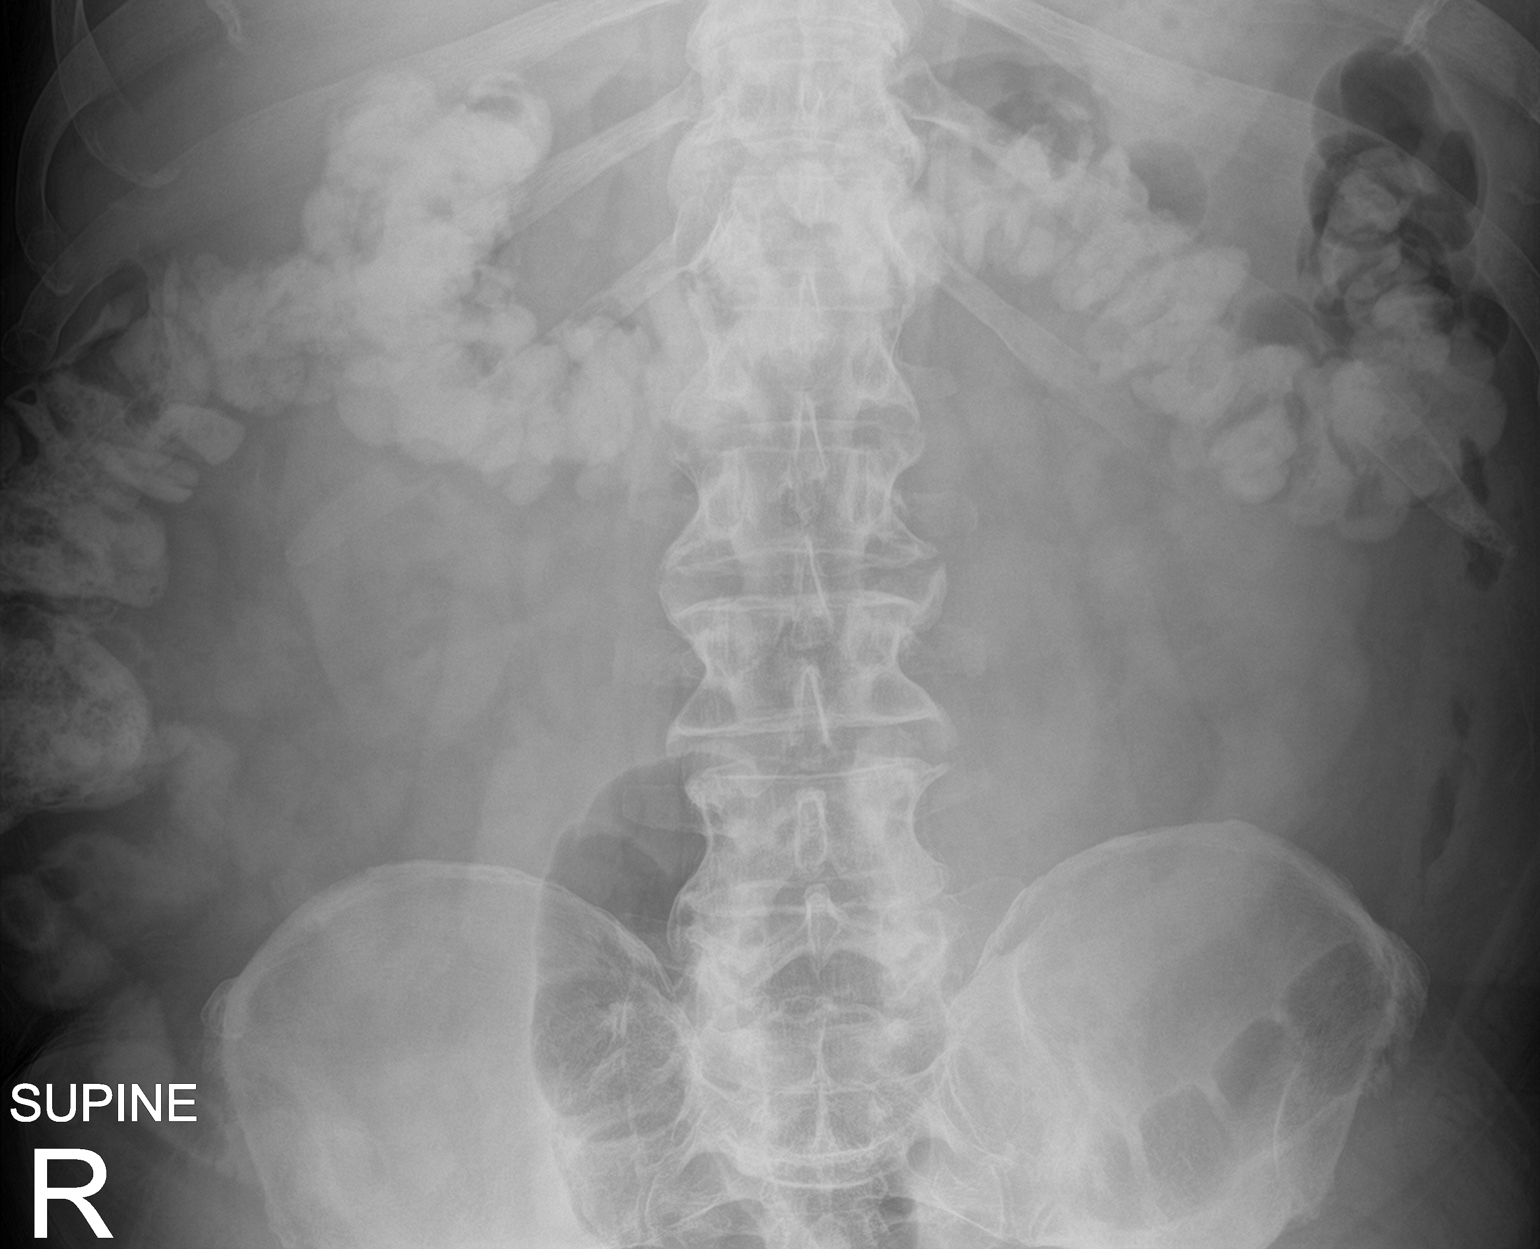

[abdomen supine (2 of 2)]
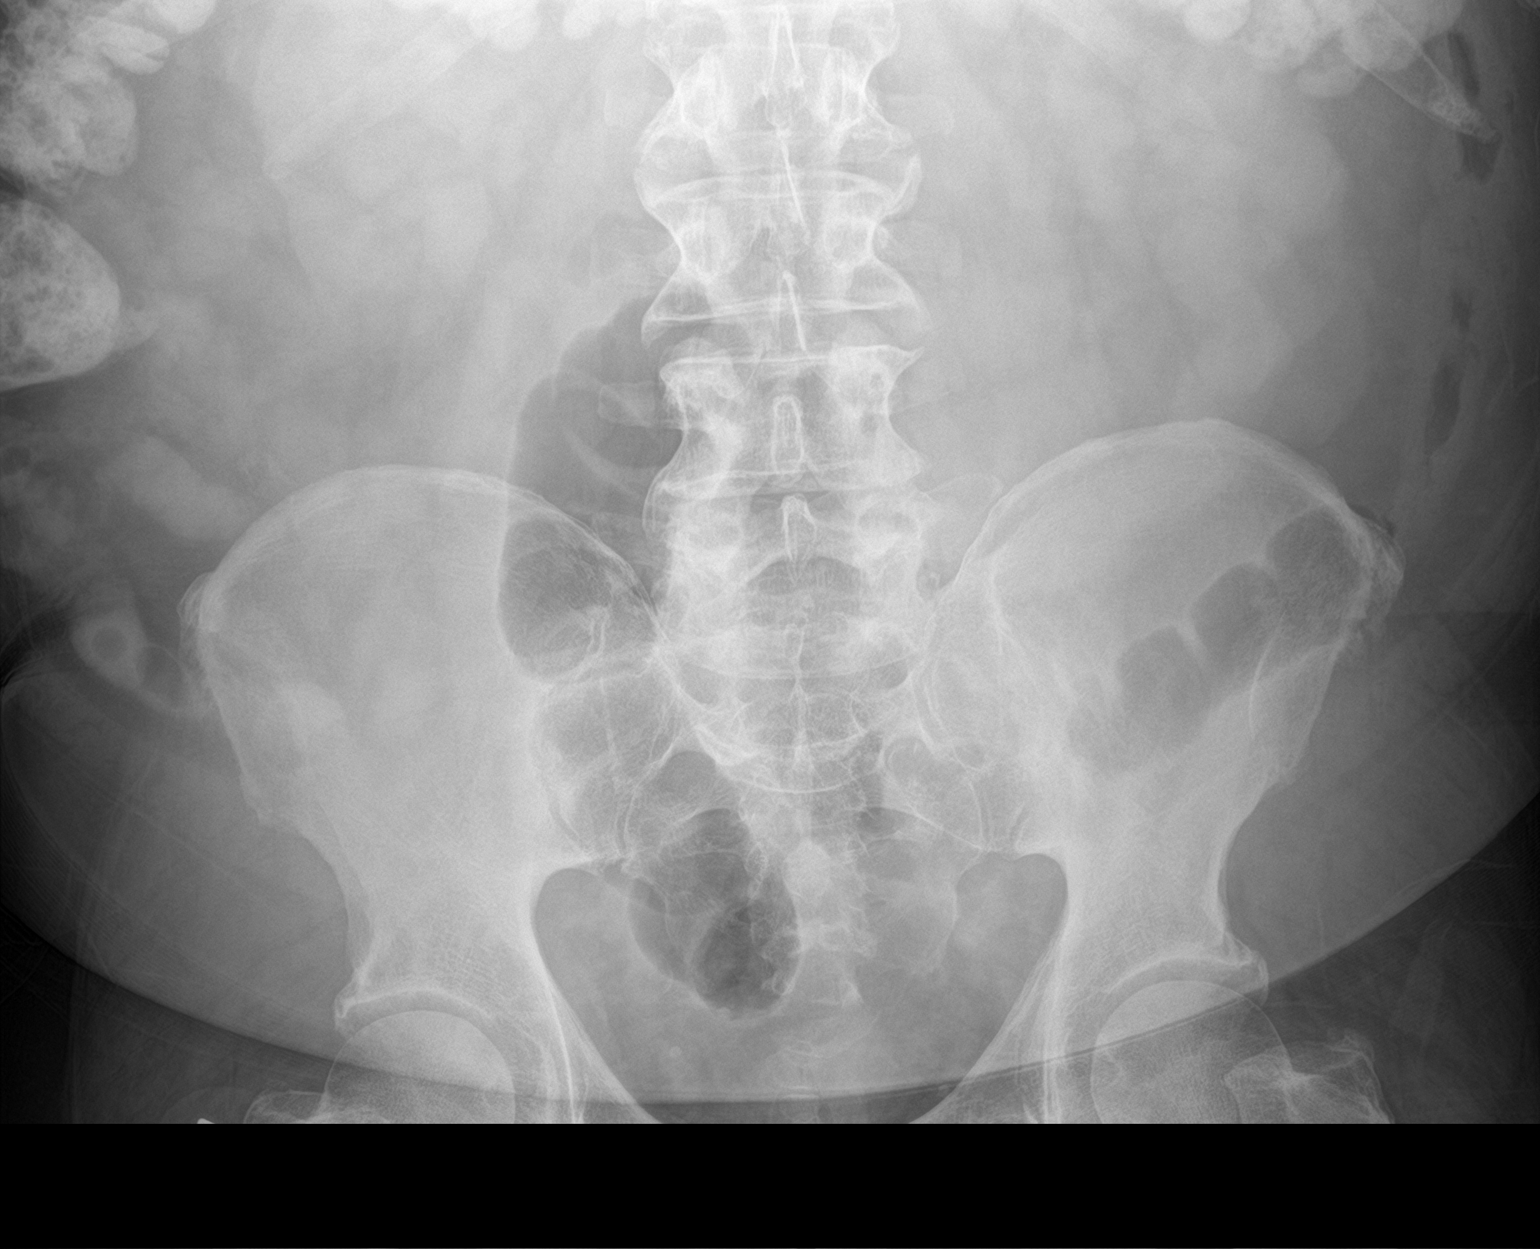

[3 of 3 positions shown; findings below may reference images not displayed]

FINDINGS: No dilated small bowel loops. Oral contrast has progressed to the
splenic flexure of the colon. No evidence of pneumatosis,
pneumoperitoneum or pathologic soft tissue calcification. Moderate
thoracolumbar spondylosis.
IMPRESSION: Nonobstructive bowel gas pattern. Progression of oral contrast to
the splenic flexure of the colon. No evidence of pneumatosis or
pneumoperitoneum.

## 2017-06-30 IMAGING — CT CT ANGIO CHEST
3 of 11 series · 17 of 46 positions shown · IV contrast (ISOVUE)
Comparison: CT of the abdomen and pelvis from 01/29/2014, and chest
radiograph performed 07/26/2015

CLINICAL DATA: Acute onset of severe bilateral rib pain. Recent
colonoscopy with polyp removal. Shortness of breath, nausea and
diaphoresis. Initial encounter.

EXAM:
CT ANGIOGRAPHY CHEST
CT ABDOMEN AND PELVIS WITH CONTRAST
TECHNIQUE: Multidetector CT imaging of the chest was performed using the
standard protocol during bolus administration of intravenous
contrast. Multiplanar CT image reconstructions and MIPs were
obtained to evaluate the vascular anatomy. Multidetector CT imaging
of the abdomen and pelvis was performed using the standard protocol
during bolus administration of intravenous contrast.
CONTRAST:  100 mL of Isovue 370 IV contrast

[Series 5: pe thins 1.5 · axial · 0.68mm/px · z∈[-264,-130]mm · 4 of 224 slices shown]
[im 38/224  lung]
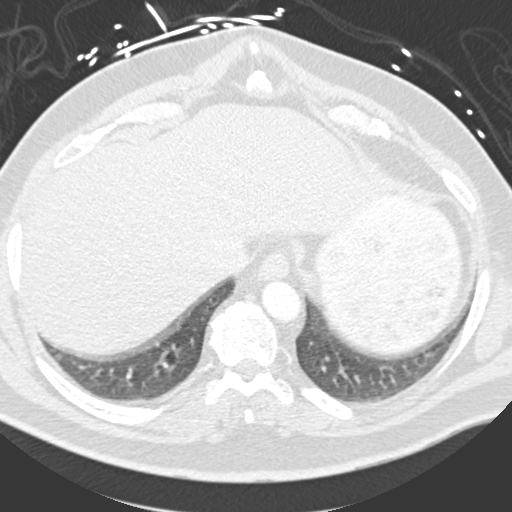
[im 75/224  lung]
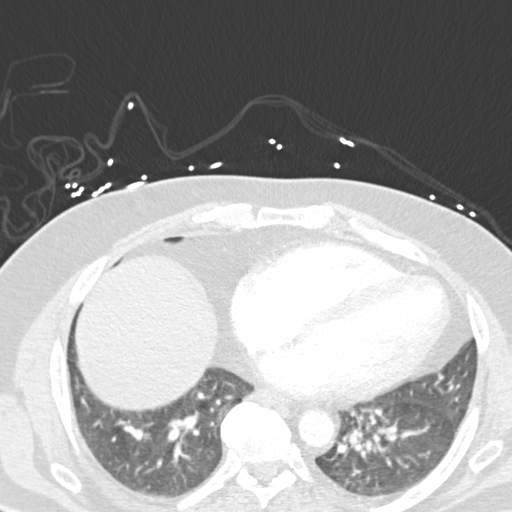
[im 112/224  lung]
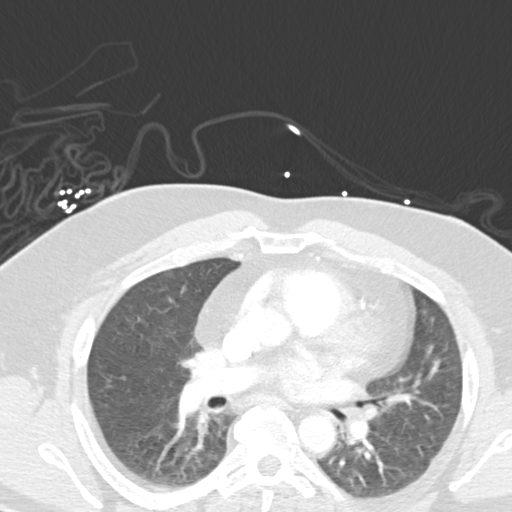
[im 149/224  lung]
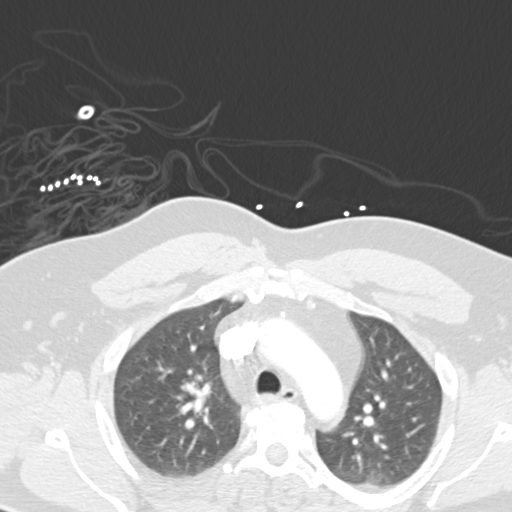

[Series 7: cor mpr 2.0 · coronal · 0.56mm/px · 1 of 119 slices shown]
[im 60/119  soft-tissue]
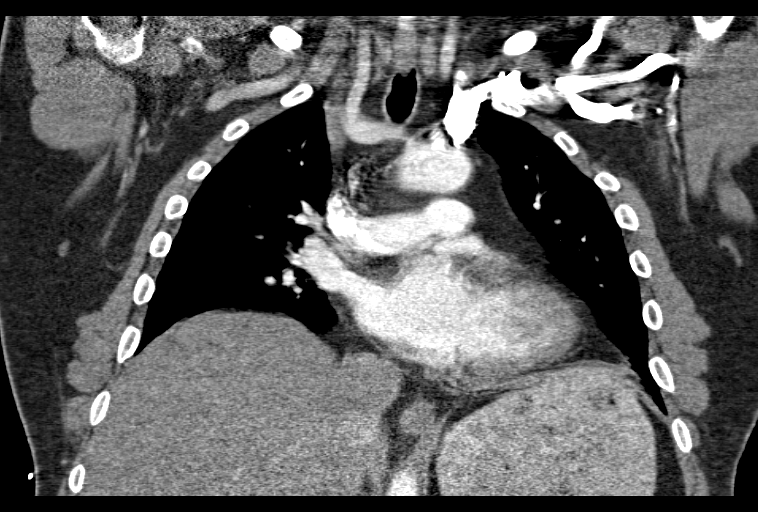

[Series 16: portal thins · axial · portal-venous · 0.89mm/px · z∈[-686,-226]mm · 12 of 453 slices shown]
[im 35/453  lung]
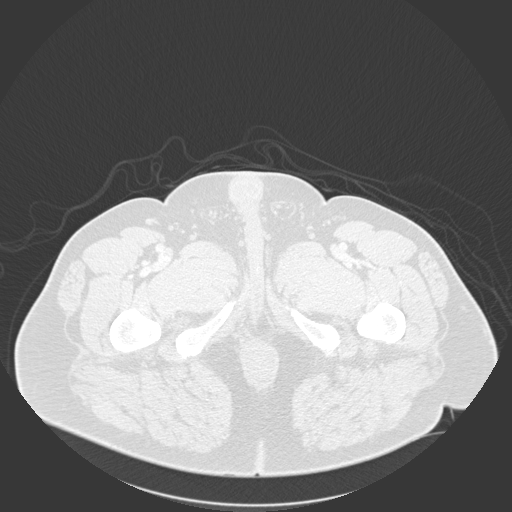
[im 70/453  soft-tissue]
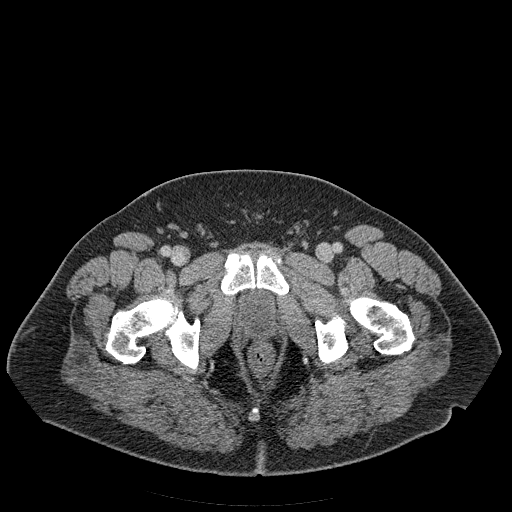
[im 105/453  lung]
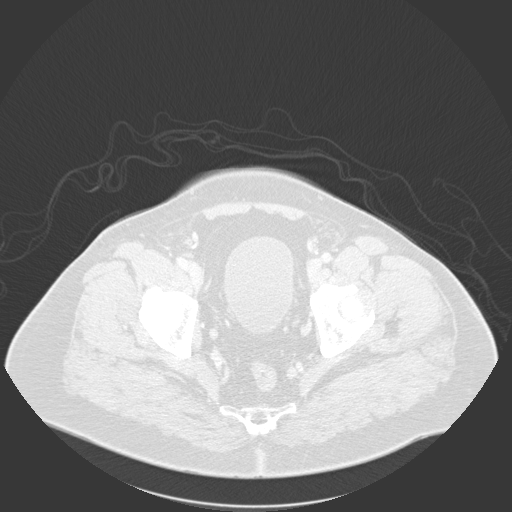
[im 140/453  soft-tissue]
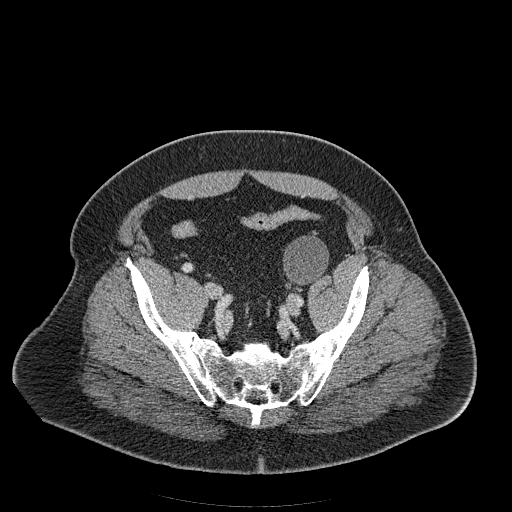
[im 174/453  lung]
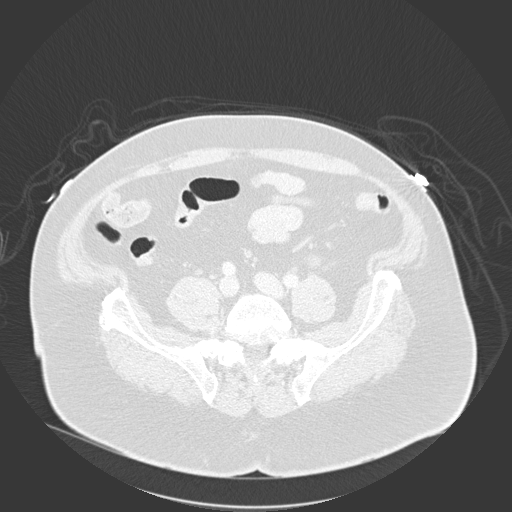
[im 209/453  soft-tissue]
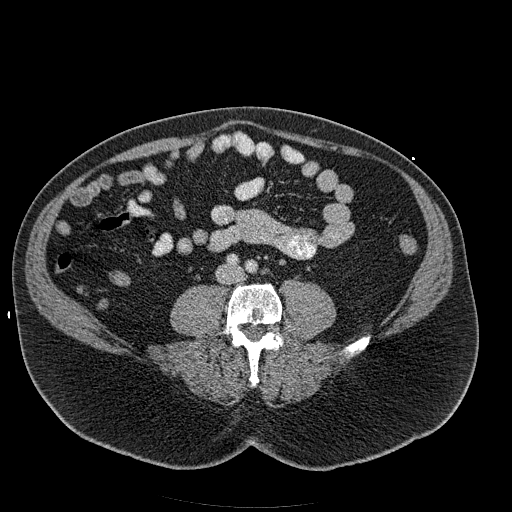
[im 244/453  lung]
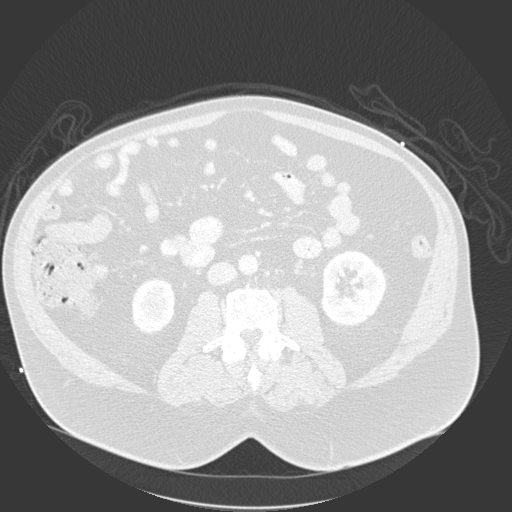
[im 279/453  soft-tissue]
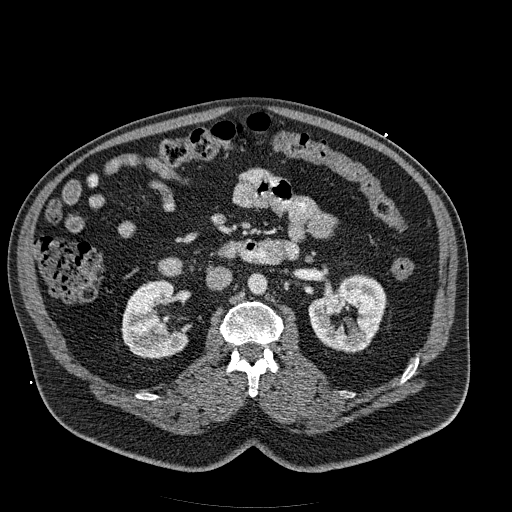
[im 313/453  lung]
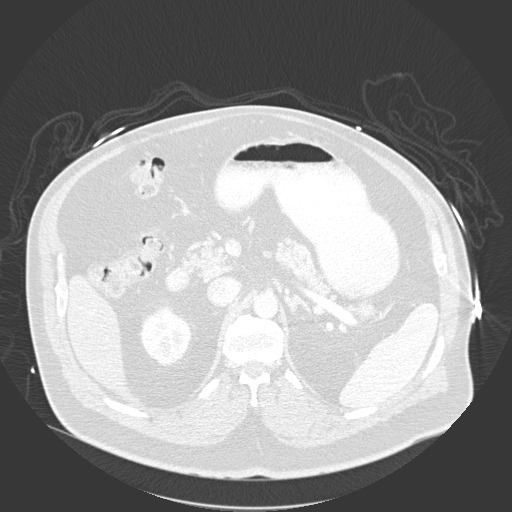
[im 348/453  soft-tissue]
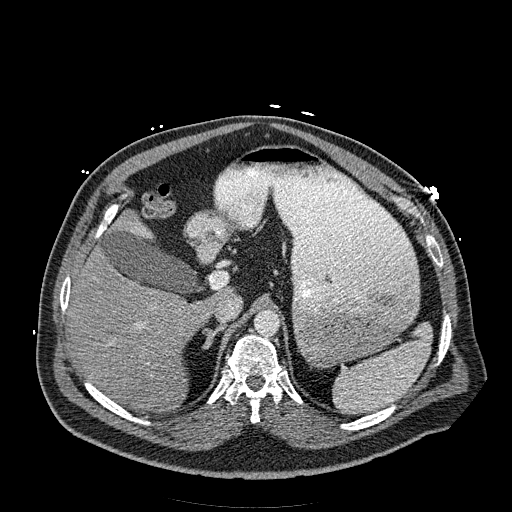
[im 383/453  lung]
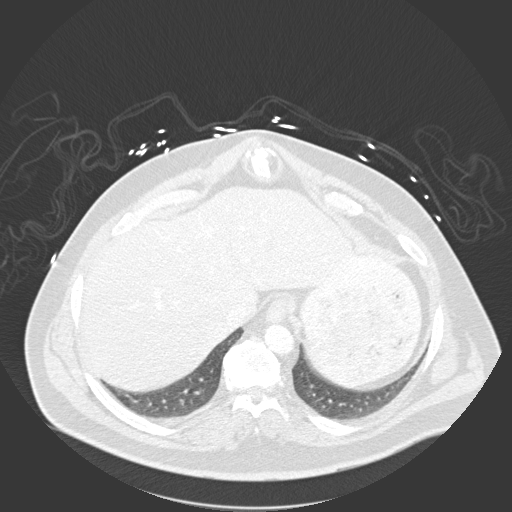
[im 418/453  soft-tissue]
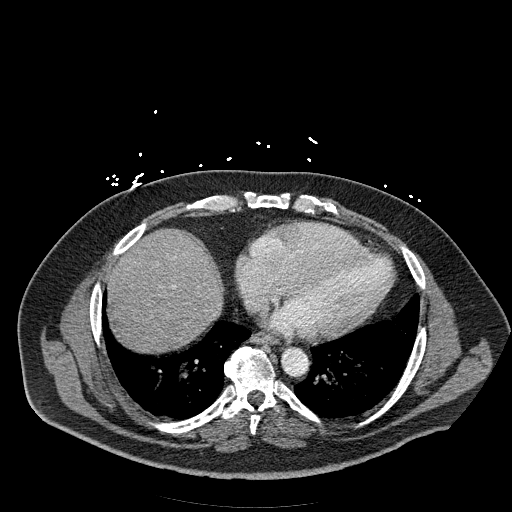

[17 of 46 positions shown; findings below may reference images not displayed]

FINDINGS: CTA CHEST FINDINGS

There is no evidence of pulmonary embolus.

Minimal bilateral atelectasis is noted. The lungs are otherwise
clear. There is no evidence of significant focal consolidation,
pleural effusion or pneumothorax. No masses are identified; no
abnormal focal contrast enhancement is seen.

Mild coronary artery calcification is noted. No mediastinal
lymphadenopathy is seen. No pericardial effusion is identified. The
great vessels are grossly unremarkable in appearance. No axillary
lymphadenopathy is seen. The visualized portions of the thyroid
gland are unremarkable in appearance.

No acute osseous abnormalities are seen.

CT ABDOMEN and PELVIS FINDINGS

The liver and spleen are unremarkable in appearance. The gallbladder
is within normal limits. The pancreas and adrenal glands are
unremarkable.

Scattered small bilateral renal cysts are seen. There is no evidence
of hydronephrosis. No renal or ureteral stones are seen. No
significant perinephric stranding is appreciated.

No free fluid is identified. The small bowel is unremarkable in
appearance. The stomach is within normal limits. No acute vascular
abnormalities are seen.

The appendix is normal in caliber, without evidence of appendicitis.
The colon is unremarkable in appearance.

The bladder is mildly distended and grossly unremarkable. A
nonspecific 7.7 cm cystic structure with peripheral calcification is
noted at the left upper hemipelvis. This is stable from 5162 and
likely benign. The patient is status post prostatectomy. No inguinal
lymphadenopathy is seen.

No acute osseous abnormalities are identified.

Review of the MIP images confirms the above findings.
IMPRESSION: 1. No evidence of pulmonary embolus.
2. Minimal bibasilar atelectasis noted.  Lungs otherwise clear.
3. Mild coronary artery calcifications seen.
4. Scattered small bilateral renal cysts seen.
5. Stable nonspecific 7.7 cm cystic structure with peripheral
calcification at the left upper hemipelvis is likely benign.

## 2017-07-02 ENCOUNTER — Encounter: Payer: BLUE CROSS/BLUE SHIELD | Admitting: Physical Medicine & Rehabilitation

## 2017-07-03 ENCOUNTER — Encounter: Payer: Self-pay | Admitting: Physical Medicine & Rehabilitation

## 2017-07-03 ENCOUNTER — Encounter
Payer: BLUE CROSS/BLUE SHIELD | Attending: Physical Medicine & Rehabilitation | Admitting: Physical Medicine & Rehabilitation

## 2017-07-03 ENCOUNTER — Other Ambulatory Visit: Payer: Self-pay

## 2017-07-03 VITALS — BP 123/78 | HR 59 | Ht 70.0 in | Wt 277.8 lb

## 2017-07-03 DIAGNOSIS — Z85828 Personal history of other malignant neoplasm of skin: Secondary | ICD-10-CM | POA: Diagnosis not present

## 2017-07-03 DIAGNOSIS — G479 Sleep disorder, unspecified: Secondary | ICD-10-CM

## 2017-07-03 DIAGNOSIS — T1490XA Injury, unspecified, initial encounter: Secondary | ICD-10-CM

## 2017-07-03 DIAGNOSIS — F39 Unspecified mood [affective] disorder: Secondary | ICD-10-CM | POA: Diagnosis not present

## 2017-07-03 DIAGNOSIS — F329 Major depressive disorder, single episode, unspecified: Secondary | ICD-10-CM | POA: Insufficient documentation

## 2017-07-03 DIAGNOSIS — Z6839 Body mass index (BMI) 39.0-39.9, adult: Secondary | ICD-10-CM | POA: Insufficient documentation

## 2017-07-03 DIAGNOSIS — G4700 Insomnia, unspecified: Secondary | ICD-10-CM | POA: Diagnosis not present

## 2017-07-03 DIAGNOSIS — Z8249 Family history of ischemic heart disease and other diseases of the circulatory system: Secondary | ICD-10-CM | POA: Insufficient documentation

## 2017-07-03 DIAGNOSIS — M109 Gout, unspecified: Secondary | ICD-10-CM | POA: Diagnosis not present

## 2017-07-03 DIAGNOSIS — R251 Tremor, unspecified: Secondary | ICD-10-CM | POA: Insufficient documentation

## 2017-07-03 DIAGNOSIS — K219 Gastro-esophageal reflux disease without esophagitis: Secondary | ICD-10-CM | POA: Insufficient documentation

## 2017-07-03 DIAGNOSIS — Z800 Family history of malignant neoplasm of digestive organs: Secondary | ICD-10-CM | POA: Insufficient documentation

## 2017-07-03 DIAGNOSIS — E7800 Pure hypercholesterolemia, unspecified: Secondary | ICD-10-CM | POA: Insufficient documentation

## 2017-07-03 DIAGNOSIS — M546 Pain in thoracic spine: Secondary | ICD-10-CM | POA: Diagnosis not present

## 2017-07-03 DIAGNOSIS — I10 Essential (primary) hypertension: Secondary | ICD-10-CM | POA: Diagnosis not present

## 2017-07-03 DIAGNOSIS — G8918 Other acute postprocedural pain: Secondary | ICD-10-CM

## 2017-07-03 DIAGNOSIS — K5900 Constipation, unspecified: Secondary | ICD-10-CM | POA: Diagnosis not present

## 2017-07-03 DIAGNOSIS — L905 Scar conditions and fibrosis of skin: Secondary | ICD-10-CM | POA: Insufficient documentation

## 2017-07-03 DIAGNOSIS — G894 Chronic pain syndrome: Secondary | ICD-10-CM | POA: Diagnosis not present

## 2017-07-03 DIAGNOSIS — S6291XD Unspecified fracture of right wrist and hand, subsequent encounter for fracture with routine healing: Secondary | ICD-10-CM | POA: Diagnosis present

## 2017-07-03 DIAGNOSIS — Z823 Family history of stroke: Secondary | ICD-10-CM | POA: Diagnosis not present

## 2017-07-03 DIAGNOSIS — R269 Unspecified abnormalities of gait and mobility: Secondary | ICD-10-CM | POA: Diagnosis not present

## 2017-07-03 DIAGNOSIS — M5416 Radiculopathy, lumbar region: Secondary | ICD-10-CM | POA: Diagnosis not present

## 2017-07-03 DIAGNOSIS — Z9079 Acquired absence of other genital organ(s): Secondary | ICD-10-CM | POA: Diagnosis not present

## 2017-07-03 DIAGNOSIS — Z8546 Personal history of malignant neoplasm of prostate: Secondary | ICD-10-CM | POA: Insufficient documentation

## 2017-07-03 MED ORDER — AMITRIPTYLINE HCL 25 MG PO TABS: 25.0000 mg | ORAL_TABLET | Freq: Every day | ORAL | 2 refills | Status: DC

## 2017-07-03 MED ORDER — TRAMADOL HCL 50 MG PO TABS: 50.0000 mg | ORAL_TABLET | Freq: Two times a day (BID) | ORAL | 2 refills | Status: DC | PRN
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
## 2017-08-11 ENCOUNTER — Ambulatory Visit: Payer: BLUE CROSS/BLUE SHIELD | Admitting: Neurology

## 2017-08-11 ENCOUNTER — Encounter

## 2017-08-11 ENCOUNTER — Encounter: Payer: Self-pay | Admitting: Neurology

## 2017-08-11 VITALS — BP 130/75 | HR 75 | Ht 70.0 in | Wt 274.0 lb

## 2017-08-11 DIAGNOSIS — M545 Low back pain, unspecified: Secondary | ICD-10-CM | POA: Insufficient documentation

## 2017-08-11 DIAGNOSIS — R251 Tremor, unspecified: Secondary | ICD-10-CM
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
## 2017-08-25 IMAGING — CR DG CHEST 1V PORT
1 series · 1 of 1 positions shown · non-contrast
Comparison: Portable exam 5738 hours compared to 07/26/2015

CLINICAL DATA: Leukocytosis, history hypertension, prostate cancer

EXAM:
PORTABLE CHEST 1 VIEW

[AP]
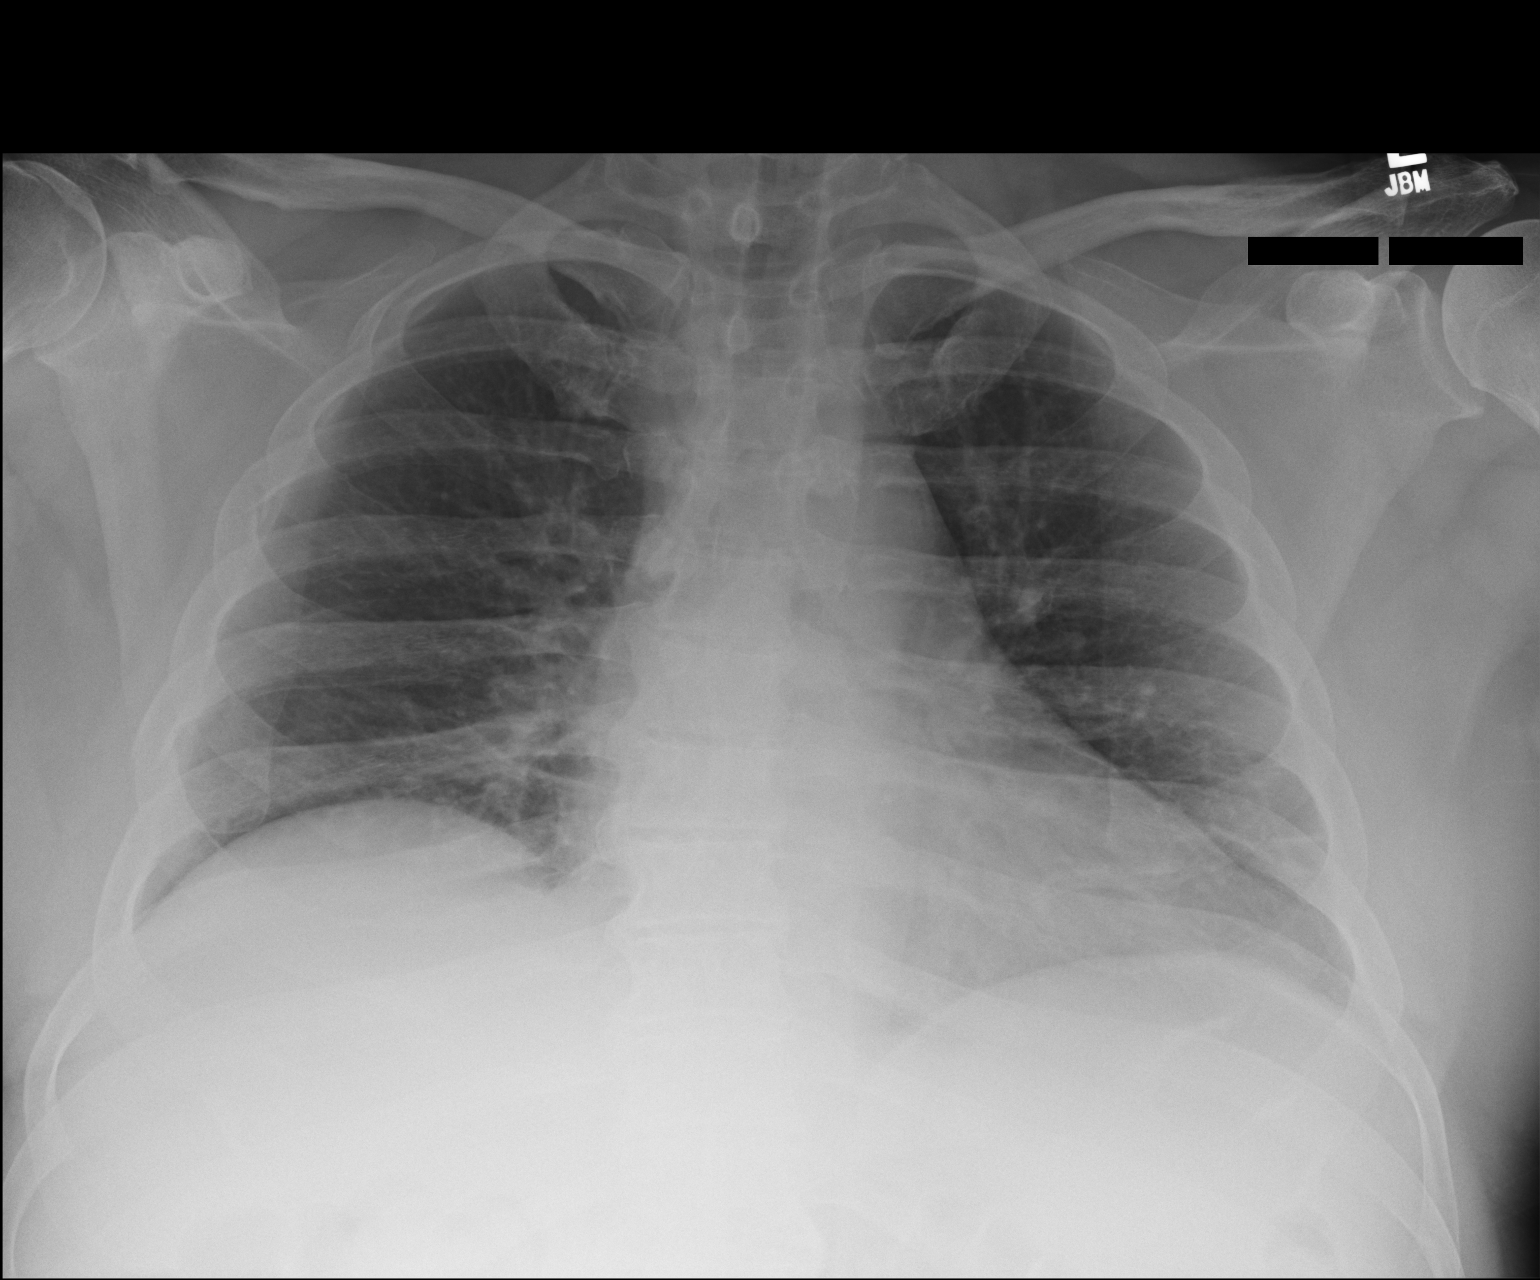

[1 of 1 positions shown; findings below may reference images not displayed]

FINDINGS: Normal heart size, mediastinal contours, and pulmonary vascularity.

Minimal bibasilar atelectasis.

Lungs otherwise clear.

No infiltrate, pleural effusion or pneumothorax.

Bones unremarkable.
IMPRESSION: Minimal bibasilar atelectasis without infiltrate.

## 2017-09-11 ENCOUNTER — Ambulatory Visit (INDEPENDENT_AMBULATORY_CARE_PROVIDER_SITE_OTHER): Payer: BLUE CROSS/BLUE SHIELD | Admitting: Neurology

## 2017-09-11 ENCOUNTER — Ambulatory Visit: Payer: BLUE CROSS/BLUE SHIELD | Admitting: Neurology

## 2017-09-11 DIAGNOSIS — R531 Weakness: Secondary | ICD-10-CM | POA: Diagnosis not present

## 2017-09-11 DIAGNOSIS — M545 Low back pain: Secondary | ICD-10-CM

## 2017-09-11 DIAGNOSIS — Z0289 Encounter for other administrative examinations: Secondary | ICD-10-CM

## 2017-09-11 DIAGNOSIS — M5440 Lumbago with sciatica, unspecified side: Secondary | ICD-10-CM

## 2017-09-11 DIAGNOSIS — R251 Tremor, unspecified: Secondary | ICD-10-CM
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
## 2017-09-11 NOTE — Progress Notes (Signed)
Emg/ncs is under procedure note

## 2017-09-17 ENCOUNTER — Telehealth: Payer: Self-pay | Admitting: *Deleted

## 2017-09-17 NOTE — Telephone Encounter (Signed)
The paperwork received is requesting his functional capacity.  This is type of exam is typically completed by physical therapy.  He does not require follow up with neurology.  Per Dr. [name], he will need to discuss this ppw with his PCP.  Paperwork returned to medical records.  His ppw fee will need to be refunded.

## 2017-09-17 NOTE — Telephone Encounter (Signed)
Pt Guardian form on [place].

## 2017-09-17 NOTE — Telephone Encounter (Signed)
I LVM unable to reach pt.

## 2017-09-18 ENCOUNTER — Telehealth: Payer: Self-pay

## 2017-09-18 NOTE — Telephone Encounter (Signed)
Notes on file.

## 2017-09-29 ENCOUNTER — Other Ambulatory Visit: Payer: Self-pay | Admitting: Physical Medicine & Rehabilitation

## 2017-10-02 ENCOUNTER — Encounter: Payer: Self-pay | Admitting: Physical Medicine & Rehabilitation

## 2017-10-02 ENCOUNTER — Encounter
Payer: BLUE CROSS/BLUE SHIELD | Attending: Physical Medicine & Rehabilitation | Admitting: Physical Medicine & Rehabilitation

## 2017-10-02 ENCOUNTER — Other Ambulatory Visit: Payer: Self-pay

## 2017-10-02 VITALS — BP 112/70 | HR 65 | Ht 70.0 in | Wt 277.8 lb

## 2017-10-02 DIAGNOSIS — G894 Chronic pain syndrome: Secondary | ICD-10-CM

## 2017-10-02 DIAGNOSIS — Z85828 Personal history of other malignant neoplasm of skin: Secondary | ICD-10-CM | POA: Insufficient documentation

## 2017-10-02 DIAGNOSIS — E7800 Pure hypercholesterolemia, unspecified: Secondary | ICD-10-CM | POA: Insufficient documentation

## 2017-10-02 DIAGNOSIS — M109 Gout, unspecified: Secondary | ICD-10-CM | POA: Insufficient documentation

## 2017-10-02 DIAGNOSIS — G8918 Other acute postprocedural pain: Secondary | ICD-10-CM

## 2017-10-02 DIAGNOSIS — K219 Gastro-esophageal reflux disease without esophagitis: Secondary | ICD-10-CM | POA: Diagnosis present

## 2017-10-02 DIAGNOSIS — M546 Pain in thoracic spine: Secondary | ICD-10-CM

## 2017-10-02 DIAGNOSIS — Z5181 Encounter for therapeutic drug level monitoring: Secondary | ICD-10-CM

## 2017-10-02 DIAGNOSIS — M1990 Unspecified osteoarthritis, unspecified site: Secondary | ICD-10-CM | POA: Diagnosis not present

## 2017-10-02 DIAGNOSIS — G4700 Insomnia, unspecified: Secondary | ICD-10-CM | POA: Insufficient documentation

## 2017-10-02 DIAGNOSIS — M791 Myalgia, unspecified site: Secondary | ICD-10-CM | POA: Insufficient documentation

## 2017-10-02 DIAGNOSIS — Z8546 Personal history of malignant neoplasm of prostate: Secondary | ICD-10-CM | POA: Diagnosis not present

## 2017-10-02 DIAGNOSIS — R269 Unspecified abnormalities of gait and mobility: Secondary | ICD-10-CM | POA: Diagnosis not present

## 2017-10-02 DIAGNOSIS — K5900 Constipation, unspecified: Secondary | ICD-10-CM | POA: Insufficient documentation

## 2017-10-02 DIAGNOSIS — Z79891 Long term (current) use of opiate analgesic: Secondary | ICD-10-CM | POA: Diagnosis not present

## 2017-10-02 DIAGNOSIS — F329 Major depressive disorder, single episode, unspecified: Secondary | ICD-10-CM | POA: Insufficient documentation

## 2017-10-02 DIAGNOSIS — S01311A Laceration without foreign body of right ear, initial encounter: Secondary | ICD-10-CM | POA: Diagnosis not present

## 2017-10-02 DIAGNOSIS — R251 Tremor, unspecified: Secondary | ICD-10-CM | POA: Diagnosis not present

## 2017-10-02 DIAGNOSIS — S6291XD Unspecified fracture of right wrist and hand, subsequent encounter for fracture with routine healing: Secondary | ICD-10-CM | POA: Insufficient documentation

## 2017-10-02 DIAGNOSIS — M5416 Radiculopathy, lumbar region: Secondary | ICD-10-CM

## 2017-10-02 DIAGNOSIS — G479 Sleep disorder, unspecified: Secondary | ICD-10-CM

## 2017-10-02 DIAGNOSIS — I10 Essential (primary) hypertension: Secondary | ICD-10-CM | POA: Diagnosis present

## 2017-10-02 DIAGNOSIS — Z9079 Acquired absence of other genital organ(s): Secondary | ICD-10-CM | POA: Insufficient documentation

## 2017-10-02 MED ORDER — TRAMADOL HCL 50 MG PO TABS: 50.0000 mg | ORAL_TABLET | Freq: Two times a day (BID) | ORAL | 2 refills | Status: DC | PRN
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
## 2017-10-08 ENCOUNTER — Encounter: Payer: Self-pay | Admitting: Cardiology

## 2017-10-09 ENCOUNTER — Telehealth: Payer: Self-pay | Admitting: *Deleted

## 2017-10-09 LAB — TOXASSURE SELECT,+ANTIDEPR,UR

## 2017-10-09 NOTE — Telephone Encounter (Signed)
Urine drug screen for this encounter is consistent for prescribed medication 

## 2017-10-20 ENCOUNTER — Encounter: Payer: Self-pay | Admitting: Cardiology

## 2017-10-20 ENCOUNTER — Ambulatory Visit: Payer: BLUE CROSS/BLUE SHIELD | Admitting: Cardiology

## 2017-10-20 VITALS — BP 110/62 | HR 64 | Ht 70.0 in | Wt 274.8 lb

## 2017-10-20 DIAGNOSIS — R0789 Other chest pain: Secondary | ICD-10-CM | POA: Diagnosis not present
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
## 2017-10-20 IMAGING — CR DG CHEST 2V
2 series · 2 of 2 positions shown · non-contrast
Comparison: Chest radiograph 09/21/2015.

CLINICAL DATA: Patient with new onset right left-sided chest pain.
Cough.

EXAM:
CHEST  2 VIEW

[w chest pa]
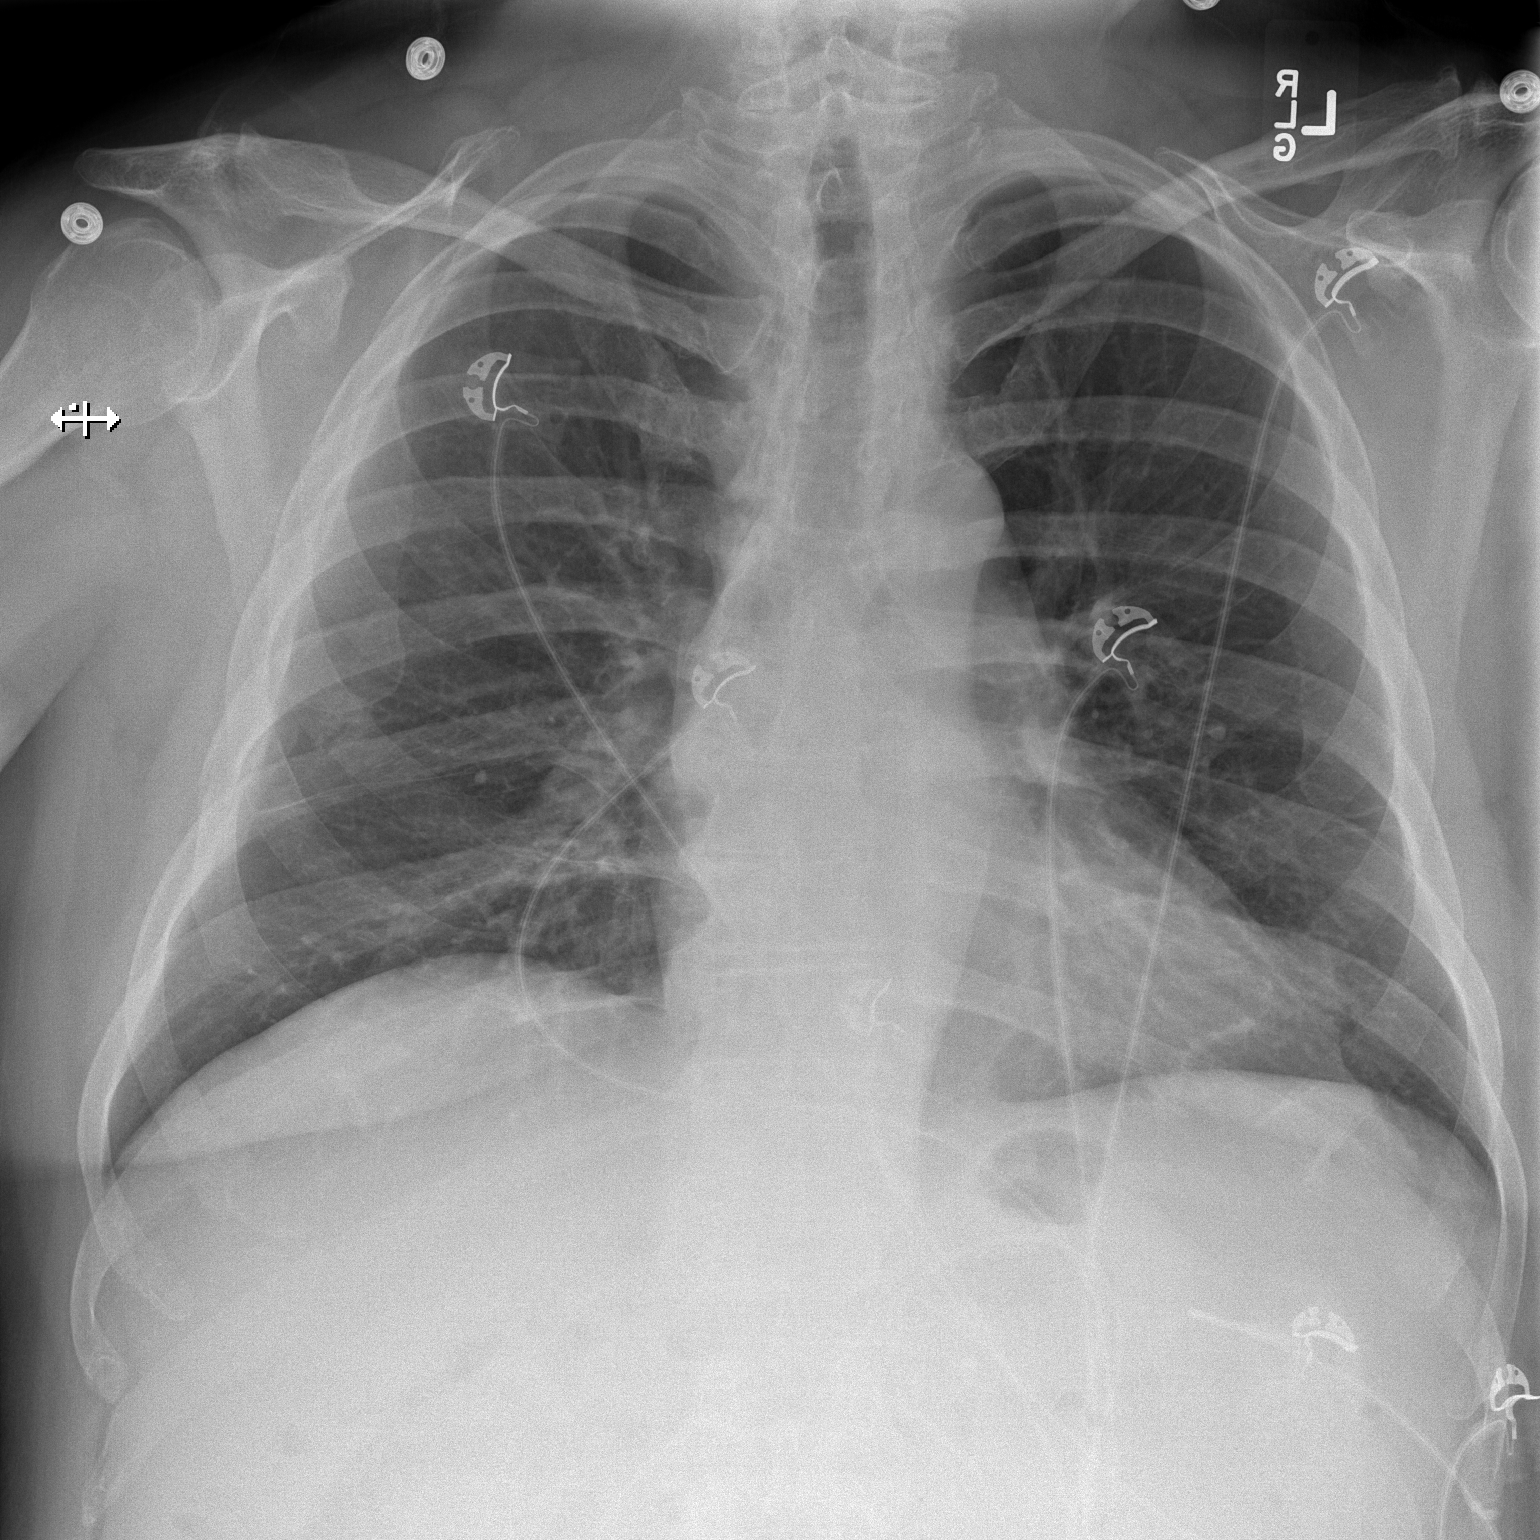

[w chest lat]
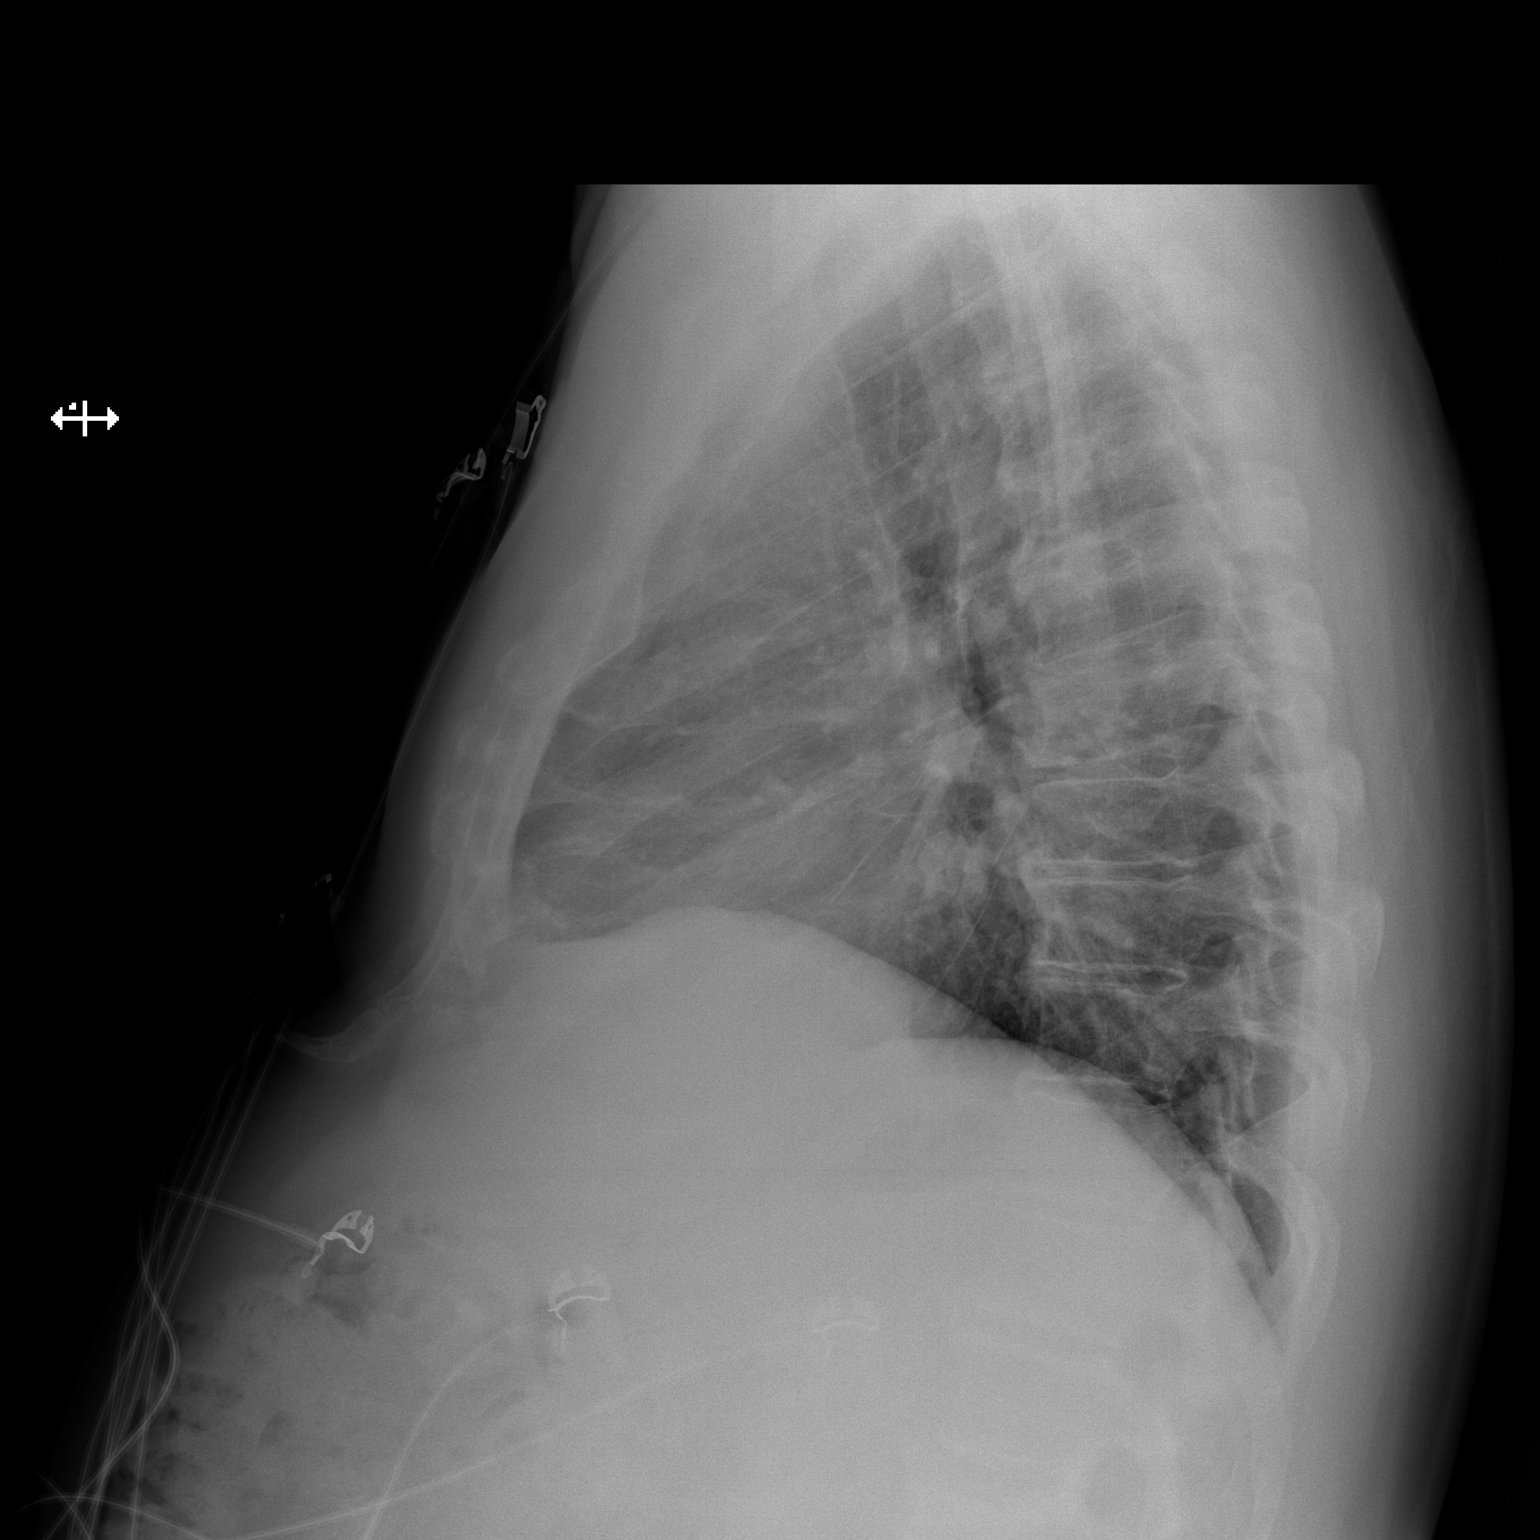

[2 of 2 positions shown; findings below may reference images not displayed]

FINDINGS: Multiple monitoring leads overlie the patient. Normal cardiac and
mediastinal contours. No consolidative pulmonary opacities. No
pleural effusion or pneumothorax. Mid thoracic spine degenerative
changes.
IMPRESSION: No active cardiopulmonary disease.
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
## 2017-10-20 NOTE — Patient Instructions (Signed)
Your physician recommends that you continue on your current medications as directed. Please refer to the Current Medication list given to you today.  Your physician recommends that you schedule a follow-up appointment as needed. Continue routine follow ups with your primary care physician.    American Heart Association (AHA) Exercise Recommendation  Being physically active is important to prevent heart disease and stroke, the nation's No. 1and No. 5killers. To improve overall cardiovascular health, we suggest at least 150 minutes per week of moderate exercise or 75 minutes per week of vigorous exercise (or a combination of moderate and vigorous activity). Thirty minutes a day, five times a week is an easy goal to remember. You will also experience benefits even if you divide your time into two or three segments of 10 to 15 minutes per day.  For people who would benefit from lowering their blood pressure or cholesterol, we recommend 40 minutes of aerobic exercise of moderate to vigorous intensity three to four times a week to lower the risk for heart attack and stroke.  Physical activity is anything that makes you move your body and burn calories.  This includes things like climbing stairs or playing sports. Aerobic exercises benefit your heart, and include walking, jogging, swimming or biking. Strength and stretching exercises are best for overall stamina and flexibility.  The simplest, positive change you can make to effectively improve your heart health is to start walking. It's enjoyable, free, easy, social and great exercise. A walking program is flexible and boasts high success rates because people can stick with it. It's easy for walking to become a regular and satisfying part of life.   For Overall Cardiovascular Health:  At least 30 minutes of moderate-intensity aerobic activity at least 5 days per week for a total of 150  OR   At least 25 minutes of vigorous aerobic activity at  least 3 days per week for a total of 75 minutes; or a combination of moderate- and vigorous-intensity aerobic activity  AND   Moderate- to high-intensity muscle-strengthening activity at least 2 days per week for additional health benefits.  For Lowering Blood Pressure and Cholesterol  An average 40 minutes of moderate- to vigorous-intensity aerobic activity 3 or 4 times per week  What if I can't make it to the time goal? Something is always better than nothing! And everyone has to start somewhere. Even if you've been sedentary for years, today is the day you can begin to make healthy changes in your life. If you don't think you'll make it for 30 or 40 minutes, set a reachable goal for today. You can work up toward your overall goal by increasing your time as you get stronger. Don't let all-or-nothing thinking rob you of doing what you can every day.  Source:http://www.heart.org

## 2017-10-21 ENCOUNTER — Ambulatory Visit: Payer: Self-pay | Admitting: Family Medicine

## 2017-10-23 ENCOUNTER — Encounter: Payer: Self-pay | Admitting: Family Medicine

## 2017-11-02 ENCOUNTER — Encounter: Payer: Self-pay | Admitting: Family Medicine

## 2017-11-02 ENCOUNTER — Ambulatory Visit: Payer: BLUE CROSS/BLUE SHIELD | Admitting: Family Medicine

## 2017-11-02 VITALS — BP 106/70 | HR 68 | Temp 98.9°F | Ht 70.0 in | Wt 276.0 lb

## 2017-11-02 DIAGNOSIS — F419 Anxiety disorder, unspecified: Secondary | ICD-10-CM

## 2017-11-02 DIAGNOSIS — I10 Essential (primary) hypertension: Secondary | ICD-10-CM

## 2017-11-02 DIAGNOSIS — Z7689 Persons encountering health services in other specified circumstances: Secondary | ICD-10-CM

## 2017-11-02 DIAGNOSIS — E7800 Pure hypercholesterolemia, unspecified: Secondary | ICD-10-CM | POA: Diagnosis not present

## 2017-11-02 DIAGNOSIS — M545 Low back pain, unspecified: Secondary | ICD-10-CM

## 2017-11-02 DIAGNOSIS — R7303 Prediabetes: Secondary | ICD-10-CM

## 2017-11-02 DIAGNOSIS — Z8546 Personal history of malignant neoplasm of prostate: Secondary | ICD-10-CM

## 2017-11-02 NOTE — Patient Instructions (Addendum)
Managing Your Hypertension Hypertension is commonly called high blood pressure. This is when the force of your blood pressing against the walls of your arteries is too strong. Arteries are blood vessels that carry blood from your heart throughout your body. Hypertension forces the heart to work harder to pump blood, and may cause the arteries to become narrow or stiff. Having untreated or uncontrolled hypertension can cause heart attack, stroke, kidney disease, and other problems. What are blood pressure readings? A blood pressure reading consists of a higher number over a lower number. Ideally, your blood pressure should be below 120/80. The first ("top") number is called the systolic pressure. It is a measure of the pressure in your arteries as your heart beats. The second ("bottom") number is called the diastolic pressure. It is a measure of the pressure in your arteries as the heart relaxes. What does my blood pressure reading mean? Blood pressure is classified into four stages. Based on your blood pressure reading, your health care provider may use the following stages to determine what type of treatment you need, if any. Systolic pressure and diastolic pressure are measured in a unit called mm Hg. Normal  Systolic pressure: below 120.  Diastolic pressure: below 80. Elevated  Systolic pressure: 120-129.  Diastolic pressure: below 80. Hypertension stage 1  Systolic pressure: 130-139.  Diastolic pressure: 80-89. Hypertension stage 2  Systolic pressure: 140 or above.  Diastolic pressure: 90 or above. What health risks are associated with hypertension? Managing your hypertension is an important responsibility. Uncontrolled hypertension can lead to:  A heart attack.  A stroke.  A weakened blood vessel (aneurysm).  Heart failure.  Kidney damage.  Eye damage.  Metabolic syndrome.  Memory and concentration problems.  What changes can I make to manage my  hypertension? Hypertension can be managed by making lifestyle changes and possibly by taking medicines. Your health care provider will help you make a plan to bring your blood pressure within a normal range. Eating and drinking  Eat a diet that is high in fiber and potassium, and low in salt (sodium), added sugar, and fat. An example eating plan is called the DASH (Dietary Approaches to Stop Hypertension) diet. To eat this way: ? Eat plenty of fresh fruits and vegetables. Try to fill half of your plate at each meal with fruits and vegetables. ? Eat whole grains, such as whole wheat pasta, brown rice, or whole grain bread. Fill about one quarter of your plate with whole grains. ? Eat low-fat diary products. ? Avoid fatty cuts of meat, processed or cured meats, and poultry with skin. Fill about one quarter of your plate with lean proteins such as fish, chicken without skin, beans, eggs, and tofu. ? Avoid premade and processed foods. These tend to be higher in sodium, added sugar, and fat.  Reduce your daily sodium intake. Most people with hypertension should eat less than 1,500 mg of sodium a day.  Limit alcohol intake to no more than 1 drink a day for nonpregnant women and 2 drinks a day for men. One drink equals 12 oz of beer, 5 oz of wine, or 1 oz of hard liquor. Lifestyle  Work with your health care provider to maintain a healthy body weight, or to lose weight. Ask what an ideal weight is for you.  Get at least 30 minutes of exercise that causes your heart to beat faster (aerobic exercise) most days of the week. Activities may include walking, swimming, or biking.  Include exercise   to strengthen your muscles (resistance exercise), such as weight lifting, as part of your weekly exercise routine. Try to do these types of exercises for 30 minutes at least 3 days a week.  Do not use any products that contain nicotine or tobacco, such as cigarettes and e-cigarettes. If you need help quitting, ask  your health care provider.  Control any long-term (chronic) conditions you have, such as high cholesterol or diabetes. Monitoring  Monitor your blood pressure at home as told by your health care provider. Your personal target blood pressure may vary depending on your medical conditions, your age, and other factors.  Have your blood pressure checked regularly, as often as told by your health care provider. Working with your health care provider  Review all the medicines you take with your health care provider because there may be side effects or interactions.  Talk with your health care provider about your diet, exercise habits, and other lifestyle factors that may be contributing to hypertension.  Visit your health care provider regularly. Your health care provider can help you create and adjust your plan for managing hypertension. Will I need medicine to control my blood pressure? Your health care provider may prescribe medicine if lifestyle changes are not enough to get your blood pressure under control, and if:  Your systolic blood pressure is 130 or higher.  Your diastolic blood pressure is 80 or higher.  Take medicines only as told by your health care provider. Follow the directions carefully. Blood pressure medicines must be taken as prescribed. The medicine does not work as well when you skip doses. Skipping doses also puts you at risk for problems. Contact a health care provider if:  You think you are having a reaction to medicines you have taken.  You have repeated (recurrent) headaches.  You feel dizzy.  You have swelling in your ankles.  You have trouble with your vision. Get help right away if:  You develop a severe headache or confusion.  You have unusual weakness or numbness, or you feel faint.  You have severe pain in your chest or abdomen.  You vomit repeatedly.  You have trouble breathing. Summary  Hypertension is when the force of blood pumping through  your arteries is too strong. If this condition is not controlled, it may put you at risk for serious complications.  Your personal target blood pressure may vary depending on your medical conditions, your age, and other factors. For most people, a normal blood pressure is less than 120/80.  Hypertension is managed by lifestyle changes, medicines, or both. Lifestyle changes include weight loss, eating a healthy, low-sodium diet, exercising more, and limiting alcohol. This information is not intended to replace advice given to you by your health care provider. Make sure you discuss any questions you have with your health care provider. Document Released: 10/01/2011 Document Revised: 12/05/2015 Document Reviewed: 12/05/2015 Elsevier Interactive Patient Education  2018 [employer].  Chronic Back Pain When back pain lasts longer than 3 months, it is called chronic back pain.The cause of your back pain may not be known. Some common causes include:  Wear and tear (degenerative disease) of the bones, ligaments, or disks in your back.  Inflammation and stiffness in your back (arthritis).  People who have chronic back pain often go through certain periods in which the pain is more intense (flare-ups). Many people can learn to manage the pain with home care. Follow these instructions at home: Pay attention to any changes in your symptoms.  Take these actions to help with your pain: Activity  Avoid bending and activities that make the problem worse.  Do not sit or stand in one place for long periods of time.  Take brief periods of rest throughout the day. This will reduce your pain. Resting in a lying or standing position is usually better than sitting to rest.  When you are resting for longer periods, mix in some mild activity or stretching between periods of rest. This will help to prevent stiffness and pain.  Get regular exercise. Ask your health care provider what activities are safe for  you.  Do not lift anything that is heavier than 10 lb (4.5 kg). Always use proper lifting technique, which includes: ? Bending your knees. ? Keeping the load close to your body. ? Avoiding twisting. Managing pain  If directed, apply ice to the painful area. Your health care provider may recommend applying ice during the first 24-48 hours after a flare-up begins. ? Put ice in a plastic bag. ? Place a towel between your skin and the bag. ? Leave the ice on for 20 minutes, 2-3 times per day.  After icing, apply heat to the affected area as often as told by your health care provider. Use the heat source that your health care provider recommends, such as a moist heat pack or a heating pad. ? Place a towel between your skin and the heat source. ? Leave the heat on for 20-30 minutes. ? Remove the heat if your skin turns bright red. This is especially important if you are unable to feel pain, heat, or cold. You may have a greater risk of getting burned.  Try soaking in a warm tub.  Take over-the-counter and prescription medicines only as told by your health care provider.  Keep all follow-up visits as told by your health care provider. This is important. Contact a health care provider if:  You have pain that is not relieved with rest or medicine. Get help right away if:  You have weakness or numbness in one or both of your legs or feet.  You have trouble controlling your bladder or your bowels.  You have nausea or vomiting.  You have pain in your abdomen.  You have shortness of breath or you faint. This information is not intended to replace advice given to you by your health care provider. Make sure you discuss any questions you have with your health care provider. Document Released: 02/14/2004 Document Revised: 05/17/2015 Document Reviewed: 06/26/2014 Elsevier Interactive Patient Education  2018 [employer].
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
## 2017-11-03 ENCOUNTER — Encounter: Payer: Self-pay | Admitting: Family Medicine

## 2017-11-03 DIAGNOSIS — Z8546 Personal history of malignant neoplasm of prostate: Secondary | ICD-10-CM | POA: Insufficient documentation

## 2017-11-03 DIAGNOSIS — R7303 Prediabetes: Secondary | ICD-10-CM | POA: Insufficient documentation

## 2017-12-03 ENCOUNTER — Emergency Department (HOSPITAL_COMMUNITY): Payer: BLUE CROSS/BLUE SHIELD

## 2017-12-03 ENCOUNTER — Observation Stay (HOSPITAL_COMMUNITY)
Admission: EM | Admit: 2017-12-03 | Discharge: 2017-12-05 | Disposition: A | Discharge status: Home / Self Care | Payer: BLUE CROSS/BLUE SHIELD | Attending: General Surgery | Admitting: General Surgery

## 2017-12-03 ENCOUNTER — Encounter (HOSPITAL_COMMUNITY): Payer: Self-pay

## 2017-12-03 ENCOUNTER — Other Ambulatory Visit: Payer: Self-pay

## 2017-12-03 DIAGNOSIS — M109 Gout, unspecified: Secondary | ICD-10-CM | POA: Insufficient documentation

## 2017-12-03 DIAGNOSIS — Z8546 Personal history of malignant neoplasm of prostate: Secondary | ICD-10-CM | POA: Insufficient documentation

## 2017-12-03 DIAGNOSIS — R1032 Left lower quadrant pain: Secondary | ICD-10-CM | POA: Diagnosis present

## 2017-12-03 DIAGNOSIS — F329 Major depressive disorder, single episode, unspecified: Secondary | ICD-10-CM | POA: Insufficient documentation

## 2017-12-03 DIAGNOSIS — E7800 Pure hypercholesterolemia, unspecified: Secondary | ICD-10-CM | POA: Diagnosis not present

## 2017-12-03 DIAGNOSIS — M1711 Unilateral primary osteoarthritis, right knee: Secondary | ICD-10-CM | POA: Insufficient documentation

## 2017-12-03 DIAGNOSIS — Z79899 Other long term (current) drug therapy: Secondary | ICD-10-CM | POA: Insufficient documentation

## 2017-12-03 DIAGNOSIS — K3580 Unspecified acute appendicitis: Secondary | ICD-10-CM | POA: Diagnosis not present

## 2017-12-03 DIAGNOSIS — K219 Gastro-esophageal reflux disease without esophagitis: Secondary | ICD-10-CM | POA: Diagnosis not present

## 2017-12-03 DIAGNOSIS — Z6839 Body mass index (BMI) 39.0-39.9, adult: Secondary | ICD-10-CM | POA: Insufficient documentation

## 2017-12-03 DIAGNOSIS — Z85828 Personal history of other malignant neoplasm of skin: Secondary | ICD-10-CM | POA: Diagnosis not present

## 2017-12-03 DIAGNOSIS — I10 Essential (primary) hypertension: Secondary | ICD-10-CM | POA: Insufficient documentation

## 2017-12-03 LAB — URINALYSIS, ROUTINE W REFLEX MICROSCOPIC
BILIRUBIN URINE: NEGATIVE
Bacteria, UA: NONE SEEN
GLUCOSE, UA: NEGATIVE mg/dL
Ketones, ur: NEGATIVE mg/dL
LEUKOCYTES UA: NEGATIVE
NITRITE: NEGATIVE
PH: 6 (ref 5.0–8.0)
PROTEIN: NEGATIVE mg/dL
Specific Gravity, Urine: 1.015 (ref 1.005–1.030)

## 2017-12-03 MED ORDER — ONDANSETRON HCL 4 MG/2ML IJ SOLN
4.0000 mg | Freq: Once | INTRAMUSCULAR | Status: AC
  Administered 2017-12-04: 4 mg via INTRAVENOUS
  Filled 2017-12-03: qty 2

## 2017-12-03 MED ORDER — HYDROMORPHONE HCL 1 MG/ML IJ SOLN
1.0000 mg | Freq: Once | INTRAMUSCULAR | Status: AC
  Administered 2017-12-04: 1 mg via INTRAVENOUS
  Filled 2017-12-03: qty 1

## 2017-12-03 NOTE — ED Notes (Signed)
Patient went to ct

## 2017-12-03 NOTE — ED Triage Notes (Signed)
PT to ED With c/o of LLQ pain that is sharp and "gut ranching" that started tonight at 1730. Pt states hx of kidney stones and pain feels similar to last kidney stone.  PT states mid nausea. Pt is A&O x4 and ambulatory.

## 2017-12-03 NOTE — ED Notes (Signed)
Pt. Made aware for the need of urine specimen. 
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
## 2017-12-04 ENCOUNTER — Encounter (HOSPITAL_COMMUNITY): Admission: EM | Disposition: A | Discharge status: Home / Self Care | Payer: Self-pay | Source: Home / Self Care

## 2017-12-04 ENCOUNTER — Encounter (HOSPITAL_COMMUNITY): Payer: Self-pay | Admitting: Registered Nurse

## 2017-12-04 ENCOUNTER — Observation Stay (HOSPITAL_COMMUNITY): Payer: BLUE CROSS/BLUE SHIELD | Admitting: Registered Nurse

## 2017-12-04 DIAGNOSIS — K3580 Unspecified acute appendicitis: Secondary | ICD-10-CM | POA: Diagnosis present

## 2017-12-04 HISTORY — PX: LAPAROSCOPIC APPENDECTOMY: SHX408

## 2017-12-04 LAB — CBC WITH DIFFERENTIAL/PLATELET
Abs Immature Granulocytes: 0.03 10*3/uL (ref 0.00–0.07)
BASOS ABS: 0 10*3/uL (ref 0.0–0.1)
Basophils Relative: 0 %
EOS PCT: 2 %
Eosinophils Absolute: 0.2 10*3/uL (ref 0.0–0.5)
HEMATOCRIT: 38.9 % — AB (ref 39.0–52.0)
HEMOGLOBIN: 12.5 g/dL — AB (ref 13.0–17.0)
IMMATURE GRANULOCYTES: 0 %
LYMPHS ABS: 1.9 10*3/uL (ref 0.7–4.0)
LYMPHS PCT: 21 %
MCH: 28.8 pg (ref 26.0–34.0)
MCHC: 32.1 g/dL (ref 30.0–36.0)
MCV: 89.6 fL (ref 80.0–100.0)
Monocytes Absolute: 0.7 10*3/uL (ref 0.1–1.0)
Monocytes Relative: 7 %
NEUTROS PCT: 70 %
NRBC: 0 % (ref 0.0–0.2)
Neutro Abs: 6.4 10*3/uL (ref 1.7–7.7)
Platelets: 212 10*3/uL (ref 150–400)
RBC: 4.34 MIL/uL (ref 4.22–5.81)
RDW: 12.5 % (ref 11.5–15.5)
WBC: 9.2 10*3/uL (ref 4.0–10.5)

## 2017-12-04 LAB — BASIC METABOLIC PANEL
ANION GAP: 10 (ref 5–15)
BUN: 14 mg/dL (ref 6–20)
CHLORIDE: 104 mmol/L (ref 98–111)
CO2: 22 mmol/L (ref 22–32)
Calcium: 8.8 mg/dL — ABNORMAL LOW (ref 8.9–10.3)
Creatinine, Ser: 0.93 mg/dL (ref 0.61–1.24)
GFR calc non Af Amer: >60 mL/min (ref >60)
Glucose, Bld: 190 mg/dL — ABNORMAL HIGH (ref 70–99)
Potassium: 4.2 mmol/L (ref 3.5–5.1)
Sodium: 136 mmol/L (ref 135–145)

## 2017-12-04 LAB — GLUCOSE, CAPILLARY
Glucose-Capillary: 102 mg/dL — ABNORMAL HIGH (ref 70–99)
Glucose-Capillary: 130 mg/dL — ABNORMAL HIGH (ref 70–99)

## 2017-12-04 LAB — SURGICAL PCR SCREEN
MRSA, PCR: POSITIVE — AB
Staphylococcus aureus: POSITIVE — AB

## 2017-12-04 SURGERY — APPENDECTOMY, LAPAROSCOPIC
Anesthesia: General

## 2017-12-04 MED ORDER — LIDOCAINE 2% (20 MG/ML) 5 ML SYRINGE
INTRAMUSCULAR | Status: DC | PRN
  Administered 2017-12-04: 100 mg via INTRAVENOUS

## 2017-12-04 MED ORDER — HYDROMORPHONE HCL 1 MG/ML IJ SOLN
INTRAMUSCULAR | Status: AC
  Filled 2017-12-04: qty 1

## 2017-12-04 MED ORDER — OXYCODONE HCL 5 MG/5ML PO SOLN
5.0000 mg | Freq: Once | ORAL | Status: DC | PRN
  Filled 2017-12-04: qty 5

## 2017-12-04 MED ORDER — SODIUM CHLORIDE 0.9 % IV SOLN
2.0000 g | Freq: Once | INTRAVENOUS | Status: AC
  Administered 2017-12-04: 2 g via INTRAVENOUS
  Filled 2017-12-04: qty 20

## 2017-12-04 MED ORDER — MEPERIDINE HCL 50 MG/ML IJ SOLN: 6.2500 mg | INTRAMUSCULAR | Status: DC | PRN

## 2017-12-04 MED ORDER — PROPOFOL 10 MG/ML IV BOLUS
INTRAVENOUS | Status: AC
  Filled 2017-12-04: qty 20

## 2017-12-04 MED ORDER — ONDANSETRON HCL 4 MG/2ML IJ SOLN
INTRAMUSCULAR | Status: AC
  Filled 2017-12-04: qty 2

## 2017-12-04 MED ORDER — SODIUM CHLORIDE 0.9 % IV SOLN
INTRAVENOUS | Status: DC
  Administered 2017-12-04: 150 mL/h via INTRAVENOUS

## 2017-12-04 MED ORDER — ONDANSETRON HCL 4 MG/2ML IJ SOLN
INTRAMUSCULAR | Status: DC | PRN
  Administered 2017-12-04: 4 mg via INTRAVENOUS

## 2017-12-04 MED ORDER — SUCCINYLCHOLINE CHLORIDE 200 MG/10ML IV SOSY
PREFILLED_SYRINGE | INTRAVENOUS | Status: DC | PRN
  Administered 2017-12-04: 120 mg via INTRAVENOUS

## 2017-12-04 MED ORDER — HYDROMORPHONE HCL 1 MG/ML IJ SOLN
1.0000 mg | INTRAMUSCULAR | Status: DC | PRN
  Administered 2017-12-04 (×3): 1 mg via INTRAVENOUS
  Filled 2017-12-04 (×3): qty 1

## 2017-12-04 MED ORDER — HYDROMORPHONE HCL 1 MG/ML IJ SOLN
1.0000 mg | Freq: Once | INTRAMUSCULAR | Status: AC
  Administered 2017-12-04: 1 mg via INTRAVENOUS
  Filled 2017-12-04: qty 1

## 2017-12-04 MED ORDER — CHLORHEXIDINE GLUCONATE CLOTH 2 % EX PADS
6.0000 | MEDICATED_PAD | Freq: Every day | CUTANEOUS | Status: DC
  Administered 2017-12-05: 6 via TOPICAL

## 2017-12-04 MED ORDER — MIDAZOLAM HCL 5 MG/5ML IJ SOLN
INTRAMUSCULAR | Status: DC | PRN
  Administered 2017-12-04: 2 mg via INTRAVENOUS

## 2017-12-04 MED ORDER — PHENYLEPHRINE 40 MCG/ML (10ML) SYRINGE FOR IV PUSH (FOR BLOOD PRESSURE SUPPORT)
PREFILLED_SYRINGE | INTRAVENOUS | Status: DC | PRN
  Administered 2017-12-04: 80 ug via INTRAVENOUS

## 2017-12-04 MED ORDER — LACTATED RINGERS IV SOLN
INTRAVENOUS | Status: DC
  Administered 2017-12-04 – 2017-12-05 (×3): via INTRAVENOUS

## 2017-12-04 MED ORDER — ROCURONIUM BROMIDE 10 MG/ML (PF) SYRINGE
PREFILLED_SYRINGE | INTRAVENOUS | Status: AC
  Filled 2017-12-04: qty 10

## 2017-12-04 MED ORDER — OXYCODONE HCL 5 MG PO TABS: 5.0000 mg | ORAL_TABLET | ORAL | Status: DC | PRN

## 2017-12-04 MED ORDER — HYDROMORPHONE HCL 1 MG/ML IJ SOLN
1.0000 mg | INTRAMUSCULAR | Status: DC | PRN
  Administered 2017-12-04: 1 mg via INTRAVENOUS
  Filled 2017-12-04: qty 1

## 2017-12-04 MED ORDER — DIPHENHYDRAMINE HCL 50 MG/ML IJ SOLN: 25.0000 mg | Freq: Four times a day (QID) | INTRAMUSCULAR | Status: DC | PRN

## 2017-12-04 MED ORDER — FENTANYL CITRATE (PF) 250 MCG/5ML IJ SOLN
INTRAMUSCULAR | Status: AC
  Filled 2017-12-04: qty 5

## 2017-12-04 MED ORDER — DIPHENHYDRAMINE HCL 25 MG PO CAPS: 25.0000 mg | ORAL_CAPSULE | Freq: Four times a day (QID) | ORAL | Status: DC | PRN

## 2017-12-04 MED ORDER — ONDANSETRON HCL 4 MG/2ML IJ SOLN
4.0000 mg | Freq: Three times a day (TID) | INTRAMUSCULAR | Status: DC | PRN
  Administered 2017-12-04: 4 mg via INTRAVENOUS
  Filled 2017-12-04: qty 2

## 2017-12-04 MED ORDER — SUGAMMADEX SODIUM 500 MG/5ML IV SOLN
INTRAVENOUS | Status: AC
  Filled 2017-12-04: qty 5

## 2017-12-04 MED ORDER — ACETAMINOPHEN 500 MG PO TABS
1000.0000 mg | ORAL_TABLET | Freq: Four times a day (QID) | ORAL | Status: DC
  Administered 2017-12-04 – 2017-12-05 (×4): 1000 mg via ORAL
  Filled 2017-12-04 (×4): qty 2

## 2017-12-04 MED ORDER — METRONIDAZOLE IN NACL 5-0.79 MG/ML-% IV SOLN
500.0000 mg | Freq: Three times a day (TID) | INTRAVENOUS | Status: DC
  Administered 2017-12-04: 500 mg via INTRAVENOUS
  Filled 2017-12-04 (×2): qty 100

## 2017-12-04 MED ORDER — ONDANSETRON HCL 4 MG/2ML IJ SOLN: 4.0000 mg | Freq: Four times a day (QID) | INTRAMUSCULAR | Status: DC | PRN

## 2017-12-04 MED ORDER — 0.9 % SODIUM CHLORIDE (POUR BTL) OPTIME
TOPICAL | Status: DC | PRN
  Administered 2017-12-04: 1000 mL

## 2017-12-04 MED ORDER — DEXAMETHASONE SODIUM PHOSPHATE 10 MG/ML IJ SOLN
INTRAMUSCULAR | Status: DC | PRN
  Administered 2017-12-04: 10 mg via INTRAVENOUS

## 2017-12-04 MED ORDER — FENTANYL CITRATE (PF) 250 MCG/5ML IJ SOLN
INTRAMUSCULAR | Status: DC | PRN
  Administered 2017-12-04: 100 ug via INTRAVENOUS
  Administered 2017-12-04: 50 ug via INTRAVENOUS

## 2017-12-04 MED ORDER — PHENYLEPHRINE 40 MCG/ML (10ML) SYRINGE FOR IV PUSH (FOR BLOOD PRESSURE SUPPORT)
PREFILLED_SYRINGE | INTRAVENOUS | Status: AC
  Filled 2017-12-04: qty 10

## 2017-12-04 MED ORDER — SODIUM CHLORIDE 0.9 % IV SOLN
2.0000 g | INTRAVENOUS | Status: DC
  Filled 2017-12-04: qty 20

## 2017-12-04 MED ORDER — LACTATED RINGERS IR SOLN
Status: DC | PRN
  Administered 2017-12-04: 1

## 2017-12-04 MED ORDER — METRONIDAZOLE IN NACL 5-0.79 MG/ML-% IV SOLN
500.0000 mg | Freq: Once | INTRAVENOUS | Status: AC
  Administered 2017-12-04: 500 mg via INTRAVENOUS
  Filled 2017-12-04: qty 100

## 2017-12-04 MED ORDER — MIDAZOLAM HCL 2 MG/2ML IJ SOLN
INTRAMUSCULAR | Status: AC
  Filled 2017-12-04: qty 2

## 2017-12-04 MED ORDER — HYDROMORPHONE HCL 1 MG/ML IJ SOLN
0.2500 mg | INTRAMUSCULAR | Status: DC | PRN
  Administered 2017-12-04: 0.25 mg via INTRAVENOUS
  Administered 2017-12-04: 0.5 mg via INTRAVENOUS
  Administered 2017-12-04: 0.25 mg via INTRAVENOUS

## 2017-12-04 MED ORDER — SUGAMMADEX SODIUM 500 MG/5ML IV SOLN
INTRAVENOUS | Status: DC | PRN
  Administered 2017-12-04: 500 mg via INTRAVENOUS

## 2017-12-04 MED ORDER — OXYCODONE HCL 5 MG PO TABS: 5.0000 mg | ORAL_TABLET | Freq: Once | ORAL | Status: DC | PRN

## 2017-12-04 MED ORDER — DEXAMETHASONE SODIUM PHOSPHATE 10 MG/ML IJ SOLN
INTRAMUSCULAR | Status: AC
  Filled 2017-12-04: qty 1

## 2017-12-04 MED ORDER — SUCCINYLCHOLINE CHLORIDE 200 MG/10ML IV SOSY
PREFILLED_SYRINGE | INTRAVENOUS | Status: AC
  Filled 2017-12-04: qty 10

## 2017-12-04 MED ORDER — ENOXAPARIN SODIUM 40 MG/0.4ML ~~LOC~~ SOLN
40.0000 mg | SUBCUTANEOUS | Status: DC
  Administered 2017-12-04: 40 mg via SUBCUTANEOUS
  Filled 2017-12-04: qty 0.4

## 2017-12-04 MED ORDER — MORPHINE SULFATE (PF) 2 MG/ML IV SOLN: 2.0000 mg | INTRAVENOUS | Status: DC | PRN

## 2017-12-04 MED ORDER — BUPIVACAINE HCL 0.25 % IJ SOLN
INTRAMUSCULAR | Status: DC | PRN
  Administered 2017-12-04: 12 mL

## 2017-12-04 MED ORDER — MUPIROCIN 2 % EX OINT
1.0000 "application " | TOPICAL_OINTMENT | Freq: Two times a day (BID) | CUTANEOUS | Status: DC
  Administered 2017-12-05 (×2): 1 via NASAL
  Filled 2017-12-04 (×2): qty 22

## 2017-12-04 MED ORDER — PROPOFOL 10 MG/ML IV BOLUS
INTRAVENOUS | Status: DC | PRN
  Administered 2017-12-04: 200 mg via INTRAVENOUS

## 2017-12-04 MED ORDER — ROCURONIUM BROMIDE 10 MG/ML (PF) SYRINGE
PREFILLED_SYRINGE | INTRAVENOUS | Status: DC | PRN
  Administered 2017-12-04: 50 mg via INTRAVENOUS
  Administered 2017-12-04: 10 mg via INTRAVENOUS

## 2017-12-04 MED ORDER — PROMETHAZINE HCL 25 MG/ML IJ SOLN: 6.2500 mg | INTRAMUSCULAR | Status: DC | PRN

## 2017-12-04 MED ORDER — KETOROLAC TROMETHAMINE 30 MG/ML IJ SOLN
30.0000 mg | Freq: Four times a day (QID) | INTRAMUSCULAR | Status: DC | PRN
  Administered 2017-12-04: 30 mg via INTRAVENOUS
  Filled 2017-12-04: qty 1

## 2017-12-04 MED ORDER — LIDOCAINE 2% (20 MG/ML) 5 ML SYRINGE
INTRAMUSCULAR | Status: AC
  Filled 2017-12-04: qty 5

## 2017-12-04 MED ORDER — BUPIVACAINE HCL (PF) 0.25 % IJ SOLN
INTRAMUSCULAR | Status: AC
  Filled 2017-12-04: qty 30

## 2017-12-04 MED ORDER — ONDANSETRON 4 MG PO TBDP: 4.0000 mg | ORAL_TABLET | Freq: Four times a day (QID) | ORAL | Status: DC | PRN

## 2017-12-04 MED ORDER — IBUPROFEN 200 MG PO TABS: 600.0000 mg | ORAL_TABLET | Freq: Four times a day (QID) | ORAL | Status: DC | PRN

## 2017-12-04 SURGICAL SUPPLY — 45 items
ADH SKN CLS APL DERMABOND .7 (GAUZE/BANDAGES/DRESSINGS) ×1
BAG SPEC RTRVL 10 TROC 200 (ENDOMECHANICALS) ×1
BAG SPEC RTRVL LRG 6X4 10 (ENDOMECHANICALS)
CABLE HIGH FREQUENCY MONO STRZ (ELECTRODE) ×1 IMPLANT
CHLORAPREP W/TINT 26ML (MISCELLANEOUS) ×2 IMPLANT
COVER SURGICAL LIGHT HANDLE (MISCELLANEOUS) ×2 IMPLANT
COVER WAND RF STERILE (DRAPES) ×1 IMPLANT
CUTTER FLEX LINEAR 45M (STAPLE) ×2 IMPLANT
DECANTER SPIKE VIAL GLASS SM (MISCELLANEOUS) ×2 IMPLANT
DERMABOND ADVANCED (GAUZE/BANDAGES/DRESSINGS) ×1
DERMABOND ADVANCED .7 DNX12 (GAUZE/BANDAGES/DRESSINGS) ×1 IMPLANT
DRAPE LAPAROSCOPIC ABDOMINAL (DRAPES) ×2 IMPLANT
ELECT PENCIL ROCKER SW 15FT (MISCELLANEOUS) IMPLANT
ELECT REM PT RETURN 15FT ADLT (MISCELLANEOUS) ×2 IMPLANT
GLOVE BIO SURGEON STRL SZ7 (GLOVE) ×4 IMPLANT
GLOVE BIOGEL PI IND STRL 7.0 (GLOVE) ×1 IMPLANT
GLOVE BIOGEL PI IND STRL 7.5 (GLOVE) ×2 IMPLANT
GLOVE BIOGEL PI INDICATOR 7.0 (GLOVE) ×1
GLOVE BIOGEL PI INDICATOR 7.5 (GLOVE) ×2
GOWN STRL REUS W/ TWL XL LVL3 (GOWN DISPOSABLE) ×1 IMPLANT
GOWN STRL REUS W/TWL LRG LVL3 (GOWN DISPOSABLE) ×3 IMPLANT
GOWN STRL REUS W/TWL XL LVL3 (GOWN DISPOSABLE) ×4 IMPLANT
KIT BASIN OR (CUSTOM PROCEDURE TRAY) ×2 IMPLANT
POUCH RETRIEVAL ECOSAC 10 (ENDOMECHANICALS) ×1 IMPLANT
POUCH RETRIEVAL ECOSAC 10MM (ENDOMECHANICALS) ×1
POUCH SPECIMEN RETRIEVAL 10MM (ENDOMECHANICALS) IMPLANT
RELOAD 45 VASCULAR/THIN (ENDOMECHANICALS) IMPLANT
RELOAD STAPLE 45 2.5 WHT GRN (ENDOMECHANICALS) IMPLANT
RELOAD STAPLE 45 3.5 BLU ETS (ENDOMECHANICALS) ×1 IMPLANT
RELOAD STAPLE TA45 3.5 REG BLU (ENDOMECHANICALS) ×2 IMPLANT
SET IRRIG TUBING LAPAROSCOPIC (IRRIGATION / IRRIGATOR) ×2 IMPLANT
SHEARS HARMONIC ACE PLUS 36CM (ENDOMECHANICALS) ×2 IMPLANT
SLEEVE XCEL OPT CAN 5 100 (ENDOMECHANICALS) ×1 IMPLANT
SOLUTION ANTI FOG 6CC (MISCELLANEOUS) ×2 IMPLANT
SUT MNCRL AB 4-0 PS2 18 (SUTURE) ×2 IMPLANT
SUT VICRYL 0 ENDOLOOP (SUTURE) IMPLANT
SUT VICRYL 0 UR6 27IN ABS (SUTURE) ×1 IMPLANT
TAPE STRIPS DRAPE STRL (GAUZE/BANDAGES/DRESSINGS) ×1 IMPLANT
TOWEL OR 17X26 10 PK STRL BLUE (TOWEL DISPOSABLE) ×2 IMPLANT
TOWEL OR NON WOVEN STRL DISP B (DISPOSABLE) ×2 IMPLANT
TRAY FOLEY MTR SLVR 14FR STAT (SET/KITS/TRAYS/PACK) ×1 IMPLANT
TRAY FOLEY MTR SLVR 16FR STAT (SET/KITS/TRAYS/PACK) ×1 IMPLANT
TRAY LAPAROSCOPIC (CUSTOM PROCEDURE TRAY) ×2 IMPLANT
TROCAR XCEL BLUNT TIP 100MML (ENDOMECHANICALS) ×2 IMPLANT
TUBING INSUF HEATED (TUBING) ×2 IMPLANT
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
## 2017-12-04 NOTE — Progress Notes (Signed)
Subjective/Chief Complaint: rlq pain   Objective: Vital signs in last 24 hours: Temp:  [97.9 F (36.6 C)-98.8 F (37.1 C)] 97.9 F (36.6 C) (11/15 0433) Pulse Rate:  [76-102] 76 (11/15 0433) Resp:  [14-20] 18 (11/15 0433) BP: (116-176)/(74-97) 116/74 (11/15 0433) SpO2:  [93 %-98 %] 95 % (11/15 0433) Weight:  [126.1 kg] 126.1 kg (11/14 2252) Last BM Date: 12/03/17  Intake/Output from previous day: No intake/output data recorded. Intake/Output this shift: No intake/output data recorded.  GI: soft obese tender rlq  Lab Results:  Recent Labs    12/04/17 0026  WBC 9.2  HGB 12.5*  HCT 38.9*  PLT 212   BMET Recent Labs    12/04/17 0026  NA 136  K 4.2  CL 104  CO2 22  GLUCOSE 190*  BUN 14  CREATININE 0.93  CALCIUM 8.8*   PT/INR No results for input(s): LABPROT, INR in the last 72 hours. ABG No results for input(s): PHART, HCO3 in the last 72 hours.  Invalid input(s): PCO2, PO2  Studies/Results: Ct Renal Stone Study  Result Date: 12/04/2017 CLINICAL DATA:  Acute onset of left flank pain.  Nausea. EXAM: CT ABDOMEN AND PELVIS WITHOUT CONTRAST TECHNIQUE: Multidetector CT imaging of the abdomen and pelvis was performed following the standard protocol without IV contrast. COMPARISON:  CT of the abdomen and pelvis performed 03/22/2016 FINDINGS: Lower chest: The visualized lung bases are grossly clear. The visualized portions of the mediastinum are unremarkable. Hepatobiliary: The liver is unremarkable in appearance. The gallbladder is contracted, with a stone noted in the gallbladder. An apparent exophytic nodule arising at the gallbladder is relatively stable in size, measuring 1.5 cm, and is thought to reflect a stone within a Phrygian cap. The common bile duct is normal in caliber. Pancreas: The pancreas is within normal limits. Spleen: The spleen is unremarkable in appearance. Adrenals/Urinary Tract: The adrenal glands are unremarkable in appearance. The kidneys  are within normal limits. There is no evidence of hydronephrosis. No renal or ureteral stones are identified. Mild nonspecific perinephric stranding is noted bilaterally. Stomach/Bowel: There is dilatation of the appendix to 1.4 cm in diameter, with mild soft tissue inflammation about the proximal appendix, raising concern for mild acute appendicitis. The distal appendix is only slightly dilated. There is no evidence of perforation or abscess formation at this time. No free fluid is seen. No appendicoliths are identified. The colon is grossly unremarkable in appearance. The small bowel is largely decompressed and unremarkable in appearance. The stomach is within normal limits. Vascular/Lymphatic: Minimal calcification is noted at the distal abdominal aorta and its branches. The IVC is grossly unremarkable. No retroperitoneal or pelvic sidewall lymphadenopathy is seen. Reproductive: The bladder is mildly distended and grossly unremarkable. The patient is status post prostatectomy. A peripherally calcified 4.1 cm cystic lesion is again noted at the left upper pelvis, stable from 2018 and likely benign. This may reflect a lymphocele. Other: No additional soft tissue abnormalities are seen. Musculoskeletal: No acute osseous abnormalities are identified. Chronic loss of height is again noted at vertebral body L2. The visualized musculature is unremarkable in appearance. IMPRESSION: 1. Dilatation of the appendix to 1.4 cm in diameter, with mild soft tissue inflammation about the proximal appendix, raising concern for mild appendicitis. Would correlate with the patient's symptoms. No evidence of perforation or abscess formation at this time. No free fluid seen. No appendicoliths identified. 2. Cholelithiasis.  Gallbladder otherwise grossly unremarkable. 3. Stable 4.1 cm peripherally calcified cystic lesion at the left upper  pelvis is benign in appearance and may reflect a lymphocele. These results were called by telephone  at the time of interpretation on 12/04/2017 at 12:11 am to Dr. Shanon Rosser, who verbally acknowledged these results. Electronically Signed   By: Garald Balding M.D.   On: 12/04/2017 00:13    Anti-infectives: Anti-infectives (From admission, onward)   Start     Dose/Rate Route Frequency Ordered Stop   12/04/17 2200  cefTRIAXone (ROCEPHIN) 2 g in sodium chloride 0.9 % 100 mL IVPB     2 g 200 mL/hr over 30 Minutes Intravenous Every 24 hours 12/04/17 0708     12/04/17 1200  metroNIDAZOLE (FLAGYL) IVPB 500 mg     500 mg 100 mL/hr over 60 Minutes Intravenous Every 8 hours 12/04/17 0708     12/04/17 0130  cefTRIAXone (ROCEPHIN) 2 g in sodium chloride 0.9 % 100 mL IVPB     2 g 200 mL/hr over 30 Minutes Intravenous  Once 12/04/17 0123 12/04/17 0232   12/04/17 0130  metroNIDAZOLE (FLAGYL) IVPB 500 mg     500 mg 100 mL/hr over 60 Minutes Intravenous  Once 12/04/17 0123 12/04/17 0415      Assessment/Plan: Acute appendicitis Plan for lap appy today. Discussed surgery, risks and recovery   Rolm Bookbinder 12/04/2017
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
## 2017-12-04 NOTE — Discharge Instructions (Signed)
[facility] LAPAROSCOPIC SURGERY: POST OP INSTRUCTIONS Always review your discharge instruction sheet given to you by the facility where your surgery was performed. IF YOU HAVE DISABILITY OR FAMILY LEAVE FORMS, YOU MUST BRING THEM TO THE OFFICE FOR PROCESSING.   DO NOT GIVE THEM TO YOUR DOCTOR.  PAIN CONTROL  1. First take acetaminophen (Tylenol) AND/or ibuprofen (Advil) to control your pain after surgery.  Follow directions on package.  Taking acetaminophen (Tylenol) and/or ibuprofen (Advil) regularly after surgery will help to control your pain and lower the amount of prescription pain medication you may need.  You should not take more than 3,000 mg (3 grams) of acetaminophen (Tylenol) in 24 hours.  You should not take ibuprofen (Advil), aleve, motrin, naprosyn or other NSAIDS if you have a history of stomach ulcers or chronic kidney disease.  2. A prescription for pain medication may be given to you upon discharge.  Take your pain medication as prescribed, if you still have uncontrolled pain after taking acetaminophen (Tylenol) or ibuprofen (Advil). 3. Use ice packs to help control pain. 4. If you need a refill on your pain medication, please contact your pharmacy.  They will contact our office to request authorization. Prescriptions will not be filled after 5pm or on week-ends.  HOME MEDICATIONS 5. Take your usually prescribed medications unless otherwise directed.  DIET 6. You should follow a light diet the first few days after arrival home.  Be sure to include lots of fluids daily. Avoid fatty, fried foods.   CONSTIPATION 7. It is common to experience some constipation after surgery and if you are taking pain medication.  Increasing fluid intake and taking a stool softener (such as Colace) will usually help or prevent this problem from occurring.  A mild laxative (Milk of Magnesia or Miralax) should be taken according to package instructions if there are no bowel  movements after 48 hours.  WOUND/INCISION CARE 8. Most patients will experience some swelling and bruising in the area of the incisions.  Ice packs will help.  Swelling and bruising can take several days to resolve.  9. Unless discharge instructions indicate otherwise, follow guidelines below  a. STERI-STRIPS - you may remove your outer bandages 48 hours after surgery, and you may shower at that time.  You have steri-strips (small skin tapes) in place directly over the incision.  These strips should be left on the skin for 7-10 days.   b. DERMABOND/SKIN GLUE - you may shower in 24 hours.  The glue will flake off over the next 2-3 weeks. 10. Any sutures or staples will be removed at the office during your follow-up visit.  ACTIVITIES 11. You may resume regular (light) daily activities beginning the next day--such as daily self-care, walking, climbing stairs--gradually increasing activities as tolerated.  You may have sexual intercourse when it is comfortable.  Refrain from any heavy lifting or straining until approved by your doctor. a. You may drive when you are no longer taking prescription pain medication, you can comfortably wear a seatbelt, and you can safely maneuver your car and apply brakes.  FOLLOW-UP 12. You should see your doctor in the office for a follow-up appointment approximately 2-3 weeks after your surgery.  You should have been given your post-op/follow-up appointment when your surgery was scheduled.  If you did not receive a post-op/follow-up appointment, make sure that you call for this appointment within a day or two after you arrive home to insure a convenient appointment time.  WHEN   TO CALL YOUR DOCTOR: 1. Fever over 101.0 2. Inability to urinate 3. Continued bleeding from incision. 4. Increased pain, redness, or drainage from the incision. 5. Increasing abdominal pain  The clinic staff is available to answer your questions during regular business hours.  Please don't  hesitate to call and ask to speak to one of the nurses for clinical concerns.  If you have a medical emergency, go to the nearest emergency room or call 911.  A surgeon from [facility] is always on call at the hospital. [address], [address], [place], [place]  [address] ? [address] [address], [place], [place]   [address] [phone] ? [phone] ? FAX [phone] Web site: [email or web address]  

## 2017-12-04 NOTE — Progress Notes (Signed)
CRITICAL VALUE ALERT  Critical Value:  MRSA +  Date & Time Notied:  12/04/2017 1552  Provider Notified: CCS  Orders Received/Actions taken: Standing orders.

## 2017-12-04 NOTE — Transfer of Care (Signed)
Immediate Anesthesia Transfer of Care Note  Patient: David Hartman  Procedure(s) Performed: APPENDECTOMY LAPAROSCOPIC (N/A )  Patient Location: PACU  Anesthesia Type:General  Level of Consciousness: sedated  Airway & Oxygen Therapy: Patient Spontanous Breathing and Patient connected to face mask oxygen  Post-op Assessment: Report given to RN and Post -op Vital signs reviewed and stable  Post vital signs: Reviewed and stable  Last Vitals:  Vitals Value Taken Time  BP    Temp    Pulse 81 12/04/2017  3:26 PM  Resp 14 12/04/2017  3:26 PM  SpO2 94 % 12/04/2017  3:26 PM  Vitals shown include unvalidated device data.  Last Pain:  Vitals:   12/04/17 1224  TempSrc: Oral  PainSc:       Patients Stated Pain Goal: 2 (28/24/17 5301)  Complications: No apparent anesthesia complications

## 2017-12-04 NOTE — Anesthesia Postprocedure Evaluation (Signed)
Anesthesia Post Note  Patient: David Hartman  Procedure(s) Performed: APPENDECTOMY LAPAROSCOPIC (N/A )     Patient location during evaluation: PACU Anesthesia Type: General Level of consciousness: awake and alert Pain management: pain level controlled Vital Signs Assessment: post-procedure vital signs reviewed and stable Respiratory status: spontaneous breathing, nonlabored ventilation and respiratory function stable Cardiovascular status: blood pressure returned to baseline and stable Postop Assessment: no apparent nausea or vomiting Anesthetic complications: no    Last Vitals:  Vitals:   12/04/17 1613 12/04/17 1625  BP: (!) 150/77 (!) 157/91  Pulse: 84 85  Resp: 16   Temp: 36.9 C 36.6 C  SpO2: 95%     Last Pain:  Vitals:   12/04/17 1625  TempSrc: Oral  PainSc:                  Lynda Rainwater

## 2017-12-04 NOTE — Op Note (Signed)
Preoperative diagnosis: Acute appendicitis Postoperative diagnosis: Same as above Procedure: Laparoscopic appendectomy Surgeon: Dr. Serita Grammes Anesthesia: General Estimated blood loss: Less than 20 cc Drains: None Complications: None Specimens: Appendix to pathology Special count was correct x2 at end of operation Disposition to recovery in stable condition  Indications: This is a morbidly obese 57 year old male who presents with right lower quadrant pain, elevated white blood cell count, and a CT scan consistent with acute appendicitis.  I discussed with him going to the operating room for laparoscopic appendectomy.  Procedure: After informed consent was obtained the patient was taken to the operating room.  He was already on antibiotics.  SCDs were in place.  He was placed under general endotracheal anesthesia without complication.  A Foley catheter was placed without difficulty.  He was then prepped and draped in the standard sterile surgical fashion.  A surgical timeout was then performed.  I then infiltrated Marcaine below his umbilicus.  I made a vertical incision.  I grasped his fascia with a Coker clamp.  I then incised the fascia and entered the peritoneum bluntly.  There was no evidence of an entry injury.  I then placed a 0 Vicryl pursestring suture.  I then placed a Hassan trocar and insufflated the abdomen to 15 mmHg pressure.  I then placed 2 further 5 mm trocars in the suprapubic region and left abdomen.  In this case was very difficult due to his morbid obesity.  I eventually placed an additional 5 mm trocar in the upper abdomen.  He required him to be rotated to the left just to be able to visualize the cecum which was very high riding.  Eventually I was able to identify the terminal ileum and the cecum.  The appendix was not perforated.  He did have acute suppurative appendicitis.  I then was able to grasp the end of the appendix.  It was retrocecal in nature and adherent to the  retroperitoneum.  I then used a harmonic scalpel to take down the mesentery attachments.  Once I had done this I was able to identify the base.  I divided the base with a GIA stapler.  I then placed the appendix in a bag and removed it.  Hemostasis was observed.  These were removed my Hassan trocar and tied down my pursestring.  Placed 2-0 Vicryl sutures through the umbilical defect.  The trocars were all removed and the abdomen was desufflated.  These were closed with 4-0 Monocryl.  He tolerated well was extubated transferred to recovery stable.

## 2017-12-04 NOTE — Anesthesia Procedure Notes (Signed)
Procedure Name: Intubation Date/Time: 12/04/2017 2:04 PM Performed by: Talbot Grumbling, CRNA Pre-anesthesia Checklist: Patient identified, Emergency Drugs available, Suction available and Patient being monitored Patient Re-evaluated:Patient Re-evaluated prior to induction Oxygen Delivery Method: Circle system utilized Preoxygenation: Pre-oxygenation with 100% oxygen Induction Type: IV induction, Rapid sequence and Cricoid Pressure applied Laryngoscope Size: Mac and 4 Grade View: Grade II Tube type: Oral Tube size: 7.5 mm Number of attempts: 1 Airway Equipment and Method: Stylet Placement Confirmation: ETT inserted through vocal cords under direct vision,  positive ETCO2 and breath sounds checked- equal and bilateral Secured at: 23 cm Tube secured with: Tape Dental Injury: Teeth and Oropharynx as per pre-operative assessment

## 2017-12-04 NOTE — Anesthesia Preprocedure Evaluation (Signed)
Anesthesia Evaluation  Patient identified by MRN, date of birth, ID band Patient awake    Reviewed: Allergy & Precautions, NPO status , Patient's Chart, lab work & pertinent test results, reviewed documented beta blocker date and time   History of Anesthesia Complications (+) history of anesthetic complications  Airway Mallampati: II  TM Distance: >3 FB Neck ROM: Full    Dental no notable dental hx.    Pulmonary neg pulmonary ROS,    Pulmonary exam normal breath sounds clear to auscultation       Cardiovascular hypertension, Pt. on medications and Pt. on home beta blockers Normal cardiovascular exam Rhythm:Regular Rate:Normal  Stress echo 2014 No ischemia/wma, ef 65%   Neuro/Psych PSYCHIATRIC DISORDERS Anxiety Depression negative neurological ROS     GI/Hepatic Neg liver ROS, hiatal hernia, GERD  ,  Endo/Other  negative endocrine ROS  Renal/GU Renal disease     Musculoskeletal  (+) Arthritis , Osteoarthritis,    Abdominal (+) + obese,   Peds  Hematology negative hematology ROS (+)   Anesthesia Other Findings   Reproductive/Obstetrics                             Anesthesia Physical  Anesthesia Plan  ASA: III  Anesthesia Plan: General   Post-op Pain Management:    Induction: Intravenous, Rapid sequence and Cricoid pressure planned  PONV Risk Score and Plan: 2 and Ondansetron and Midazolam  Airway Management Planned: Oral ETT  Additional Equipment:   Intra-op Plan:   Post-operative Plan: Extubation in OR  Informed Consent: I have reviewed the patients History and Physical, chart, labs and discussed the procedure including the risks, benefits and alternatives for the proposed anesthesia with the patient or authorized representative who has indicated his/her understanding and acceptance.   Dental advisory given  Plan Discussed with: CRNA  Anesthesia Plan Comments:          Anesthesia Quick Evaluation

## 2017-12-05 ENCOUNTER — Encounter (HOSPITAL_COMMUNITY): Payer: Self-pay | Admitting: General Surgery

## 2017-12-05 MED ORDER — OXYCODONE HCL 5 MG PO TABS: 5.0000 mg | ORAL_TABLET | Freq: Four times a day (QID) | ORAL | 0 refills | Status: DC | PRN

## 2017-12-05 NOTE — Progress Notes (Signed)
Discharge instructions/prescription given/explained with pt verbalizing understanding. Pt will go home this morning with his daughter. Alert, oriented, and without c/o. Pt aware to followup.

## 2017-12-05 NOTE — Discharge Summary (Signed)
  Patient ID: David Hartman [id number] 57 y.o. 06/03/1960  12/03/2017  Discharge date and time: 12/05/2017   Admitting Physician: Edward Jolly  Discharge Physician: Edward Jolly  Admission Diagnoses: Uncomplicated acute appendicitis [K35.80]  Discharge Diagnoses: Same  Operations: Procedure(s): APPENDECTOMY LAPAROSCOPIC  Admission Condition: fair  Discharged Condition: good  Indication for Admission: 57 year old male with 24 hours of lower abdominal pain localizing to the right lower quadrant.  CT scan showed evidence of uncomplicated appendicitis.  He was admitted for laparoscopic appendectomy.  Hospital Course: Patient underwent a somewhat difficult but uncomplicated laparoscopic appendectomy for nonperforated appendicitis.  The following morning he is afebrile.  WBC normal.  No significant abdominal tenderness.  Ambulatory and tolerating diet.  Wounds are clean without drainage or erythema.  He is felt ready for discharge.   Disposition: Home  Patient Instructions:  Allergies as of 12/05/2017      Reactions   Trazodone And Nefazodone Other (See Comments)   Patients states "it caused my blood pressure to raise and it didn't help me sleep"      Medication List    TAKE these medications   allopurinol 300 MG tablet Commonly known as:  ZYLOPRIM Take 300 mg by mouth daily.   ALPRAZolam 1 MG tablet Commonly known as:  XANAX Take 0.5 mg by mouth daily as needed for anxiety.   AMLODIPINE BESYLATE PO Take 10 mg by mouth daily.   colchicine 0.6 MG tablet Take 0.6 mg by mouth daily as needed (gout).   DULoxetine 60 MG capsule Commonly known as:  CYMBALTA TAKE 1 CAPSULE BY MOUTH EVERY DAY   lisinopril 40 MG tablet Commonly known as:  PRINIVIL,ZESTRIL Take 40 mg by mouth daily.   metFORMIN 500 MG tablet Commonly known as:  GLUCOPHAGE Take 1,000 mg by mouth daily.   omeprazole 20 MG tablet Commonly known as:  PRILOSEC OTC Take 20 mg by mouth  daily.   oxyCODONE 5 MG immediate release tablet Commonly known as:  Oxy IR/ROXICODONE Take 1 tablet (5 mg total) by mouth every 6 (six) hours as needed for severe pain.   propranolol 40 MG tablet Commonly known as:  INDERAL Take 40 mg by mouth daily.   traMADol 50 MG tablet Commonly known as:  ULTRAM Take 50 mg by mouth daily.   zolpidem 10 MG tablet Commonly known as:  AMBIEN Take 10 mg by mouth every evening.       Activity: activity as tolerated Diet: regular diet Wound Care: none needed  Follow-up:  With [facility] in 2 weeks.  Signed: Edward Jolly MD, FACS  12/05/2017, 9:41 AM

## 2018-01-01 ENCOUNTER — Encounter: Payer: BLUE CROSS/BLUE SHIELD | Admitting: Physical Medicine & Rehabilitation

## 2018-01-21 ENCOUNTER — Encounter: Payer: BLUE CROSS/BLUE SHIELD | Admitting: Physical Medicine & Rehabilitation

## 2018-01-28 DIAGNOSIS — G8929 Other chronic pain: Secondary | ICD-10-CM | POA: Insufficient documentation

## 2018-02-05 ENCOUNTER — Encounter: Payer: BLUE CROSS/BLUE SHIELD | Admitting: Physical Medicine & Rehabilitation
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
## 2018-02-26 ENCOUNTER — Encounter
Payer: BLUE CROSS/BLUE SHIELD | Attending: Physical Medicine & Rehabilitation | Admitting: Physical Medicine & Rehabilitation

## 2018-03-16 IMAGING — US US ABDOMEN LIMITED
1 series · 14 of 25 positions shown · non-contrast
Comparison: CT abdomen pelvis dated 07/27/2015

CLINICAL DATA: Right upper quadrant pain x1 day

EXAM:
US ABDOMEN LIMITED - RIGHT UPPER QUADRANT

[Series 1: us abdomen limited · 0.24mm/px · 14 of 56 slices shown]
[im 1/56]
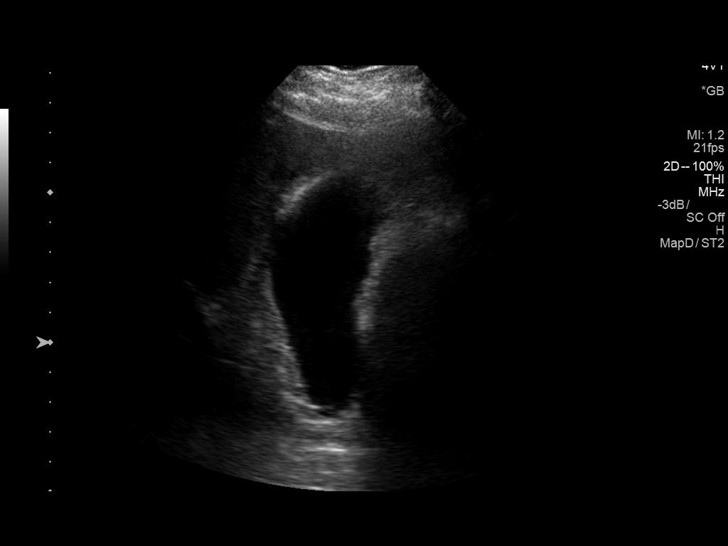
[im 5/56]
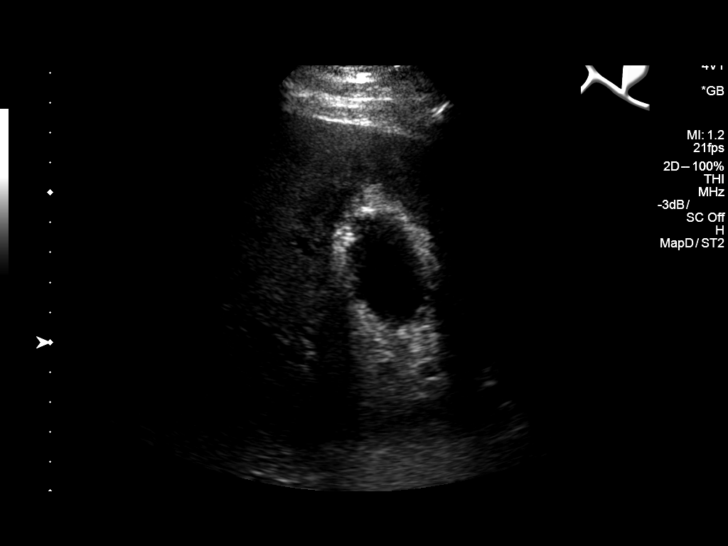
[im 10/56]
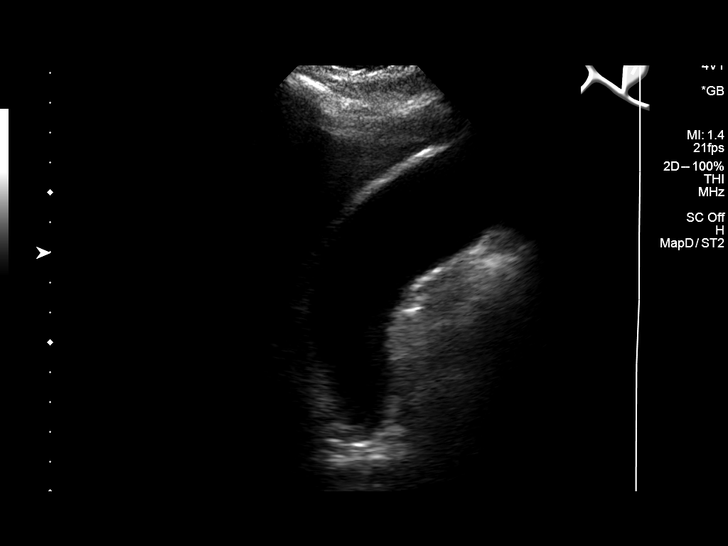
[im 14/56]
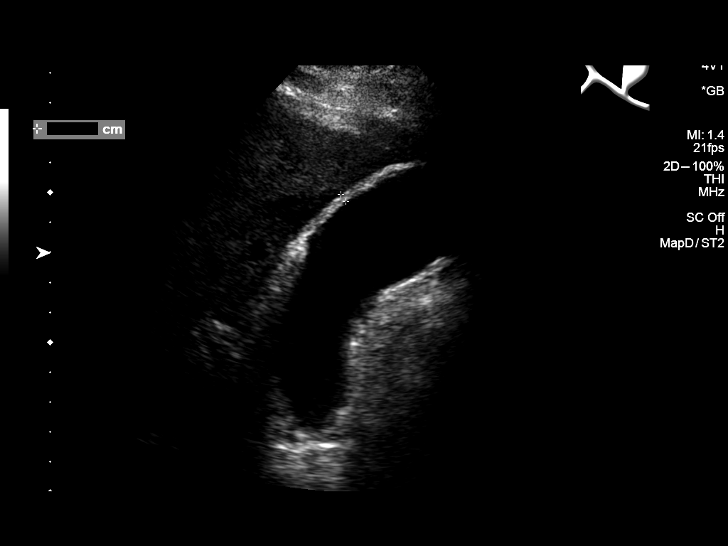
[im 19/56]
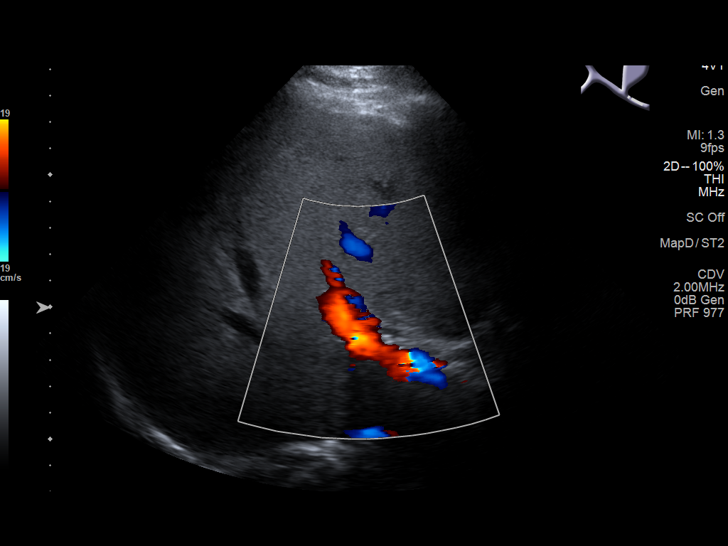
[im 21/56]
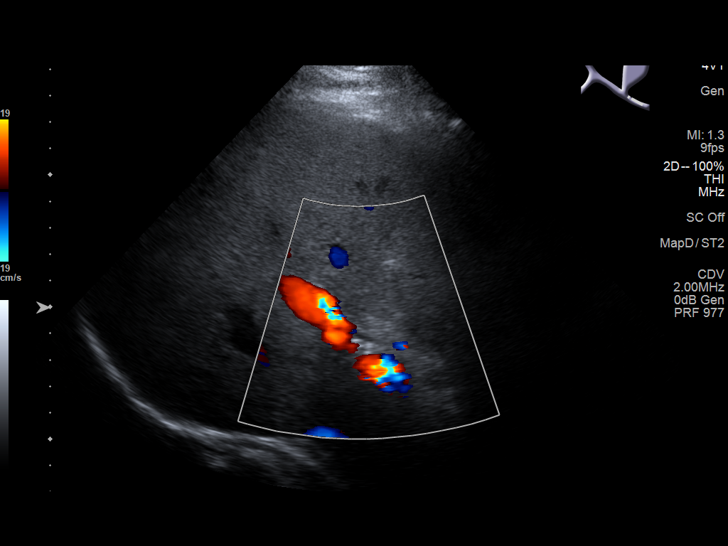
[im 26/56]
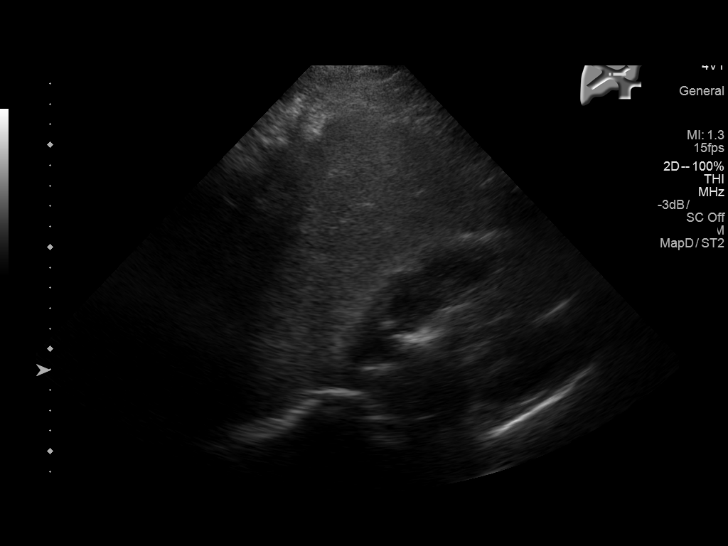
[im 30/56]
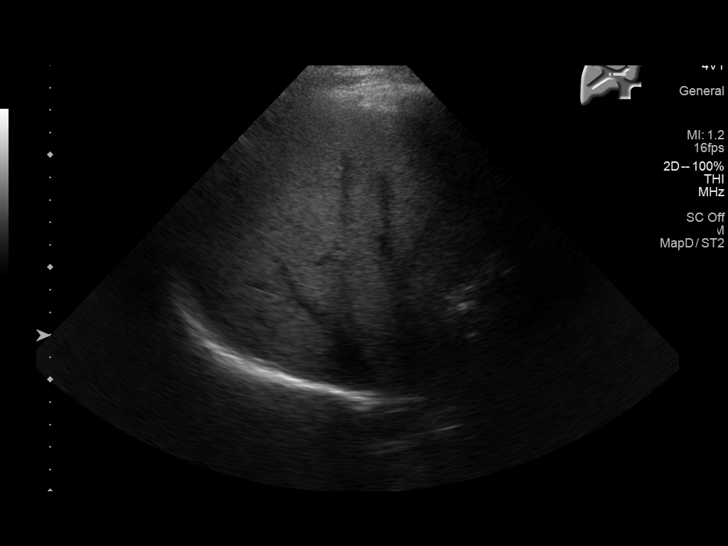
[im 35/56]
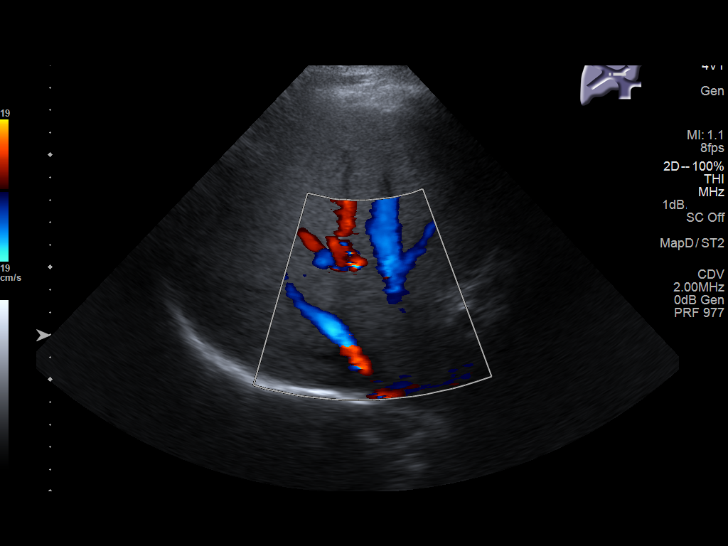
[im 37/56]
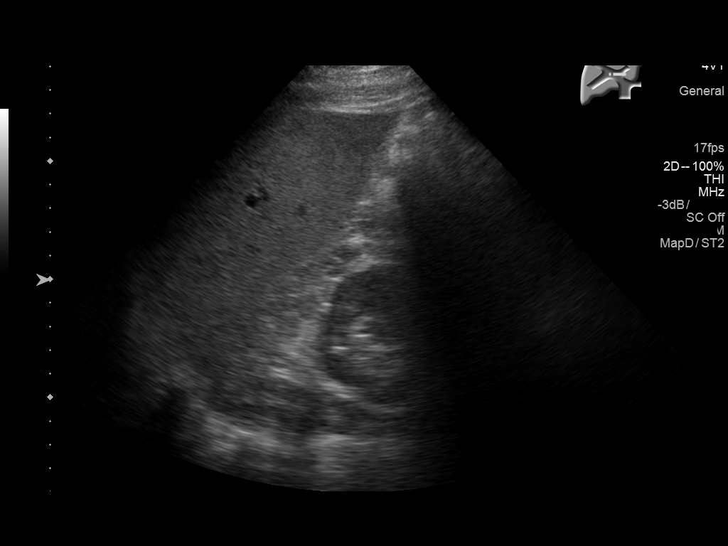
[im 42/56]
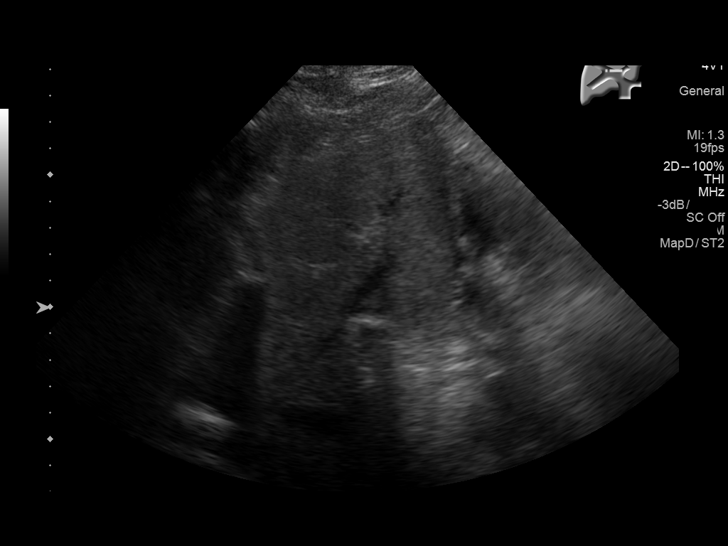
[im 46/56]
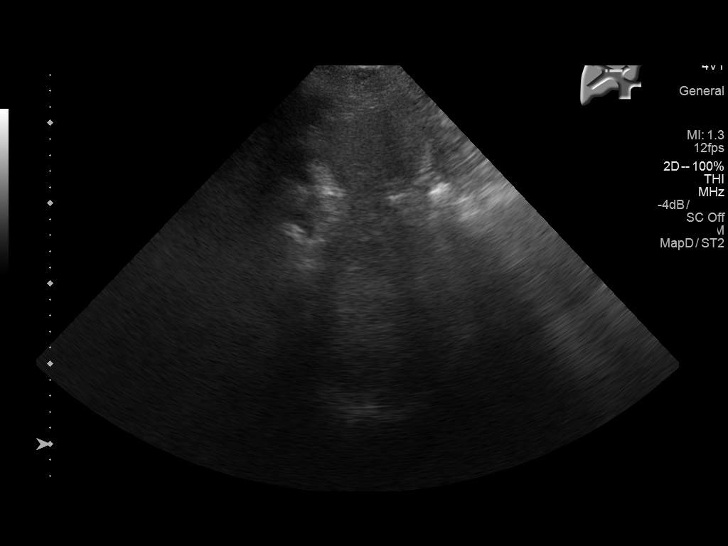
[im 51/56]
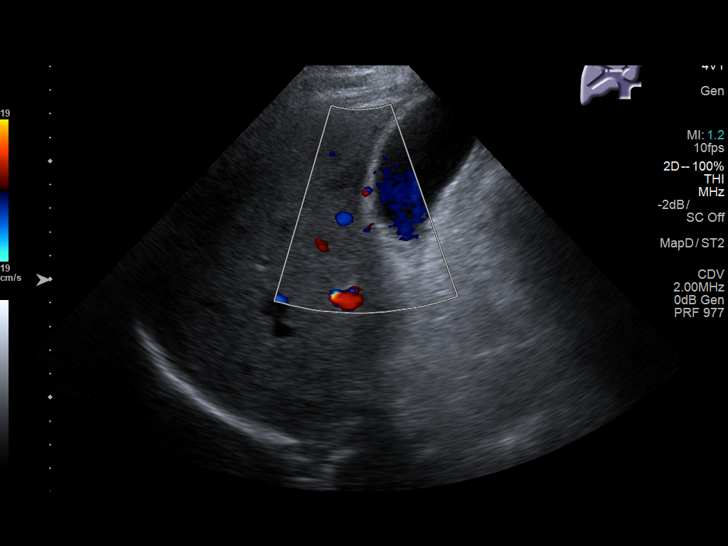
[im 56/56]
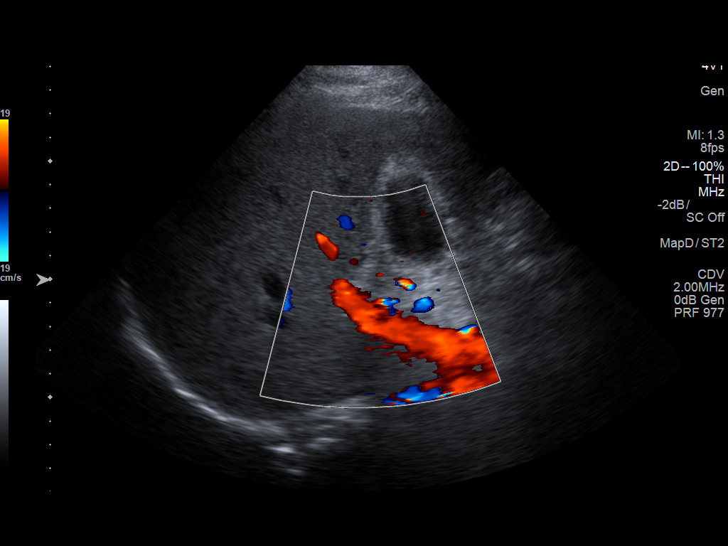

[14 of 25 positions shown; findings below may reference images not displayed]

FINDINGS: Gallbladder:

No gallstones, gallbladder wall thickening, or pericholecystic
fluid.

Common bile duct:

Diameter: 3 mm

Liver:

Hyperechoic hepatic parenchyma, suggesting hepatic steatosis, with
focal fatty sparing along the gallbladder fossa.
IMPRESSION: Hepatic steatosis with focal fatty sparing.

## 2018-06-04 DIAGNOSIS — Z7189 Other specified counseling: Secondary | ICD-10-CM | POA: Insufficient documentation

## 2018-06-04 DIAGNOSIS — J0100 Acute maxillary sinusitis, unspecified: Secondary | ICD-10-CM | POA: Insufficient documentation

## 2018-12-02 DIAGNOSIS — H6123 Impacted cerumen, bilateral: Secondary | ICD-10-CM | POA: Insufficient documentation

## 2019-07-13 ENCOUNTER — Other Ambulatory Visit (HOSPITAL_COMMUNITY): Payer: Self-pay | Admitting: Physician Assistant

## 2019-07-13 ENCOUNTER — Inpatient Hospital Stay (HOSPITAL_COMMUNITY): Admission: RE | Admit: 2019-07-13 | Payer: BLUE CROSS/BLUE SHIELD | Source: Ambulatory Visit

## 2019-07-13 DIAGNOSIS — M7989 Other specified soft tissue disorders: Secondary | ICD-10-CM

## 2019-07-13 DIAGNOSIS — M79604 Pain in right leg: Secondary | ICD-10-CM

## 2019-07-14 ENCOUNTER — Ambulatory Visit (HOSPITAL_COMMUNITY)
Admission: RE | Admit: 2019-07-14 | Discharge: 2019-07-14 | Disposition: A | Discharge status: Home / Self Care | Payer: Medicare Other | Source: Ambulatory Visit | Attending: Physician Assistant | Admitting: Physician Assistant

## 2019-07-14 ENCOUNTER — Other Ambulatory Visit: Payer: Self-pay

## 2019-07-14 DIAGNOSIS — M7989 Other specified soft tissue disorders: Secondary | ICD-10-CM | POA: Diagnosis present

## 2019-07-14 DIAGNOSIS — M79604 Pain in right leg: Secondary | ICD-10-CM

## 2019-07-14 NOTE — Progress Notes (Signed)
Lower extremity venous has been completed.   Preliminary results in [facility] Proc.   Abram Sander 07/14/2019 4:02 PM

## 2019-11-07 IMAGING — CT CT RENAL STONE PROTOCOL
2 of 4 series · 15 of 46 positions shown, 17 images · non-contrast
Comparison: CT of the abdomen and pelvis performed 03/22/2016

CLINICAL DATA: Acute onset of left flank pain.  Nausea.

EXAM:
CT ABDOMEN AND PELVIS WITHOUT CONTRAST
TECHNIQUE: Multidetector CT imaging of the abdomen and pelvis was performed
following the standard protocol without IV contrast.

[Series 2: axial st · axial · 0.93mm/px · z∈[+948,+1373]mm · 12 of 96 slices shown, 14 images]
[im 6/96  soft-tissue]
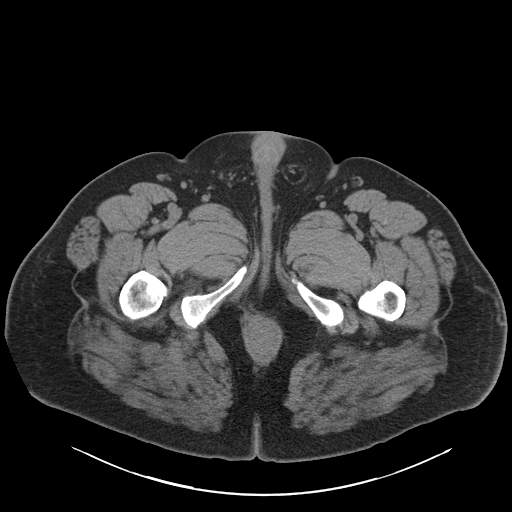
[im 6/96  bone]
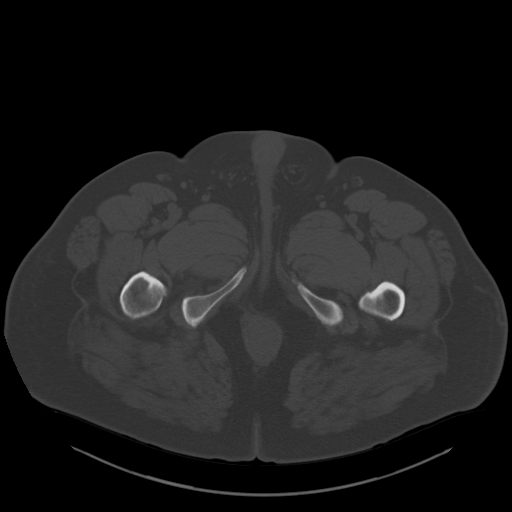
[im 16/96  soft-tissue]
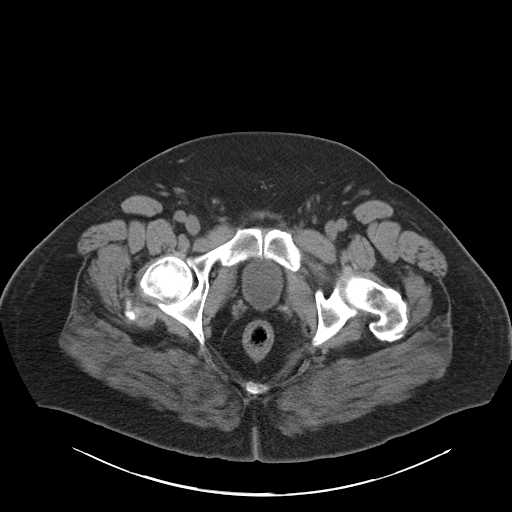
[im 21/96  soft-tissue]
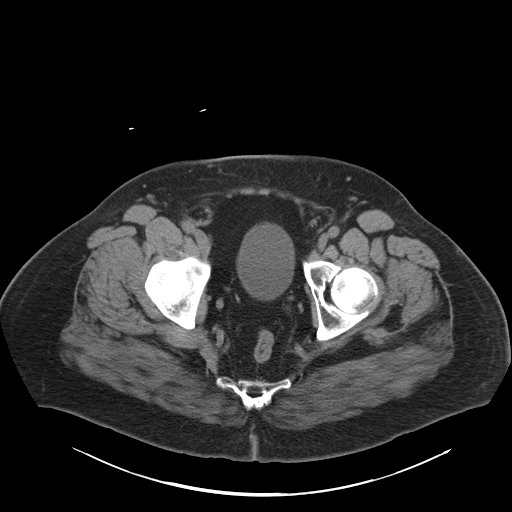
[im 31/96  soft-tissue]
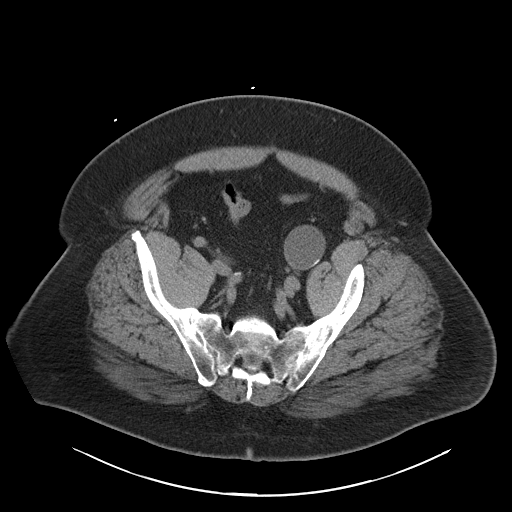
[im 36/96  soft-tissue]
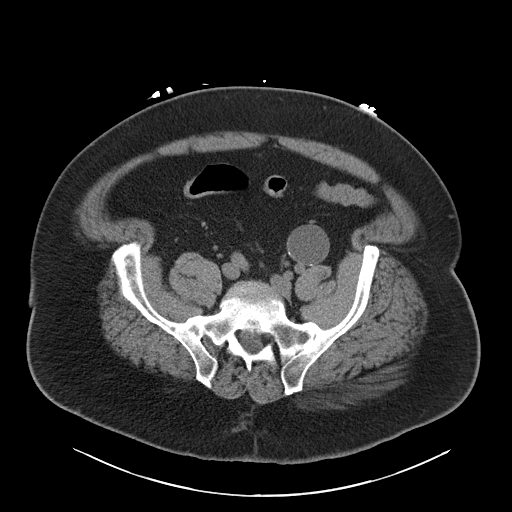
[im 46/96  soft-tissue]
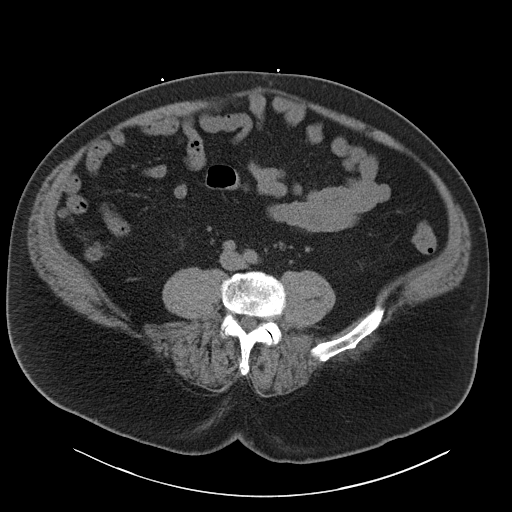
[im 51/96  soft-tissue]
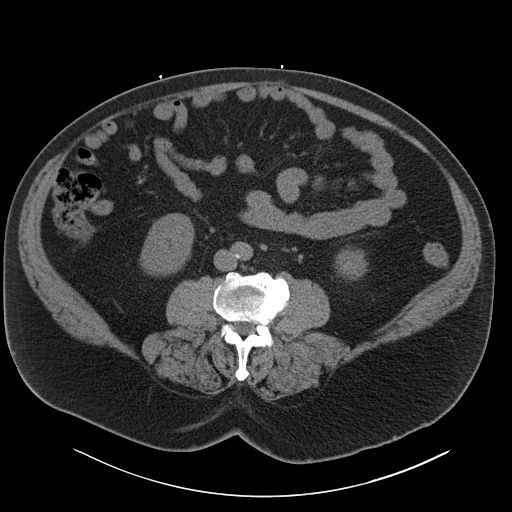
[im 61/96  soft-tissue]
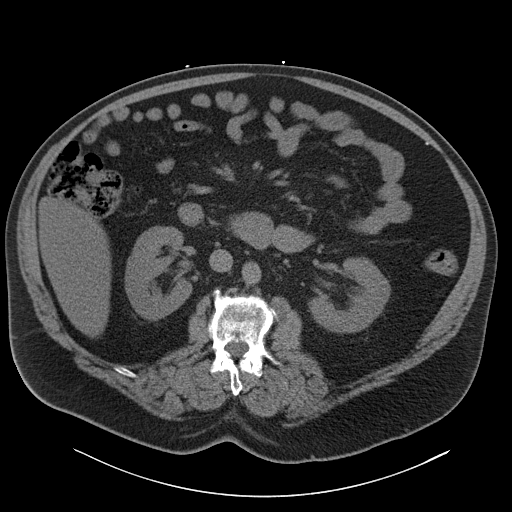
[im 66/96  soft-tissue]
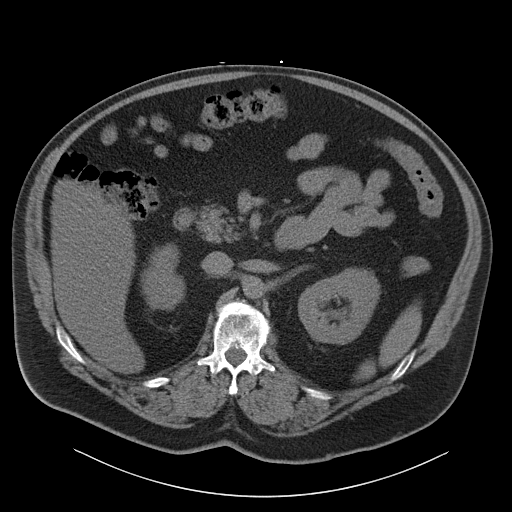
[im 66/96  bone]
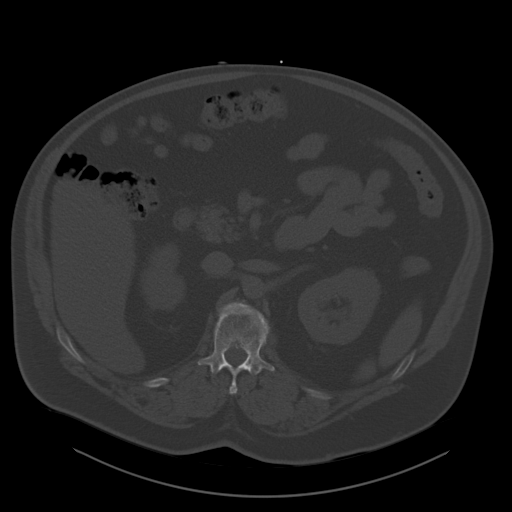
[im 76/96  soft-tissue]
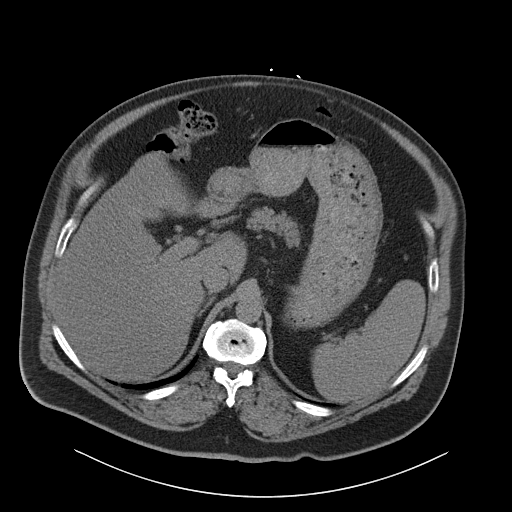
[im 81/96  soft-tissue]
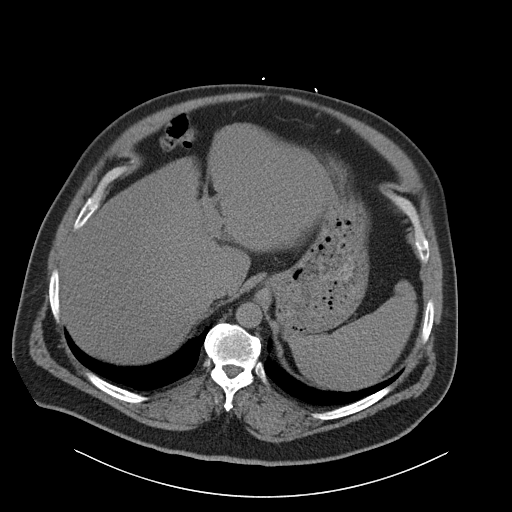
[im 91/96  soft-tissue]
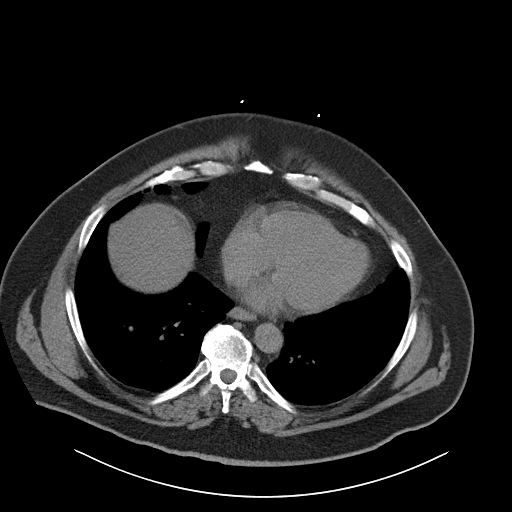

[Series 5: coronal · coronal · 0.90mm/px · 3 of 172 slices shown]
[im 58/172  soft-tissue]
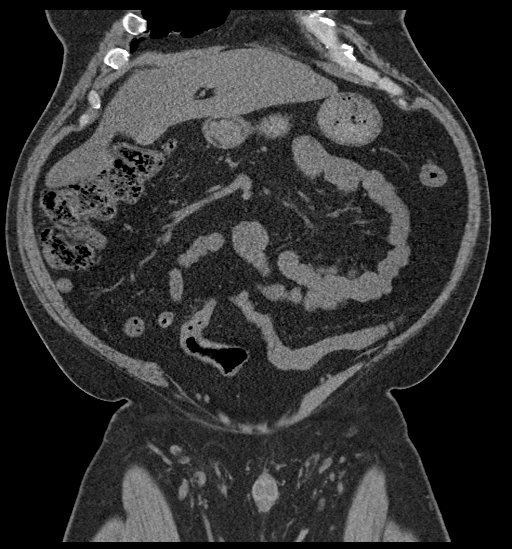
[im 77/172  soft-tissue]
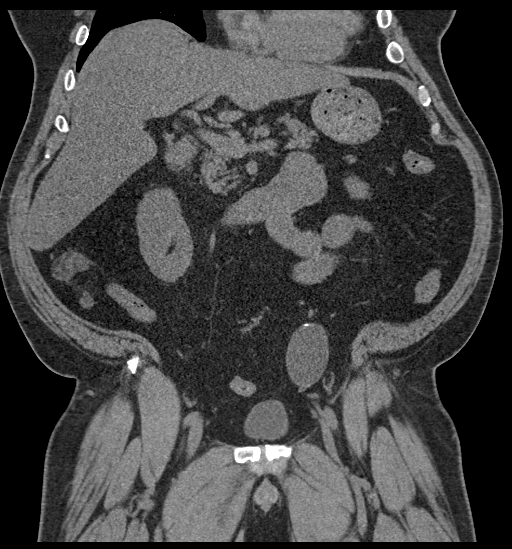
[im 96/172  soft-tissue]
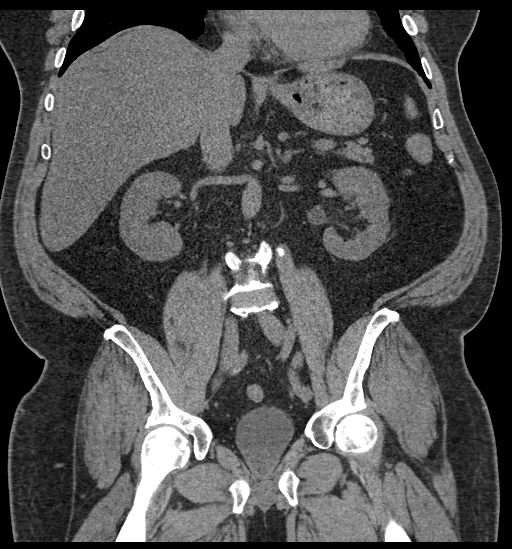

[15 of 46 positions shown; findings below may reference images not displayed]

FINDINGS: Lower chest: The visualized lung bases are grossly clear. The
visualized portions of the mediastinum are unremarkable.

Hepatobiliary: The liver is unremarkable in appearance. The
gallbladder is contracted, with a stone noted in the gallbladder. An
apparent exophytic nodule arising at the gallbladder is relatively
stable in size, measuring 1.5 cm, and is thought to reflect a stone
within a Phrygian cap. The common bile duct is normal in caliber.

Pancreas: The pancreas is within normal limits.

Spleen: The spleen is unremarkable in appearance.

Adrenals/Urinary Tract: The adrenal glands are unremarkable in
appearance. The kidneys are within normal limits. There is no
evidence of hydronephrosis. No renal or ureteral stones are
identified. Mild nonspecific perinephric stranding is noted
bilaterally.

Stomach/Bowel: There is dilatation of the appendix to 1.4 cm in
diameter, with mild soft tissue inflammation about the proximal
appendix, raising concern for mild acute appendicitis. The distal
appendix is only slightly dilated. There is no evidence of
perforation or abscess formation at this time. No free fluid is
seen. No appendicoliths are identified.

The colon is grossly unremarkable in appearance. The small bowel is
largely decompressed and unremarkable in appearance. The stomach is
within normal limits.

Vascular/Lymphatic: Minimal calcification is noted at the distal
abdominal aorta and its branches. The IVC is grossly unremarkable.
No retroperitoneal or pelvic sidewall lymphadenopathy is seen.

Reproductive: The bladder is mildly distended and grossly
unremarkable. The patient is status post prostatectomy. A
peripherally calcified 4.1 cm cystic lesion is again noted at the
left upper pelvis, stable from 6180 and likely benign. This may
reflect a lymphocele.

Other: No additional soft tissue abnormalities are seen.

Musculoskeletal: No acute osseous abnormalities are identified.
Chronic loss of height is again noted at vertebral body L2. The
visualized musculature is unremarkable in appearance.
IMPRESSION: 1. Dilatation of the appendix to 1.4 cm in diameter, with mild soft
tissue inflammation about the proximal appendix, raising concern for
mild appendicitis. Would correlate with the patient's symptoms. No
evidence of perforation or abscess formation at this time. No free
fluid seen. No appendicoliths identified.
2. Cholelithiasis.  Gallbladder otherwise grossly unremarkable.
3. Stable 4.1 cm peripherally calcified cystic lesion at the left
upper pelvis is benign in appearance and may reflect a lymphocele.

These results were called by telephone at the time of interpretation
on 12/04/2017 at [DATE] to Dr. BIELAI RIBIKAUSKAS, who verbally
acknowledged these results.

## 2019-11-27 ENCOUNTER — Encounter (HOSPITAL_COMMUNITY): Payer: Self-pay

## 2019-11-27 ENCOUNTER — Other Ambulatory Visit: Payer: Self-pay

## 2019-11-27 ENCOUNTER — Emergency Department (HOSPITAL_COMMUNITY)
Admission: EM | Admit: 2019-11-27 | Discharge: 2019-11-27 | Disposition: A | Discharge status: Home / Self Care | Payer: Medicare Other | Attending: Emergency Medicine | Admitting: Emergency Medicine

## 2019-11-27 ENCOUNTER — Emergency Department (HOSPITAL_COMMUNITY): Payer: Medicare Other

## 2019-11-27 DIAGNOSIS — Z7984 Long term (current) use of oral hypoglycemic drugs: Secondary | ICD-10-CM | POA: Diagnosis not present

## 2019-11-27 DIAGNOSIS — Z8546 Personal history of malignant neoplasm of prostate: Secondary | ICD-10-CM | POA: Insufficient documentation

## 2019-11-27 DIAGNOSIS — N201 Calculus of ureter: Secondary | ICD-10-CM | POA: Diagnosis not present

## 2019-11-27 DIAGNOSIS — I10 Essential (primary) hypertension: Secondary | ICD-10-CM | POA: Diagnosis not present

## 2019-11-27 DIAGNOSIS — R109 Unspecified abdominal pain: Secondary | ICD-10-CM | POA: Diagnosis present

## 2019-11-27 DIAGNOSIS — Z955 Presence of coronary angioplasty implant and graft: Secondary | ICD-10-CM | POA: Diagnosis not present

## 2019-11-27 DIAGNOSIS — N23 Unspecified renal colic: Secondary | ICD-10-CM | POA: Insufficient documentation

## 2019-11-27 DIAGNOSIS — Z79899 Other long term (current) drug therapy: Secondary | ICD-10-CM | POA: Diagnosis not present

## 2019-11-27 DIAGNOSIS — K219 Gastro-esophageal reflux disease without esophagitis: Secondary | ICD-10-CM | POA: Insufficient documentation

## 2019-11-27 LAB — URINALYSIS, ROUTINE W REFLEX MICROSCOPIC
Bacteria, UA: NONE SEEN
Bilirubin Urine: NEGATIVE
Glucose, UA: NEGATIVE mg/dL
Ketones, ur: NEGATIVE mg/dL
Leukocytes,Ua: NEGATIVE
Nitrite: NEGATIVE
Protein, ur: NEGATIVE mg/dL
RBC / HPF: >50 RBC/hpf — ABNORMAL HIGH (ref 0–5)
Specific Gravity, Urine: 1.023 (ref 1.005–1.030)
pH: 5 (ref 5.0–8.0)

## 2019-11-27 LAB — BASIC METABOLIC PANEL
Anion gap: 10 (ref 5–15)
BUN: 13 mg/dL (ref 6–20)
CO2: 26 mmol/L (ref 22–32)
Calcium: 9.7 mg/dL (ref 8.9–10.3)
Chloride: 102 mmol/L (ref 98–111)
Creatinine, Ser: 0.81 mg/dL (ref 0.61–1.24)
GFR, Estimated: >60 mL/min (ref >60)
Glucose, Bld: 114 mg/dL — ABNORMAL HIGH (ref 70–99)
Potassium: 4.3 mmol/L (ref 3.5–5.1)
Sodium: 138 mmol/L (ref 135–145)

## 2019-11-27 LAB — CBC WITH DIFFERENTIAL/PLATELET
Abs Immature Granulocytes: 0.05 10*3/uL (ref 0.00–0.07)
Basophils Absolute: 0.1 10*3/uL (ref 0.0–0.1)
Basophils Relative: 1 %
Eosinophils Absolute: 0.2 10*3/uL (ref 0.0–0.5)
Eosinophils Relative: 2 %
HCT: 40.5 % (ref 39.0–52.0)
Hemoglobin: 13 g/dL (ref 13.0–17.0)
Immature Granulocytes: 1 %
Lymphocytes Relative: 25 %
Lymphs Abs: 2.5 10*3/uL (ref 0.7–4.0)
MCH: 29.4 pg (ref 26.0–34.0)
MCHC: 32.1 g/dL (ref 30.0–36.0)
MCV: 91.6 fL (ref 80.0–100.0)
Monocytes Absolute: 0.8 10*3/uL (ref 0.1–1.0)
Monocytes Relative: 8 %
Neutro Abs: 6.6 10*3/uL (ref 1.7–7.7)
Neutrophils Relative %: 63 %
Platelets: 215 10*3/uL (ref 150–400)
RBC: 4.42 MIL/uL (ref 4.22–5.81)
RDW: 13.4 % (ref 11.5–15.5)
WBC: 10.2 10*3/uL (ref 4.0–10.5)
nRBC: 0 % (ref 0.0–0.2)

## 2019-11-27 MED ORDER — MORPHINE SULFATE (PF) 4 MG/ML IV SOLN
4.0000 mg | Freq: Once | INTRAVENOUS | Status: AC
  Administered 2019-11-27: 4 mg via INTRAVENOUS
  Filled 2019-11-27: qty 1

## 2019-11-27 MED ORDER — LACTATED RINGERS IV BOLUS
1000.0000 mL | Freq: Once | INTRAVENOUS | Status: AC
  Administered 2019-11-27: 1000 mL via INTRAVENOUS

## 2019-11-27 MED ORDER — KETOROLAC TROMETHAMINE 30 MG/ML IJ SOLN
15.0000 mg | Freq: Once | INTRAMUSCULAR | Status: AC
  Administered 2019-11-27: 15 mg via INTRAVENOUS
  Filled 2019-11-27: qty 1

## 2019-11-27 MED ORDER — ONDANSETRON HCL 4 MG/2ML IJ SOLN
4.0000 mg | Freq: Once | INTRAMUSCULAR | Status: AC
  Administered 2019-11-27: 4 mg via INTRAVENOUS
  Filled 2019-11-27: qty 2

## 2019-11-27 MED ORDER — OXYCODONE-ACETAMINOPHEN 5-325 MG PO TABS: 1.0000 | ORAL_TABLET | Freq: Four times a day (QID) | ORAL | 0 refills | Status: DC | PRN

## 2019-11-27 MED ORDER — TAMSULOSIN HCL 0.4 MG PO CAPS: 0.4000 mg | ORAL_CAPSULE | Freq: Every day | ORAL | 0 refills | Status: DC

## 2019-11-27 MED ORDER — ONDANSETRON 4 MG PO TBDP: 4.0000 mg | ORAL_TABLET | Freq: Three times a day (TID) | ORAL | 0 refills | Status: DC | PRN

## 2019-11-27 NOTE — Discharge Instructions (Addendum)
  Kidney Stone There is evidence of a kidney stone on the left side.  It appears as though it is on its way out.  Some kidney stones can take up to 30 days to pass. Hydration: Hydration is key to helping a kidney stone pass.  Have a goal of half a liter of water every hour or two. Antiinflammatory medications: Take 600 mg of ibuprofen every 6 hours or 440 mg (over the counter dose) to 500 mg (prescription dose) of naproxen every 12 hours for the next 3 days. After this time, these medications may be used as needed for pain. Take these medications with food to avoid upset stomach. Choose only one of these medications, do not take them together. Acetaminophen: Should you continue to have additional pain while taking the ibuprofen or naproxen, you may add in acetaminophen (generic for Tylenol) as needed. Your daily total maximum amount of acetaminophen from all sources should be limited to 4000mg /day for persons without liver problems, or 2000mg /day for those with liver problems. Percocet: May take Percocet (oxycodone-acetaminophen) as needed for severe pain.   Do not drive or perform other dangerous activities while taking this medication as it can cause drowsiness as well as changes in reaction time and judgement.  Please note that each pill of Percocet contains 325 mg of acetaminophen (generic for Tylenol) and the above dosage limits apply. Tamsulosin: This medication is designed to help the stone pass.  Take this medication daily until stone passes. Nausea/vomiting: Use the ondansetron (generic for Zofran) for nausea or vomiting.  This medication may not prevent all vomiting or nausea, but can help facilitate better hydration. Things that can help with nausea/vomiting also include peppermint/menthol candies, vitamin B12, and ginger. Follow-up: Follow-up with the urologist as soon as possible on this matter. Return: Return to the ED for significantly increased pain, difficulty urinating, pain with  urination, fever, uncontrolled vomiting, or any other major concerns.  For prescription assistance, may try using prescription discount sites or apps, such as goodrx.com or Good Rx smart phone app.
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
## 2019-11-27 NOTE — ED Triage Notes (Signed)
Pt presents with c/o left side flank pain that started this morning. Pt also reports hematuria and has a hx of kidney stones.

## 2020-07-21 ENCOUNTER — Emergency Department (HOSPITAL_COMMUNITY): Payer: Medicare Other

## 2020-07-21 ENCOUNTER — Emergency Department (HOSPITAL_COMMUNITY)
Admission: EM | Admit: 2020-07-21 | Discharge: 2020-07-21 | Disposition: A | Discharge status: Home / Self Care | Payer: Medicare Other | Attending: Emergency Medicine | Admitting: Emergency Medicine

## 2020-07-21 DIAGNOSIS — Z79899 Other long term (current) drug therapy: Secondary | ICD-10-CM | POA: Diagnosis not present

## 2020-07-21 DIAGNOSIS — Z8546 Personal history of malignant neoplasm of prostate: Secondary | ICD-10-CM | POA: Diagnosis not present

## 2020-07-21 DIAGNOSIS — K219 Gastro-esophageal reflux disease without esophagitis: Secondary | ICD-10-CM | POA: Insufficient documentation

## 2020-07-21 DIAGNOSIS — Z85828 Personal history of other malignant neoplasm of skin: Secondary | ICD-10-CM | POA: Diagnosis not present

## 2020-07-21 DIAGNOSIS — I10 Essential (primary) hypertension: Secondary | ICD-10-CM | POA: Diagnosis not present

## 2020-07-21 DIAGNOSIS — R109 Unspecified abdominal pain: Secondary | ICD-10-CM | POA: Insufficient documentation

## 2020-07-21 LAB — BASIC METABOLIC PANEL
Anion gap: 9 (ref 5–15)
BUN: 16 mg/dL (ref 6–20)
CO2: 27 mmol/L (ref 22–32)
Calcium: 9.2 mg/dL (ref 8.9–10.3)
Chloride: 105 mmol/L (ref 98–111)
Creatinine, Ser: 0.83 mg/dL (ref 0.61–1.24)
GFR, Estimated: >60 mL/min (ref >60)
Glucose, Bld: 110 mg/dL — ABNORMAL HIGH (ref 70–99)
Potassium: 4.3 mmol/L (ref 3.5–5.1)
Sodium: 141 mmol/L (ref 135–145)

## 2020-07-21 LAB — CBC WITH DIFFERENTIAL/PLATELET
Abs Immature Granulocytes: 0.03 10*3/uL (ref 0.00–0.07)
Basophils Absolute: 0 10*3/uL (ref 0.0–0.1)
Basophils Relative: 0 %
Eosinophils Absolute: 0.2 10*3/uL (ref 0.0–0.5)
Eosinophils Relative: 2 %
HCT: 36.8 % — ABNORMAL LOW (ref 39.0–52.0)
Hemoglobin: 12 g/dL — ABNORMAL LOW (ref 13.0–17.0)
Immature Granulocytes: 0 %
Lymphocytes Relative: 31 %
Lymphs Abs: 2.6 10*3/uL (ref 0.7–4.0)
MCH: 30 pg (ref 26.0–34.0)
MCHC: 32.6 g/dL (ref 30.0–36.0)
MCV: 92 fL (ref 80.0–100.0)
Monocytes Absolute: 0.6 10*3/uL (ref 0.1–1.0)
Monocytes Relative: 7 %
Neutro Abs: 4.9 10*3/uL (ref 1.7–7.7)
Neutrophils Relative %: 60 %
Platelets: 178 10*3/uL (ref 150–400)
RBC: 4 MIL/uL — ABNORMAL LOW (ref 4.22–5.81)
RDW: 13.3 % (ref 11.5–15.5)
WBC: 8.3 10*3/uL (ref 4.0–10.5)
nRBC: 0 % (ref 0.0–0.2)

## 2020-07-21 LAB — URINALYSIS, ROUTINE W REFLEX MICROSCOPIC
Bilirubin Urine: NEGATIVE
Glucose, UA: NEGATIVE mg/dL
Hgb urine dipstick: NEGATIVE
Ketones, ur: NEGATIVE mg/dL
Leukocytes,Ua: NEGATIVE
Nitrite: NEGATIVE
Protein, ur: NEGATIVE mg/dL
Specific Gravity, Urine: 1.02 (ref 1.005–1.030)
pH: 7 (ref 5.0–8.0)

## 2020-07-21 MED ORDER — SODIUM CHLORIDE 0.9 % IV BOLUS
1000.0000 mL | Freq: Once | INTRAVENOUS | Status: AC
  Administered 2020-07-21: 1000 mL via INTRAVENOUS

## 2020-07-21 MED ORDER — HYDROMORPHONE HCL 1 MG/ML IJ SOLN
0.5000 mg | Freq: Once | INTRAMUSCULAR | Status: AC
  Administered 2020-07-21: 0.5 mg via INTRAVENOUS
  Filled 2020-07-21: qty 1

## 2020-07-21 MED ORDER — HYDROCODONE-ACETAMINOPHEN 5-325 MG PO TABS: 1.0000 | ORAL_TABLET | Freq: Four times a day (QID) | ORAL | 0 refills | Status: DC | PRN

## 2020-07-21 MED ORDER — ONDANSETRON HCL 4 MG/2ML IJ SOLN
4.0000 mg | Freq: Once | INTRAMUSCULAR | Status: AC
  Administered 2020-07-21: 4 mg via INTRAVENOUS
  Filled 2020-07-21: qty 2

## 2020-07-21 MED ORDER — MORPHINE SULFATE (PF) 4 MG/ML IV SOLN
4.0000 mg | Freq: Once | INTRAVENOUS | Status: AC
  Administered 2020-07-21: 4 mg via INTRAVENOUS
  Filled 2020-07-21: qty 1

## 2020-07-21 MED ORDER — KETOROLAC TROMETHAMINE 30 MG/ML IJ SOLN
15.0000 mg | Freq: Once | INTRAMUSCULAR | Status: AC
  Administered 2020-07-21: 15 mg via INTRAVENOUS
  Filled 2020-07-21: qty 1

## 2020-07-21 MED ORDER — ONDANSETRON HCL 4 MG PO TABS: 4.0000 mg | ORAL_TABLET | Freq: Four times a day (QID) | ORAL | 0 refills | Status: DC

## 2020-07-21 NOTE — Discharge Instructions (Addendum)
Return for any problem.  ?
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
## 2020-07-21 NOTE — ED Triage Notes (Signed)
Pt has L flank pain that started worsening today. Pt states that it started Thursday but has gotten unbearable today. Hx of kidney stones

## 2020-09-30 ENCOUNTER — Encounter: Payer: Self-pay | Admitting: Gastroenterology

## 2020-10-02 ENCOUNTER — Ambulatory Visit: Payer: Medicare Other | Admitting: Orthopaedic Surgery

## 2020-10-17 ENCOUNTER — Ambulatory Visit: Payer: Medicare Other | Admitting: Orthopaedic Surgery

## 2020-10-17 ENCOUNTER — Telehealth: Payer: Self-pay

## 2020-10-17 ENCOUNTER — Ambulatory Visit: Payer: Self-pay

## 2020-10-17 ENCOUNTER — Other Ambulatory Visit: Payer: Self-pay

## 2020-10-17 ENCOUNTER — Encounter: Payer: Self-pay | Admitting: Orthopaedic Surgery

## 2020-10-17 VITALS — Ht 70.0 in | Wt 238.0 lb

## 2020-10-17 DIAGNOSIS — M170 Bilateral primary osteoarthritis of knee: Secondary | ICD-10-CM | POA: Diagnosis not present

## 2020-10-17 DIAGNOSIS — M25561 Pain in right knee: Secondary | ICD-10-CM | POA: Diagnosis not present

## 2020-10-17 DIAGNOSIS — M25562 Pain in left knee: Secondary | ICD-10-CM | POA: Diagnosis not present

## 2020-10-17 DIAGNOSIS — G8929 Other chronic pain: Secondary | ICD-10-CM
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
## 2020-10-17 NOTE — Telephone Encounter (Signed)
Please precert for bilateral visco injections. This is Dr.Whitfield's patient. Thanks!

## 2020-10-17 NOTE — Telephone Encounter (Signed)
Noted  

## 2020-10-18 ENCOUNTER — Ambulatory Visit: Payer: Medicare Other | Admitting: Orthopaedic Surgery

## 2020-12-10 ENCOUNTER — Telehealth: Payer: Self-pay

## 2020-12-10 NOTE — Telephone Encounter (Signed)
VOB submitted for Synvisc, bilateral knee. BV pending.

## 2020-12-12 ENCOUNTER — Telehealth: Payer: Self-pay

## 2020-12-12 NOTE — Telephone Encounter (Signed)
Called and left a Vm advising patient to CB to schedule for gel injection with Dr. Durward Fortes.  Approved for Synvisc, bilateral knee. [place] Patient will be responsible for 20% OOP. No Co-pay No PA required

## 2020-12-21 ENCOUNTER — Telehealth: Payer: Self-pay | Admitting: Orthopaedic Surgery

## 2020-12-21 NOTE — Telephone Encounter (Signed)
Pt called requesting to cancel gel injections next week and schedule for middle of Jan. 2023. Informed pt that wasn't sure if gel injections will need new approval. Please call pt about this matter at [phone].

## 2020-12-25 ENCOUNTER — Ambulatory Visit: Payer: Medicare Other | Admitting: Orthopaedic Surgery

## 2020-12-25 NOTE — Telephone Encounter (Signed)
Talked with patient and rescheduled appointment for gel injection.  Will resubmit after 01/23/2021

## 2021-01-23 ENCOUNTER — Encounter (HOSPITAL_COMMUNITY): Payer: Self-pay | Admitting: Emergency Medicine

## 2021-01-23 ENCOUNTER — Other Ambulatory Visit: Payer: Self-pay

## 2021-01-23 ENCOUNTER — Emergency Department (HOSPITAL_COMMUNITY)
Admission: EM | Admit: 2021-01-23 | Discharge: 2021-01-23 | Disposition: A | Discharge status: Home / Self Care | Payer: Medicare Other | Attending: Emergency Medicine | Admitting: Emergency Medicine

## 2021-01-23 ENCOUNTER — Emergency Department (HOSPITAL_COMMUNITY): Payer: Medicare Other

## 2021-01-23 DIAGNOSIS — Z85828 Personal history of other malignant neoplasm of skin: Secondary | ICD-10-CM | POA: Diagnosis not present

## 2021-01-23 DIAGNOSIS — Z79899 Other long term (current) drug therapy: Secondary | ICD-10-CM | POA: Diagnosis not present

## 2021-01-23 DIAGNOSIS — R0989 Other specified symptoms and signs involving the circulatory and respiratory systems: Secondary | ICD-10-CM | POA: Insufficient documentation

## 2021-01-23 DIAGNOSIS — R001 Bradycardia, unspecified: Secondary | ICD-10-CM | POA: Insufficient documentation

## 2021-01-23 DIAGNOSIS — F419 Anxiety disorder, unspecified: Secondary | ICD-10-CM | POA: Diagnosis not present

## 2021-01-23 DIAGNOSIS — R0789 Other chest pain: Secondary | ICD-10-CM

## 2021-01-23 DIAGNOSIS — I10 Essential (primary) hypertension: Secondary | ICD-10-CM | POA: Diagnosis not present

## 2021-01-23 DIAGNOSIS — Z20822 Contact with and (suspected) exposure to covid-19: Secondary | ICD-10-CM | POA: Diagnosis not present

## 2021-01-23 DIAGNOSIS — R0981 Nasal congestion: Secondary | ICD-10-CM | POA: Diagnosis not present

## 2021-01-23 DIAGNOSIS — Z8546 Personal history of malignant neoplasm of prostate: Secondary | ICD-10-CM | POA: Diagnosis not present

## 2021-01-23 DIAGNOSIS — R519 Headache, unspecified: Secondary | ICD-10-CM | POA: Insufficient documentation

## 2021-01-23 LAB — CBC
HCT: 36.7 % — ABNORMAL LOW (ref 39.0–52.0)
Hemoglobin: 12.4 g/dL — ABNORMAL LOW (ref 13.0–17.0)
MCH: 31.3 pg (ref 26.0–34.0)
MCHC: 33.8 g/dL (ref 30.0–36.0)
MCV: 92.7 fL (ref 80.0–100.0)
Platelets: 189 10*3/uL (ref 150–400)
RBC: 3.96 MIL/uL — ABNORMAL LOW (ref 4.22–5.81)
RDW: 12.7 % (ref 11.5–15.5)
WBC: 8.8 10*3/uL (ref 4.0–10.5)
nRBC: 0 % (ref 0.0–0.2)

## 2021-01-23 LAB — TROPONIN I (HIGH SENSITIVITY)
Troponin I (High Sensitivity): 5 ng/L (ref <18)
Troponin I (High Sensitivity): 5 ng/L (ref <18)

## 2021-01-23 LAB — BASIC METABOLIC PANEL
Anion gap: 7 (ref 5–15)
BUN: 21 mg/dL — ABNORMAL HIGH (ref 6–20)
CO2: 26 mmol/L (ref 22–32)
Calcium: 8.6 mg/dL — ABNORMAL LOW (ref 8.9–10.3)
Chloride: 104 mmol/L (ref 98–111)
Creatinine, Ser: 0.73 mg/dL (ref 0.61–1.24)
GFR, Estimated: >60 mL/min (ref >60)
Glucose, Bld: 105 mg/dL — ABNORMAL HIGH (ref 70–99)
Potassium: 3.7 mmol/L (ref 3.5–5.1)
Sodium: 137 mmol/L (ref 135–145)

## 2021-01-23 LAB — RESP PANEL BY RT-PCR (FLU A&B, COVID) ARPGX2
Influenza A by PCR: NEGATIVE
Influenza B by PCR: NEGATIVE
SARS Coronavirus 2 by RT PCR: NEGATIVE

## 2021-01-23 MED ORDER — ASPIRIN 325 MG PO TABS
325.0000 mg | ORAL_TABLET | Freq: Every day | ORAL | Status: DC
  Administered 2021-01-23: 325 mg via ORAL
  Filled 2021-01-23: qty 1

## 2021-01-23 MED ORDER — SODIUM CHLORIDE 0.9 % IV BOLUS
1000.0000 mL | Freq: Once | INTRAVENOUS | Status: AC
  Administered 2021-01-23: 1000 mL via INTRAVENOUS

## 2021-01-23 MED ORDER — KETOROLAC TROMETHAMINE 15 MG/ML IJ SOLN
30.0000 mg | Freq: Once | INTRAMUSCULAR | Status: AC
  Administered 2021-01-23: 30 mg via INTRAVENOUS
  Filled 2021-01-23: qty 2

## 2021-01-23 NOTE — Discharge Instructions (Addendum)

## 2021-01-23 NOTE — ED Triage Notes (Signed)
Patient complains of constant L chest pain, radiating down his L arm intermittently. BP at home 155/94, normally 125/80. Complains of HA, denies N/V, sweating or SOB.

## 2021-01-23 NOTE — ED Provider Triage Note (Signed)
Emergency Medicine Provider Triage Evaluation Note  ASAR EVILSIZER , a 61 y.o. male  was evaluated in triage.  Pt complains of chest pain.  States that it has been ongoing for the last 2 days.  Is located on the left side of his chest and radiates down his left arm.  States it is worse with exertion.  Also states that he feels his blood pressure is higher than normal, although he has been taking his blood pressure medication as prescribed.  He also endorses associated headaches.  Denies fevers, chills, nausea, vomiting, or shortness of breath.  Denies any history of heart problems.  Review of Systems  Positive:  Negative: See above  Physical Exam  BP (!) 156/78 (BP Location: Left Arm)    Pulse 64    Temp 98 F (36.7 C) (Oral)    Resp 18    Ht 5\' 10"  (1.778 m)    Wt 113.4 kg    SpO2 100%    BMI 35.87 kg/m  Gen:   Awake, no distress   Resp:  Normal effort  MSK:   Moves extremities without difficulty  Other:    Medical Decision Making  Medically screening exam initiated at 9:27 AM.  Appropriate orders placed.  MASAMI PLATA was informed that the remainder of the evaluation will be completed by another provider, this initial triage assessment does not replace that evaluation, and the importance of remaining in the ED until their evaluation is complete.     Bud Face, PA-C 01/23/21 [phone]
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
## 2021-02-01 ENCOUNTER — Telehealth: Payer: Self-pay

## 2021-02-01 NOTE — Telephone Encounter (Signed)
VOB submitted for Euflexxa, bilateral knee. BV pending.

## 2021-02-08 ENCOUNTER — Telehealth: Payer: Self-pay

## 2021-02-08 NOTE — Telephone Encounter (Signed)
Submitted for Synvisc due to insurance for Euflexxa.  Approved for Synvisc, bilateral knee. [place] Patient will be responsible for 20% OOP. Co-pay of $20.00 required, may be per visit No PA required  Appt. 02/14/2021 with Dr. Durward Fortes

## 2021-02-12 ENCOUNTER — Ambulatory Visit: Payer: Medicare Other | Admitting: Orthopaedic Surgery

## 2021-02-14 ENCOUNTER — Encounter: Payer: Self-pay | Admitting: Orthopaedic Surgery

## 2021-02-14 ENCOUNTER — Ambulatory Visit: Payer: Medicare Other | Admitting: Orthopaedic Surgery

## 2021-02-14 ENCOUNTER — Other Ambulatory Visit: Payer: Self-pay

## 2021-02-14 VITALS — Ht 70.0 in | Wt 250.0 lb

## 2021-02-14 DIAGNOSIS — M170 Bilateral primary osteoarthritis of knee: Secondary | ICD-10-CM

## 2021-02-14 MED ORDER — HYLAN G-F 20 16 MG/2ML IX SOSY
16.0000 mg | PREFILLED_SYRINGE | INTRA_ARTICULAR | Status: AC | PRN
  Administered 2021-02-14: 16 mg via INTRA_ARTICULAR
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
## 2021-02-15 ENCOUNTER — Ambulatory Visit (INDEPENDENT_AMBULATORY_CARE_PROVIDER_SITE_OTHER): Payer: Medicare Other | Admitting: Internal Medicine

## 2021-02-15 ENCOUNTER — Encounter: Payer: Self-pay | Admitting: Internal Medicine

## 2021-02-15 VITALS — BP 116/60 | HR 72 | Ht 70.0 in | Wt 255.0 lb

## 2021-02-15 DIAGNOSIS — I200 Unstable angina: Secondary | ICD-10-CM | POA: Diagnosis not present

## 2021-02-15 DIAGNOSIS — I152 Hypertension secondary to endocrine disorders: Secondary | ICD-10-CM | POA: Insufficient documentation

## 2021-02-15 DIAGNOSIS — E1159 Type 2 diabetes mellitus with other circulatory complications: Secondary | ICD-10-CM | POA: Diagnosis not present

## 2021-02-15 DIAGNOSIS — R072 Precordial pain: Secondary | ICD-10-CM

## 2021-02-15 MED ORDER — METOPROLOL TARTRATE 100 MG PO TABS: ORAL_TABLET | ORAL | 0 refills | Status: DC

## 2021-02-15 NOTE — Patient Instructions (Signed)
Medication Instructions:  Your physician recommends that you continue on your current medications as directed. Please refer to the Current Medication list given to you today.  *If you need a refill on your cardiac medications before your next appointment, please call your pharmacy*   Lab Work: TODAY:  BMET  If you have labs (blood work) drawn today and your tests are completely normal, you will receive your results only by: [place] (if you have MyChart) OR A paper copy in the mail If you have any lab test that is abnormal or we need to change your treatment, we will call you to review the results.   Testing/Procedures:  Your physician has requested that you have cardiac CT. Cardiac computed tomography (CT) is a painless test that uses an x-ray machine to take clear, detailed pictures of your heart. For further information please visit HugeFiesta.[email or web address]. Please follow instruction sheet as BELOW    Your cardiac CT will be scheduled at one of the below locations:   [facility] [address] [place], [place] [address] [phone]  At [facility], please arrive at the [facility] main entrance (entrance A) of [facility] 30 minutes prior to test start time. You can use the FREE valet parking offered at the main entrance (encouraged to control the heart rate for the test) Proceed to the [facility] Radiology Department (first floor) to check-in and test prep.  Please follow these instructions carefully (unless otherwise directed):  Hold all erectile dysfunction medications at least 3 days (72 hrs) prior to test.  On the Night Before the Test: Be sure to Drink plenty of water. Do not consume any caffeinated/decaffeinated beverages or chocolate 12 hours prior to your test. Do not take any antihistamines 12 hours prior to your test.   On the Day of the Test: Drink plenty of water until 1 hour prior to the test. Do not eat any food 4 hours  prior to the test. You may take your regular medications prior to the test.  Take metoprolol (Lopressor) 100 mg two hours prior to test. THIS HAS BEEN SENT TO YOUR PHARMACY    After the Test: Drink plenty of water. After receiving IV contrast, you may experience a mild flushed feeling. This is normal. On occasion, you may experience a mild rash up to 24 hours after the test. This is not dangerous. If this occurs, you can take Benadryl 25 mg and increase your fluid intake. If you experience trouble breathing, this can be serious. If it is severe call 911 IMMEDIATELY. If it is mild, please call our office. If you take any of these medications: Glipizide/Metformin, Avandament, Glucavance, please do not take 48 hours after completing test unless otherwise instructed.  We will call to schedule your test 2-4 weeks out understanding that some insurance companies will need an authorization prior to the service being performed.   For non-scheduling related questions, please contact the cardiac imaging nurse navigator should you have any questions/concerns: Marchia Bond, Cardiac Imaging Nurse Navigator Gordy Clement, Cardiac Imaging Nurse Navigator [place] [facility] Direct Office Dial: [phone]   For scheduling needs, including cancellations and rescheduling, please call [place], [phone].     Follow-Up: At [facility], you and your health needs are our priority.  As part of our continuing mission to provide you with exceptional heart care, we have created designated Provider Care Teams.  These Care Teams include your primary Cardiologist (physician) and Advanced Practice Providers (  APPs -  Physician Assistants and Nurse Practitioners) who all work together to provide you with the care you need, when you need it.  We recommend signing up for the patient portal called "MyChart".  Sign up information is provided on this After Visit Summary.  MyChart is used to connect  with patients for Virtual Visits (Telemedicine).  Patients are able to view lab/test results, encounter notes, upcoming appointments, etc.  Non-urgent messages can be sent to your provider as well.   To learn more about what you can do with MyChart, go to [email or web address].    Your next appointment:   3 month(s)  The format for your next appointment:   In Person  Provider:   Werner Lean, MD     Other Instructions

## 2021-02-16 LAB — BASIC METABOLIC PANEL
BUN/Creatinine Ratio: 18 (ref 9–20)
BUN: 14 mg/dL (ref 6–24)
CO2: 25 mmol/L (ref 20–29)
Calcium: 9.3 mg/dL (ref 8.7–10.2)
Chloride: 105 mmol/L (ref 96–106)
Creatinine, Ser: 0.78 mg/dL (ref 0.76–1.27)
Glucose: 84 mg/dL (ref 70–99)
Potassium: 3.6 mmol/L (ref 3.5–5.2)
Sodium: 145 mmol/L — ABNORMAL HIGH (ref 134–144)
eGFR: 103 mL/min/{1.73_m2} (ref >59)

## 2021-02-21 ENCOUNTER — Encounter: Payer: Self-pay | Admitting: Orthopaedic Surgery

## 2021-02-21 ENCOUNTER — Ambulatory Visit (INDEPENDENT_AMBULATORY_CARE_PROVIDER_SITE_OTHER): Payer: Medicare Other | Admitting: Orthopaedic Surgery

## 2021-02-21 ENCOUNTER — Other Ambulatory Visit: Payer: Self-pay

## 2021-02-21 DIAGNOSIS — M1711 Unilateral primary osteoarthritis, right knee: Secondary | ICD-10-CM

## 2021-02-21 DIAGNOSIS — M170 Bilateral primary osteoarthritis of knee: Secondary | ICD-10-CM

## 2021-02-21 DIAGNOSIS — M1712 Unilateral primary osteoarthritis, left knee: Secondary | ICD-10-CM

## 2021-02-21 MED ORDER — HYLAN G-F 20 16 MG/2ML IX SOSY
16.0000 mg | PREFILLED_SYRINGE | INTRA_ARTICULAR | Status: AC | PRN
  Administered 2021-02-21: 16 mg via INTRA_ARTICULAR
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
## 2021-02-25 ENCOUNTER — Telehealth: Payer: Self-pay | Admitting: Gastroenterology

## 2021-02-25 NOTE — Telephone Encounter (Signed)
Hi Dr. Havery Moros,   Patient called requesting a transfer of care over to D.O.D. which is Dr. Ardis Hughs. Said the last time he had a colonoscopy he had to go to the ED due to an infection.  Please advise on scheduling.  Thanks

## 2021-02-25 NOTE — Telephone Encounter (Signed)
I reviewed his chart. He had a colonoscopy with me in 2017, was admitted a day after the procedure with leukocytosis and pain but no clear cause found despite labs and a few CT scans. He was admitted for IV antibiotics and resolved. It was not clear at the time what had caused this.  Up to him, I'm happy to perform his procedure but if he does not want me to perform his colonoscopy given his course after his last exam, it's okay if he has it done with one of my partners.

## 2021-02-26 ENCOUNTER — Encounter: Payer: Self-pay | Admitting: Gastroenterology

## 2021-02-28 ENCOUNTER — Encounter: Payer: Self-pay | Admitting: Physician Assistant

## 2021-02-28 ENCOUNTER — Ambulatory Visit (INDEPENDENT_AMBULATORY_CARE_PROVIDER_SITE_OTHER): Payer: Medicare Other | Admitting: Physician Assistant

## 2021-02-28 ENCOUNTER — Other Ambulatory Visit: Payer: Self-pay

## 2021-02-28 DIAGNOSIS — M1711 Unilateral primary osteoarthritis, right knee: Secondary | ICD-10-CM

## 2021-02-28 DIAGNOSIS — M170 Bilateral primary osteoarthritis of knee: Secondary | ICD-10-CM

## 2021-02-28 DIAGNOSIS — M1712 Unilateral primary osteoarthritis, left knee: Secondary | ICD-10-CM

## 2021-02-28 MED ORDER — HYLAN G-F 20 16 MG/2ML IX SOSY
16.0000 mg | PREFILLED_SYRINGE | INTRA_ARTICULAR | Status: AC | PRN
  Administered 2021-02-28: 16 mg via INTRA_ARTICULAR
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
## 2021-03-05 ENCOUNTER — Other Ambulatory Visit: Payer: Self-pay | Admitting: Internal Medicine

## 2021-03-05 ENCOUNTER — Telehealth (HOSPITAL_COMMUNITY): Payer: Self-pay | Admitting: *Deleted

## 2021-03-05 NOTE — Telephone Encounter (Signed)
Attempted to call patient regarding upcoming cardiac CT appointment. °Left message on voicemail with name and callback number ° °Adreanna Fickel RN Navigator Cardiac Imaging °[facility] °[phone] Office °[phone] Cell ° °

## 2021-03-06 ENCOUNTER — Telehealth (HOSPITAL_COMMUNITY): Payer: Self-pay | Admitting: *Deleted

## 2021-03-06 NOTE — Telephone Encounter (Signed)
Patient returning call regarding upcoming cardiac imaging study; pt verbalizes understanding of appt date/time, parking situation and where to check in, pre-test NPO status and medications ordered, and verified current allergies; name and call back number provided for further questions should they arise  David Clement RN Navigator Cardiac Imaging [facility] [phone] office [phone] cell  Patient to hold his daily BP medications and will take 100mg  metoprolol tartrate two hours prior to his cardiac CT scan. He is aware to arrive at 9:30am for his 10am scan.

## 2021-03-07 ENCOUNTER — Encounter (HOSPITAL_COMMUNITY): Payer: Self-pay

## 2021-03-07 ENCOUNTER — Encounter: Payer: Self-pay | Admitting: Internal Medicine

## 2021-03-07 ENCOUNTER — Ambulatory Visit (HOSPITAL_COMMUNITY)
Admission: RE | Admit: 2021-03-07 | Discharge: 2021-03-07 | Disposition: A | Discharge status: Home / Self Care | Payer: Medicare Other | Source: Ambulatory Visit | Attending: Internal Medicine | Admitting: Internal Medicine

## 2021-03-07 ENCOUNTER — Other Ambulatory Visit: Payer: Self-pay

## 2021-03-07 DIAGNOSIS — I200 Unstable angina: Secondary | ICD-10-CM | POA: Insufficient documentation

## 2021-03-07 DIAGNOSIS — I700 Atherosclerosis of aorta: Secondary | ICD-10-CM

## 2021-03-07 DIAGNOSIS — R072 Precordial pain: Secondary | ICD-10-CM | POA: Diagnosis not present

## 2021-03-07 IMAGING — CT CT HEART MORP W/ CTA COR W/ SCORE W/ CA W/CM &/OR W/O CM
4 of 7 series · 8 of 20 positions shown, 9 images · IV contrast (APPLIED)
Comparison: 01/23/2021 chest radiograph.  11/16/2015 CTA chest.
COMPARISON: 01/23/2021 chest radiograph.  11/16/2015 CTA chest.

Addendum:
EXAM:
OVER-READ INTERPRETATION  CT CHEST

The following report is an over-read performed by radiologist Dr.
Renesme Bisente [REDACTED] on 03/07/2021. This over-read
does not include interpretation of cardiac or coronary anatomy or
pathology. The coronary CTA interpretation by the cardiologist is
attached.
CLINICAL DATA: 60 Year old White Male
Cardiac/Coronary  CTA
TECHNIQUE: The patient was scanned on a [employer] Force scanner.

[Series 6: best diast · axial · 0.42mm/px · z∈[-184,-147]mm · 2 of 274 slices shown, 3 images]
[im 92/274  vessel]
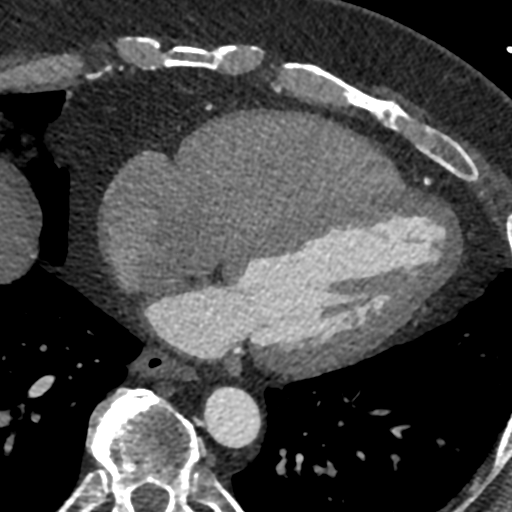
[im 92/274  lung]
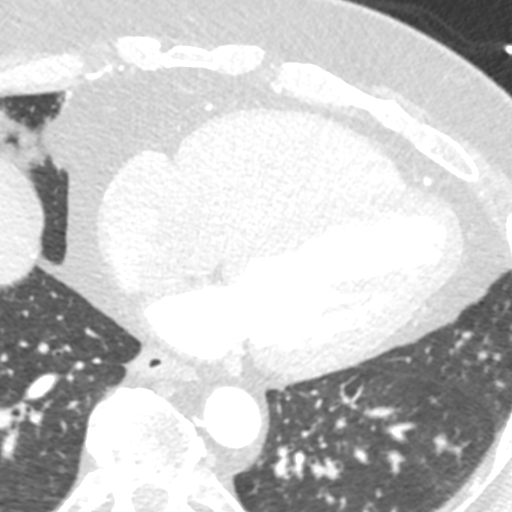
[im 183/274  vessel]
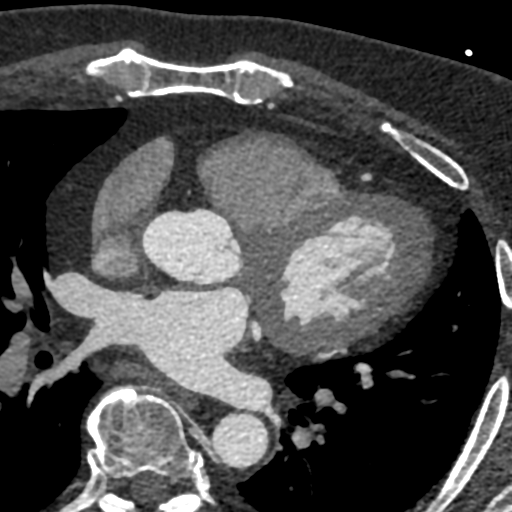

[Series 7: best syst · axial · 0.42mm/px · z∈[-185,-148]mm · 2 of 283 slices shown]
[im 95/283  vessel]
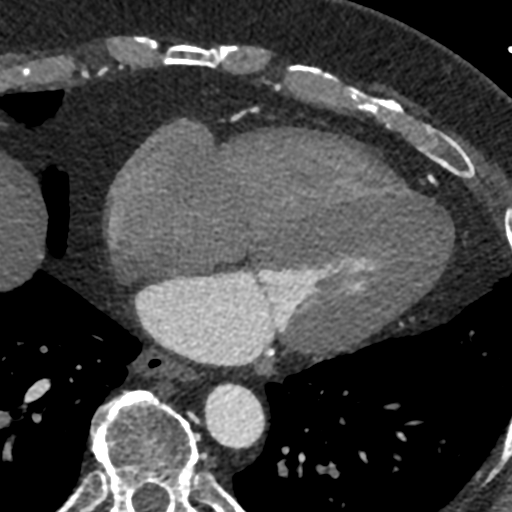
[im 189/283  vessel]
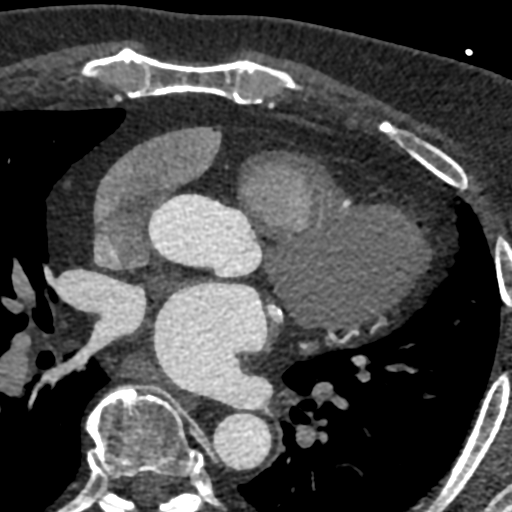

[Series 8: ts diast sharp · axial · 0.42mm/px · z∈[-186,-148]mm · 2 of 282 slices shown]
[im 94/282  lung]
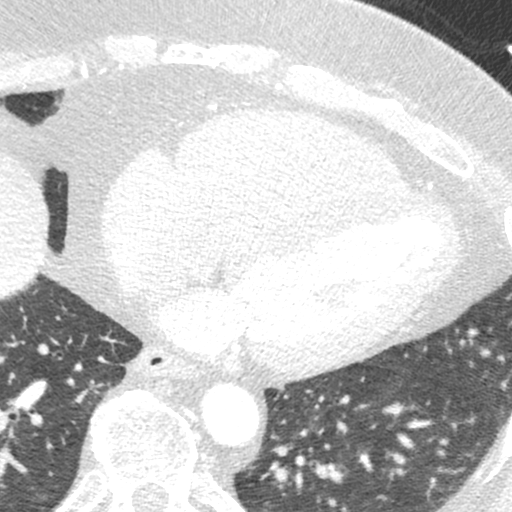
[im 188/282  lung]
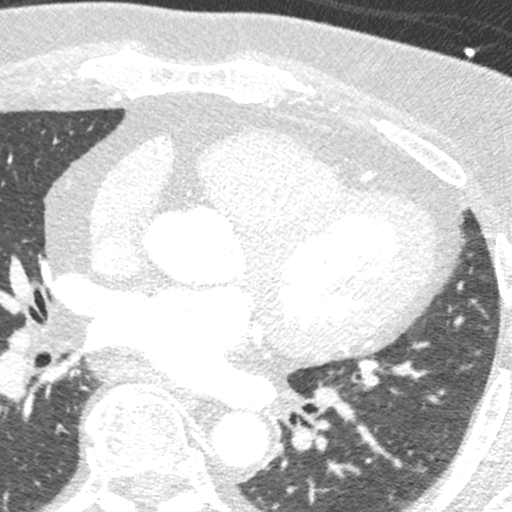

[Series 9: ts syst sharp · axial · 0.42mm/px · z∈[-185,-148]mm · 2 of 283 slices shown]
[im 95/283  lung]
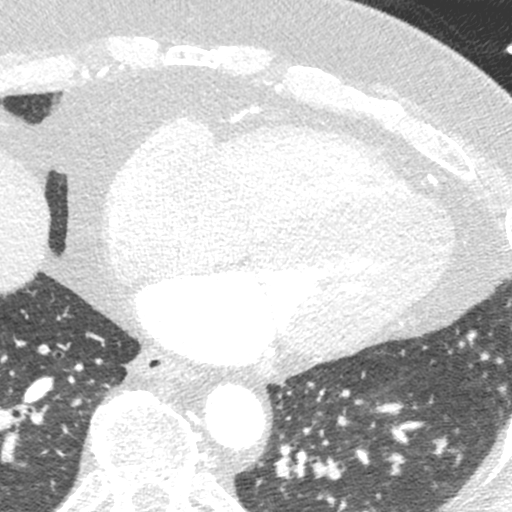
[im 189/283  lung]
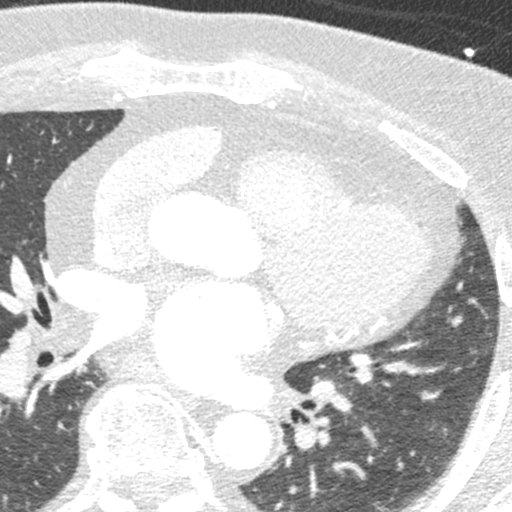

[8 of 20 positions shown; findings below may reference images not displayed]

FINDINGS: Vascular: Normal aortic caliber. No central pulmonary embolism, on
this non-dedicated study.

Mediastinum/Nodes: No imaged thoracic adenopathy.

Lungs/Pleura: No pleural fluid.  Clear imaged lungs.

Upper Abdomen: Normal imaged portions of the liver, spleen.

Musculoskeletal: At least 1 remote right rib fracture. Moderate
thoracic spondylosis. Remote sternal body fracture.
IMPRESSION: No acute findings in the imaged extracardiac chest.
FINDINGS: Scan was triggered in the descending thoracic aorta. Axial
non-contrast 3 mm slices were carried out through the heart. The
data set was analyzed on a dedicated work station and scored using
the Agatson method. Gantry rotation speed was 250 msecs and
collimation was .6 mm. 0.8 mg of sl NTG was given. The 3D data set
was reconstructed in 5% intervals of the 67-82 % of the R-R cycle.
Diastolic phases were analyzed on a dedicated work station using
MPR, MIP and VRT modes. The patient received 95 cc of contrast.

Aorta:  Normal size.  Aortic atherosclerosis.  No dissection.

Main Pulmonary Artery: Normal size of the pulmonary artery.

Aortic Valve:  Tri-leaflet.  Aortic valve calcium score 114.

Coronary Arteries:  Normal coronary origin.  Left dominance.

Coronary Calcium Score:

Left main: 0

Left anterior descending artery: 769

Left circumflex artery: 169

Right coronary artery: 78

Total: 0708

Percentile: 97th for age, sex, and race matched control.

RCA is a small non-dominant artery that gives rise to R-PLA. Mild
calcified plaques in the proximal and mid RCA. Minimal calcified
plaques in the distal RCA.

Left main is a large artery that gives rise to LAD and LCX arteries.
There is no significant plaque.

LAD is a large vessel that gives rise to two diagonal vessel. Mild
mixed plaques in the proximal, mid LAD, and distal LAD. D1 is a
small vessel.

LCX is a dominant artery that gives rise to one an L-PDA and
multiple OM vessels. Mild mixed plaques in the proximal and mid LCX.
Minimal mixed plaques distal LAD and PDA.

Other findings:

Normal variant pulmonary vein drainage into the left atrium-presence
of a right middle pulmonary vein.

Normal left atrial appendage without a thrombus.

Extra-cardiac findings: See attached radiology report for
non-cardiac structures.
IMPRESSION: 1. Coronary calcium score of 0708. This was 97th percentile for age,
sex, and race matched control.

2. Normal coronary origin with left dominance.

3. Aortic atherosclerosis.

4. CAD-RADS 2. Mild non-obstructive CAD (25-49%). Consider
non-atherosclerotic causes of chest pain. Consider preventive
therapy and risk factor modification.

RECOMMENDATIONS:



If CAC = 0, it is reasonable to withhold statin therapy and reassess
in 5 to 10 years, as long as higher risk conditions are absent
(diabetes mellitus, family history of premature CHD in first degree
relatives (males <55 years; females <65 years), cigarette smoking,
LDL >=190 mg/dL or other independent risk factors).

If CAC is 1 to 99, it is reasonable to initiate statin therapy for
patients >=55 years of age.

If CAC is >=100 or >=75th percentile, it is reasonable to initiate
statin therapy at any age.

Cardiology referral should be considered for patients with CAC
scores =400 or >=75th percentile.

*2730 AHA/ACC/AACVPR/AAPA/ABC/EGGERT/TOMBE/SAID LAG/[place]/RAGEM/GIORGI
Guideline on the Management of Blood Cholesterol: A Report of the
American College of Cardiology/American Heart Association Task Force
on Clinical Practice Guidelines. J Am Coll Cardiol.
5430;73(24):3002-3109.

*** End of Addendum ***
EXAM:
OVER-READ INTERPRETATION  CT CHEST

The following report is an over-read performed by radiologist Dr.
Renesme Bisente [REDACTED] on 03/07/2021. This over-read
does not include interpretation of cardiac or coronary anatomy or
pathology. The coronary CTA interpretation by the cardiologist is
attached.
FINDINGS: Vascular: Normal aortic caliber. No central pulmonary embolism, on
this non-dedicated study.

Mediastinum/Nodes: No imaged thoracic adenopathy.

Lungs/Pleura: No pleural fluid.  Clear imaged lungs.

Upper Abdomen: Normal imaged portions of the liver, spleen.

Musculoskeletal: At least 1 remote right rib fracture. Moderate
thoracic spondylosis. Remote sternal body fracture.
IMPRESSION: No acute findings in the imaged extracardiac chest.

## 2021-03-07 MED ORDER — IOHEXOL 350 MG/ML SOLN
95.0000 mL | Freq: Once | INTRAVENOUS | Status: AC | PRN
  Administered 2021-03-07: 95 mL via INTRAVENOUS

## 2021-03-07 MED ORDER — NITROGLYCERIN 0.4 MG SL SUBL
0.8000 mg | SUBLINGUAL_TABLET | Freq: Once | SUBLINGUAL | Status: AC
  Administered 2021-03-07: 0.8 mg via SUBLINGUAL

## 2021-03-07 MED ORDER — NITROGLYCERIN 0.4 MG SL SUBL
SUBLINGUAL_TABLET | SUBLINGUAL | Status: AC
  Filled 2021-03-07: qty 2

## 2021-03-07 NOTE — Discharge Instructions (Signed)
CRT scan completed. Tolerated well. D/C home ambulatory, awake and alert. In no distress

## 2021-03-08 ENCOUNTER — Telehealth: Payer: Self-pay

## 2021-03-08 MED ORDER — ATORVASTATIN CALCIUM 80 MG PO TABS: 80.0000 mg | ORAL_TABLET | Freq: Every day | ORAL | 3 refills | Status: DC

## 2021-03-08 MED ORDER — ASPIRIN EC 81 MG PO TBEC: 81.0000 mg | DELAYED_RELEASE_TABLET | Freq: Every day | ORAL | 3 refills | Status: DC

## 2021-03-08 NOTE — Telephone Encounter (Signed)
-----   Message from Werner Lean, MD sent at 03/08/2021  8:06 AM EST ----- Mild Non Obstructive CAD - Will start ASA 81 mg PO daily - will increase atorvastatin 80 mg PO daily; he had issues with simvastatin before so if he has new symptoms on higher dose, we will return to his 40 mg and we will add Zetia instead - He should be reasonable for non-cardiac surgery, he was potentially getting some orthopedia work done and we can assist with cardiac risk stratification  ----- Message ----- From: Interface, Rad Results In Sent: 03/07/2021   3:00 PM EST To: Werner Lean, MD

## 2021-03-08 NOTE — Telephone Encounter (Signed)
The patient has been notified of the result and verbalized understanding.  All questions (if any) were answered. Precious Gilding, RN 03/08/2021 3:36 PM

## 2021-03-20 ENCOUNTER — Other Ambulatory Visit: Payer: Self-pay

## 2021-03-20 ENCOUNTER — Ambulatory Visit (AMBULATORY_SURGERY_CENTER): Payer: Medicare Other | Admitting: *Deleted

## 2021-03-20 VITALS — Ht 70.0 in | Wt 252.0 lb

## 2021-03-20 DIAGNOSIS — Z86010 Personal history of colonic polyps: Secondary | ICD-10-CM

## 2021-03-20 DIAGNOSIS — Z800 Family history of malignant neoplasm of digestive organs: Secondary | ICD-10-CM

## 2021-03-20 NOTE — Progress Notes (Signed)

## 2021-03-29 ENCOUNTER — Encounter: Payer: Self-pay | Admitting: Gastroenterology

## 2021-04-03 ENCOUNTER — Encounter: Payer: Self-pay | Admitting: Gastroenterology

## 2021-04-03 ENCOUNTER — Other Ambulatory Visit: Payer: Self-pay | Admitting: Gastroenterology

## 2021-04-03 ENCOUNTER — Ambulatory Visit (AMBULATORY_SURGERY_CENTER): Payer: Medicare Other | Admitting: Gastroenterology

## 2021-04-03 VITALS — BP 110/74 | HR 65 | Temp 98.2°F | Resp 16 | Ht 70.0 in | Wt 252.0 lb

## 2021-04-03 DIAGNOSIS — Z800 Family history of malignant neoplasm of digestive organs: Secondary | ICD-10-CM | POA: Diagnosis not present

## 2021-04-03 DIAGNOSIS — Z86010 Personal history of colonic polyps: Secondary | ICD-10-CM | POA: Diagnosis not present

## 2021-04-03 DIAGNOSIS — D128 Benign neoplasm of rectum: Secondary | ICD-10-CM

## 2021-04-03 HISTORY — PX: COLONOSCOPY: SHX174

## 2021-04-03 MED ORDER — SODIUM CHLORIDE 0.9 % IV SOLN: 500.0000 mL | Freq: Once | INTRAVENOUS | Status: DC

## 2021-04-03 NOTE — Progress Notes (Signed)
PT taken to PACU. Monitors in place. VSS. Report given to RN. 

## 2021-04-03 NOTE — Op Note (Signed)
[place] ?Patient Name: David Hartman ?Procedure Date: 04/03/2021 10:54 AM ?MRN: [id number] ?Endoscopist: Milus Banister , MD ?Age: 61 ?Referring MD:  ?Date of Birth: September 03, 1960 ?Gender: Male ?Account #: [id number] ?Procedure:                Colonoscopy ?Indications:              High risk colon cancer surveillance: Personal  ?                          history of colonic polyps and FH of CRC (mother in  ?                          her 63s); Colonoscopy 07/2015 three subCM polyps  ?                          removed, two were adenomas. ?Medicines:                Monitored Anesthesia Care ?Procedure:                Pre-Anesthesia Assessment: ?                          - Prior to the procedure, a History and Physical  ?                          was performed, and patient medications and  ?                          allergies were reviewed. The patient's tolerance of  ?                          previous anesthesia was also reviewed. The risks  ?                          and benefits of the procedure and the sedation  ?                          options and risks were discussed with the patient.  ?                          All questions were answered, and informed consent  ?                          was obtained. Prior Anticoagulants: The patient has  ?                          taken no previous anticoagulant or antiplatelet  ?                          agents. ASA Grade Assessment: III - A patient with  ?                          severe systemic disease. After reviewing the risks  ?  and benefits, the patient was deemed in  ?                          satisfactory condition to undergo the procedure. ?                          After obtaining informed consent, the colonoscope  ?                          was passed under direct vision. Throughout the  ?                          procedure, the patient's blood pressure, pulse, and  ?                          oxygen saturations were monitored  continuously. The  ?                          CF [id number] #[id number] was introduced through the anus  ?                          and advanced to the the cecum, identified by  ?                          appendiceal orifice and ileocecal valve. The  ?                          colonoscopy was performed without difficulty. The  ?                          patient tolerated the procedure well. The quality  ?                          of the bowel preparation was good. The ileocecal  ?                          valve, appendiceal orifice, and rectum were  ?                          photographed. ?Scope In: 11:00:46 AM ?Scope Out: 60:63:01 AM ?Scope Withdrawal Time: 0 hours 12 minutes 20 seconds  ?Total Procedure Duration: 0 hours 16 minutes 13 seconds  ?Findings:                 A 4 mm polyp was found in the mid rectum. The polyp  ?                          was sessile. The polyp was removed with a cold  ?                          snare. Resection and retrieval were complete. ?                          Multiple small and large-mouthed diverticula were  ?  found in the left colon. ?                          The exam was otherwise without abnormality on  ?                          direct and retroflexion views. ?Complications:            No immediate complications. Estimated blood loss:  ?                          None. ?Estimated Blood Loss:     Estimated blood loss: none. ?Impression:               - One 4 mm polyp in the mid rectum, removed with a  ?                          cold snare. Resected and retrieved. ?                          - Diverticulosis in the left colon. ?                          - The examination was otherwise normal on direct  ?                          and retroflexion views. ?Recommendation:           - Patient has a contact number available for  ?                          emergencies. The signs and symptoms of potential  ?                          delayed complications were  discussed with the  ?                          patient. Return to normal activities tomorrow.  ?                          Written discharge instructions were provided to the  ?                          patient. ?                          - Resume previous diet. ?                          - Continue present medications. ?                          - Await pathology results. ?Milus Banister, MD ?04/03/2021 11:19:55 AM ?This report has been signed electronically. ?

## 2021-04-03 NOTE — Patient Instructions (Signed)
Handout on polyps  and diverticulosis provided  ? ?Await pathology results.  ? ?Continue current medications.  ? ?YOU HAD AN ENDOSCOPIC PROCEDURE TODAY AT [facility]:   Refer to the procedure report that was given to you for any specific questions about what was found during the examination.  If the procedure report does not answer your questions, please call your gastroenterologist to clarify.  If you requested that your care partner not be given the details of your procedure findings, then the procedure report has been included in a sealed envelope for you to review at your convenience later. ? ?YOU SHOULD EXPECT: Some feelings of bloating in the abdomen. Passage of more gas than usual.  Walking can help get rid of the air that was put into your GI tract during the procedure and reduce the bloating. If you had a lower endoscopy (such as a colonoscopy or flexible sigmoidoscopy) you may notice spotting of blood in your stool or on the toilet paper. If you underwent a bowel prep for your procedure, you may not have a normal bowel movement for a few days. ? ?Please Note:  You might notice some irritation and congestion in your nose or some drainage.  This is from the oxygen used during your procedure.  There is no need for concern and it should clear up in a day or so. ? ?SYMPTOMS TO REPORT IMMEDIATELY: ? ?Following lower endoscopy (colonoscopy or flexible sigmoidoscopy): ? Excessive amounts of blood in the stool ? Significant tenderness or worsening of abdominal pains ? Swelling of the abdomen that is new, acute ? Fever of 100?F or higher ? ? ?For urgent or emergent issues, a gastroenterologist can be reached at any hour by calling [phone]. ?Do not use MyChart messaging for urgent concerns.  ? ? ?DIET:  We do recommend a small meal at first, but then you may proceed to your regular diet.  Drink plenty of fluids but you should avoid alcoholic beverages for 24 hours. ? ?ACTIVITY:  You should  plan to take it easy for the rest of today and you should NOT DRIVE or use heavy machinery until tomorrow (because of the sedation medicines used during the test).   ? ?FOLLOW UP: ?Our staff will call the number listed on your records 48-72 hours following your procedure to check on you and address any questions or concerns that you may have regarding the information given to you following your procedure. If we do not reach you, we will leave a message.  We will attempt to reach you two times.  During this call, we will ask if you have developed any symptoms of COVID 19. If you develop any symptoms (ie: fever, flu-like symptoms, shortness of breath, cough etc.) before then, please call [phone].  If you test positive for Covid 19 in the 2 weeks post procedure, please call and report this information to [place].   ? ?If any biopsies were taken you will be contacted by phone or by letter within the next 1-3 weeks.  Please call us at [phone] if you have not heard about the biopsies in 3 weeks.  ? ? ?SIGNATURES/CONFIDENTIALITY: ?You and/or your care partner have signed paperwork which will be entered into your electronic medical record.  These signatures attest to the fact that that the information above on your After Visit Summary has been reviewed and is understood.  Full responsibility of the confidentiality of this discharge information lies with you and/or your care-partner. ? ? ?

## 2021-04-03 NOTE — Progress Notes (Signed)
Called to room to assist during endoscopic procedure.  Patient ID and intended procedure confirmed with present staff. Received instructions for my participation in the procedure from the performing physician.  

## 2021-04-03 NOTE — Progress Notes (Signed)
Pt's states no medical or surgical changes since previsit or office visit.  VS CW  
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
## 2021-04-05 ENCOUNTER — Telehealth: Payer: Self-pay

## 2021-04-05 NOTE — Telephone Encounter (Signed)
?  Follow up Call- ? ?Call back number 04/03/2021  ?Post procedure Call Back phone  # [phone]  ?Permission to leave phone message Yes  ?Some recent data might be hidden  ?  ? ?Patient questions: ? ?Do you have a fever, pain , or abdominal swelling? No. ?Pain Score  0 * ? ?Have you tolerated food without any problems? Yes.   ? ?Have you been able to return to your normal activities? Yes.   ? ?Do you have any questions about your discharge instructions: ?Diet   No. ?Medications  No. ?Follow up visit  No. ? ?Do you have questions or concerns about your Care? No. ? ?Actions: ?* If pain score is 4 or above: ?No action needed, pain <4. ? ? ?

## 2021-04-08 ENCOUNTER — Encounter: Payer: Self-pay | Admitting: Gastroenterology
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
## 2021-05-21 ENCOUNTER — Ambulatory Visit: Payer: Medicare Other | Admitting: Internal Medicine

## 2021-05-23 ENCOUNTER — Encounter: Payer: Self-pay | Admitting: Nurse Practitioner

## 2021-05-23 ENCOUNTER — Ambulatory Visit (INDEPENDENT_AMBULATORY_CARE_PROVIDER_SITE_OTHER): Payer: Medicare Other | Admitting: Nurse Practitioner

## 2021-05-23 DIAGNOSIS — I2510 Atherosclerotic heart disease of native coronary artery without angina pectoris: Secondary | ICD-10-CM | POA: Diagnosis not present

## 2021-05-23 DIAGNOSIS — E785 Hyperlipidemia, unspecified: Secondary | ICD-10-CM | POA: Diagnosis not present

## 2021-05-23 DIAGNOSIS — E1159 Type 2 diabetes mellitus with other circulatory complications: Secondary | ICD-10-CM | POA: Diagnosis not present

## 2021-05-23 DIAGNOSIS — R072 Precordial pain: Secondary | ICD-10-CM

## 2021-05-23 DIAGNOSIS — I152 Hypertension secondary to endocrine disorders: Secondary | ICD-10-CM

## 2021-05-23 NOTE — Patient Instructions (Signed)
Medication Instructions:  ? ?Your physician recommends that you continue on your current medications as directed. Please refer to the Current Medication list given to you today. ? ? ?*If you need a refill on your cardiac medications before your next appointment, please call your pharmacy* ? ? ?Lab Work: ? ?None ordered. ? ?If you have labs (blood work) drawn today and your tests are completely normal, you will receive your results only by: ?MyChart Message (if you have MyChart) OR ?A paper copy in the mail ?If you have any lab test that is abnormal or we need to change your treatment, we will call you to review the results. ? ? ?Testing/Procedures: ? ?None ordered.  ? ?Follow-Up: ?At [facility], you and your health needs are our priority.  As part of our continuing mission to provide you with exceptional heart care, we have created designated Provider Care Teams.  These Care Teams include your primary Cardiologist (physician) and Advanced Practice Providers (APPs -  Physician Assistants and Nurse Practitioners) who all work together to provide you with the care you need, when you need it. ? ?We recommend signing up for the patient portal called "MyChart".  Sign up information is provided on this After Visit Summary.  MyChart is used to connect with patients for Virtual Visits (Telemedicine).  Patients are able to view lab/test results, encounter notes, upcoming appointments, etc.  Non-urgent messages can be sent to your provider as well.   ?To learn more about what you can do with MyChart, go to [email or web address].   ? ?Your next appointment:   ?1 year(s) ? ?The format for your next appointment:   ?In Person ? ?Provider:   ?Werner Lean, MD   ? ? ?Other Instructions ? ?Your physician wants you to follow-up in: 1 year with Dr. Gasper Sells.  You will receive a reminder letter in the mail two months in advance. If you don't receive a letter, please call our office to schedule the follow-up  appointment. ? ?Mediterranean Diet ?A Mediterranean diet refers to food and lifestyle choices that are based on the traditions of countries located on the [employer]. It focuses on eating more fruits, vegetables, whole grains, beans, nuts, seeds, and heart-healthy fats, and eating less dairy, meat, eggs, and processed foods with added sugar, salt, and fat. This way of eating has been shown to help prevent certain conditions and improve outcomes for people who have chronic diseases, like kidney disease and heart disease. ?What are tips for following this plan? ?Reading food labels ?Check the serving size of packaged foods. For foods such as rice and pasta, the serving size refers to the amount of cooked product, not dry. ?Check the total fat in packaged foods. Avoid foods that have saturated fat or trans fats. ?Check the ingredient list for added sugars, such as corn syrup. ?Shopping ? ?Buy a variety of foods that offer a balanced diet, including: ?Fresh fruits and vegetables (produce). ?Grains, beans, nuts, and seeds. Some of these may be available in unpackaged forms or large amounts (in bulk). ?Fresh seafood. ?Poultry and eggs. ?Low-fat dairy products. ?Buy whole ingredients instead of prepackaged foods. ?Buy fresh fruits and vegetables in-season from local farmers markets. ?Buy plain frozen fruits and vegetables. ?If you do not have access to quality fresh seafood, buy precooked frozen shrimp or canned fish, such as tuna, salmon, or sardines. ?Stock your pantry so you always have certain foods on hand, such as olive oil, canned tuna, canned tomatoes, rice, pasta, and beans. ?  Cooking ?Cook foods with extra-virgin olive oil instead of using butter or other vegetable oils. ?Have meat as a side dish, and have vegetables or grains as your main dish. This means having meat in small portions or adding small amounts of meat to foods like pasta or stew. ?Use beans or vegetables instead of meat in common dishes like  chili or lasagna. ?Experiment with different cooking methods. Try roasting, broiling, steaming, and saut?ing vegetables. ?Add frozen vegetables to soups, stews, pasta, or rice. ?Add nuts or seeds for added healthy fats and plant protein at each meal. You can add these to yogurt, salads, or vegetable dishes. ?Marinate fish or vegetables using olive oil, lemon juice, garlic, and fresh herbs. ?Meal planning ?Plan to eat one vegetarian meal one day each week. Try to work up to two vegetarian meals, if possible. ?Eat seafood two or more times a week. ?Have healthy snacks readily available, such as: ?Vegetable sticks with hummus. ?[place] yogurt. ?Fruit and nut trail mix. ?Eat balanced meals throughout the week. This includes: ?Fruit: 2-3 servings a day. ?Vegetables: 4-5 servings a day. ?Low-fat dairy: 2 servings a day. ?Fish, poultry, or lean meat: 1 serving a day. ?Beans and legumes: 2 or more servings a week. ?Nuts and seeds: 1-2 servings a day. ?Whole grains: 6-8 servings a day. ?Extra-virgin olive oil: 3-4 servings a day. ?Limit red meat and sweets to only a few servings a month. ?Lifestyle ? ?Cook and eat meals together with your family, when possible. ?Drink enough fluid to keep your urine pale yellow. ?Be physically active every day. This includes: ?Aerobic exercise like running or swimming. ?Leisure activities like gardening, walking, or housework. ?Get 7-8 hours of sleep each night. ?If recommended by your health care provider, drink red wine in moderation. This means 1 glass a day for nonpregnant women and 2 glasses a day for men. A glass of wine equals 5 oz (150 mL). ?What foods should I eat? ?Fruits ?Apples. Apricots. Avocado. Berries. Bananas. Cherries. Dates. Figs. Grapes. Lemons. Melon. Oranges. Peaches. Plums. Pomegranate. ?Vegetables ?Artichokes. Beets. Broccoli. Cabbage. Carrots. Eggplant. Green beans. Chard. Kale. Spinach. Onions. Leeks. Peas. Squash. Tomatoes. Peppers. Radishes. ?Grains ?Whole-grain  pasta. Brown rice. Bulgur wheat. Polenta. Couscous. Whole-wheat bread. [place]. ?Meats and other proteins ?Beans. Almonds. Sunflower seeds. Pine nuts. Peanuts. [place]. Salmon. Scallops. Shrimp. [place]. Tilapia. Clams. Oysters. Eggs. Poultry without skin. ?Dairy ?Low-fat milk. Cheese. Greek yogurt. ?Fats and oils ?Extra-virgin olive oil. Avocado oil. Grapeseed oil. ?Beverages ?Water. Red wine. Herbal tea. ?Sweets and desserts ?Greek yogurt with honey. Baked apples. Poached pears. Trail mix. ?Seasonings and condiments ?Bas[place]. Cilantro. Coriander. Cumin. Mint. Parsley. Sage. Rosemary. Tarragon. Garlic. Oregano. Thyme. Pepper. Balsamic vinegar. Tahini. Hummus. Tomato sauce. Olives. Mushrooms. ?The items listed above may not be a complete list of foods and beverages you can eat. Contact a dietitian for more information. ?What foods should I limit? ?This is a list of foods that should be eaten rarely or only on special occasions. ?Fruits ?Fruit canned in syrup. ?Vegetables ?Deep-fried potatoes (french fries). ?Grains ?Prepackaged pasta or rice dishes. Prepackaged cereal with added sugar. Prepackaged snacks with added sugar. ?Meats and other proteins ?Beef. Pork. Lamb. Poultry with skin. Hot dogs. [place] Salines. ?Dairy ?Ice cream. Sour cream. Whole milk. ?Fats and oils ?Butter. Canola oil. Vegetable oil. Beef fat (tallow). Lard. ?Beverages ?Juice. Sugar-sweetened soft drinks. Beer. Liquor and spirits. ?Sweets and desserts ?Cookies. Cakes. Pies. Candy. ?Seasonings and condiments ?Mayonnaise. Pre-made sauces and marinades. ?The items listed above may not be a complete  list of foods and beverages you should limit. Contact a dietitian for more information. ?Summary ?The Mediterranean diet includes both food and lifestyle choices. ?Eat a variety of fresh fruits and vegetables, beans, nuts, seeds, and whole grains. ?Limit the amount of red meat and sweets that you eat. ?If recommended by your health care provider, drink red wine in  moderation. This means 1 glass a day for nonpregnant women and 2 glasses a day for men. A glass of wine equals 5 oz (150 mL). ?This information is not intended to replace advice given to you by your health care

## 2021-06-11 DIAGNOSIS — L298 Other pruritus: Secondary | ICD-10-CM | POA: Diagnosis not present

## 2021-06-11 DIAGNOSIS — R208 Other disturbances of skin sensation: Secondary | ICD-10-CM | POA: Diagnosis not present

## 2021-06-11 DIAGNOSIS — Z789 Other specified health status: Secondary | ICD-10-CM | POA: Diagnosis not present

## 2021-06-11 DIAGNOSIS — L821 Other seborrheic keratosis: Secondary | ICD-10-CM | POA: Diagnosis not present

## 2021-06-11 DIAGNOSIS — D1801 Hemangioma of skin and subcutaneous tissue: Secondary | ICD-10-CM | POA: Diagnosis not present

## 2021-06-11 DIAGNOSIS — L820 Inflamed seborrheic keratosis: Secondary | ICD-10-CM | POA: Diagnosis not present

## 2021-06-11 DIAGNOSIS — L538 Other specified erythematous conditions: Secondary | ICD-10-CM | POA: Diagnosis not present

## 2021-06-20 DIAGNOSIS — Z0000 Encounter for general adult medical examination without abnormal findings: Secondary | ICD-10-CM | POA: Diagnosis not present

## 2021-06-20 DIAGNOSIS — E785 Hyperlipidemia, unspecified: Secondary | ICD-10-CM | POA: Diagnosis not present

## 2021-06-20 DIAGNOSIS — F5101 Primary insomnia: Secondary | ICD-10-CM | POA: Diagnosis not present

## 2021-06-20 DIAGNOSIS — I10 Essential (primary) hypertension: Secondary | ICD-10-CM | POA: Diagnosis not present

## 2021-06-20 DIAGNOSIS — M1A9XX0 Chronic gout, unspecified, without tophus (tophi): Secondary | ICD-10-CM | POA: Diagnosis not present

## 2021-06-20 DIAGNOSIS — E1169 Type 2 diabetes mellitus with other specified complication: Secondary | ICD-10-CM | POA: Diagnosis not present

## 2021-06-20 DIAGNOSIS — G8929 Other chronic pain: Secondary | ICD-10-CM | POA: Diagnosis not present

## 2021-06-24 DIAGNOSIS — E119 Type 2 diabetes mellitus without complications: Secondary | ICD-10-CM | POA: Diagnosis not present

## 2021-07-24 DIAGNOSIS — G8929 Other chronic pain: Secondary | ICD-10-CM | POA: Diagnosis not present

## 2021-07-24 DIAGNOSIS — M5441 Lumbago with sciatica, right side: Secondary | ICD-10-CM | POA: Diagnosis not present

## 2021-07-24 DIAGNOSIS — M7918 Myalgia, other site: Secondary | ICD-10-CM | POA: Diagnosis not present

## 2021-07-24 DIAGNOSIS — Z79891 Long term (current) use of opiate analgesic: Secondary | ICD-10-CM | POA: Diagnosis not present

## 2021-07-24 DIAGNOSIS — M47814 Spondylosis without myelopathy or radiculopathy, thoracic region: Secondary | ICD-10-CM | POA: Diagnosis not present

## 2021-07-24 DIAGNOSIS — M5442 Lumbago with sciatica, left side: Secondary | ICD-10-CM | POA: Diagnosis not present

## 2021-08-20 ENCOUNTER — Encounter (HOSPITAL_COMMUNITY): Payer: Self-pay | Admitting: Emergency Medicine

## 2021-08-20 ENCOUNTER — Emergency Department (HOSPITAL_COMMUNITY)
Admission: EM | Admit: 2021-08-20 | Discharge: 2021-08-20 | Disposition: A | Discharge status: Home / Self Care | Payer: Medicare Other | Attending: Emergency Medicine | Admitting: Emergency Medicine

## 2021-08-20 ENCOUNTER — Emergency Department (HOSPITAL_COMMUNITY): Payer: Medicare Other

## 2021-08-20 ENCOUNTER — Other Ambulatory Visit: Payer: Self-pay

## 2021-08-20 DIAGNOSIS — Z79899 Other long term (current) drug therapy: Secondary | ICD-10-CM | POA: Diagnosis not present

## 2021-08-20 DIAGNOSIS — R7989 Other specified abnormal findings of blood chemistry: Secondary | ICD-10-CM | POA: Diagnosis not present

## 2021-08-20 DIAGNOSIS — I10 Essential (primary) hypertension: Secondary | ICD-10-CM | POA: Insufficient documentation

## 2021-08-20 DIAGNOSIS — Z20822 Contact with and (suspected) exposure to covid-19: Secondary | ICD-10-CM | POA: Diagnosis not present

## 2021-08-20 DIAGNOSIS — R42 Dizziness and giddiness: Secondary | ICD-10-CM

## 2021-08-20 DIAGNOSIS — R7401 Elevation of levels of liver transaminase levels: Secondary | ICD-10-CM | POA: Insufficient documentation

## 2021-08-20 DIAGNOSIS — Z7982 Long term (current) use of aspirin: Secondary | ICD-10-CM | POA: Insufficient documentation

## 2021-08-20 DIAGNOSIS — B349 Viral infection, unspecified: Secondary | ICD-10-CM | POA: Insufficient documentation

## 2021-08-20 LAB — CBC WITH DIFFERENTIAL/PLATELET
Abs Immature Granulocytes: 0.02 10*3/uL (ref 0.00–0.07)
Basophils Absolute: 0 10*3/uL (ref 0.0–0.1)
Basophils Relative: 0 %
Eosinophils Absolute: 0.2 10*3/uL (ref 0.0–0.5)
Eosinophils Relative: 2 %
HCT: 40.2 % (ref 39.0–52.0)
Hemoglobin: 13.4 g/dL (ref 13.0–17.0)
Immature Granulocytes: 0 %
Lymphocytes Relative: 16 %
Lymphs Abs: 1.1 10*3/uL (ref 0.7–4.0)
MCH: 31.3 pg (ref 26.0–34.0)
MCHC: 33.3 g/dL (ref 30.0–36.0)
MCV: 93.9 fL (ref 80.0–100.0)
Monocytes Absolute: 0.8 10*3/uL (ref 0.1–1.0)
Monocytes Relative: 12 %
Neutro Abs: 4.6 10*3/uL (ref 1.7–7.7)
Neutrophils Relative %: 70 %
Platelets: 142 10*3/uL — ABNORMAL LOW (ref 150–400)
RBC: 4.28 MIL/uL (ref 4.22–5.81)
RDW: 13.2 % (ref 11.5–15.5)
WBC: 6.7 10*3/uL (ref 4.0–10.5)
nRBC: 0 % (ref 0.0–0.2)

## 2021-08-20 LAB — COMPREHENSIVE METABOLIC PANEL
ALT: 47 U/L — ABNORMAL HIGH (ref 0–44)
AST: 55 U/L — ABNORMAL HIGH (ref 15–41)
Albumin: 3.6 g/dL (ref 3.5–5.0)
Alkaline Phosphatase: 86 U/L (ref 38–126)
Anion gap: 10 (ref 5–15)
BUN: 23 mg/dL — ABNORMAL HIGH (ref 6–20)
CO2: 23 mmol/L (ref 22–32)
Calcium: 8.6 mg/dL — ABNORMAL LOW (ref 8.9–10.3)
Chloride: 107 mmol/L (ref 98–111)
Creatinine, Ser: 1.09 mg/dL (ref 0.61–1.24)
GFR, Estimated: >60 mL/min (ref >60)
Glucose, Bld: 119 mg/dL — ABNORMAL HIGH (ref 70–99)
Potassium: 4 mmol/L (ref 3.5–5.1)
Sodium: 140 mmol/L (ref 135–145)
Total Bilirubin: 0.8 mg/dL (ref 0.3–1.2)
Total Protein: 7 g/dL (ref 6.5–8.1)

## 2021-08-20 LAB — URINALYSIS, ROUTINE W REFLEX MICROSCOPIC
Bilirubin Urine: NEGATIVE
Glucose, UA: NEGATIVE mg/dL
Hgb urine dipstick: NEGATIVE
Ketones, ur: NEGATIVE mg/dL
Leukocytes,Ua: NEGATIVE
Nitrite: NEGATIVE
Protein, ur: NEGATIVE mg/dL
Specific Gravity, Urine: 1.02 (ref 1.005–1.030)
pH: 5 (ref 5.0–8.0)

## 2021-08-20 LAB — SARS CORONAVIRUS 2 BY RT PCR: SARS Coronavirus 2 by RT PCR: NEGATIVE

## 2021-08-20 MED ORDER — MECLIZINE HCL 25 MG PO TABS
25.0000 mg | ORAL_TABLET | Freq: Once | ORAL | Status: AC
  Administered 2021-08-20: 25 mg via ORAL
  Filled 2021-08-20: qty 1

## 2021-08-20 MED ORDER — DOXYCYCLINE HYCLATE 100 MG PO CAPS: 100.0000 mg | ORAL_CAPSULE | Freq: Two times a day (BID) | ORAL | 0 refills | Status: DC

## 2021-08-20 MED ORDER — LACTATED RINGERS IV BOLUS
1000.0000 mL | Freq: Once | INTRAVENOUS | Status: AC
  Administered 2021-08-20: 1000 mL via INTRAVENOUS

## 2021-08-20 MED ORDER — MECLIZINE HCL 25 MG PO TABS: 25.0000 mg | ORAL_TABLET | Freq: Three times a day (TID) | ORAL | 0 refills | Status: DC | PRN

## 2021-08-20 MED ORDER — ACETAMINOPHEN 500 MG PO TABS
1000.0000 mg | ORAL_TABLET | Freq: Once | ORAL | Status: AC
  Administered 2021-08-20: 1000 mg via ORAL
  Filled 2021-08-20: qty 2

## 2021-08-20 MED ORDER — ONDANSETRON HCL 4 MG/2ML IJ SOLN
4.0000 mg | Freq: Once | INTRAMUSCULAR | Status: AC
  Administered 2021-08-20: 4 mg via INTRAVENOUS
  Filled 2021-08-20: qty 2
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
## 2021-08-20 NOTE — ED Triage Notes (Signed)
Pt reports weakness, dizziness, fevers, nausea and back pain x 3 days now.

## 2021-08-20 NOTE — Discharge Instructions (Signed)
All your labs and CAT scan today looked good.  I suspect that this is a viral illness and within the next 2 to 3 days you will be feeling better.  However if you notice the fever is getting worse, the headache is worsening and you are not improving by day 3 you should start doxycycline in case you had a chance been bit by a tick.  You can use the meclizine as needed for the dizziness but you do not have to use it if you feel okay.  Make sure you continue to drink plenty of fluids.  Use your cane as needed.  Make sure not to make any sudden movements with your head.  If you start having persistent vomiting, high fever that will not improve, confusion or one-sided weakness return to the emergency room immediately.

## 2021-10-31 IMAGING — CT CT RENAL STONE PROTOCOL
2 of 4 series · 16 of 46 positions shown, 18 images · non-contrast
Comparison: December 03, 2017

CLINICAL DATA: Left flank pain

EXAM:
CT ABDOMEN AND PELVIS WITHOUT CONTRAST
TECHNIQUE: Multidetector CT imaging of the abdomen and pelvis was performed
following the standard protocol without IV contrast.

[Series 2: axial st · axial · 0.97mm/px · z∈[+1154,+1594]mm · 13 of 102 slices shown, 15 images]
[im 7/102  soft-tissue]
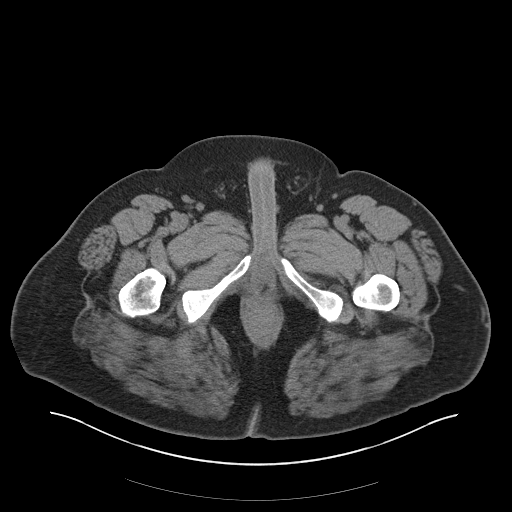
[im 7/102  bone]
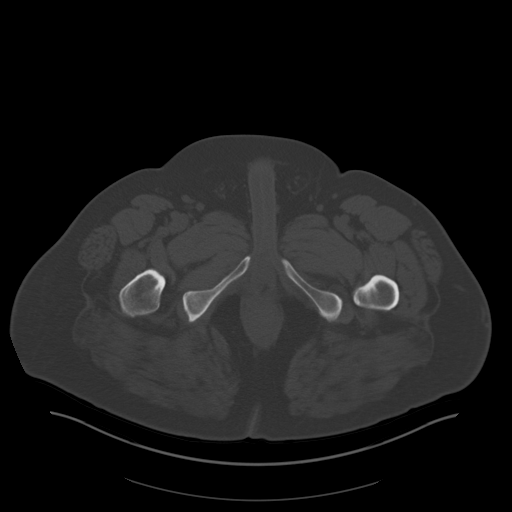
[im 14/102  soft-tissue]
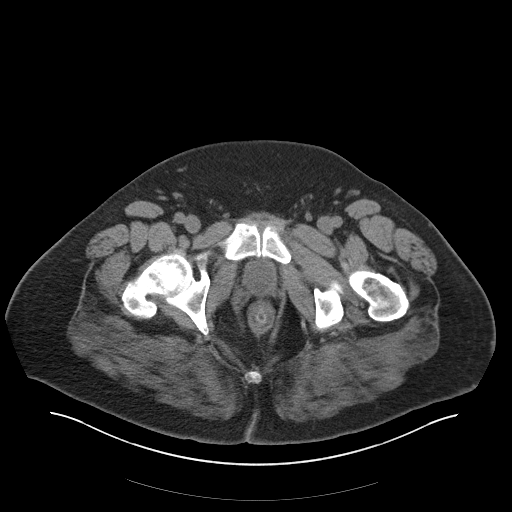
[im 21/102  soft-tissue]
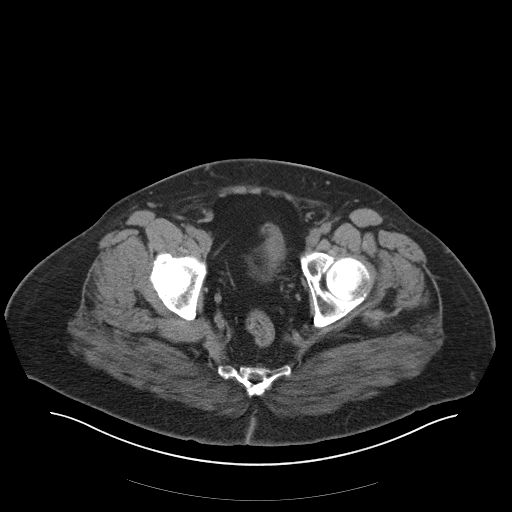
[im 27/102  soft-tissue]
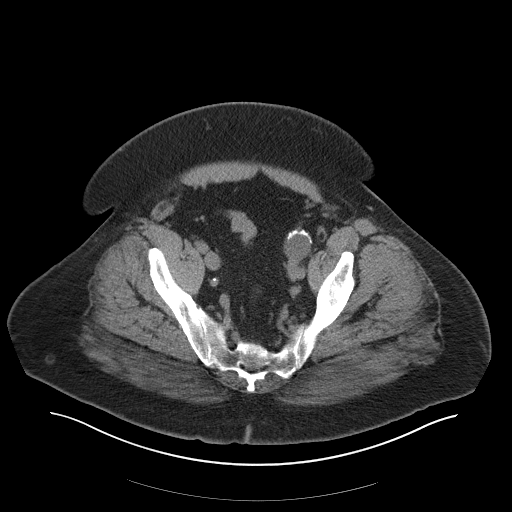
[im 34/102  soft-tissue]
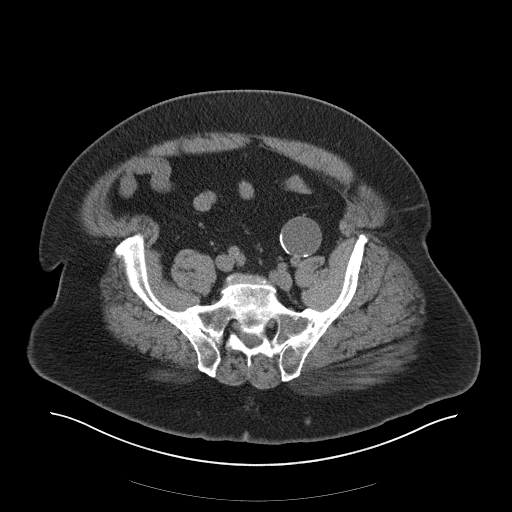
[im 41/102  soft-tissue]
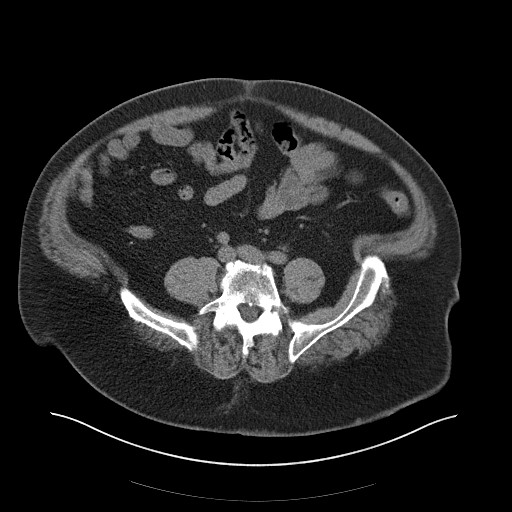
[im 54/102  soft-tissue]
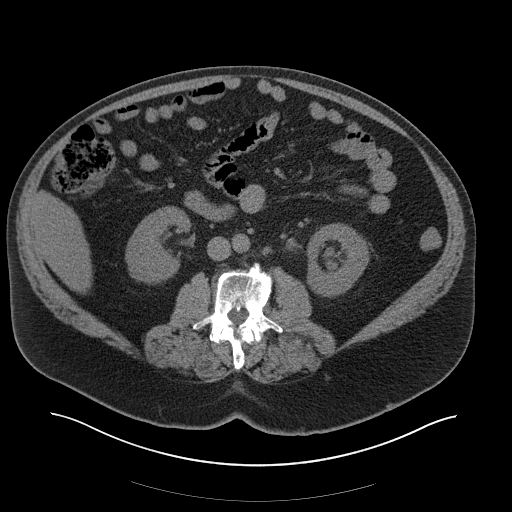
[im 61/102  soft-tissue]
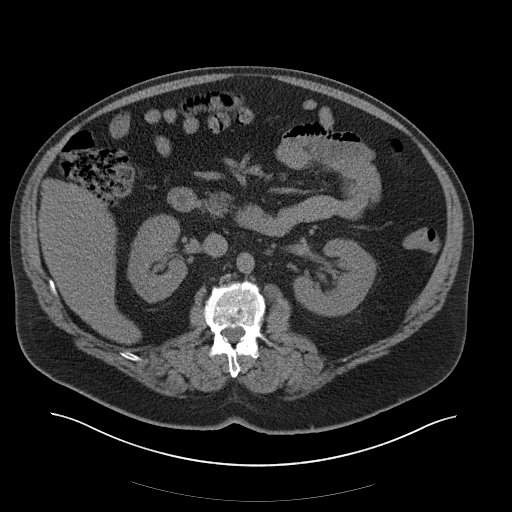
[im 68/102  soft-tissue]
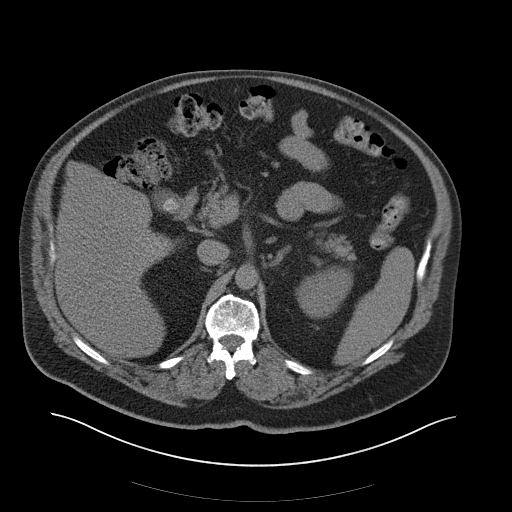
[im 68/102  bone]
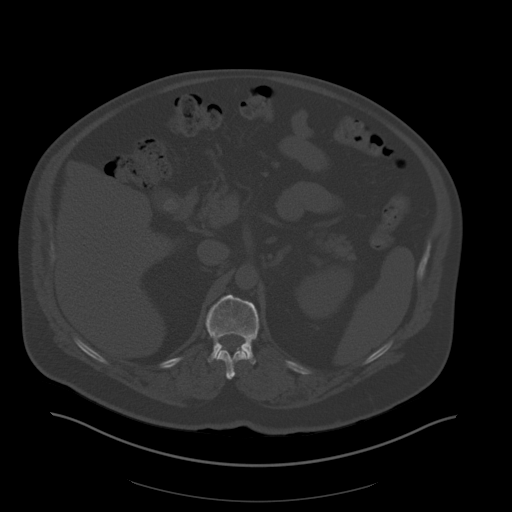
[im 75/102  soft-tissue]
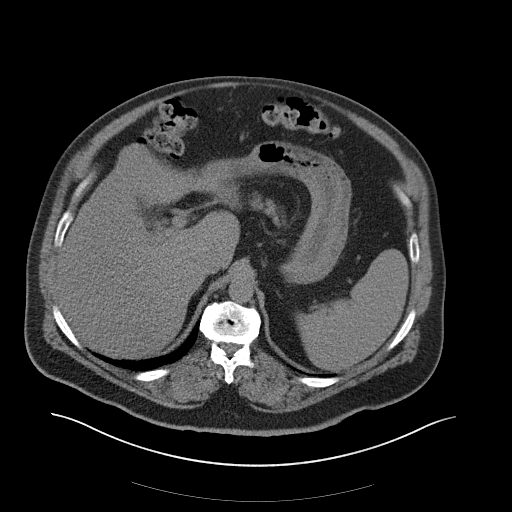
[im 81/102  soft-tissue]
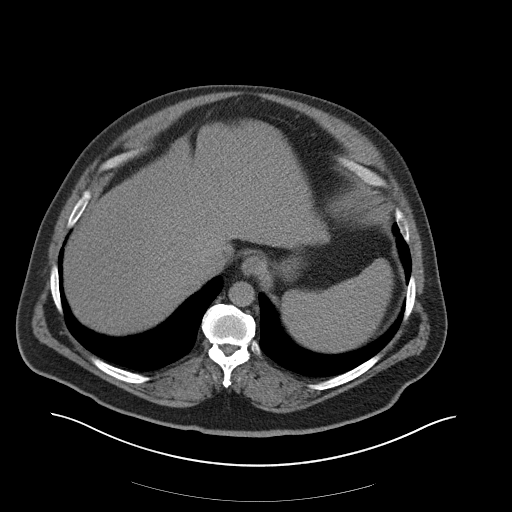
[im 88/102  soft-tissue]
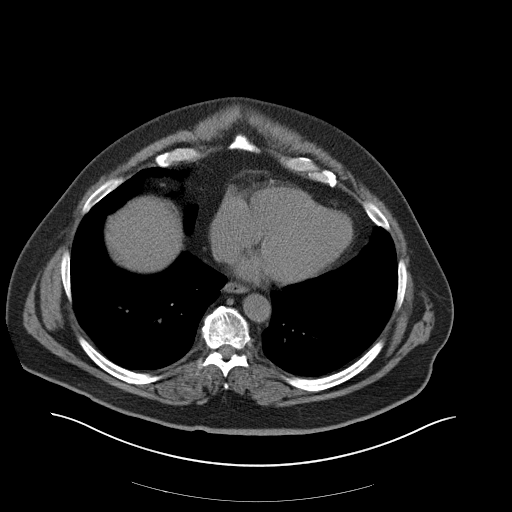
[im 95/102  soft-tissue]
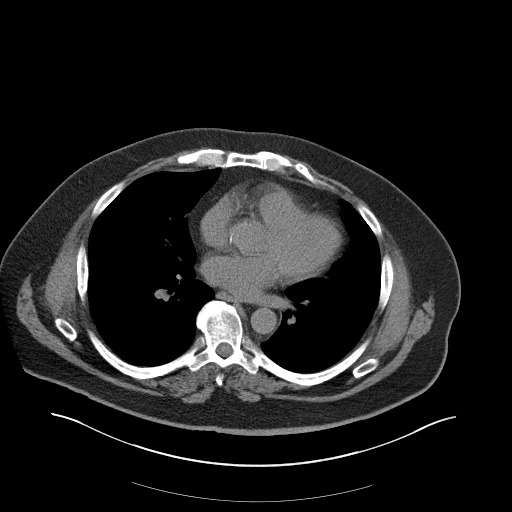

[Series 5: coronal · coronal · 0.98mm/px · 3 of 198 slices shown]
[im 66/198  soft-tissue]
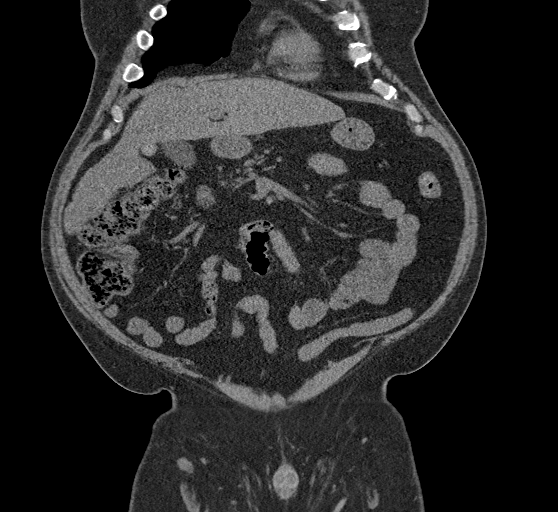
[im 88/198  soft-tissue]
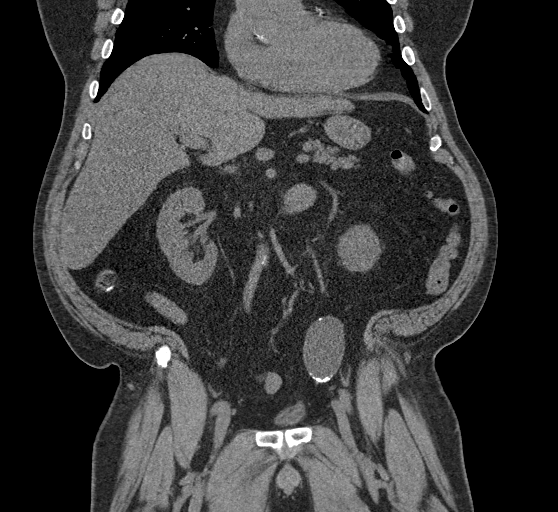
[im 110/198  soft-tissue]
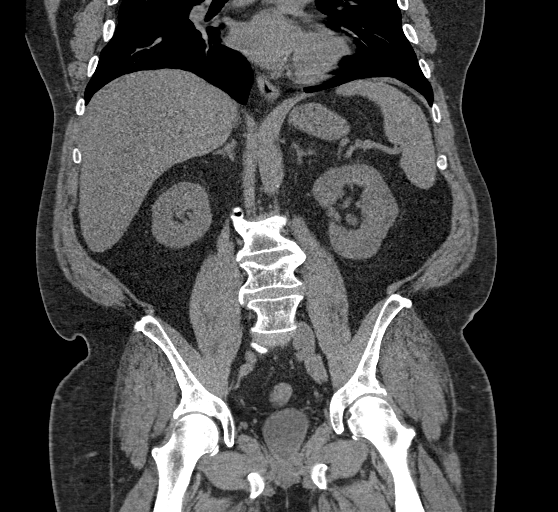

[16 of 46 positions shown; findings below may reference images not displayed]

FINDINGS: Lower chest: The visualized heart size within normal limits. No
pericardial fluid/thickening. Coronary artery calcifications are
seen.

No hiatal hernia.

The visualized portions of the lungs are clear.

Hepatobiliary: Although limited due to the lack of intravenous
contrast, normal in appearance without gross focal abnormality.
Calcified gallstones are present.

Pancreas:  Unremarkable.  No surrounding inflammatory changes.

Spleen: Normal in size. Although limited due to the lack of
intravenous contrast, normal in appearance.

Adrenals/Urinary Tract: Both adrenal glands appear normal. Mild left
pelvicaliectasis and ureterectasis is noted with a proximal left
ureteral stone measuring 3 mm. Mild left perinephric stranding is
seen. No right-sided renal or collecting system calculi are seen.
The bladder is partially decompressed, however unremarkable.

Stomach/Bowel: The stomach, small bowel, and colon are normal in
appearance. No inflammatory changes or obstructive findings. The
patient is status post appendectomy.

Vascular/Lymphatic: There are no enlarged abdominal or pelvic lymph
nodes. No significant gross vascular findings are present given the
lack of intravenous contrast.

Reproductive: The patient is status post prostatectomy. Within the
left deep pelvis is a low-density lesion within is peripherally
calcified measuring 1.1 cm.

Other: No evidence of abdominal wall mass or hernia.

Musculoskeletal: No acute or significant osseous findings. there is
a chronic anterior wedge compression deformity of the L2 vertebral
body with approximately 50% loss in anterior vertebral body height.
IMPRESSION: Mild left hydronephrosis with a proximal 3 mm ureteral calculus.

Cholelithiasis

Stable calcified left pelvic cystic lesion which could represent a
lymphocele.

## 2021-11-14 DIAGNOSIS — Z23 Encounter for immunization: Secondary | ICD-10-CM | POA: Diagnosis not present

## 2021-11-28 DIAGNOSIS — S32020S Wedge compression fracture of second lumbar vertebra, sequela: Secondary | ICD-10-CM | POA: Diagnosis not present

## 2021-11-28 DIAGNOSIS — Z79899 Other long term (current) drug therapy: Secondary | ICD-10-CM | POA: Diagnosis not present

## 2021-11-28 DIAGNOSIS — M546 Pain in thoracic spine: Secondary | ICD-10-CM | POA: Diagnosis not present

## 2021-11-28 DIAGNOSIS — G8929 Other chronic pain: Secondary | ICD-10-CM | POA: Insufficient documentation

## 2021-11-28 DIAGNOSIS — G894 Chronic pain syndrome: Secondary | ICD-10-CM | POA: Diagnosis not present

## 2021-12-09 ENCOUNTER — Emergency Department (HOSPITAL_BASED_OUTPATIENT_CLINIC_OR_DEPARTMENT_OTHER): Payer: Medicare Other | Admitting: Radiology

## 2021-12-09 ENCOUNTER — Other Ambulatory Visit: Payer: Self-pay

## 2021-12-09 ENCOUNTER — Encounter (HOSPITAL_BASED_OUTPATIENT_CLINIC_OR_DEPARTMENT_OTHER): Payer: Self-pay

## 2021-12-09 DIAGNOSIS — E119 Type 2 diabetes mellitus without complications: Secondary | ICD-10-CM | POA: Diagnosis not present

## 2021-12-09 DIAGNOSIS — Z8546 Personal history of malignant neoplasm of prostate: Secondary | ICD-10-CM | POA: Diagnosis not present

## 2021-12-09 DIAGNOSIS — Z20822 Contact with and (suspected) exposure to covid-19: Secondary | ICD-10-CM | POA: Diagnosis not present

## 2021-12-09 DIAGNOSIS — R0789 Other chest pain: Secondary | ICD-10-CM | POA: Diagnosis not present

## 2021-12-09 DIAGNOSIS — R059 Cough, unspecified: Secondary | ICD-10-CM | POA: Diagnosis not present

## 2021-12-09 DIAGNOSIS — Z79899 Other long term (current) drug therapy: Secondary | ICD-10-CM | POA: Insufficient documentation

## 2021-12-09 DIAGNOSIS — I10 Essential (primary) hypertension: Secondary | ICD-10-CM | POA: Diagnosis not present

## 2021-12-09 DIAGNOSIS — Z7984 Long term (current) use of oral hypoglycemic drugs: Secondary | ICD-10-CM | POA: Insufficient documentation

## 2021-12-09 DIAGNOSIS — Z7982 Long term (current) use of aspirin: Secondary | ICD-10-CM | POA: Insufficient documentation

## 2021-12-09 DIAGNOSIS — J209 Acute bronchitis, unspecified: Secondary | ICD-10-CM | POA: Diagnosis not present

## 2021-12-09 LAB — RESP PANEL BY RT-PCR (FLU A&B, COVID) ARPGX2
Influenza A by PCR: NEGATIVE
Influenza B by PCR: NEGATIVE
SARS Coronavirus 2 by RT PCR: NEGATIVE

## 2021-12-09 NOTE — ED Triage Notes (Signed)
Cough and congestion x 2 weeks Has been taking OTC meds Reports no fevers CP when he takes a deep breath

## 2021-12-10 ENCOUNTER — Emergency Department (HOSPITAL_BASED_OUTPATIENT_CLINIC_OR_DEPARTMENT_OTHER)
Admission: EM | Admit: 2021-12-10 | Discharge: 2021-12-10 | Disposition: A | Discharge status: Home / Self Care | Payer: Medicare Other | Attending: Emergency Medicine | Admitting: Emergency Medicine

## 2021-12-10 DIAGNOSIS — R0789 Other chest pain: Secondary | ICD-10-CM

## 2021-12-10 DIAGNOSIS — J209 Acute bronchitis, unspecified: Secondary | ICD-10-CM

## 2021-12-10 MED ORDER — DOXYCYCLINE HYCLATE 100 MG PO TABS
100.0000 mg | ORAL_TABLET | Freq: Once | ORAL | Status: AC
  Administered 2021-12-10: 100 mg via ORAL
  Filled 2021-12-10: qty 1

## 2021-12-10 MED ORDER — DOXYCYCLINE HYCLATE 100 MG PO CAPS: 100.0000 mg | ORAL_CAPSULE | Freq: Two times a day (BID) | ORAL | 0 refills | Status: DC

## 2021-12-10 MED ORDER — IPRATROPIUM-ALBUTEROL 0.5-2.5 (3) MG/3ML IN SOLN
3.0000 mL | Freq: Once | RESPIRATORY_TRACT | Status: AC
Start: 2021-12-10 — End: 2021-12-10
  Administered 2021-12-10: 3 mL via RESPIRATORY_TRACT
  Filled 2021-12-10: qty 3

## 2021-12-10 NOTE — Discharge Instructions (Addendum)
Drink plenty of fluids.  You may take any of the over-the-counter cough and cold medications as needed.  However, none of them are very effective.  You may take ibuprofen and/or acetaminophen as needed for pain.  Please apply ice to the painful area of your chest.  Ice to be applied for 30 minutes at a time, 4 times a day.  Return if symptoms are getting worse.

## 2021-12-10 NOTE — ED Provider Notes (Signed)
[facility]ER[facility]ENC[facility] DEPT Provider Note   CSN: [id number] Arrival date & time: 12/09/21  2209     History  Chief Complaint  Patient presents with   Cough    David Hartman is a 61 y.o. male.  The history is provided by the patient.  Cough He has history of hypertension, diabetes, hyperlipidemia, gout, GERD, prostate cancer comes in with cough for the last 2 weeks.  He cough is productive of green to brown sputum.  There has also been similarly colored nasal rhinorrhea.  He denies fever, chills but has had some sweats.  For the last several days, he has been having pain in his right lower rib cage when he takes a deep breath or coughs.   Home Medications Prior to Admission medications   Medication Sig Start Date End Date Taking? Authorizing Provider  acetaminophen (TYLENOL) 500 MG tablet Take 1,000 mg by mouth every 6 (six) hours as needed (pain). Patient not taking: Reported on 05/23/2021    [provider]  allopurinol (ZYLOPRIM) 300 MG tablet Take 300 mg by mouth daily.    [provider]  ALPRAZolam Duanne Moron) 1 MG tablet Take 0.5 mg by mouth at bedtime as needed for anxiety.  12/07/13   [provider]  amLODipine (NORVASC) 10 MG tablet Take 10 mg by mouth daily. 11/14/19   [provider]  ascorbic acid (VITAMIN C) 500 MG tablet Take 500 mg by mouth daily with lunch.    [provider]  aspirin EC 81 MG tablet Take 1 tablet (81 mg total) by mouth daily. Swallow whole. 03/08/21   Chandrasekhar, Lyda Kalata A, MD  atorvastatin (LIPITOR) 80 MG tablet Take 1 tablet (80 mg total) by mouth daily. 03/08/21   Werner Lean, MD  Cholecalciferol (VITAMIN D3 PO) Take 1 tablet by mouth daily.    [provider]  colchicine 0.6 MG tablet Take 0.6 mg by mouth daily as needed (gout). Patient not taking: Reported on 03/20/2021    [provider]  doxycycline (VIBRAMYCIN) 100 MG capsule Take 1 capsule (100 mg total)  by mouth 2 (two) times daily. 08/20/21   Blanchie Dessert, MD  DULoxetine (CYMBALTA) 60 MG capsule TAKE 1 CAPSULE BY MOUTH EVERY DAY 02/24/17   Jamse Arn, MD  ferrous sulfate 325 (65 FE) MG tablet Take 325 mg by mouth daily.    [provider]  ibuprofen (ADVIL) 200 MG tablet Take 600-800 mg by mouth every 6 (six) hours as needed (pain).    [provider]  lisinopril (PRINIVIL,ZESTRIL) 40 MG tablet Take 40 mg by mouth daily.  04/20/15   [provider]  Magnesium 200 MG TABS Take 200 mg by mouth at bedtime.    [provider]  meclizine (ANTIVERT) 25 MG tablet Take 1 tablet (25 mg total) by mouth 3 (three) times daily as needed for dizziness. 08/20/21   Blanchie Dessert, MD  metFORMIN (GLUCOPHAGE) 500 MG tablet Take 2 tablets by mouth in the morning and at bedtime.    [provider]  Multiple Vitamin (MULTIVITAMIN WITH MINERALS) TABS tablet Take 1 tablet by mouth daily. Men over 21    [provider]  Multiple Vitamins-Minerals (ZINC PO) Take 1 tablet by mouth daily with lunch.    [provider]  omeprazole (PRILOSEC OTC) 20 MG tablet Take 20 mg by mouth daily.    [provider]  propranolol (INDERAL) 40 MG tablet Take 40 mg by mouth daily.  [provider]  tiZANidine (ZANAFLEX) 4 MG tablet Take 4 mg by mouth at bedtime. 11/14/19   [provider]  traMADol (ULTRAM) 50 MG tablet Take 50 mg by mouth 2 (two) times daily. For back pain    [provider]  zolpidem (AMBIEN) 10 MG tablet Take 5 mg by mouth at bedtime.  11/27/17   [provider]      Allergies    Pregabalin, Simvastatin, and Trazodone and nefazodone    Review of Systems   Review of Systems  Respiratory:  Positive for cough.   All other systems reviewed and are negative.   Physical Exam Updated Vital Signs BP (!) 155/87   Pulse 70   Temp 98.5 F (36.9 C) (Oral)   Resp 18   Ht '5\' 10"'$  (1.778 m)   Wt 117.5 kg    SpO2 98%   BMI 37.16 kg/m  Physical Exam Vitals and nursing note reviewed.   61 year old male, resting comfortably and in no acute distress. Vital signs are significant for elevated blood pressure. Oxygen saturation is 98%, which is normal. Head is normocephalic and atraumatic. PERRLA, EOMI. Oropharynx is clear.  There is tenderness to palpation over the maxillary sinuses. Neck is nontender and supple without adenopathy or JVD. Back is nontender and there is no CVA tenderness. Lungs are clear without rales, wheezes, or rhonchi.  Prolonged exhalation phase is noted. Chest is moderately tender over the right inferior rib cage anteriorly. Heart has regular rate and rhythm without murmur. Abdomen is soft, flat, nontender. Extremities have trace edema, full range of motion is present. Skin is warm and dry without rash. Neurologic: Mental status is normal, cranial nerves are intact, moves all extremities equally.  ED Results / Procedures / Treatments   Labs (all labs ordered are listed, but only abnormal results are displayed) Labs Reviewed  RESP PANEL BY RT-PCR (FLU A&B, COVID) [id number]    EKG EKG Interpretation  Date/Time:  Monday December 09 2021 22:51:55 EST Ventricular Rate:  78 PR Interval:  206 QRS Duration: 80 QT Interval:  366 QTC Calculation: 417 R Axis:   89 Text Interpretation: Normal sinus rhythm Normal ECG When compared with ECG of 23-Jan-2021 09:14, No significant change was found Confirmed by Delora Fuel ([id number]) on 12/09/2021 10:55:01 PM  Radiology DG Chest 2 View  Result Date: 12/09/2021 CLINICAL DATA:  Cough EXAM: CHEST - 2 VIEW COMPARISON:  01/23/2021 FINDINGS: The heart size and mediastinal contours are within normal limits. Both lungs are clear. The visualized skeletal structures are unremarkable. IMPRESSION: No active cardiopulmonary disease. Electronically Signed   By: Rolm Baptise M.D.   On: 12/09/2021 23:11    Procedures Procedures    Medications  Ordered in ED Medications  ipratropium-albuterol (DUONEB) 0.5-2.5 (3) MG/3ML nebulizer solution 3 mL (has no administration in time range)  doxycycline (VIBRA-TABS) tablet 100 mg (has no administration in time range)    ED Course/ Medical Decision Making/ A&P                           Medical Decision Making Amount and/or Complexity of Data Reviewed Radiology: ordered.  Risk Prescription drug management.   Respiratory tract infection which at least initially was viral, consider bacterial superinfection with either pneumonia or bronchitis.  Chest wall pain secondary to coughing.  Chest x-ray shows no evidence of pneumonia.  Have independently viewed the images, and agree with radiologist's interpretation.  I have reviewed and  interpreted his electrocardiogram and my interpretation is normal ECG.  I have reviewed and interpreted his laboratory tests and my interpretation is negative PCR for influenza, COVID-19.  This appears to be bacterial bronchitis.  I have ordered a dose of doxycycline.  I have also ordered a therapeutic trial of a nebulizer treatment with albuterol and ipratropium.  He had no relief of symptoms with albuterol and ipratropium.  I am discharging him with a prescription for doxycycline.  Advised to apply ice to the sore area in his chest, use over-the-counter NSAIDs and acetaminophen as needed for pain.  Return if symptoms are worsening.  Final Clinical Impression(s) / ED Diagnoses Final diagnoses:  Acute bronchitis, unspecified organism  Right-sided chest wall pain    Rx / DC Orders ED Discharge Orders          Ordered    doxycycline (VIBRAMYCIN) 100 MG capsule  2 times daily        12/10/21 8329              Delora Fuel, MD [birth year] [phone]

## 2021-12-19 ENCOUNTER — Telehealth: Payer: Self-pay

## 2021-12-19 NOTE — Telephone Encounter (Signed)
        Patient  visited [facility] on 11/20   Telephone encounter attempt :  1st  A HIPAA compliant voice message was left requesting a return call.  Instructed patient to call back    [facility]  [phone] [address], [place], [place] [address] Phone: [phone] Email: [email or web address]

## 2021-12-26 DIAGNOSIS — I10 Essential (primary) hypertension: Secondary | ICD-10-CM | POA: Diagnosis not present

## 2021-12-26 DIAGNOSIS — F5101 Primary insomnia: Secondary | ICD-10-CM | POA: Diagnosis not present

## 2021-12-26 DIAGNOSIS — M25511 Pain in right shoulder: Secondary | ICD-10-CM | POA: Diagnosis not present

## 2021-12-26 DIAGNOSIS — E1169 Type 2 diabetes mellitus with other specified complication: Secondary | ICD-10-CM | POA: Diagnosis not present

## 2021-12-26 DIAGNOSIS — E785 Hyperlipidemia, unspecified: Secondary | ICD-10-CM | POA: Diagnosis not present

## 2022-02-19 ENCOUNTER — Emergency Department (HOSPITAL_COMMUNITY): Payer: Medicare Other

## 2022-02-19 ENCOUNTER — Emergency Department (HOSPITAL_COMMUNITY)
Admission: EM | Admit: 2022-02-19 | Discharge: 2022-02-19 | Disposition: A | Discharge status: Home / Self Care | Payer: Medicare Other | Attending: Emergency Medicine | Admitting: Emergency Medicine

## 2022-02-19 DIAGNOSIS — N23 Unspecified renal colic: Secondary | ICD-10-CM | POA: Diagnosis not present

## 2022-02-19 DIAGNOSIS — I700 Atherosclerosis of aorta: Secondary | ICD-10-CM | POA: Diagnosis not present

## 2022-02-19 DIAGNOSIS — D72829 Elevated white blood cell count, unspecified: Secondary | ICD-10-CM | POA: Insufficient documentation

## 2022-02-19 DIAGNOSIS — Z79899 Other long term (current) drug therapy: Secondary | ICD-10-CM | POA: Insufficient documentation

## 2022-02-19 DIAGNOSIS — J9811 Atelectasis: Secondary | ICD-10-CM | POA: Diagnosis not present

## 2022-02-19 DIAGNOSIS — Z7984 Long term (current) use of oral hypoglycemic drugs: Secondary | ICD-10-CM | POA: Insufficient documentation

## 2022-02-19 DIAGNOSIS — R059 Cough, unspecified: Secondary | ICD-10-CM | POA: Diagnosis not present

## 2022-02-19 DIAGNOSIS — N132 Hydronephrosis with renal and ureteral calculous obstruction: Secondary | ICD-10-CM | POA: Diagnosis not present

## 2022-02-19 DIAGNOSIS — K8020 Calculus of gallbladder without cholecystitis without obstruction: Secondary | ICD-10-CM | POA: Diagnosis not present

## 2022-02-19 DIAGNOSIS — R6883 Chills (without fever): Secondary | ICD-10-CM | POA: Diagnosis not present

## 2022-02-19 DIAGNOSIS — R519 Headache, unspecified: Secondary | ICD-10-CM | POA: Diagnosis not present

## 2022-02-19 DIAGNOSIS — Z20822 Contact with and (suspected) exposure to covid-19: Secondary | ICD-10-CM | POA: Insufficient documentation

## 2022-02-19 DIAGNOSIS — R109 Unspecified abdominal pain: Secondary | ICD-10-CM | POA: Diagnosis present

## 2022-02-19 LAB — CBC WITH DIFFERENTIAL/PLATELET
Abs Immature Granulocytes: 0.03 10*3/uL (ref 0.00–0.07)
Basophils Absolute: 0 10*3/uL (ref 0.0–0.1)
Basophils Relative: 0 %
Eosinophils Absolute: 0.3 10*3/uL (ref 0.0–0.5)
Eosinophils Relative: 2 %
HCT: 35.5 % — ABNORMAL LOW (ref 39.0–52.0)
Hemoglobin: 11.8 g/dL — ABNORMAL LOW (ref 13.0–17.0)
Immature Granulocytes: 0 %
Lymphocytes Relative: 14 %
Lymphs Abs: 1.7 10*3/uL (ref 0.7–4.0)
MCH: 30.7 pg (ref 26.0–34.0)
MCHC: 33.2 g/dL (ref 30.0–36.0)
MCV: 92.4 fL (ref 80.0–100.0)
Monocytes Absolute: 1.2 10*3/uL — ABNORMAL HIGH (ref 0.1–1.0)
Monocytes Relative: 10 %
Neutro Abs: 8.8 10*3/uL — ABNORMAL HIGH (ref 1.7–7.7)
Neutrophils Relative %: 74 %
Platelets: 257 10*3/uL (ref 150–400)
RBC: 3.84 MIL/uL — ABNORMAL LOW (ref 4.22–5.81)
RDW: 12.9 % (ref 11.5–15.5)
WBC: 12 10*3/uL — ABNORMAL HIGH (ref 4.0–10.5)
nRBC: 0 % (ref 0.0–0.2)

## 2022-02-19 LAB — COMPREHENSIVE METABOLIC PANEL
ALT: 29 U/L (ref 0–44)
AST: 33 U/L (ref 15–41)
Albumin: 3.8 g/dL (ref 3.5–5.0)
Alkaline Phosphatase: 92 U/L (ref 38–126)
Anion gap: 10 (ref 5–15)
BUN: 23 mg/dL — ABNORMAL HIGH (ref 6–20)
CO2: 24 mmol/L (ref 22–32)
Calcium: 9 mg/dL (ref 8.9–10.3)
Chloride: 103 mmol/L (ref 98–111)
Creatinine, Ser: 0.99 mg/dL (ref 0.61–1.24)
GFR, Estimated: >60 mL/min (ref >60)
Glucose, Bld: 136 mg/dL — ABNORMAL HIGH (ref 70–99)
Potassium: 3.9 mmol/L (ref 3.5–5.1)
Sodium: 137 mmol/L (ref 135–145)
Total Bilirubin: 0.6 mg/dL (ref 0.3–1.2)
Total Protein: 7.4 g/dL (ref 6.5–8.1)

## 2022-02-19 LAB — URINALYSIS, W/ REFLEX TO CULTURE (INFECTION SUSPECTED)
Bilirubin Urine: NEGATIVE
Glucose, UA: NEGATIVE mg/dL
Ketones, ur: NEGATIVE mg/dL
Leukocytes,Ua: NEGATIVE
Nitrite: NEGATIVE
Protein, ur: NEGATIVE mg/dL
RBC / HPF: >50 RBC/hpf (ref 0–5)
Specific Gravity, Urine: 1.014 (ref 1.005–1.030)
pH: 5 (ref 5.0–8.0)

## 2022-02-19 LAB — RESP PANEL BY RT-PCR (RSV, FLU A&B, COVID)  RVPGX2
Influenza A by PCR: NEGATIVE
Influenza B by PCR: NEGATIVE
Resp Syncytial Virus by PCR: NEGATIVE
SARS Coronavirus 2 by RT PCR: NEGATIVE

## 2022-02-19 LAB — LIPASE, BLOOD: Lipase: 42 U/L (ref 11–51)

## 2022-02-19 MED ORDER — KETOROLAC TROMETHAMINE 15 MG/ML IJ SOLN
15.0000 mg | Freq: Once | INTRAMUSCULAR | Status: AC
  Administered 2022-02-19: 15 mg via INTRAVENOUS
  Filled 2022-02-19: qty 1

## 2022-02-19 MED ORDER — FENTANYL CITRATE PF 50 MCG/ML IJ SOSY
50.0000 ug | PREFILLED_SYRINGE | Freq: Once | INTRAMUSCULAR | Status: AC
  Administered 2022-02-19: 50 ug via INTRAVENOUS
  Filled 2022-02-19: qty 1

## 2022-02-19 MED ORDER — LACTATED RINGERS IV BOLUS
1000.0000 mL | Freq: Once | INTRAVENOUS | Status: AC
  Administered 2022-02-19: 1000 mL via INTRAVENOUS

## 2022-02-19 MED ORDER — IBUPROFEN 600 MG PO TABS: 600.0000 mg | ORAL_TABLET | Freq: Four times a day (QID) | ORAL | 0 refills | Status: DC | PRN

## 2022-02-19 NOTE — Discharge Instructions (Addendum)
Your workup today shows a 2 mm stone in your ureter right next to your bladder.  Continue to take your tramadol as scheduled and you may also take ibuprofen (which is prescribed for you) as well as Tylenol.  If your pain worsens or is uncontrolled, you develop a fever, vomiting, urinary tract infection symptoms, or any other new/concerning symptoms then return to the ER or call 911.

## 2022-02-19 NOTE — ED Provider Notes (Signed)
[name] Provider Note   CSN: [id number] Arrival date & time: 02/19/22  9211     History  Chief Complaint  Patient presents with   URI   Flank Pain   Hematuria    David Hartman is a 62 y.o. male.  HPI 62 year old male presents with concern for flulike symptoms as well as flank pain.  He states that about a week ago he started getting flulike symptoms which culminated in a night of vomiting all night on 1/26.  Seem to be better for couple days but then started getting those symptoms back again starting 2 days ago.  Includes low-grade fevers up to 100.3 yesterday.  Has a recurrent sore throat, cough though is no longer having diarrhea or vomiting.  However since yesterday he is also been having right flank pain that feels like when he had kidney stones before.  He also had orange urine that he associates with hematuria and dysuria while peeing.  Right now the pain is a 9.  He states he is taken ibuprofen and Tylenol with no relief.  Home Medications Prior to Admission medications   Medication Sig Start Date End Date Taking? Authorizing Provider  ibuprofen (ADVIL) 600 MG tablet Take 1 tablet (600 mg total) by mouth every 6 (six) hours as needed. 02/19/22  Yes Sherwood Gambler, MD  acetaminophen (TYLENOL) 500 MG tablet Take 1,000 mg by mouth every 6 (six) hours as needed (pain). Patient not taking: Reported on 05/23/2021    [provider]  allopurinol (ZYLOPRIM) 300 MG tablet Take 300 mg by mouth daily.    [provider]  ALPRAZolam Duanne Moron) 1 MG tablet Take 0.5 mg by mouth at bedtime as needed for anxiety.  12/07/13   [provider]  amLODipine (NORVASC) 10 MG tablet Take 10 mg by mouth daily. 11/14/19   [provider]  ascorbic acid (VITAMIN C) 500 MG tablet Take 500 mg by mouth daily with lunch.    [provider]  aspirin EC 81 MG tablet Take 1 tablet (81 mg total) by mouth daily. Swallow  whole. 03/08/21   Chandrasekhar, Lyda Kalata A, MD  atorvastatin (LIPITOR) 80 MG tablet Take 1 tablet (80 mg total) by mouth daily. 03/08/21   Werner Lean, MD  Cholecalciferol (VITAMIN D3 PO) Take 1 tablet by mouth daily.    [provider]  colchicine 0.6 MG tablet Take 0.6 mg by mouth daily as needed (gout). Patient not taking: Reported on 03/20/2021    [provider]  doxycycline (VIBRAMYCIN) 100 MG capsule Take 1 capsule (100 mg total) by mouth 2 (two) times daily. 94/17/40   Delora Fuel, MD  DULoxetine (CYMBALTA) 60 MG capsule TAKE 1 CAPSULE BY MOUTH EVERY DAY 02/24/17   Jamse Arn, MD  ferrous sulfate 325 (65 FE) MG tablet Take 325 mg by mouth daily.    [provider]  lisinopril (PRINIVIL,ZESTRIL) 40 MG tablet Take 40 mg by mouth daily.  04/20/15   [provider]  Magnesium 200 MG TABS Take 200 mg by mouth at bedtime.    [provider]  meclizine (ANTIVERT) 25 MG tablet Take 1 tablet (25 mg total) by mouth 3 (three) times daily as needed for dizziness. 08/20/21   Blanchie Dessert, MD  metFORMIN (GLUCOPHAGE) 500 MG tablet Take 2 tablets by mouth in the morning and at bedtime.    [provider]  Multiple Vitamin (MULTIVITAMIN WITH MINERALS) TABS tablet Take 1  tablet by mouth daily. Men over 50    [provider]  Multiple Vitamins-Minerals (ZINC PO) Take 1 tablet by mouth daily with lunch.    [provider]  omeprazole (PRILOSEC OTC) 20 MG tablet Take 20 mg by mouth daily.    [provider]  propranolol (INDERAL) 40 MG tablet Take 40 mg by mouth daily.     [provider]  tiZANidine (ZANAFLEX) 4 MG tablet Take 4 mg by mouth at bedtime. 11/14/19   [provider]  traMADol (ULTRAM) 50 MG tablet Take 50 mg by mouth 2 (two) times daily. For back pain    [provider]  zolpidem (AMBIEN) 10 MG tablet Take 5 mg by mouth at bedtime.  11/27/17   [provider]       Allergies    Pregabalin, Simvastatin, and Trazodone and nefazodone    Review of Systems   Review of Systems  Constitutional:  Positive for fever.  HENT:  Positive for sore throat.   Respiratory:  Positive for cough.   Gastrointestinal:  Positive for abdominal pain.  Genitourinary:  Positive for dysuria, flank pain and hematuria.    Physical Exam Updated Vital Signs BP 132/73   Pulse 63   Temp 98 F (36.7 C) (Oral)   Resp 18   SpO2 98%  Physical Exam Vitals and nursing note reviewed.  Constitutional:      Appearance: He is well-developed. He is obese.  HENT:     Head: Normocephalic and atraumatic.     Mouth/Throat:     Pharynx: Oropharynx is clear. No oropharyngeal exudate or posterior oropharyngeal erythema.  Cardiovascular:     Rate and Rhythm: Normal rate and regular rhythm.     Heart sounds: Normal heart sounds.  Pulmonary:     Effort: Pulmonary effort is normal.     Breath sounds: Normal breath sounds. No wheezing.  Abdominal:     Palpations: Abdomen is soft.     Tenderness: There is abdominal tenderness (mild) in the right upper quadrant and right lower quadrant. There is no right CVA tenderness or left CVA tenderness.    Skin:    General: Skin is warm and dry.  Neurological:     Mental Status: He is alert.     ED Results / Procedures / Treatments   Labs (all labs ordered are listed, but only abnormal results are displayed) Labs Reviewed  COMPREHENSIVE METABOLIC PANEL - Abnormal; Notable for the following components:      Result Value   Glucose, Bld 136 (*)    BUN 23 (*)    All other components within normal limits  CBC WITH DIFFERENTIAL/PLATELET - Abnormal; Notable for the following components:   WBC 12.0 (*)    RBC 3.84 (*)    Hemoglobin 11.8 (*)    HCT 35.5 (*)    Neutro Abs 8.8 (*)    Monocytes Absolute 1.2 (*)    All other components within normal limits  URINALYSIS, W/ REFLEX TO CULTURE (INFECTION SUSPECTED) - Abnormal; Notable for the  following components:   APPearance HAZY (*)    Hgb urine dipstick LARGE (*)    Bacteria, UA RARE (*)    All other components within normal limits  RESP PANEL BY RT-PCR (RSV, FLU A&B, COVID)  RVPGX2  LIPASE, BLOOD    EKG None  Radiology CT Renal Stone Study  Result Date: 02/19/2022 CLINICAL DATA:  62 year old male presents for evaluation of abdominal pain and flank pain. History of  prostate cancer. * Tracking Code: BO * EXAM: CT ABDOMEN AND PELVIS WITHOUT CONTRAST TECHNIQUE: Multidetector CT imaging of the abdomen and pelvis was performed following the standard protocol without IV contrast. RADIATION DOSE REDUCTION: This exam was performed according to the departmental dose-optimization program which includes automated exposure control, adjustment of the mA and/or kV according to patient size and/or use of iterative reconstruction technique. COMPARISON:  July 21, 2020. FINDINGS: Lower chest: Incidental imaging of the lung bases with signs cardiomegaly and coronary artery disease, heart is incompletely imaged. Heart size mildly enlarged. No pericardial effusion to the extent evaluated. Basilar atelectasis. No chest wall abnormality. Hepatobiliary: Mildly lobular hepatic contours with fissural widening. No visible lesion on noncontrast imaging. Cholelithiasis with large gallstone in the dependent gallbladder measuring approximately 17 18 mm. Adjacent smaller gallstone. No gross biliary duct dilation. Pancreas: Pancreas with normal contours, no signs of inflammation or peripancreatic fluid. Spleen: Normal. Adrenals/Urinary Tract: Adrenal glands are normal. Moderate perinephric stranding and mild RIGHT hydronephrosis and ureteral dilation due to a RIGHT UVJ calculus measuring approximately 2 mm (image 82/2) post prostatectomy appearance of the urinary bladder without signs of distension or perivesical stranding. No LEFT ureteral calculi. No additional signs of nephrolithiasis. Smooth renal contours.  Stomach/Bowel: Post appendectomy without signs of acute gastrointestinal process. Vascular/Lymphatic: Aortic atherosclerosis. No sign of aneurysm. Smooth contour of the IVC. There is no gastrohepatic or hepatoduodenal ligament lymphadenopathy. No retroperitoneal or mesenteric lymphadenopathy. No pelvic sidewall lymphadenopathy. Limited assessment of vascular structures without intravenous contrast. Atherosclerotic changes are mild. Ovoid peripherally calcified low-density area in the LEFT hemipelvis previously shown which have no enhancement measuring 4.2 x 3.7 cm previously 4.2 x 4.0 cm likely peripherally calcified lymphocele post prostatectomy. Reproductive: Post prostatectomy. Other: No ascites.  No pneumoperitoneum. Musculoskeletal: No acute bone finding. No destructive bone process. Spinal degenerative changes. IMPRESSION: 1. Moderate RIGHT perinephric stranding and mild RIGHT hydronephrosis and ureteral dilation due to a RIGHT UVJ calculus measuring approximately 2 mm. 2. No additional signs of nephrolithiasis. 3. Cholelithiasis. 4. Mildly lobular hepatic contours with fissural widening. Correlate with any clinical or laboratory evidence of liver disease. 5. Ovoid peripherally calcified low-density area in the LEFT hemipelvis previously shown which have no enhancement measuring 4.2 x 3.7 cm previously 4.2 x 4.0 cm likely peripherally calcified lymphocele post prostatectomy. 6. Coronary artery disease and aortic atherosclerosis. Aortic Atherosclerosis (ICD10-I70.0). Electronically Signed   By: Zetta Bills M.D.   On: 02/19/2022 09:29   DG Chest 2 View  Result Date: 02/19/2022 CLINICAL DATA:  Cough. URI symptoms including body aches, chills, headache. EXAM: CHEST - 2 VIEW COMPARISON:  12/09/2021 FINDINGS: Shallow lung inflation. There is streaky opacity in both lung bases, consistent with hypoaeration or minimal atelectasis. There are no focal consolidations or pleural effusions. There is no pulmonary  edema. There is minimal midthoracic spondylosis. IMPRESSION: Minimal bibasilar atelectasis.  Low lung volumes. Electronically Signed   By: Nolon Nations M.D.   On: 02/19/2022 08:25    Procedures Procedures    Medications Ordered in ED Medications  ketorolac (TORADOL) 15 MG/ML injection 15 mg (has no administration in time range)  lactated ringers bolus 1,000 mL (0 mLs Intravenous Stopped 02/19/22 0950)  fentaNYL (SUBLIMAZE) injection 50 mcg (50 mcg Intravenous Given 02/19/22 0751)    ED Course/ Medical Decision Making/ A&P                             Medical Decision Making Amount and/or  Complexity of Data Reviewed Labs: ordered.    Details: COVID/flu/RSV negative.  WBC mildly elevated, likely reactive.  UA shows blood but no signs of infection. Radiology: ordered and independent interpretation performed.    Details: Chest x-ray without pneumonia.  CT with a right ureteral stone with mild right hydro.  Risk Prescription drug management.   Patient presents with right ureteral colic as a chief complaint.  He is also been dealing with a mild URI though he has not had a fever since yesterday and no fever here.  I do not suspect that his ureteral stone is infected.  He is well-appearing.  He was given some fentanyl and feels a lot better.  He will also be given a dose of Toradol.  He is on extended release tramadol chronically and we discussed potential harms of adding further narcotics to this for pain control.  He would prefer to just do NSAIDs for now.  Should pass on its own.  Will have him follow-up with urology just in case but otherwise appears stable for discharge home with return precautions.        Final Clinical Impression(s) / ED Diagnoses Final diagnoses:  Ureteral colic    Rx / DC Orders ED Discharge Orders          Ordered    ibuprofen (ADVIL) 600 MG tablet  Every 6 hours PRN        02/19/22 1001              Sherwood Gambler, MD 02/19/22 1004

## 2022-02-19 NOTE — ED Triage Notes (Signed)
Pt c/o URI symptoms including body aches, chills, headache, N/V, and sore throat since last week. Has known contact with persons diagnosed with Flu and Strep throat.   Pt also c/o R sided flank pain and hematuria which is new. Pt reports hx of kidney stones and pain feels similar, but no previous hematuria with stones.

## 2022-02-27 ENCOUNTER — Other Ambulatory Visit: Payer: Self-pay | Admitting: Internal Medicine

## 2022-02-28 DIAGNOSIS — S32020S Wedge compression fracture of second lumbar vertebra, sequela: Secondary | ICD-10-CM | POA: Diagnosis not present

## 2022-02-28 DIAGNOSIS — G894 Chronic pain syndrome: Secondary | ICD-10-CM | POA: Diagnosis not present

## 2022-02-28 DIAGNOSIS — M546 Pain in thoracic spine: Secondary | ICD-10-CM | POA: Diagnosis not present

## 2022-03-03 DIAGNOSIS — L300 Nummular dermatitis: Secondary | ICD-10-CM | POA: Diagnosis not present

## 2022-03-20 DIAGNOSIS — L821 Other seborrheic keratosis: Secondary | ICD-10-CM | POA: Diagnosis not present

## 2022-03-20 DIAGNOSIS — L300 Nummular dermatitis: Secondary | ICD-10-CM | POA: Diagnosis not present

## 2022-03-26 ENCOUNTER — Other Ambulatory Visit: Payer: Medicare Other

## 2022-03-26 DIAGNOSIS — C61 Malignant neoplasm of prostate: Secondary | ICD-10-CM

## 2022-03-26 DIAGNOSIS — R7303 Prediabetes: Secondary | ICD-10-CM

## 2022-03-26 DIAGNOSIS — E785 Hyperlipidemia, unspecified: Secondary | ICD-10-CM | POA: Diagnosis not present

## 2022-03-26 DIAGNOSIS — I10 Essential (primary) hypertension: Secondary | ICD-10-CM | POA: Diagnosis not present

## 2022-03-27 ENCOUNTER — Other Ambulatory Visit: Payer: Self-pay | Admitting: Family Medicine

## 2022-03-27 DIAGNOSIS — R739 Hyperglycemia, unspecified: Secondary | ICD-10-CM

## 2022-03-27 LAB — COMPLETE METABOLIC PANEL WITH GFR
AG Ratio: 1.6 (calc) (ref 1.0–2.5)
ALT: 28 U/L (ref 9–46)
AST: 26 U/L (ref 10–35)
Albumin: 3.9 g/dL (ref 3.6–5.1)
Alkaline phosphatase (APISO): 81 U/L (ref 35–144)
BUN: 19 mg/dL (ref 7–25)
CO2: 28 mmol/L (ref 20–32)
Calcium: 9 mg/dL (ref 8.6–10.3)
Chloride: 105 mmol/L (ref 98–110)
Creat: 0.8 mg/dL (ref 0.70–1.35)
Globulin: 2.4 g/dL (calc) (ref 1.9–3.7)
Glucose, Bld: 118 mg/dL — ABNORMAL HIGH (ref 65–99)
Potassium: 4.1 mmol/L (ref 3.5–5.3)
Sodium: 141 mmol/L (ref 135–146)
Total Bilirubin: 0.4 mg/dL (ref 0.2–1.2)
Total Protein: 6.3 g/dL (ref 6.1–8.1)
eGFR: 101 mL/min/{1.73_m2} (ref >=60)

## 2022-03-27 LAB — CBC WITH DIFFERENTIAL/PLATELET
Absolute Monocytes: 747 cells/uL (ref 200–950)
Basophils Absolute: 42 cells/uL (ref 0–200)
Basophils Relative: 0.5 %
Eosinophils Absolute: 282 cells/uL (ref 15–500)
Eosinophils Relative: 3.4 %
HCT: 33.6 % — ABNORMAL LOW (ref 38.5–50.0)
Hemoglobin: 11.2 g/dL — ABNORMAL LOW (ref 13.2–17.1)
Lymphs Abs: 2606 cells/uL (ref 850–3900)
MCH: 30.7 pg (ref 27.0–33.0)
MCHC: 33.3 g/dL (ref 32.0–36.0)
MCV: 92.1 fL (ref 80.0–100.0)
MPV: 9.6 fL (ref 7.5–12.5)
Monocytes Relative: 9 %
Neutro Abs: 4623 cells/uL (ref 1500–7800)
Neutrophils Relative %: 55.7 %
Platelets: 224 10*3/uL (ref 140–400)
RBC: 3.65 10*6/uL — ABNORMAL LOW (ref 4.20–5.80)
RDW: 13 % (ref 11.0–15.0)
Total Lymphocyte: 31.4 %
WBC: 8.3 10*3/uL (ref 3.8–10.8)

## 2022-03-27 LAB — TEST AUTHORIZATION

## 2022-03-27 LAB — PSA: PSA: 0.24 ng/mL (ref <=4.00)

## 2022-03-27 LAB — HEMOGLOBIN A1C
Hgb A1c MFr Bld: 6.1 % of total Hgb — ABNORMAL HIGH (ref <5.7)
Mean Plasma Glucose: 128 mg/dL
eAG (mmol/L): 7.1 mmol/L

## 2022-03-27 LAB — LIPID PANEL
Cholesterol: 88 mg/dL (ref <200)
HDL: 41 mg/dL (ref >=40)
LDL Cholesterol (Calc): 32 mg/dL (calc)
Non-HDL Cholesterol (Calc): 47 mg/dL (calc) (ref <130)
Total CHOL/HDL Ratio: 2.1 (calc) (ref <5.0)
Triglycerides: 74 mg/dL (ref <150)

## 2022-04-02 ENCOUNTER — Ambulatory Visit (INDEPENDENT_AMBULATORY_CARE_PROVIDER_SITE_OTHER): Payer: Medicare Other | Admitting: Family Medicine

## 2022-04-02 ENCOUNTER — Encounter: Payer: Self-pay | Admitting: Family Medicine

## 2022-04-02 VITALS — BP 130/80 | HR 67 | Temp 98.4°F | Ht 70.0 in | Wt 251.0 lb

## 2022-04-02 DIAGNOSIS — E119 Type 2 diabetes mellitus without complications: Secondary | ICD-10-CM | POA: Insufficient documentation

## 2022-04-02 DIAGNOSIS — Z79899 Other long term (current) drug therapy: Secondary | ICD-10-CM

## 2022-04-02 DIAGNOSIS — Z23 Encounter for immunization: Secondary | ICD-10-CM | POA: Diagnosis not present

## 2022-04-02 DIAGNOSIS — I152 Hypertension secondary to endocrine disorders: Secondary | ICD-10-CM | POA: Diagnosis not present

## 2022-04-02 DIAGNOSIS — E1159 Type 2 diabetes mellitus with other circulatory complications: Secondary | ICD-10-CM

## 2022-04-02 DIAGNOSIS — Z7689 Persons encountering health services in other specified circumstances: Secondary | ICD-10-CM | POA: Insufficient documentation

## 2022-04-02 MED ORDER — METFORMIN HCL ER 500 MG PO TB24: 1000.0000 mg | ORAL_TABLET | Freq: Two times a day (BID) | ORAL | 1 refills | Status: DC
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
## 2022-04-02 NOTE — Patient Instructions (Signed)
It was great to meet you today and I'm excited to have you join the [facility] practice. I hope you had a positive experience today! If you feel so inclined, please feel free to recommend our practice to friends and family. Taym Twist, FNP-C  

## 2022-04-02 NOTE — Assessment & Plan Note (Signed)
Well controlled. Continue Amlodipine '10mg'$  daily, Aspirin '81mg'$  daily, Lipitor '80mg'$  daily, Lisinopril '40mg'$  daily.

## 2022-04-02 NOTE — Assessment & Plan Note (Signed)

## 2022-04-02 NOTE — Assessment & Plan Note (Signed)
Well controlled. A1c 6.1%. Continue Metformin ER '1000mg'$  BID. Check B12 at next lab draw. Foot exam and urine acr done today. He will schedule retina exam with ophthalmology.

## 2022-04-03 DIAGNOSIS — Z79899 Other long term (current) drug therapy: Secondary | ICD-10-CM | POA: Diagnosis not present

## 2022-04-03 DIAGNOSIS — E119 Type 2 diabetes mellitus without complications: Secondary | ICD-10-CM | POA: Diagnosis not present

## 2022-04-04 ENCOUNTER — Ambulatory Visit (INDEPENDENT_AMBULATORY_CARE_PROVIDER_SITE_OTHER): Payer: Medicare Other | Admitting: Family Medicine

## 2022-04-04 ENCOUNTER — Encounter: Payer: Self-pay | Admitting: Family Medicine

## 2022-04-04 VITALS — BP 118/62 | HR 62 | Temp 97.9°F | Ht 70.0 in | Wt 247.8 lb

## 2022-04-04 DIAGNOSIS — J329 Chronic sinusitis, unspecified: Secondary | ICD-10-CM | POA: Diagnosis not present

## 2022-04-04 LAB — MICROALBUMIN / CREATININE URINE RATIO
Creatinine, Urine: 139 mg/dL (ref 20–320)
Microalb Creat Ratio: 10 mcg/mg creat (ref <30)
Microalb, Ur: 1.4 mg/dL

## 2022-04-04 MED ORDER — AMOXICILLIN-POT CLAVULANATE 875-125 MG PO TABS: 1.0000 | ORAL_TABLET | Freq: Two times a day (BID) | ORAL | 0 refills | Status: DC
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
## 2022-04-05 LAB — DRUG MONITOR, PANEL 1, W/CONF, URINE
Alphahydroxyalprazolam: 114 ng/mL — ABNORMAL HIGH (ref <25)
Alphahydroxymidazolam: NEGATIVE ng/mL (ref <50)
Alphahydroxytriazolam: NEGATIVE ng/mL (ref <50)
Aminoclonazepam: NEGATIVE ng/mL (ref <25)
Amphetamines: NEGATIVE ng/mL (ref <500)
Barbiturates: NEGATIVE ng/mL (ref <300)
Benzodiazepines: POSITIVE ng/mL — AB (ref <100)
Cocaine Metabolite: NEGATIVE ng/mL (ref <150)
Codeine: NEGATIVE ng/mL (ref <50)
Creatinine: 141.9 mg/dL (ref >=20.0)
Hydrocodone: NEGATIVE ng/mL (ref <50)
Hydromorphone: NEGATIVE ng/mL (ref <50)
Hydroxyethylflurazepam: NEGATIVE ng/mL (ref <50)
Lorazepam: NEGATIVE ng/mL (ref <50)
Marijuana Metabolite: NEGATIVE ng/mL (ref <20)
Methadone Metabolite: NEGATIVE ng/mL (ref <100)
Morphine: NEGATIVE ng/mL (ref <50)
Nordiazepam: NEGATIVE ng/mL (ref <50)
Norhydrocodone: NEGATIVE ng/mL (ref <50)
Opiates: NEGATIVE ng/mL (ref <100)
Oxazepam: NEGATIVE ng/mL (ref <50)
Oxidant: NEGATIVE ug/mL (ref <200)
Oxycodone: NEGATIVE ng/mL (ref <100)
Phencyclidine: NEGATIVE ng/mL (ref <25)
Temazepam: NEGATIVE ng/mL (ref <50)
pH: 5.7 (ref 4.5–9.0)

## 2022-04-05 LAB — DM TEMPLATE

## 2022-04-22 ENCOUNTER — Telehealth: Payer: Self-pay

## 2022-04-22 NOTE — Telephone Encounter (Signed)
Prescription Request  04/22/2022  LOV: 04/02/22  What is the name of the medication or equipment? zolpidem (AMBIEN) 10 MG tablet [id number]  Have you contacted your pharmacy to request a refill? No   Which pharmacy would you like this sent to?   [id number] - [address], [address] [place], [place] [address] Phone: [phone]  Fax: [phone]    Patient notified that their request is being sent to the clinical staff for review and that they should receive a response within 2 business days.   Please advise at [facility] [phone]

## 2022-04-25 NOTE — Telephone Encounter (Signed)
LVM for pt to schedule an appt w/Amber Dimas Aguas, FNP for med refill Remus Loffler).

## 2022-04-28 ENCOUNTER — Telehealth: Payer: Self-pay | Admitting: Family Medicine

## 2022-04-28 NOTE — Telephone Encounter (Signed)
Contacted David Hartman to schedule their annual wellness visit. Appointment made for 05/01/2022.  Thank you,  [name],  AMB Clinical Support [facility] AWV Program Direct Dial ??[phone]

## 2022-04-30 ENCOUNTER — Ambulatory Visit (INDEPENDENT_AMBULATORY_CARE_PROVIDER_SITE_OTHER): Payer: Medicare Other | Admitting: Family Medicine

## 2022-04-30 VITALS — BP 120/84 | HR 78 | Temp 97.8°F | Ht 70.0 in | Wt 246.0 lb

## 2022-04-30 DIAGNOSIS — I152 Hypertension secondary to endocrine disorders: Secondary | ICD-10-CM

## 2022-04-30 DIAGNOSIS — G8929 Other chronic pain: Secondary | ICD-10-CM | POA: Diagnosis not present

## 2022-04-30 DIAGNOSIS — E1159 Type 2 diabetes mellitus with other circulatory complications: Secondary | ICD-10-CM | POA: Diagnosis not present

## 2022-04-30 DIAGNOSIS — I2510 Atherosclerotic heart disease of native coronary artery without angina pectoris: Secondary | ICD-10-CM

## 2022-04-30 DIAGNOSIS — F329 Major depressive disorder, single episode, unspecified: Secondary | ICD-10-CM | POA: Diagnosis not present

## 2022-04-30 DIAGNOSIS — K219 Gastro-esophageal reflux disease without esophagitis: Secondary | ICD-10-CM | POA: Diagnosis not present

## 2022-04-30 DIAGNOSIS — E119 Type 2 diabetes mellitus without complications: Secondary | ICD-10-CM

## 2022-04-30 DIAGNOSIS — M546 Pain in thoracic spine: Secondary | ICD-10-CM

## 2022-04-30 DIAGNOSIS — E785 Hyperlipidemia, unspecified: Secondary | ICD-10-CM

## 2022-04-30 MED ORDER — ZOLPIDEM TARTRATE 10 MG PO TABS: 10.0000 mg | ORAL_TABLET | Freq: Every day | ORAL | 2 refills | Status: DC

## 2022-04-30 MED ORDER — ALLOPURINOL 300 MG PO TABS: 300.0000 mg | ORAL_TABLET | Freq: Every day | ORAL | 1 refills | Status: DC

## 2022-04-30 MED ORDER — LISINOPRIL 40 MG PO TABS: 40.0000 mg | ORAL_TABLET | Freq: Every day | ORAL | 3 refills | Status: DC

## 2022-04-30 MED ORDER — DULOXETINE HCL 30 MG PO CPEP: 30.0000 mg | ORAL_CAPSULE | Freq: Every day | ORAL | 0 refills | Status: DC

## 2022-04-30 MED ORDER — AMLODIPINE BESYLATE 10 MG PO TABS: 10.0000 mg | ORAL_TABLET | Freq: Every day | ORAL | 1 refills | Status: DC

## 2022-04-30 MED ORDER — ALPRAZOLAM 1 MG PO TABS: 0.5000 mg | ORAL_TABLET | Freq: Every evening | ORAL | 0 refills | Status: DC | PRN

## 2022-04-30 MED ORDER — PROPRANOLOL HCL 40 MG PO TABS: 40.0000 mg | ORAL_TABLET | Freq: Every day | ORAL | 3 refills | Status: DC
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
## 2022-05-01 ENCOUNTER — Ambulatory Visit (INDEPENDENT_AMBULATORY_CARE_PROVIDER_SITE_OTHER): Payer: Medicare Other

## 2022-05-01 VITALS — Ht 70.0 in | Wt 247.0 lb

## 2022-05-01 DIAGNOSIS — Z0000 Encounter for general adult medical examination without abnormal findings: Secondary | ICD-10-CM

## 2022-05-01 DIAGNOSIS — I2510 Atherosclerotic heart disease of native coronary artery without angina pectoris: Secondary | ICD-10-CM | POA: Insufficient documentation

## 2022-05-01 NOTE — Assessment & Plan Note (Signed)
Well controlled. A1c 6.1%. Continue Metformin ER 1000mg  BID.

## 2022-05-01 NOTE — Assessment & Plan Note (Signed)
Goal LDL <70. Coronary CT revealed coronary calcium score of 1016, 97th percentile for age/sex/race matched control, mild non-obstructive CAD (25-49%). Continue Atorvastatin and ASA per cardiology.
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
## 2022-05-01 NOTE — Assessment & Plan Note (Signed)
Well controlled on Omeprazole, may trial off but he is reluctant.
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
## 2022-05-01 NOTE — Assessment & Plan Note (Signed)
Well controlled. Continue Amlodipine 10mg daily, Aspirin 81mg daily, Lipitor 80mg daily, Lisinopril 40mg daily.  

## 2022-05-01 NOTE — Patient Instructions (Signed)
David Hartman , Thank you for taking time to come for your Medicare Wellness Visit. I appreciate your ongoing commitment to your health goals. Please review the following plan we discussed and let me know if I can assist you in the future.   These are the goals we discussed:  Goals      Remain active and independent        This is a list of the screening recommended for you and due dates:  Health Maintenance  Topic Date Due   Eye exam for diabetics  Never done   COVID-19 Vaccine (1) 05/16/2022*   Zoster (Shingles) Vaccine (1 of 2) 07/30/2022*   Hepatitis C Screening: USPSTF Recommendation to screen - Ages 18-79 yo.  04/30/2023*   HIV Screening  04/30/2023*   Flu Shot  08/21/2022   Hemoglobin A1C  09/26/2022   Yearly kidney function blood test for diabetes  03/26/2023   Complete foot exam   04/02/2023   Yearly kidney health urinalysis for diabetes  04/03/2023   Medicare Annual Wellness Visit  05/01/2023   DTaP/Tdap/Td vaccine (3 - Td or Tdap) 09/09/2025   Colon Cancer Screening  04/04/2026   HPV Vaccine  Aged Out  *Topic was postponed. The date shown is not the original due date.    Advanced directives: Please bring a copy of your health care power of attorney and living will to the office to be added to your chart at your convenience.   Conditions/risks identified: Aim for 30 minutes of exercise or brisk walking, 6-8 glasses of water, and 5 servings of fruits and vegetables each day.   Next appointment: Follow up in one year for your annual wellness visit   Preventive Care 40-64 Years, Male Preventive care refers to lifestyle choices and visits with your health care provider that can promote health and wellness. What does preventive care include? A yearly physical exam. This is also called an annual well check. Dental exams once or twice a year. Routine eye exams. Ask your health care provider how often you should have your eyes checked. Personal lifestyle choices,  including: Daily care of your teeth and gums. Regular physical activity. Eating a healthy diet. Avoiding tobacco and drug use. Limiting alcohol use. Practicing safe sex. Taking low-dose aspirin every day starting at age 33. What happens during an annual well check? The services and screenings done by your health care provider during your annual well check will depend on your age, overall health, lifestyle risk factors, and family history of disease. Counseling  Your health care provider may ask you questions about your: Alcohol use. Tobacco use. Drug use. Emotional well-being. Home and relationship well-being. Sexual activity. Eating habits. Work and work Astron[employer]. Screening  You may have the following tests or measurements: Height, weight, and BMI. Blood pressure. Lipid and cholesterol levels. These may be checked every 5 years, or more frequently if you are over 77 years old. Skin check. Lung cancer screening. You may have this screening every year starting at age 22 if you have a 30-pack-year history of smoking and currently smoke or have quit within the past 15 years. Fecal occult blood test (FOBT) of the stool. You may have this test every year starting at age 23. Flexible sigmoidoscopy or colonoscopy. You may have a sigmoidoscopy every 5 years or a colonoscopy every 10 years starting at age 40. Prostate cancer screening. Recommendations will vary depending on your family history and other risks. Hepatitis C blood test. Hepatitis B blood test.  Sexually transmitted disease (STD) testing. Diabetes screening. This is done by checking your blood sugar (glucose) after you have not eaten for a while (fasting). You may have this done every 1-3 years. Discuss your test results, treatment options, and if necessary, the need for more tests with your health care provider. Vaccines  Your health care provider may recommend certain vaccines, such as: Influenza vaccine. This is  recommended every year. Tetanus, diphtheria, and acellular pertussis (Tdap, Td) vaccine. You may need a Td booster every 10 years. Zoster vaccine. You may need this after age 27. Pneumococcal 13-valent conjugate (PCV13) vaccine. You may need this if you have certain conditions and have not been vaccinated. Pneumococcal polysaccharide (PPSV23) vaccine. You may need one or two doses if you smoke cigarettes or if you have certain conditions. Talk to your health care provider about which screenings and vaccines you need and how often you need them. This information is not intended to replace advice given to you by your health care provider. Make sure you discuss any questions you have with your health care provider. Document Released: 02/02/2015 Document Revised: 09/26/2015 Document Reviewed: 11/07/2014 Elsevier Interactive Patient Education  2017 [employer].  Fall Prevention in the Home Falls can cause injuries. They can happen to people of all ages. There are many things you can do to make your home safe and to help prevent falls. What can I do on the outside of my home? Regularly fix the edges of walkways and driveways and fix any cracks. Remove anything that might make you trip as you walk through a door, such as a raised step or threshold. Trim any bushes or trees on the path to your home. Use bright outdoor lighting. Clear any walking paths of anything that might make someone trip, such as rocks or tools. Regularly check to see if handrails are loose or broken. Make sure that both sides of any steps have handrails. Any raised decks and porches should have guardrails on the edges. Have any leaves, snow, or ice cleared regularly. Use sand or salt on walking paths during winter. Clean up any spills in your garage right away. This includes oil or grease spills. What can I do in the bathroom? Use night lights. Install grab bars by the toilet and in the tub and shower. Do not use towel bars  as grab bars. Use non-skid mats or decals in the tub or shower. If you need to sit down in the shower, use a plastic, non-slip stool. Keep the floor dry. Clean up any water that spills on the floor as soon as it happens. Remove soap buildup in the tub or shower regularly. Attach bath mats securely with double-sided non-slip rug tape. Do not have throw rugs and other things on the floor that can make you trip. What can I do in the bedroom? Use night lights. Make sure that you have a light by your bed that is easy to reach. Do not use any sheets or blankets that are too big for your bed. They should not hang down onto the floor. Have a firm chair that has side arms. You can use this for support while you get dressed. Do not have throw rugs and other things on the floor that can make you trip. What can I do in the kitchen? Clean up any spills right away. Avoid walking on wet floors. Keep items that you use a lot in easy-to-reach places. If you need to reach something above you, use a  strong step stool that has a grab bar. Keep electrical cords out of the way. Do not use floor polish or wax that makes floors slippery. If you must use wax, use non-skid floor wax. Do not have throw rugs and other things on the floor that can make you trip. What can I do with my stairs? Do not leave any items on the stairs. Make sure that there are handrails on both sides of the stairs and use them. Fix handrails that are broken or loose. Make sure that handrails are as long as the stairways. Check any carpeting to make sure that it is firmly attached to the stairs. Fix any carpet that is loose or worn. Avoid having throw rugs at the top or bottom of the stairs. If you do have throw rugs, attach them to the floor with carpet tape. Make sure that you have a light switch at the top of the stairs and the bottom of the stairs. If you do not have them, ask someone to add them for you. What else can I do to help  prevent falls? Wear shoes that: Do not have high heels. Have rubber bottoms. Are comfortable and fit you well. Are closed at the toe. Do not wear sandals. If you use a stepladder: Make sure that it is fully opened. Do not climb a closed stepladder. Make sure that both sides of the stepladder are locked into place. Ask someone to hold it for you, if possible. Clearly mark and make sure that you can see: Any grab bars or handrails. First and last steps. Where the edge of each step is. Use tools that help you move around (mobility aids) if they are needed. These include: Canes. Walkers. Scooters. Crutches. Turn on the lights when you go into a dark area. Replace any light bulbs as soon as they burn out. Set up your furniture so you have a clear path. Avoid moving your furniture around. If any of your floors are uneven, fix them. If there are any pets around you, be aware of where they are. Review your medicines with your doctor. Some medicines can make you feel dizzy. This can increase your chance of falling. Ask your doctor what other things that you can do to help prevent falls. This information is not intended to replace advice given to you by your health care provider. Make sure you discuss any questions you have with your health care provider. Document Released: 11/02/2008 Document Revised: 06/14/2015 Document Reviewed: 02/10/2014 Elsevier Interactive Patient Education  2017 [employer].

## 2022-05-01 NOTE — Assessment & Plan Note (Addendum)
Continue statin to reduce ASCVD risk.

## 2022-05-01 NOTE — Assessment & Plan Note (Addendum)
Chronic, managed by pain management. Will continue Tizanidine 4mg  TID and Tramadol 300mg  daily. He is reluctant to discontinue these. Also requiring Ambien to help sleep due to his pain, encourage reduction in use if possible.

## 2022-06-12 DIAGNOSIS — G894 Chronic pain syndrome: Secondary | ICD-10-CM | POA: Diagnosis not present

## 2022-06-12 DIAGNOSIS — M7918 Myalgia, other site: Secondary | ICD-10-CM | POA: Diagnosis not present

## 2022-06-12 DIAGNOSIS — Z79899 Other long term (current) drug therapy: Secondary | ICD-10-CM | POA: Diagnosis not present

## 2022-06-24 ENCOUNTER — Ambulatory Visit: Payer: Medicare Other | Attending: Internal Medicine | Admitting: Internal Medicine

## 2022-06-24 ENCOUNTER — Telehealth: Payer: Self-pay

## 2022-06-24 ENCOUNTER — Emergency Department (HOSPITAL_COMMUNITY): Payer: Medicare Other

## 2022-06-24 ENCOUNTER — Encounter (HOSPITAL_COMMUNITY): Payer: Self-pay

## 2022-06-24 ENCOUNTER — Encounter: Payer: Self-pay | Admitting: Internal Medicine

## 2022-06-24 ENCOUNTER — Other Ambulatory Visit: Payer: Self-pay

## 2022-06-24 ENCOUNTER — Emergency Department (HOSPITAL_COMMUNITY)
Admission: EM | Admit: 2022-06-24 | Discharge: 2022-06-24 | Disposition: A | Discharge status: Home / Self Care | Payer: Medicare Other | Attending: Emergency Medicine | Admitting: Emergency Medicine

## 2022-06-24 VITALS — BP 87/55 | HR 62 | Ht 70.0 in | Wt 250.8 lb

## 2022-06-24 DIAGNOSIS — R001 Bradycardia, unspecified: Secondary | ICD-10-CM | POA: Diagnosis not present

## 2022-06-24 DIAGNOSIS — E785 Hyperlipidemia, unspecified: Secondary | ICD-10-CM | POA: Diagnosis not present

## 2022-06-24 DIAGNOSIS — I2510 Atherosclerotic heart disease of native coronary artery without angina pectoris: Secondary | ICD-10-CM | POA: Diagnosis not present

## 2022-06-24 DIAGNOSIS — I959 Hypotension, unspecified: Secondary | ICD-10-CM | POA: Diagnosis not present

## 2022-06-24 DIAGNOSIS — Z7982 Long term (current) use of aspirin: Secondary | ICD-10-CM | POA: Diagnosis not present

## 2022-06-24 DIAGNOSIS — Z743 Need for continuous supervision: Secondary | ICD-10-CM | POA: Diagnosis not present

## 2022-06-24 DIAGNOSIS — R400 Somnolence: Secondary | ICD-10-CM | POA: Diagnosis not present

## 2022-06-24 DIAGNOSIS — I499 Cardiac arrhythmia, unspecified: Secondary | ICD-10-CM | POA: Diagnosis not present

## 2022-06-24 DIAGNOSIS — I152 Hypertension secondary to endocrine disorders: Secondary | ICD-10-CM

## 2022-06-24 DIAGNOSIS — Z7984 Long term (current) use of oral hypoglycemic drugs: Secondary | ICD-10-CM | POA: Diagnosis not present

## 2022-06-24 DIAGNOSIS — E1159 Type 2 diabetes mellitus with other circulatory complications: Secondary | ICD-10-CM | POA: Diagnosis not present

## 2022-06-24 DIAGNOSIS — R42 Dizziness and giddiness: Secondary | ICD-10-CM | POA: Diagnosis present

## 2022-06-24 LAB — CBC
HCT: 37.8 % — ABNORMAL LOW (ref 39.0–52.0)
Hemoglobin: 11.8 g/dL — ABNORMAL LOW (ref 13.0–17.0)
MCH: 30.3 pg (ref 26.0–34.0)
MCHC: 31.2 g/dL (ref 30.0–36.0)
MCV: 96.9 fL (ref 80.0–100.0)
Platelets: 233 10*3/uL (ref 150–400)
RBC: 3.9 MIL/uL — ABNORMAL LOW (ref 4.22–5.81)
RDW: 13 % (ref 11.5–15.5)
WBC: 9.5 10*3/uL (ref 4.0–10.5)
nRBC: 0 % (ref 0.0–0.2)

## 2022-06-24 LAB — BASIC METABOLIC PANEL
Anion gap: 11 (ref 5–15)
BUN: 25 mg/dL — ABNORMAL HIGH (ref 8–23)
CO2: 21 mmol/L — ABNORMAL LOW (ref 22–32)
Calcium: 8.8 mg/dL — ABNORMAL LOW (ref 8.9–10.3)
Chloride: 105 mmol/L (ref 98–111)
Creatinine, Ser: 0.94 mg/dL (ref 0.61–1.24)
GFR, Estimated: >60 mL/min (ref >60)
Glucose, Bld: 114 mg/dL — ABNORMAL HIGH (ref 70–99)
Potassium: 4.2 mmol/L (ref 3.5–5.1)
Sodium: 137 mmol/L (ref 135–145)

## 2022-06-24 LAB — TROPONIN I (HIGH SENSITIVITY)
Troponin I (High Sensitivity): 5 ng/L (ref <18)
Troponin I (High Sensitivity): 4 ng/L (ref <18)

## 2022-06-24 MED ORDER — AMLODIPINE BESYLATE 5 MG PO TABS: 5.0000 mg | ORAL_TABLET | Freq: Every day | ORAL | 3 refills | Status: DC

## 2022-06-24 NOTE — Progress Notes (Signed)
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 cologne, perfume, aftershave, or lotions (deodorant is allowed). Please arrive 15 minutes prior to your appointment time.  BP Nurse Visit in 1-2 weeks.  Please bring your Blood pressure cuff.  Your physician has requested that you have a sleep study.   Follow-Up: At [facility], you and your health needs are our priority.  As part of our continuing mission to provide you with exceptional heart care, we have created designated Provider Care Teams.  These Care Teams include your primary Cardiologist (physician) and Advanced Practice Providers (APPs -  Physician  Assistants and Nurse Practitioners) who all work together to provide you with the care you need, when you need it.     Your next appointment:   6 month(s)  Provider:   Eligha Bridegroom, NP         Signed, [name], MD  06/24/2022 10:46 AM    [facility]

## 2022-06-24 NOTE — ED Triage Notes (Signed)
Pt arrived via [facility] from [facility] cardiology for hypotension (80/50). Pt denies any sx, except int fatigue w/activityx3wks. Pt is A&Ox4.

## 2022-06-24 NOTE — Patient Instructions (Signed)
Medication Instructions:  Your physician has recommended you make the following change in your medication:  DECREASE: amlodipine (Norvasc) to 5 mg by mouth once daily; Please monitor your Blood pressure  *If you need a refill on your cardiac medications before your next appointment, please call your pharmacy*   Lab Work: NONE  If you have labs (blood work) drawn today and your tests are completely normal, you will receive your results only by: MyChart Message (if you have MyChart) OR A paper copy in the mail If you have any lab test that is abnormal or we need to change your treatment, we will call you to review the results.   Testing/Procedures: Your physician has requested that you have an echocardiogram. Echocardiography is a painless test that uses sound waves to create images of your heart. It provides your doctor with information about the size and shape of your heart and how well your heart's chambers and valves are working. This procedure takes approximately one hour. There are no restrictions for this procedure. Please do NOT wear cologne, perfume, aftershave, or lotions (deodorant is allowed). Please arrive 15 minutes prior to your appointment time.  BP Nurse Visit in 1-2 weeks.  Please bring your Blood pressure cuff.  Your physician has requested that you have a sleep study.   Follow-Up: At [facility], you and your health needs are our priority.  As part of our continuing mission to provide you with exceptional heart care, we have created designated Provider Care Teams.  These Care Teams include your primary Cardiologist (physician) and Advanced Practice Providers (APPs -  Physician Assistants and Nurse Practitioners) who all work together to provide you with the care you need, when you need it.     Your next appointment:   6 month(s)  Provider:   Eligha Bridegroom, NP

## 2022-06-24 NOTE — ED Provider Notes (Signed)
[place] EMERGENCY DEPARTMENT AT [facility] Provider Note   CSN: [id number] Arrival date & time: 06/24/22  1119     History  Chief Complaint  Patient presents with   Hypotension    David Hartman is a 62 y.o. male.  62 year old male presents from doctor's office due to hypotension.  Patient states he has been in his baseline state of health.  At his doctor's office patient was noted to have a blood pressure of 80.  They did not get IV access according to the outside records.  Patient states has had no recent history of volume loss.  He is on multiple medications for his high blood pressure although they have not been changed recently.  His cardiologist did make some adjustments today.  Patient does endorse some dizziness when he stands up.  Denies any actual syncope.       Home Medications Prior to Admission medications   Medication Sig Start Date End Date Taking? Authorizing Provider  allopurinol (ZYLOPRIM) 300 MG tablet Take 1 tablet (300 mg total) by mouth daily. 04/30/22   Park Meo, FNP  ALPRAZolam Prudy Feeler) 1 MG tablet Take 0.5 tablets (0.5 mg total) by mouth at bedtime as needed for anxiety. 04/30/22   Park Meo, FNP  amLODipine (NORVASC) 5 MG tablet Take 1 tablet (5 mg total) by mouth daily. 06/24/22 09/22/22  [name], MD  aspirin EC (ASPIRIN LOW DOSE) 81 MG tablet TAKE 1 TABLET(81 MG) BY MOUTH DAILY. SWALLOW WHOLE 02/27/22   Chandrasekhar, Mahesh A, MD  atorvastatin (LIPITOR) 80 MG tablet TAKE 1 TABLET(80 MG) BY MOUTH DAILY 02/27/22   Chandrasekhar, Mahesh A, MD  Cholecalciferol (VITAMIN D3 PO) Take 1 tablet by mouth daily.    [provider]  diphenhydrAMINE (BENADRYL) 25 MG tablet Take 25 mg by mouth every 6 (six) hours as needed.    [provider]  DULoxetine (CYMBALTA) 30 MG capsule Take 1 capsule (30 mg total) by mouth daily. 04/30/22   Park Meo, FNP  ferrous sulfate 325 (65 FE) MG tablet Take 325 mg by mouth  daily.    [provider]  lisinopril (ZESTRIL) 40 MG tablet Take 1 tablet (40 mg total) by mouth daily. 04/30/22   Park Meo, FNP  Magnesium 200 MG TABS Take 200 mg by mouth at bedtime.    [provider]  metFORMIN (GLUCOPHAGE-XR) 500 MG 24 hr tablet Take 2 tablets (1,000 mg total) by mouth 2 (two) times daily with a meal. 04/02/22   Park Meo, FNP  Multiple Vitamin (MULTIVITAMIN WITH MINERALS) TABS tablet Take 1 tablet by mouth daily. Men over 50    [provider]  naloxone [facility]) nasal spray 4 mg/0.1 mL Place into the nose as needed. Patient not taking: Reported on 06/24/2022 06/12/22   [provider]  omeprazole (PRILOSEC OTC) 20 MG tablet Take 20 mg by mouth daily.    [provider]  propranolol (INDERAL) 40 MG tablet Take 1 tablet (40 mg total) by mouth daily. 04/30/22   Park Meo, FNP  tiZANidine (ZANAFLEX) 4 MG tablet Take 4 mg by mouth 3 (three) times daily. 11/14/19   [provider]  traMADol (ULTRAM-ER) 300 MG 24 hr tablet Take 300 mg by mouth daily.    [provider]  zolpidem (AMBIEN) 10 MG tablet Take 1 tablet (10 mg total) by mouth at bedtime. 04/30/22   Park Meo, FNP      Allergies  Pregabalin, Simvastatin, and Trazodone and nefazodone    Review of Systems   Review of Systems  All other systems reviewed and are negative.   Physical Exam Updated Vital Signs BP 106/71   Pulse (!) 59   Temp 98.1 F (36.7 C) (Oral)   Resp 16   Ht 1.778 m (5\' 10" )   Wt 113.8 kg   SpO2 98%   BMI 36.00 kg/m  Physical Exam Vitals and nursing note reviewed.  Constitutional:      General: He is not in acute distress.    Appearance: Normal appearance. He is well-developed. He is not toxic-appearing.  HENT:     Head: Normocephalic and atraumatic.  Eyes:     General: Lids are normal.     Conjunctiva/sclera: Conjunctivae normal.     Pupils: Pupils are equal, round, and reactive to light.  Neck:      Thyroid: No thyroid mass.     Trachea: No tracheal deviation.  Cardiovascular:     Rate and Rhythm: Normal rate and regular rhythm.     Heart sounds: Normal heart sounds. No murmur heard.    No gallop.  Pulmonary:     Effort: Pulmonary effort is normal. No respiratory distress.     Breath sounds: Normal breath sounds. No stridor. No decreased breath sounds, wheezing, rhonchi or rales.  Abdominal:     General: There is no distension.     Palpations: Abdomen is soft.     Tenderness: There is no abdominal tenderness. There is no rebound.  Musculoskeletal:        General: No tenderness. Normal range of motion.     Cervical back: Normal range of motion and neck supple.  Skin:    General: Skin is warm and dry.     Findings: No abrasion or rash.  Neurological:     Mental Status: He is alert and oriented to person, place, and time. Mental status is at baseline.     GCS: GCS eye subscore is 4. GCS verbal subscore is 5. GCS motor subscore is 6.     Cranial Nerves: No cranial nerve deficit.     Sensory: No sensory deficit.     Motor: Motor function is intact.  Psychiatric:        Attention and Perception: Attention normal.        Speech: Speech normal.        Behavior: Behavior normal.     ED Results / Procedures / Treatments   Labs (all labs ordered are listed, but only abnormal results are displayed) Labs Reviewed  BASIC METABOLIC PANEL - Abnormal; Notable for the following components:      Result Value   CO2 21 (*)    Glucose, Bld 114 (*)    BUN 25 (*)    Calcium 8.8 (*)    All other components within normal limits  CBC - Abnormal; Notable for the following components:   RBC 3.90 (*)    Hemoglobin 11.8 (*)    HCT 37.8 (*)    All other components within normal limits  TROPONIN I (HIGH SENSITIVITY)  TROPONIN I (HIGH SENSITIVITY)    EKG EKG Interpretation  Date/Time:  Tuesday June 24 2022 11:23:48 EDT Ventricular Rate:  59 PR Interval:  220 QRS Duration: 74 QT  Interval:  402 QTC Calculation: 397 R Axis:   69 Text Interpretation: Sinus bradycardia with 1st degree A-V block Confirmed by Cathren Laine ([id number]) on 06/24/2022 11:50:59 AM  Radiology DG Chest 2  View  Result Date: 06/24/2022 CLINICAL DATA:  Hypotension EXAM: CHEST - 2 VIEW COMPARISON:  X-ray 02/19/2022 and older FINDINGS: No consolidation, pneumothorax or effusion. No edema. Normal cardiopericardial silhouette. Degenerative changes are seen along the spine. IMPRESSION: No acute cardiopulmonary disease Electronically Signed   By: Karen Kays M.D.   On: 06/24/2022 12:41    Procedures Procedures    Medications Ordered in ED Medications - No data to display  ED Course/ Medical Decision Making/ A&P                             Medical Decision Making Amount and/or Complexity of Data Reviewed Labs: ordered. Radiology: ordered.  Chest x-ray per interpretation shows no acute findings. Patient is EKG shows sinus bradycardia without evidence of ischemic changes.  Blood pressure has been stable here.  Suspect patient's blood pressure medications is etiology of his transient hypotension.  His medications have been adjusted by his physician.  Patient stable for discharge        Final Clinical Impression(s) / ED Diagnoses Final diagnoses:  None    Rx / DC Orders ED Discharge Orders     None         Lorre Nick, MD 06/24/22 1904

## 2022-06-24 NOTE — Telephone Encounter (Signed)
[name] sleep study was ordered by Dr. Izora Ribas in clinic today. Patient was provided with the device and instructions. He is aware not to open and complete the test until he receives a call from our office with the pin number. Device has been registered.

## 2022-06-25 ENCOUNTER — Encounter: Payer: Self-pay | Admitting: Family Medicine

## 2022-06-25 ENCOUNTER — Ambulatory Visit (INDEPENDENT_AMBULATORY_CARE_PROVIDER_SITE_OTHER): Payer: Medicare Other | Admitting: Family Medicine

## 2022-06-25 VITALS — BP 126/82 | HR 59 | Temp 97.5°F | Ht 70.0 in | Wt 248.0 lb

## 2022-06-25 DIAGNOSIS — I152 Hypertension secondary to endocrine disorders: Secondary | ICD-10-CM

## 2022-06-25 DIAGNOSIS — E1159 Type 2 diabetes mellitus with other circulatory complications: Secondary | ICD-10-CM | POA: Diagnosis not present

## 2022-06-25 DIAGNOSIS — R400 Somnolence: Secondary | ICD-10-CM

## 2022-06-25 IMAGING — CT CT ABD-PELV W/O CM
2 of 4 series · 16 of 46 positions shown, 18 images · non-contrast
Comparison: 11/27/2019

CLINICAL DATA: Left flank pain worse today. History of kidney
stones. History of prostate cancer.

EXAM:
CT ABDOMEN AND PELVIS WITHOUT CONTRAST
TECHNIQUE: Multidetector CT imaging of the abdomen and pelvis was performed
following the standard protocol without IV contrast.

[Series 2: axial st · axial · 0.83mm/px · z∈[-577,-117]mm · 13 of 104 slices shown, 15 images]
[im 6/104  soft-tissue]
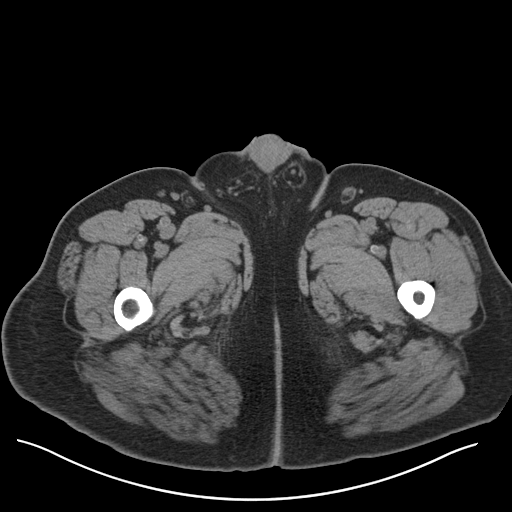
[im 6/104  bone]
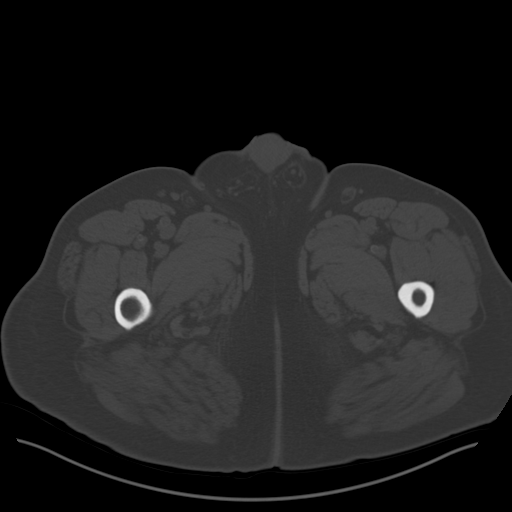
[im 12/104  soft-tissue]
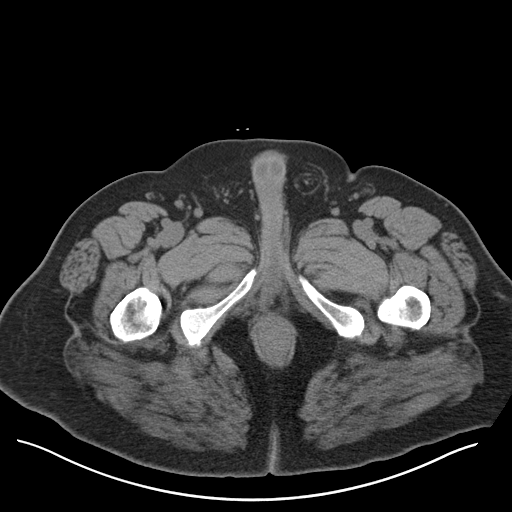
[im 23/104  soft-tissue]
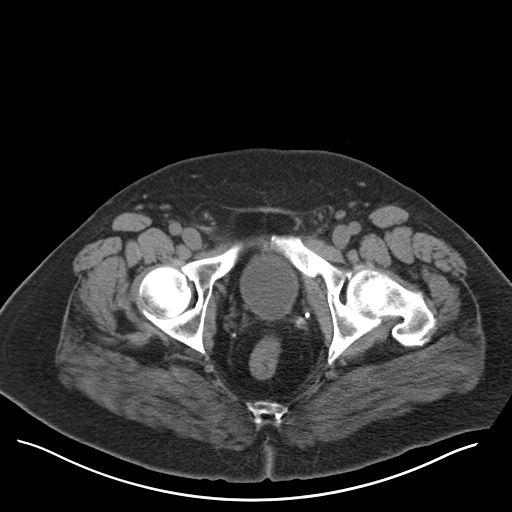
[im 29/104  soft-tissue]
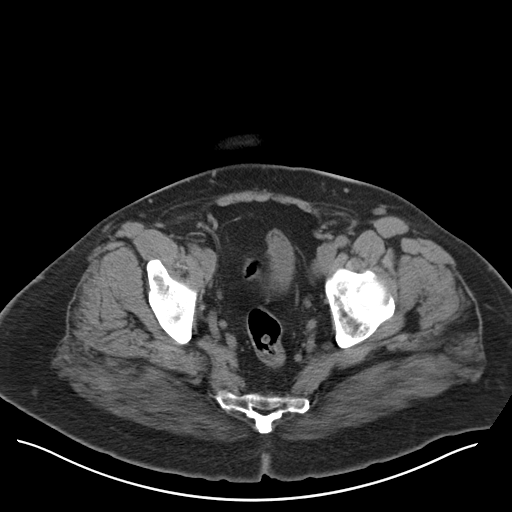
[im 35/104  soft-tissue]
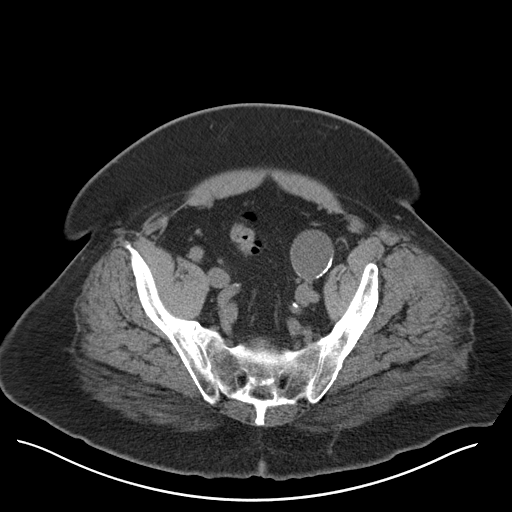
[im 46/104  soft-tissue]
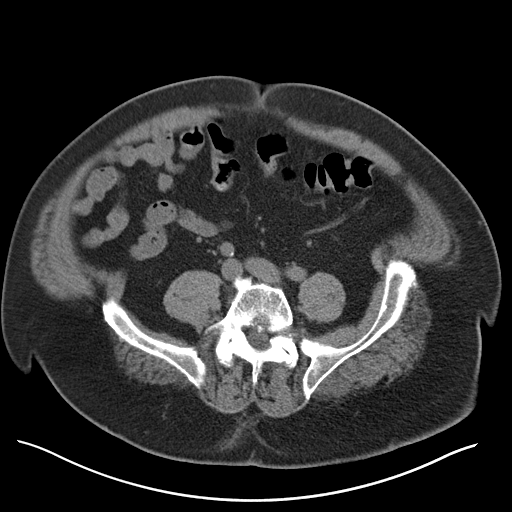
[im 52/104  soft-tissue]
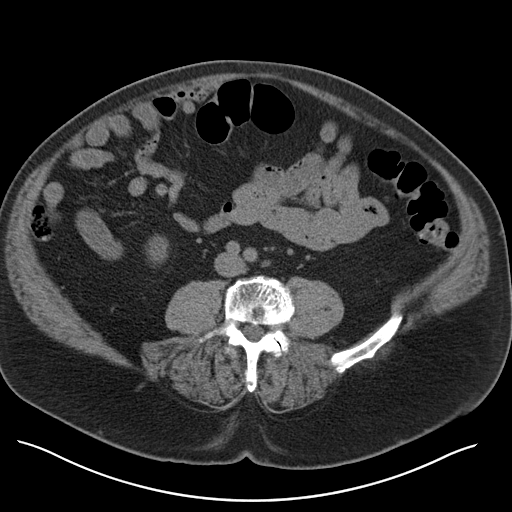
[im 58/104  soft-tissue]
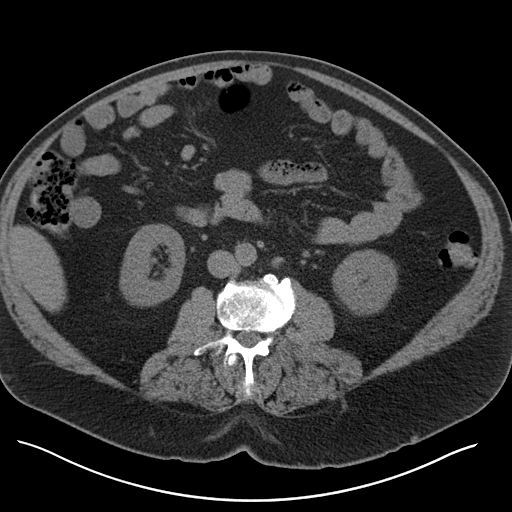
[im 69/104  soft-tissue]
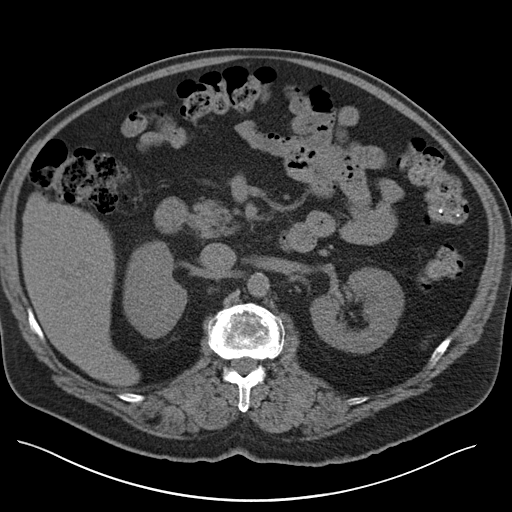
[im 69/104  bone]
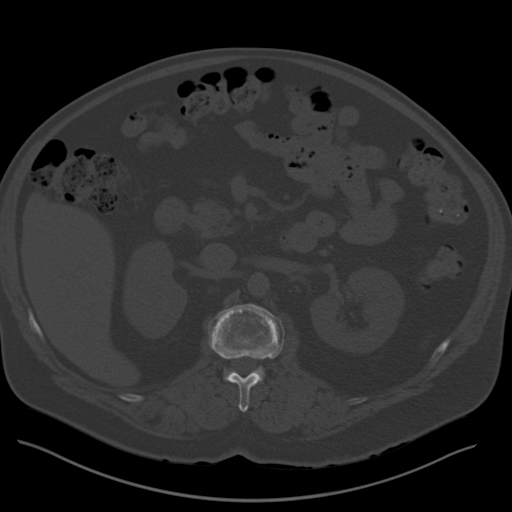
[im 75/104  soft-tissue]
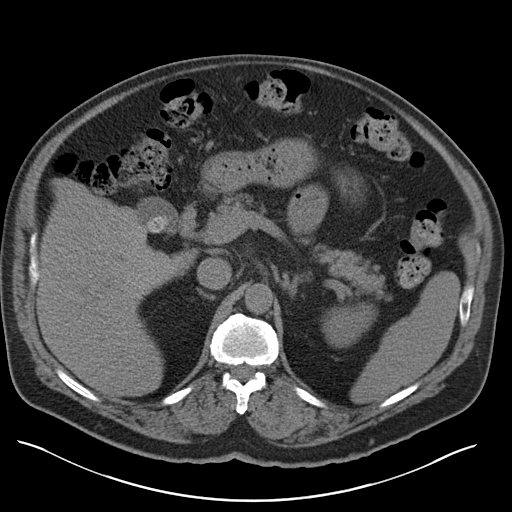
[im 81/104  soft-tissue]
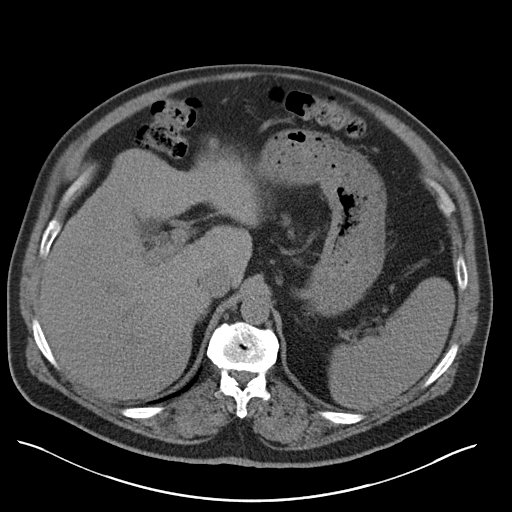
[im 92/104  soft-tissue]
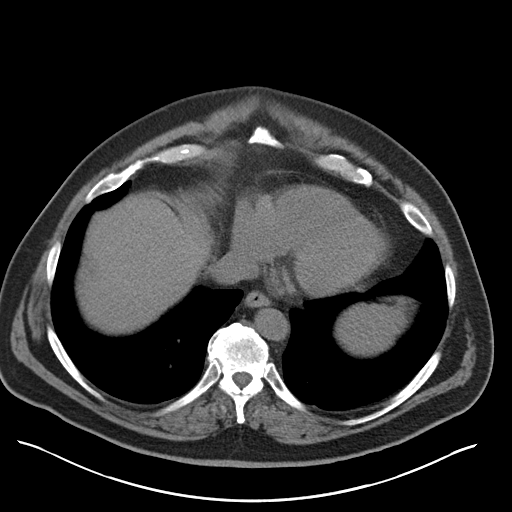
[im 98/104  soft-tissue]
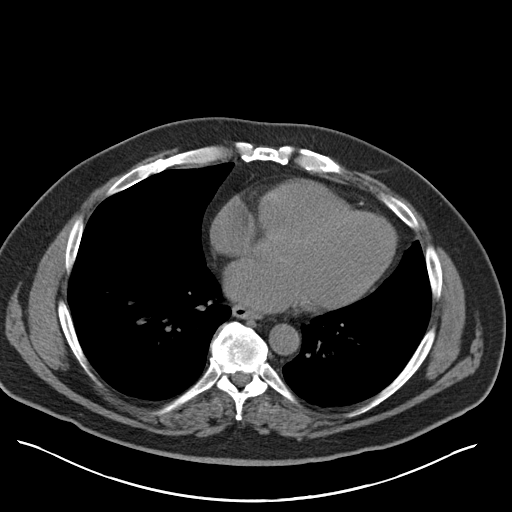

[Series 5: coronal st · coronal · 0.92mm/px · 3 of 173 slices shown]
[im 58/173  soft-tissue]
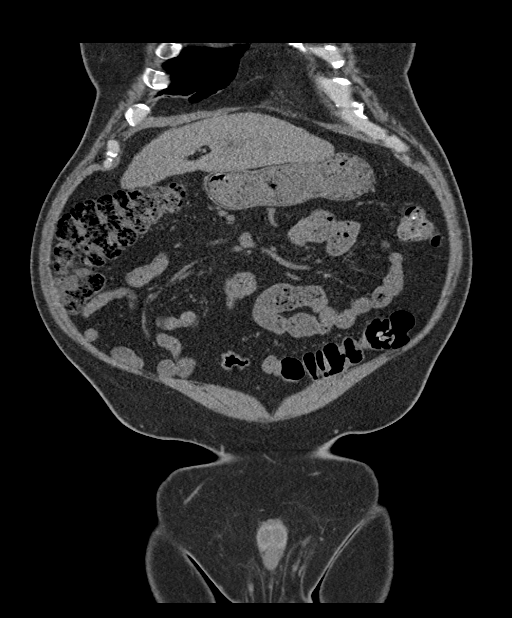
[im 77/173  soft-tissue]
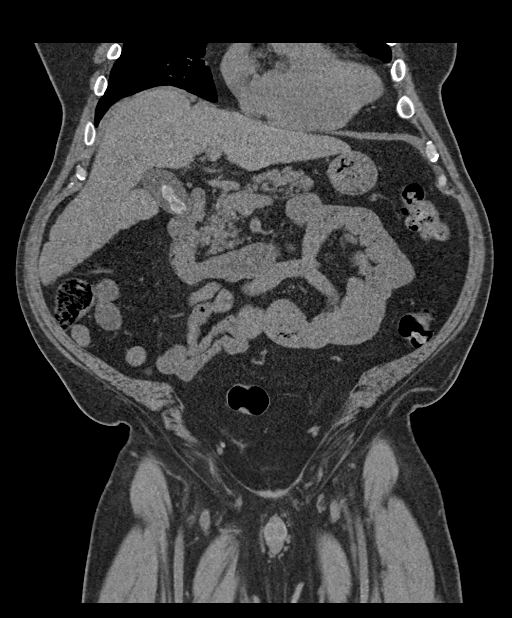
[im 96/173  soft-tissue]
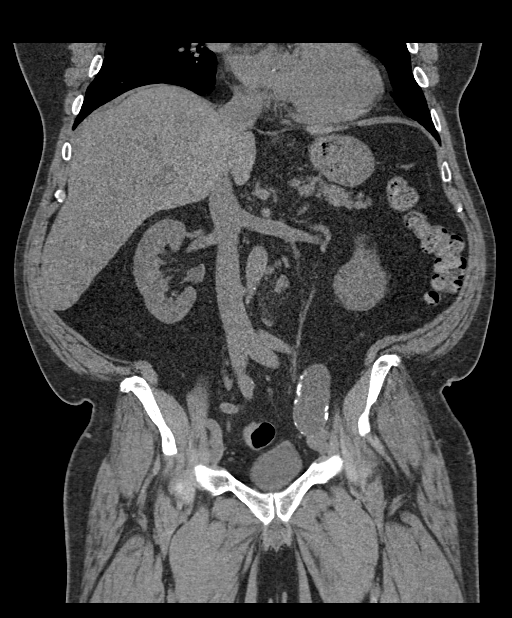

[16 of 46 positions shown; findings below may reference images not displayed]

FINDINGS: Lower chest: Lung bases are clear.  Coronary artery calcifications.

Hepatobiliary: Cholelithiasis. No evidence of cholecystitis. No
focal liver lesions. No bile duct dilatation.

Pancreas: Unremarkable. No pancreatic ductal dilatation or
surrounding inflammatory changes.

Spleen: Normal in size without focal abnormality.

Adrenals/Urinary Tract: Adrenal glands are unremarkable. Kidneys are
normal, without renal calculi, focal lesion, or hydronephrosis.
Bladder is unremarkable.

Stomach/Bowel: Stomach, small bowel, and colon are not abnormally
distended. No wall thickening or inflammatory changes. Appendix is
surgically absent.

Vascular/Lymphatic: Scattered calcification in the abdominal aorta.
No aortic aneurysm.

Reproductive: Prostate gland is surgically absent.

Other: No free air or free fluid in the abdomen. Abdominal wall
musculature appears intact. Rim calcified cystic structure in the
left pelvis measuring 4 cm diameter. No change since prior study.

Musculoskeletal: Degenerative changes in the lumbar spine. Anterior
compression of L2 vertebra. No change since previous study. No
developing focal bone lesions.
IMPRESSION: 1. No renal or ureteral stone or obstruction.
2. No evidence of bowel obstruction or inflammation.
3. Cholelithiasis.
4. Stable appearance of a calcified cystic structure in the left
pelvis.

## 2022-06-25 NOTE — Assessment & Plan Note (Signed)
Referral placed to Neurology for sleep studies and evaluation for sleep apnea.

## 2022-06-25 NOTE — Telephone Encounter (Signed)
I s/w the pt and told him David Hartman. Pt tells me that he d/w Dr. Izora Ribas yesterday and that he is going to let his PCP handle sleep study. Pt will return the device back to the office tomorrow. Once I have the device I will un-register the device and place back into inventory.   Pt said he also need to schedule an echo. I assured the pt that I will send a message to Dr. Izora Ribas nurse to schedule echo. Pt thanked me for my call.
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
## 2022-06-25 NOTE — Assessment & Plan Note (Signed)
Symptomatic hypotension yesterday and possibly at home for several months during the day. Reduced Amlodipine to 5mg  per cardiology. Instructed to monitor his BP TID to get a mid-day reading as well as when symptomatic with dizziness or vision changes and report to office if readings are less than 120/80. His home cuff does read approx 14 points higher than manual cuff readings. He has a scheduled ECHO with cardiology and follow up appointment soon. Will return to me in 1 month if unable to see cardiology. Seek medical care immediately for BP < 100/60, chest pain, palpitations, vision changes, recurrent headaches.

## 2022-06-25 NOTE — Telephone Encounter (Signed)
Prior Authorization for ITAMAR sent to [employer] via web portal. Tracking Number . READY- NO PA REQ 

## 2022-06-26 ENCOUNTER — Telehealth (HOSPITAL_COMMUNITY): Payer: Self-pay | Admitting: Internal Medicine

## 2022-06-26 NOTE — Telephone Encounter (Signed)
I called patient to schedule echocardiogram and patient declined to schedule. He states that he will be bringing the monitor back also and will follow up with his PCP. Order will be removed from the echo WQ. Thank you

## 2022-06-26 NOTE — Telephone Encounter (Signed)
Patient called back and states that he was incorrect about following his PCP and wants to schedule echocardiogram. Order was reinstated and patient is scheduled. Thank you

## 2022-06-26 NOTE — Telephone Encounter (Signed)
Pt dropped the Itamar device off to the office today. I will un-register the device and place back into inventory.

## 2022-06-27 DIAGNOSIS — E119 Type 2 diabetes mellitus without complications: Secondary | ICD-10-CM | POA: Diagnosis not present

## 2022-06-27 LAB — HM DIABETES EYE EXAM

## 2022-06-30 ENCOUNTER — Observation Stay (HOSPITAL_COMMUNITY)
Admission: EM | Admit: 2022-06-30 | Discharge: 2022-07-01 | Disposition: A | Discharge status: Home / Self Care | Payer: Medicare Other | Attending: Internal Medicine | Admitting: Internal Medicine

## 2022-06-30 ENCOUNTER — Other Ambulatory Visit: Payer: Self-pay

## 2022-06-30 ENCOUNTER — Emergency Department (HOSPITAL_COMMUNITY): Payer: Medicare Other

## 2022-06-30 DIAGNOSIS — R079 Chest pain, unspecified: Secondary | ICD-10-CM | POA: Diagnosis not present

## 2022-06-30 DIAGNOSIS — Z7984 Long term (current) use of oral hypoglycemic drugs: Secondary | ICD-10-CM | POA: Insufficient documentation

## 2022-06-30 DIAGNOSIS — R404 Transient alteration of awareness: Secondary | ICD-10-CM | POA: Diagnosis not present

## 2022-06-30 DIAGNOSIS — I10 Essential (primary) hypertension: Secondary | ICD-10-CM | POA: Insufficient documentation

## 2022-06-30 DIAGNOSIS — I700 Atherosclerosis of aorta: Secondary | ICD-10-CM | POA: Diagnosis not present

## 2022-06-30 DIAGNOSIS — E119 Type 2 diabetes mellitus without complications: Secondary | ICD-10-CM | POA: Diagnosis not present

## 2022-06-30 DIAGNOSIS — Z79899 Other long term (current) drug therapy: Secondary | ICD-10-CM | POA: Diagnosis not present

## 2022-06-30 DIAGNOSIS — R0789 Other chest pain: Principal | ICD-10-CM | POA: Insufficient documentation

## 2022-06-30 DIAGNOSIS — R6889 Other general symptoms and signs: Secondary | ICD-10-CM | POA: Diagnosis not present

## 2022-06-30 DIAGNOSIS — Z8546 Personal history of malignant neoplasm of prostate: Secondary | ICD-10-CM | POA: Insufficient documentation

## 2022-06-30 DIAGNOSIS — Z7982 Long term (current) use of aspirin: Secondary | ICD-10-CM | POA: Diagnosis not present

## 2022-06-30 DIAGNOSIS — Z85828 Personal history of other malignant neoplasm of skin: Secondary | ICD-10-CM | POA: Diagnosis not present

## 2022-06-30 DIAGNOSIS — I499 Cardiac arrhythmia, unspecified: Secondary | ICD-10-CM | POA: Diagnosis not present

## 2022-06-30 DIAGNOSIS — I2510 Atherosclerotic heart disease of native coronary artery without angina pectoris: Secondary | ICD-10-CM | POA: Diagnosis not present

## 2022-06-30 LAB — BASIC METABOLIC PANEL
Anion gap: 10 (ref 5–15)
BUN: 13 mg/dL (ref 8–23)
CO2: 22 mmol/L (ref 22–32)
Calcium: 9 mg/dL (ref 8.9–10.3)
Chloride: 107 mmol/L (ref 98–111)
Creatinine, Ser: 1.02 mg/dL (ref 0.61–1.24)
GFR, Estimated: >60 mL/min (ref >60)
Glucose, Bld: 90 mg/dL (ref 70–99)
Potassium: 4.3 mmol/L (ref 3.5–5.1)
Sodium: 139 mmol/L (ref 135–145)

## 2022-06-30 LAB — CBC
HCT: 36 % — ABNORMAL LOW (ref 39.0–52.0)
Hemoglobin: 11.8 g/dL — ABNORMAL LOW (ref 13.0–17.0)
MCH: 30.7 pg (ref 26.0–34.0)
MCHC: 32.8 g/dL (ref 30.0–36.0)
MCV: 93.8 fL (ref 80.0–100.0)
Platelets: 226 10*3/uL (ref 150–400)
RBC: 3.84 MIL/uL — ABNORMAL LOW (ref 4.22–5.81)
RDW: 12.9 % (ref 11.5–15.5)
WBC: 10.3 10*3/uL (ref 4.0–10.5)
nRBC: 0 % (ref 0.0–0.2)

## 2022-06-30 LAB — I-STAT CHEM 8, ED
BUN: 17 mg/dL (ref 8–23)
Calcium, Ion: 1.03 mmol/L — ABNORMAL LOW (ref 1.15–1.40)
Chloride: 109 mmol/L (ref 98–111)
Creatinine, Ser: 1 mg/dL (ref 0.61–1.24)
Glucose, Bld: 82 mg/dL (ref 70–99)
HCT: 35 % — ABNORMAL LOW (ref 39.0–52.0)
Hemoglobin: 11.9 g/dL — ABNORMAL LOW (ref 13.0–17.0)
Potassium: 4.5 mmol/L (ref 3.5–5.1)
Sodium: 140 mmol/L (ref 135–145)
TCO2: 24 mmol/L (ref 22–32)

## 2022-06-30 LAB — TROPONIN I (HIGH SENSITIVITY)
Troponin I (High Sensitivity): 7 ng/L (ref <18)
Troponin I (High Sensitivity): 6 ng/L (ref <18)

## 2022-06-30 LAB — I-STAT CREATININE, ED: Creatinine, Ser: 1 mg/dL (ref 0.61–1.24)

## 2022-06-30 MED ORDER — ASPIRIN 81 MG PO TBEC
81.0000 mg | DELAYED_RELEASE_TABLET | Freq: Every day | ORAL | Status: DC
  Administered 2022-07-01: 81 mg via ORAL
  Filled 2022-06-30: qty 1

## 2022-06-30 MED ORDER — IOHEXOL 350 MG/ML SOLN
100.0000 mL | Freq: Once | INTRAVENOUS | Status: AC | PRN
  Administered 2022-06-30: 100 mL via INTRAVENOUS

## 2022-06-30 MED ORDER — MORPHINE SULFATE (PF) 4 MG/ML IV SOLN
4.0000 mg | Freq: Once | INTRAVENOUS | Status: AC
  Administered 2022-06-30: 4 mg via INTRAVENOUS
  Filled 2022-06-30: qty 1

## 2022-06-30 MED ORDER — PANTOPRAZOLE SODIUM 40 MG PO TBEC
40.0000 mg | DELAYED_RELEASE_TABLET | Freq: Every day | ORAL | Status: DC
  Administered 2022-07-01: 40 mg via ORAL
  Filled 2022-06-30: qty 1

## 2022-06-30 MED ORDER — SODIUM CHLORIDE 0.9 % IV BOLUS
500.0000 mL | Freq: Once | INTRAVENOUS | Status: AC
  Administered 2022-06-30: 500 mL via INTRAVENOUS

## 2022-06-30 MED ORDER — ATORVASTATIN CALCIUM 80 MG PO TABS
80.0000 mg | ORAL_TABLET | Freq: Every day | ORAL | Status: DC
  Administered 2022-07-01: 80 mg via ORAL
  Filled 2022-06-30: qty 2

## 2022-06-30 MED ORDER — ENOXAPARIN SODIUM 40 MG/0.4ML IJ SOSY
40.0000 mg | PREFILLED_SYRINGE | INTRAMUSCULAR | Status: DC
  Administered 2022-07-01: 40 mg via SUBCUTANEOUS
  Filled 2022-06-30: qty 0.4

## 2022-06-30 MED ORDER — NITROGLYCERIN 2 % TD OINT
0.5000 [in_us] | TOPICAL_OINTMENT | Freq: Once | TRANSDERMAL | Status: AC
  Administered 2022-06-30: 0.5 [in_us] via TOPICAL
  Filled 2022-06-30: qty 1

## 2022-06-30 MED ORDER — ONDANSETRON HCL 4 MG/2ML IJ SOLN
4.0000 mg | Freq: Once | INTRAMUSCULAR | Status: AC
  Administered 2022-06-30: 4 mg via INTRAVENOUS
  Filled 2022-06-30: qty 2

## 2022-06-30 NOTE — ED Triage Notes (Signed)
PT BIB [facility] with a c/o of CP after working under his trailer at home. Intial pain was a 8/10 then after rest became a 10/10 and pain radiated to his back neck, left upper arm.Pt also c/o of sob and dizziness. 3 doses of nitro were given (1.2mg ) and 324 ASA. 12 lead was normal.
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
## 2022-06-30 NOTE — ED Provider Notes (Addendum)
[place] EMERGENCY DEPARTMENT AT [facility] Provider Note   CSN: [id number] Arrival date & time: 06/30/22  1914     History  Chief Complaint  Patient presents with   Chest Pain    David Hartman is a 62 y.o. male.  HPI   62 year old male presents emergency department with sudden onset chest, back and neck pain.  Patient states that he was working under a car about 4 hours ago.  When he went to pull himself out and stand up he had sudden onset right-sided chest pain that radiated to the left and then went straight through to his back and up the left backside of his neck.  He felt short of breath and like he was going to pass out.  EMS was called.  He was given nitro with mild improvement but states that the pain is still 8/10 in the same pattern, going through to his back.  No other recent fever or change in health.  No swelling of his lower extremities.  Home Medications Prior to Admission medications   Medication Sig Start Date End Date Taking? Authorizing Provider  allopurinol (ZYLOPRIM) 300 MG tablet Take 1 tablet (300 mg total) by mouth daily. 04/30/22   Park Meo, FNP  ALPRAZolam Prudy Feeler) 1 MG tablet Take 0.5 tablets (0.5 mg total) by mouth at bedtime as needed for anxiety. 04/30/22   Park Meo, FNP  amLODipine (NORVASC) 5 MG tablet Take 1 tablet (5 mg total) by mouth daily. 06/24/22 09/22/22  [name], MD  aspirin EC (ASPIRIN LOW DOSE) 81 MG tablet TAKE 1 TABLET(81 MG) BY MOUTH DAILY. SWALLOW WHOLE 02/27/22   Chandrasekhar, Mahesh A, MD  atorvastatin (LIPITOR) 80 MG tablet TAKE 1 TABLET(80 MG) BY MOUTH DAILY 02/27/22   Chandrasekhar, Mahesh A, MD  Cholecalciferol (VITAMIN D3 PO) Take 1 tablet by mouth daily.    [provider]  diphenhydrAMINE (BENADRYL) 25 MG tablet Take 25 mg by mouth every 6 (six) hours as needed.    [provider]  DULoxetine (CYMBALTA) 30 MG capsule Take 1 capsule (30 mg total) by mouth daily. 04/30/22    Park Meo, FNP  ferrous sulfate 325 (65 FE) MG tablet Take 325 mg by mouth daily.    [provider]  lisinopril (ZESTRIL) 40 MG tablet Take 1 tablet (40 mg total) by mouth daily. 04/30/22   Park Meo, FNP  Magnesium 200 MG TABS Take 200 mg by mouth at bedtime.    [provider]  metFORMIN (GLUCOPHAGE-XR) 500 MG 24 hr tablet Take 2 tablets (1,000 mg total) by mouth 2 (two) times daily with a meal. 04/02/22   Park Meo, FNP  Multiple Vitamin (MULTIVITAMIN WITH MINERALS) TABS tablet Take 1 tablet by mouth daily. Men over 50    [provider]  naloxone [facility]) nasal spray 4 mg/0.1 mL Place into the nose as needed. 06/12/22   [provider]  omeprazole (PRILOSEC OTC) 20 MG tablet Take 20 mg by mouth daily.    [provider]  propranolol (INDERAL) 40 MG tablet Take 1 tablet (40 mg total) by mouth daily. 04/30/22   Park Meo, FNP  tiZANidine (ZANAFLEX) 4 MG tablet Take 4 mg by mouth 3 (three) times daily. 11/14/19   [provider]  traMADol (ULTRAM-ER) 300 MG 24 hr tablet Take 300 mg by mouth daily.    [provider]  zolpidem (AMBIEN) 10 MG tablet Take 1 tablet (10 mg  total) by mouth at bedtime. 04/30/22   Park Meo, FNP      Allergies    Pregabalin, Simvastatin, and Trazodone and nefazodone    Review of Systems   Review of Systems  Constitutional:  Positive for fatigue. Negative for fever.  Respiratory:  Positive for chest tightness. Negative for shortness of breath.   Cardiovascular:  Positive for chest pain. Negative for palpitations and leg swelling.  Gastrointestinal:  Negative for abdominal pain, diarrhea and vomiting.  Musculoskeletal:  Positive for back pain and neck pain.  Skin:  Negative for rash.  Neurological:  Negative for headaches.    Physical Exam Updated Vital Signs BP 125/72   Pulse 60   Temp 98 F (36.7 C) (Oral)   Resp 10   Ht 5\' 10"  (1.778 m)   Wt 112.5 kg   SpO2 97%    BMI 35.59 kg/m  Physical Exam Vitals and nursing note reviewed.  Constitutional:      Appearance: Normal appearance.     Comments: At times arching his back and uncomfortable appearing  HENT:     Head: Normocephalic.     Mouth/Throat:     Mouth: Mucous membranes are moist.  Cardiovascular:     Rate and Rhythm: Normal rate.  Pulmonary:     Effort: Pulmonary effort is normal. No respiratory distress.  Chest:     Comments: No reproducible tenderness Abdominal:     Palpations: Abdomen is soft.     Tenderness: There is no abdominal tenderness.  Musculoskeletal:     Comments: Equal palpable radial pulses  Skin:    General: Skin is warm.  Neurological:     Mental Status: He is alert and oriented to person, place, and time. Mental status is at baseline.  Psychiatric:        Mood and Affect: Mood normal.     ED Results / Procedures / Treatments   Labs (all labs ordered are listed, but only abnormal results are displayed) Labs Reviewed  CBC - Abnormal; Notable for the following components:      Result Value   RBC 3.84 (*)    Hemoglobin 11.8 (*)    HCT 36.0 (*)    All other components within normal limits  I-STAT CHEM 8, ED - Abnormal; Notable for the following components:   Calcium, Ion 1.03 (*)    Hemoglobin 11.9 (*)    HCT 35.0 (*)    All other components within normal limits  BASIC METABOLIC PANEL  I-STAT CREATININE, ED  TROPONIN I (HIGH SENSITIVITY)    EKG EKG Interpretation  Date/Time:  Monday June 30 2022 19:20:30 EDT Ventricular Rate:  61 PR Interval:  217 QRS Duration: 84 QT Interval:  407 QTC Calculation: 410 R Axis:   58 Text Interpretation: Sinus rhythm Borderline prolonged PR interval Confirmed by Coralee Pesa [phone]) on 06/30/2022 7:48:24 PM  Radiology DG Chest 2 View  Result Date: 06/30/2022 CLINICAL DATA:  Chest pain. EXAM: CHEST - 2 VIEW COMPARISON:  June 24, 2022 FINDINGS: The heart size and mediastinal contours are within normal limits. Both  lungs are clear. The visualized skeletal structures are unremarkable. IMPRESSION: No active cardiopulmonary disease. Electronically Signed   By: Aram Candela M.D.   On: 06/30/2022 20:20    Procedures Procedures    Medications Ordered in ED Medications  morphine (PF) 4 MG/ML injection 4 mg (4 mg Intravenous Given 06/30/22 2029)  iohexol (OMNIPAQUE) 350 MG/ML injection 100 mL (100 mLs Intravenous Contrast Given 06/30/22 2027)  ED Course/ Medical Decision Making/ A&P                             Medical Decision Making Amount and/or Complexity of Data Reviewed Labs: ordered. Radiology: ordered.  Risk Prescription drug management. Decision regarding hospitalization.   62 year old male presents emergency department sudden onset chest/back pain while working underneath a car.  Initially rated the pain 10/10.  After nitroglycerin with EMS it is 8/10.  Chest pain is not reproducible, does not seem musculoskeletal.  Vitals are stable on arrival however patient appears very uncomfortable, at times arching his back.  Given the sudden onset of the pain given the pain pattern my concern is to rule out dissection.  EKG shows no acute ischemic changes.  Initial blood work is reassuring, first troponin is negative.  CT dissection study shows no acute finding.  On reevaluation patient continues to have 4/10 chest pain.  Mildly improved with morphine, will try Nitropaste. Repeat troponin normal with no delta.  Review of his chart shows that he has seen cardiologist, Dr. Lorna Few.  He has had a CT coronary study in the past that showed mild obstructive disease.  He was scheduled to have an echo in the next week.  Given this information with ongoing chest pain with no other definitive etiology I consulted with cardiology fellow.  They agree with admission, monitoring chest pain with cardiology evaluation tomorrow.  Patient and daughter agree with this admission plan.  Patients evaluation and  results requires admission for further treatment and care.  Spoke with hospitalist, reviewed patient's ED course and they accept admission.  Patient agrees with admission plan, offers no new complaints and is stable/unchanged at time of admit.        Final Clinical Impression(s) / ED Diagnoses Final diagnoses:  None    Rx / DC Orders ED Discharge Orders     None         Rozelle Logan, DO 06/30/22 2342    Rozelle Logan, DO 06/30/22 2342

## 2022-07-01 ENCOUNTER — Observation Stay (HOSPITAL_COMMUNITY): Payer: Medicare Other

## 2022-07-01 ENCOUNTER — Observation Stay (HOSPITAL_BASED_OUTPATIENT_CLINIC_OR_DEPARTMENT_OTHER): Payer: Medicare Other

## 2022-07-01 ENCOUNTER — Encounter (HOSPITAL_COMMUNITY): Payer: Self-pay | Admitting: Internal Medicine

## 2022-07-01 ENCOUNTER — Ambulatory Visit (HOSPITAL_COMMUNITY): Admission: RE | Admit: 2022-07-01 | Payer: Medicare Other | Source: Ambulatory Visit

## 2022-07-01 ENCOUNTER — Encounter (HOSPITAL_COMMUNITY): Payer: Self-pay

## 2022-07-01 DIAGNOSIS — R072 Precordial pain: Secondary | ICD-10-CM

## 2022-07-01 DIAGNOSIS — R079 Chest pain, unspecified: Secondary | ICD-10-CM

## 2022-07-01 LAB — CBC
HCT: 35.3 % — ABNORMAL LOW (ref 39.0–52.0)
Hemoglobin: 11.6 g/dL — ABNORMAL LOW (ref 13.0–17.0)
MCH: 30.8 pg (ref 26.0–34.0)
MCHC: 32.9 g/dL (ref 30.0–36.0)
MCV: 93.6 fL (ref 80.0–100.0)
Platelets: 184 10*3/uL (ref 150–400)
RBC: 3.77 MIL/uL — ABNORMAL LOW (ref 4.22–5.81)
RDW: 13.1 % (ref 11.5–15.5)
WBC: 8.9 10*3/uL (ref 4.0–10.5)
nRBC: 0 % (ref 0.0–0.2)

## 2022-07-01 LAB — ECHOCARDIOGRAM COMPLETE
Area-P 1/2: 4.49 cm2
Height: 70 in
S' Lateral: 3.6 cm
Weight: 3968.28 oz

## 2022-07-01 LAB — CREATININE, SERUM
Creatinine, Ser: 0.9 mg/dL (ref 0.61–1.24)
GFR, Estimated: >60 mL/min (ref >60)

## 2022-07-01 LAB — HIV ANTIBODY (ROUTINE TESTING W REFLEX): HIV Screen 4th Generation wRfx: NONREACTIVE

## 2022-07-01 MED ORDER — POLYETHYLENE GLYCOL 3350 17 G PO PACK: 17.0000 g | PACK | Freq: Every day | ORAL | Status: DC | PRN

## 2022-07-01 MED ORDER — FERROUS SULFATE 325 (65 FE) MG PO TABS
325.0000 mg | ORAL_TABLET | Freq: Every day | ORAL | Status: DC
  Administered 2022-07-01: 325 mg via ORAL
  Filled 2022-07-01: qty 1

## 2022-07-01 MED ORDER — ALPRAZOLAM 0.25 MG PO TABS: 0.5000 mg | ORAL_TABLET | Freq: Every evening | ORAL | Status: DC | PRN

## 2022-07-01 MED ORDER — PERFLUTREN LIPID MICROSPHERE
1.0000 mL | INTRAVENOUS | Status: AC | PRN
  Administered 2022-07-01: 4 mL via INTRAVENOUS

## 2022-07-01 MED ORDER — ALLOPURINOL 100 MG PO TABS
300.0000 mg | ORAL_TABLET | Freq: Every day | ORAL | Status: DC
  Administered 2022-07-01: 300 mg via ORAL
  Filled 2022-07-01: qty 3

## 2022-07-01 MED ORDER — ACETAMINOPHEN 325 MG PO TABS: 650.0000 mg | ORAL_TABLET | Freq: Four times a day (QID) | ORAL | Status: DC | PRN

## 2022-07-01 MED ORDER — NITROGLYCERIN 0.4 MG SL SUBL: 0.4000 mg | SUBLINGUAL_TABLET | SUBLINGUAL | Status: DC | PRN

## 2022-07-01 MED ORDER — DULOXETINE HCL 60 MG PO CPEP: 60.0000 mg | ORAL_CAPSULE | Freq: Every day | ORAL | Status: DC

## 2022-07-01 MED ORDER — MELATONIN 5 MG PO TABS: 5.0000 mg | ORAL_TABLET | Freq: Every evening | ORAL | Status: DC | PRN

## 2022-07-01 MED ORDER — PROCHLORPERAZINE EDISYLATE 10 MG/2ML IJ SOLN: 5.0000 mg | Freq: Four times a day (QID) | INTRAMUSCULAR | Status: DC | PRN

## 2022-07-01 MED ORDER — HYDROMORPHONE HCL 1 MG/ML IJ SOLN
0.5000 mg | INTRAMUSCULAR | Status: DC | PRN
  Administered 2022-07-01: 0.5 mg via INTRAVENOUS
  Filled 2022-07-01: qty 1

## 2022-07-01 NOTE — Progress Notes (Signed)
  Echocardiogram 2D Echocardiogram has been performed.  Delcie Roch 07/01/2022, 9:39 AM
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
## 2022-07-01 NOTE — Consult Note (Addendum)
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 of aortic aneurysm or dissection. 2. Aortic atherosclerosis. 3. Lungs are clear. 4. No acute process demonstrated in the abdomen or pelvis. Rim calcified cystic structure in the left pelvis is unchanged since at least 2017 suggesting benign etiology. 5. Cholelithiasis without evidence of acute cholecystitis. Electronically Signed   By: Burman Nieves M.D.   On: 06/30/2022 20:51   DG Chest 2 View  Result Date: 06/30/2022 CLINICAL DATA:  Chest pain. EXAM: CHEST - 2 VIEW COMPARISON:  June 24, 2022 FINDINGS: The heart size and mediastinal contours are within normal limits. Both lungs are clear. The visualized skeletal structures are unremarkable. IMPRESSION: No active cardiopulmonary disease. Electronically Signed   By: Aram Candela M.D.   On: 06/30/2022 20:20     Assessment and Plan:   Chest pain Mild nonobstructive CAD - chest pain after working under his trailer, with SOB, dizzy, nausea -High sensitive troponin negative x 2 -EKG without acute ischemic  change -Most recent CT coronary study from 02/2021 with mild nonobstructive CAD -Echo today with LVEF preserved, no RWMA, no significant valvular disease - ACS ruled out, consider noncardiac etiology for chest pain, pain meds for MUSK pain, etc.   HTN - BP was high yesterday 170s per daughter, on amlodipine and lisinopril, trend BP  Obesity Anemia  Anxiety with depression Chronic pain syndrome Gout -Per primary team    Risk Assessment/Risk Scores:       For questions or updates, please contact [facility] Please consult [email or web address] for contact info under    Signed, Cyndi Bender, NP  07/01/2022 12:06 PM

## 2022-07-01 NOTE — Progress Notes (Signed)
  Echocardiogram 2D Echocardiogram has been performed.  Delcie Roch 07/01/2022, 9:35 AM

## 2022-07-01 NOTE — Discharge Summary (Signed)
Physician Discharge Summary  BAYLIE STRUMPF VHQ:[id number] DOB: Nov 03, 1960 DOA: 06/30/2022  PCP: Park Meo, FNP  Admit date: 06/30/2022 Discharge date: 07/01/2022  Admitted From: Home Disposition:  Home  Discharge Condition:Stable CODE STATUS:FULL Diet recommendation: Heart Healthy   Brief/Interim Summary: Patient is a 62 y.o. male with medical history significant for obesity, hypertension, hyperlipidemia, nonobstructive coronary artery disease, iron deficiency anemia, chronic anxiety/depression, who presents to [facility] ED from home with complaints of chest pain.  The patient describes the chest pain as dull and achy, starting on the right side of his chest radiating across to the left side of his chest into his left arm and into his back.  His troponins were cycled and they were negative.  EKG did not show any signs of ischemia.  Cardiology was consulted.  He underwent full workup including echocardiogram, CT angiogram of the chest/abdomen/pelvis. Echocardiogram showed preserved EF, no regional wall motion abnormality, no significant valvular disease.  CT angiogram was negative for acute findings.  His chest pain is a lot better today. Cardiology has cleared for discharge.  His chest pain is most likely musculoskeletal in etiology.  He is already on high-dose tramadol at home and also on PPI. Patient seen and examined at bedside in the next appointment this afternoon.  He remains hemodynamically stable.  Patient is eager to go home.  We discussed about discharge planning along with daughter at bedside.  He has an appointment with cardiology on 07/15/2022   Discharge Diagnoses:  Principal Problem:   Chest pain    Discharge Instructions  Discharge Instructions     Diet - low sodium heart healthy   Complete by: As directed    Discharge instructions   Complete by: As directed    1)Please take your medications as instructed 2)Follow up with your PCP in a week.  You have an  appointment with cardiology on 07/15/2022   Increase activity slowly   Complete by: As directed       Allergies as of 07/01/2022       Reactions   Pregabalin Other (See Comments), Swelling   Blisters in mouth and sore throat per pt.,  Patient states he has not completely come off as he was taking 3 tab. Daily and now he is taking 1 tablet since the middle of last week, Tuesday 9th of June.   Simvastatin Other (See Comments)   Other reaction(s): interacts with amlodipine   Trazodone And Nefazodone Other (See Comments)   Patients states "it caused my blood pressure to raise and it didn't help me sleep"        Medication List     TAKE these medications    allopurinol 300 MG tablet Commonly known as: ZYLOPRIM Take 1 tablet (300 mg total) by mouth daily.   ALPRAZolam 1 MG tablet Commonly known as: XANAX Take 0.5 tablets (0.5 mg total) by mouth at bedtime as needed for anxiety. What changed:  how much to take reasons to take this   amLODipine 5 MG tablet Commonly known as: NORVASC Take 1 tablet (5 mg total) by mouth daily.   ascorbic acid 500 MG tablet Commonly known as: VITAMIN C Take 500 mg by mouth 2 (two) times daily.   Aspirin Low Dose 81 MG tablet Generic drug: aspirin EC TAKE 1 TABLET(81 MG) BY MOUTH DAILY. SWALLOW WHOLE What changed: See the new instructions.   atorvastatin 80 MG tablet Commonly known as: LIPITOR TAKE 1 TABLET(80 MG) BY MOUTH DAILY What changed: See the  new instructions.   diphenhydrAMINE 25 MG tablet Commonly known as: BENADRYL Take 25 mg by mouth every 6 (six) hours as needed.   DULoxetine 30 MG capsule Commonly known as: Cymbalta Take 1 capsule (30 mg total) by mouth daily. What changed:  how much to take when to take this   ferrous sulfate 325 (65 FE) MG tablet Take 325 mg by mouth daily.   lisinopril 40 MG tablet Commonly known as: ZESTRIL Take 1 tablet (40 mg total) by mouth daily.   Magnesium 250 MG Tabs Take 250 mg by  mouth daily.   metFORMIN 500 MG 24 hr tablet Commonly known as: GLUCOPHAGE-XR Take 2 tablets (1,000 mg total) by mouth 2 (two) times daily with a meal.   multivitamin with minerals Tabs tablet Take 1 tablet by mouth daily. Men over 50   naloxone 4 MG/0.1ML Liqd nasal spray kit Commonly known as: NARCAN Place into the nose as needed.   omeprazole 20 MG tablet Commonly known as: PRILOSEC OTC Take 20 mg by mouth daily.   propranolol 40 MG tablet Commonly known as: INDERAL Take 1 tablet (40 mg total) by mouth daily.   tiZANidine 4 MG tablet Commonly known as: ZANAFLEX Take 2 mg by mouth every 8 (eight) hours as needed for muscle spasms.   traMADol 300 MG 24 hr tablet Commonly known as: ULTRAM-ER Take 300 mg by mouth daily.   vitamin B-12 500 MCG tablet Commonly known as: CYANOCOBALAMIN Take 500 mcg by mouth daily.   Vitamin D3 125 MCG (5000 UT) Tabs Take 5,000 tablets by mouth daily.   zinc sulfate 220 (50 Zn) MG capsule Take 220 mg by mouth daily.   zolpidem 10 MG tablet Commonly known as: AMBIEN Take 1 tablet (10 mg total) by mouth at bedtime.        Follow-up Information     Swinyer, Zachary George, NP Follow up on 07/15/2022.   Specialty: Cardiology Why: at 8:25am for your cardiology follow up appointment Contact information: [address] [place] [address] [phone]                Allergies  Allergen Reactions   Pregabalin Other (See Comments) and Swelling    Blisters in mouth and sore throat per pt.,  Patient states he has not completely come off as he was taking 3 tab. Daily and now he is taking 1 tablet since the middle of last week, Tuesday 9th of June.   Simvastatin Other (See Comments)    Other reaction(s): interacts with amlodipine   Trazodone And Nefazodone Other (See Comments)    Patients states "it caused my blood pressure to raise and it didn't help me sleep"     Consultations: Cardiology   Procedures/Studies: ECHOCARDIOGRAM COMPLETE  Result Date: 07/01/2022    ECHOCARDIOGRAM REPORT   Patient Name:   FARAJI VARDEMAN Date of Exam: 07/01/2022 Medical Rec #:  [id number]         Height:       70.0 in Accession #:    [id number]        Weight:       248.0 lb Date of Birth:  1960-11-10         BSA:          2.287 m Patient Age:    62 years          BP:           133/82 mmHg Patient Gender: M  HR:           70 bpm. Exam Location:  Inpatient Procedure: 2D Echo, Cardiac Doppler and Color Doppler Indications:    Chest pain  History:        Patient has prior history of Echocardiogram examinations, most                 recent 11/16/2015. CAD, Signs/Symptoms:Shortness of Breath and                 Chest Pain; Risk Factors:Hypertension and Dyslipidemia.  Sonographer:    Delcie Roch RDCS Referring Phys: [id number] CAROLE N HALL  Sonographer Comments: Technically difficult study due to poor echo windows. Image acquisition challenging due to patient body habitus and Image acquisition challenging due to respiratory motion. IMPRESSIONS  1. Left ventricular ejection fraction, by estimation, is 60 to 65%. The left ventricle has normal function. The left ventricle has no regional wall motion abnormalities. Left ventricular diastolic parameters were normal.  2. Right ventricular systolic function is normal. The right ventricular size is normal. Tricuspid regurgitation signal is inadequate for assessing PA pressure.  3. The mitral valve is grossly normal. Trivial mitral valve regurgitation. No evidence of mitral stenosis.  4. Focal calcification noted in the aortic root above the NCC. The aortic valve is tricuspid. Aortic valve regurgitation is not visualized. Aortic valve sclerosis is present, with no evidence of aortic valve stenosis.  5. The inferior vena cava is normal in size with greater than 50% respiratory variability, suggesting right atrial pressure of 3  mmHg. FINDINGS  Left Ventricle: Left ventricular ejection fraction, by estimation, is 60 to 65%. The left ventricle has normal function. The left ventricle has no regional wall motion abnormalities. Definity contrast agent was given IV to delineate the left ventricular  endocardial borders. The left ventricular internal cavity size was normal in size. There is no left ventricular hypertrophy. Left ventricular diastolic parameters were normal. Right Ventricle: The right ventricular size is normal. No increase in right ventricular wall thickness. Right ventricular systolic function is normal. Tricuspid regurgitation signal is inadequate for assessing PA pressure. Left Atrium: Left atrial size was normal in size. Right Atrium: Right atrial size was normal in size. Pericardium: There is no evidence of pericardial effusion. Mitral Valve: The mitral valve is grossly normal. Trivial mitral valve regurgitation. No evidence of mitral valve stenosis. Tricuspid Valve: The tricuspid valve is grossly normal. Tricuspid valve regurgitation is not demonstrated. No evidence of tricuspid stenosis. Aortic Valve: Focal calcification noted in the aortic root above the NCC. The aortic valve is tricuspid. Aortic valve regurgitation is not visualized. Aortic valve sclerosis is present, with no evidence of aortic valve stenosis. Pulmonic Valve: The pulmonic valve was grossly normal. Pulmonic valve regurgitation is not visualized. No evidence of pulmonic stenosis. Aorta: The aortic root and ascending aorta are structurally normal, with no evidence of dilitation. Venous: The inferior vena cava is normal in size with greater than 50% respiratory variability, suggesting right atrial pressure of 3 mmHg. IAS/Shunts: The atrial septum is grossly normal.  LEFT VENTRICLE PLAX 2D LVIDd:         5.10 cm   Diastology LVIDs:         3.60 cm   LV e' medial:    12.30 cm/s LV PW:         1.10 cm   LV E/e' medial:  9.2 LV IVS:        1.00 cm   LV e'  lateral:  13.40 cm/s LVOT diam:     2.00 cm   LV E/e' lateral: 8.4 LV SV:         93 LV SV Index:   41 LVOT Area:     3.14 cm  RIGHT VENTRICLE             IVC RV S prime:     16.00 cm/s  IVC diam: 1.70 cm TAPSE (M-mode): 2.9 cm LEFT ATRIUM           Index        RIGHT ATRIUM           Index LA diam:      3.80 cm 1.66 cm/m   RA Area:     16.00 cm LA Vol (A2C): 56.0 ml 24.48 ml/m  RA Volume:   35.80 ml  15.65 ml/m LA Vol (A4C): 76.1 ml 33.27 ml/m  AORTIC VALVE LVOT Vmax:   134.00 cm/s LVOT Vmean:  87.500 cm/s LVOT VTI:    0.295 m  AORTA Ao Root diam: 3.60 cm Ao Asc diam:  2.70 cm MITRAL VALVE MV Area (PHT): 4.49 cm     SHUNTS MV Decel Time: 169 msec     Systemic VTI:  0.30 m MV E velocity: 113.00 cm/s  Systemic Diam: 2.00 cm MV A velocity: 72.80 cm/s MV E/A ratio:  1.55 Lennie Odor MD Electronically signed by Lennie Odor MD Signature Date/Time: 07/01/2022/9:53:00 AM    Final    CT Angio Chest/Abd/Pel for Dissection W and/or W/WO  Result Date: 06/30/2022 CLINICAL DATA:  Chest pain.  Aortic aneurysm suspected. EXAM: CT ANGIOGRAPHY CHEST, ABDOMEN AND PELVIS TECHNIQUE: Non-contrast CT of the chest was initially obtained. Multidetector CT imaging through the chest, abdomen and pelvis was performed using the standard protocol during bolus administration of intravenous contrast. Multiplanar reconstructed images and MIPs were obtained and reviewed to evaluate the vascular anatomy. RADIATION DOSE REDUCTION: This exam was performed according to the departmental dose-optimization program which includes automated exposure control, adjustment of the mA and/or kV according to patient size and/or use of iterative reconstruction technique. CONTRAST:  OMNIPAQUE IOHEXOL 350 MG/ML SOLN COMPARISON:  Renal stones CT abdomen and pelvis 02/19/2022. Cardiac CT 03/07/2021. CT abdomen and pelvis 07/21/2020. CT angio chest abdomen and pelvis 07/27/2015 FINDINGS: CTA CHEST FINDINGS Cardiovascular: Unenhanced images of the  chest demonstrate scattered calcification in the aorta and more prominent calcification in the coronary arteries. No evidence of intramural hematoma. A gallstone is present. Images obtained during arterial phase after intravenous contrast material administration demonstrates normal caliber thoracic aorta. No aortic dissection. Great vessel origins are patent. Heart size is normal. No pericardial effusions. Central pulmonary arteries are well opacified without evidence of central pulmonary embolus. Mediastinum/Nodes: Thyroid gland is unremarkable. Esophagus is decompressed. No significant lymphadenopathy. Subcutaneous cyst demonstrated in the subcutaneous fat posterior to the upper thoracic spine likely representing sebaceous cyst. No change since prior studies. Lungs/Pleura: Lungs are clear. No pleural effusion or pneumothorax. Musculoskeletal: Degenerative changes in the spine. Old fracture deformity of the sternum. No acute bony abnormalities are demonstrated. Review of the MIP images confirms the above findings. CTA ABDOMEN AND PELVIS FINDINGS VASCULAR Aorta: Normal caliber aorta without aneurysm, dissection, vasculitis or significant stenosis. Celiac: Patent without evidence of aneurysm, dissection, vasculitis or significant stenosis. SMA: Patent without evidence of aneurysm, dissection, vasculitis or significant stenosis. Renals: Both renal arteries are patent without evidence of aneurysm, dissection, vasculitis, fibromuscular dysplasia or significant stenosis. IMA: Patent without evidence of aneurysm, dissection, vasculitis  or significant stenosis. Inflow: Patent without evidence of aneurysm, dissection, vasculitis or significant stenosis. Veins: No obvious venous abnormality within the limitations of this arterial phase study. Review of the MIP images confirms the above findings. NON-VASCULAR Hepatobiliary: Cholelithiasis. No gallbladder wall thickening or inflammation. No bile duct dilatation. No focal liver  lesions. Pancreas: Unremarkable. No pancreatic ductal dilatation or surrounding inflammatory changes. Spleen: Normal in size without focal abnormality. Adrenals/Urinary Tract: Adrenal glands are unremarkable. Kidneys are normal, without renal calculi, focal lesion, or hydronephrosis. Bladder is unremarkable. Stomach/Bowel: Stomach, small bowel, and colon are not abnormally distended. Stool fills the colon. No wall thickening or inflammatory changes. Appendix is surgically absent. Lymphatic: No significant lymphadenopathy. Reproductive: Prostate is either diminutive or resected. No lymphadenopathy. There is a rim calcified cystic lesion in the left pelvis measuring 4.1 cm diameter. This is unchanged since the prior study from 2017. Long-term stability suggests benign etiology. Other: No abdominal wall hernia or abnormality. No abdominopelvic ascites. Musculoskeletal: Degenerative changes in the spine. Anterior compression of L2 without change since prior study from 02/19/2022. No acute bony abnormalities are demonstrated. Review of the MIP images confirms the above findings. IMPRESSION: 1. No evidence of aortic aneurysm or dissection. 2. Aortic atherosclerosis. 3. Lungs are clear. 4. No acute process demonstrated in the abdomen or pelvis. Rim calcified cystic structure in the left pelvis is unchanged since at least 2017 suggesting benign etiology. 5. Cholelithiasis without evidence of acute cholecystitis. Electronically Signed   By: Burman Nieves M.D.   On: 06/30/2022 20:51   DG Chest 2 View  Result Date: 06/30/2022 CLINICAL DATA:  Chest pain. EXAM: CHEST - 2 VIEW COMPARISON:  June 24, 2022 FINDINGS: The heart size and mediastinal contours are within normal limits. Both lungs are clear. The visualized skeletal structures are unremarkable. IMPRESSION: No active cardiopulmonary disease. Electronically Signed   By: Aram Candela M.D.   On: 06/30/2022 20:20   DG Chest 2 View  Result Date: 06/24/2022 CLINICAL  DATA:  Hypotension EXAM: CHEST - 2 VIEW COMPARISON:  X-ray 02/19/2022 and older FINDINGS: No consolidation, pneumothorax or effusion. No edema. Normal cardiopericardial silhouette. Degenerative changes are seen along the spine. IMPRESSION: No acute cardiopulmonary disease Electronically Signed   By: Karen Kays M.D.   On: 06/24/2022 12:41      Subjective: Patient seen and examined the bedside today.  Hemodynamically stable.  Comfortable.  Denies any chest pain during my evaluation.  Eager to go home  Discharge Exam: Vitals:   07/01/22 1130 07/01/22 1145  BP: 130/81   Pulse: 63 63  Resp: 16 15  Temp:    SpO2: 95% 97%   Vitals:   07/01/22 1030 07/01/22 1100 07/01/22 1130 07/01/22 1145  BP: 132/77 130/79 130/81   Pulse: (!) 59 63 63 63  Resp: 14 15 16 15   Temp:      TempSrc:      SpO2: 95% 96% 95% 97%  Weight:      Height:        General: Pt is alert, awake, not in acute distress Cardiovascular: RRR, S1/S2 +, no rubs, no gallops Respiratory: CTA bilaterally, no wheezing, no rhonchi Abdominal: Soft, NT, ND, bowel sounds + Extremities: no edema, no cyanosis    The results of significant diagnostics from this hospitalization (including imaging, microbiology, ancillary and laboratory) are listed below for reference.     Microbiology: No results found for this or any previous visit (from the past 240 hour(s)).   Labs: BNP (last 3 results)  No results for input(s): "BNP" in the last 8760 hours. Basic Metabolic Panel: Recent Labs  Lab 06/30/22 1920 06/30/22 2006 06/30/22 2009 07/01/22 0025  NA 139  --  140  --   K 4.3  --  4.5  --   CL 107  --  109  --   CO2 22  --   --   --   GLUCOSE 90  --  82  --   BUN 13  --  17  --   CREATININE 1.02 1.00 1.00 0.90  CALCIUM 9.0  --   --   --    Liver Function Tests: No results for input(s): "AST", "ALT", "ALKPHOS", "BILITOT", "PROT", "ALBUMIN" in the last 168 hours. No results for input(s): "LIPASE", "AMYLASE" in the last 168  hours. No results for input(s): "AMMONIA" in the last 168 hours. CBC: Recent Labs  Lab 06/30/22 1920 06/30/22 2009 07/01/22 0025  WBC 10.3  --  8.9  HGB 11.8* 11.9* 11.6*  HCT 36.0* 35.0* 35.3*  MCV 93.8  --  93.6  PLT 226  --  184   Cardiac Enzymes: No results for input(s): "CKTOTAL", "CKMB", "CKMBINDEX", "TROPONINI" in the last 168 hours. BNP: Invalid input(s): "POCBNP" CBG: No results for input(s): "GLUCAP" in the last 168 hours. D-Dimer No results for input(s): "DDIMER" in the last 72 hours. Hgb A1c No results for input(s): "HGBA1C" in the last 72 hours. Lipid Profile No results for input(s): "CHOL", "HDL", "LDLCALC", "TRIG", "CHOLHDL", "LDLDIRECT" in the last 72 hours. Thyroid function studies No results for input(s): "TSH", "T4TOTAL", "T3FREE", "THYROIDAB" in the last 72 hours.  Invalid input(s): "FREET3" Anemia work up No results for input(s): "VITAMINB12", "FOLATE", "FERRITIN", "TIBC", "IRON", "RETICCTPCT" in the last 72 hours. Urinalysis    Component Value Date/Time   COLORURINE YELLOW 02/19/2022 0749   APPEARANCEUR HAZY (A) 02/19/2022 0749   LABSPEC 1.014 02/19/2022 0749   PHURINE 5.0 02/19/2022 0749   GLUCOSEU NEGATIVE 02/19/2022 0749   HGBUR LARGE (A) 02/19/2022 0749   BILIRUBINUR NEGATIVE 02/19/2022 0749   KETONESUR NEGATIVE 02/19/2022 0749   PROTEINUR NEGATIVE 02/19/2022 0749   UROBILINOGEN 0.2 01/28/2014 2300   NITRITE NEGATIVE 02/19/2022 0749   LEUKOCYTESUR NEGATIVE 02/19/2022 0749   Sepsis Labs Recent Labs  Lab 06/30/22 1920 07/01/22 0025  WBC 10.3 8.9   Microbiology No results found for this or any previous visit (from the past 240 hour(s)).  Please note: You were cared for by a hospitalist during your hospital stay. Once you are discharged, your primary care physician will handle any further medical issues. Please note that NO REFILLS for any discharge medications will be authorized once you are discharged, as it is imperative that you  return to your primary care physician (or establish a relationship with a primary care physician if you do not have one) for your post hospital discharge needs so that they can reassess your need for medications and monitor your lab values.    Time coordinating discharge: 40 minutes  SIGNED:   Burnadette Pop, MD  [facility] 07/01/2022, 2:14 PM Pager [phone]  If 7PM-7AM, please contact night-coverage [email or web address] Password [email or web address]
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
## 2022-07-15 ENCOUNTER — Encounter: Payer: Self-pay | Admitting: Nurse Practitioner

## 2022-07-15 ENCOUNTER — Ambulatory Visit: Payer: Medicare Other | Attending: Nurse Practitioner | Admitting: Nurse Practitioner

## 2022-07-15 VITALS — BP 110/80 | HR 60 | Ht 70.0 in | Wt 244.6 lb

## 2022-07-15 DIAGNOSIS — I10 Essential (primary) hypertension: Secondary | ICD-10-CM

## 2022-07-15 DIAGNOSIS — E785 Hyperlipidemia, unspecified: Secondary | ICD-10-CM | POA: Diagnosis not present

## 2022-07-15 DIAGNOSIS — I2510 Atherosclerotic heart disease of native coronary artery without angina pectoris: Secondary | ICD-10-CM | POA: Diagnosis not present

## 2022-07-15 DIAGNOSIS — R072 Precordial pain: Secondary | ICD-10-CM

## 2022-07-15 DIAGNOSIS — R400 Somnolence: Secondary | ICD-10-CM | POA: Diagnosis not present

## 2022-07-15 NOTE — Patient Instructions (Signed)
Medication Instructions:   Your physician recommends that you continue on your current medications as directed. Please refer to the Current Medication list given to you today.   *If you need a refill on your cardiac medications before your next appointment, please call your pharmacy*   Lab Work:  None ordered.  If you have labs (blood work) drawn today and your tests are completely normal, you will receive your results only by: MyChart Message (if you have MyChart) OR A paper copy in the mail If you have any lab test that is abnormal or we need to change your treatment, we will call you to review the results.   Testing/Procedures:  None ordered.   Follow-Up: At [facility], you and your health needs are our priority.  As part of our continuing mission to provide you with exceptional heart care, we have created designated Provider Care Teams.  These Care Teams include your primary Cardiologist (physician) and Advanced Practice Providers (APPs -  Physician Assistants and Nurse Practitioners) who all work together to provide you with the care you need, when you need it.  We recommend signing up for the patient portal called "MyChart".  Sign up information is provided on this After Visit Summary.  MyChart is used to connect with patients for Virtual Visits (Telemedicine).  Patients are able to view lab/test results, encounter notes, upcoming appointments, etc.  Non-urgent messages can be sent to your provider as well.   To learn more about what you can do with MyChart, go to [email or web address].    Your next appointment:   6 month(s)  Provider:   [name], MD     Other Instructions  Your physician wants you to follow-up in: 6 months with David Hartman will receive a reminder letter in the mail two months in advance. If you don't receive a letter, please call our office to schedule the follow-up appointment.

## 2022-09-01 ENCOUNTER — Other Ambulatory Visit: Payer: Self-pay | Admitting: Internal Medicine

## 2022-09-10 ENCOUNTER — Other Ambulatory Visit: Payer: Self-pay | Admitting: Internal Medicine

## 2022-09-16 ENCOUNTER — Ambulatory Visit (INDEPENDENT_AMBULATORY_CARE_PROVIDER_SITE_OTHER): Payer: Medicare Other | Admitting: Neurology

## 2022-09-16 ENCOUNTER — Encounter: Payer: Self-pay | Admitting: Neurology

## 2022-09-16 VITALS — BP 123/66 | HR 64 | Ht 70.0 in | Wt 253.6 lb

## 2022-09-16 DIAGNOSIS — G4719 Other hypersomnia: Secondary | ICD-10-CM | POA: Diagnosis not present

## 2022-09-16 DIAGNOSIS — E669 Obesity, unspecified: Secondary | ICD-10-CM

## 2022-09-16 DIAGNOSIS — R0683 Snoring: Secondary | ICD-10-CM | POA: Diagnosis not present

## 2022-09-16 DIAGNOSIS — R079 Chest pain, unspecified: Secondary | ICD-10-CM

## 2022-09-16 DIAGNOSIS — Z9189 Other specified personal risk factors, not elsewhere classified: Secondary | ICD-10-CM | POA: Diagnosis not present

## 2022-09-16 DIAGNOSIS — R351 Nocturia: Secondary | ICD-10-CM

## 2022-09-16 NOTE — Patient Instructions (Signed)
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
## 2022-09-26 ENCOUNTER — Other Ambulatory Visit: Payer: Medicare Other

## 2022-09-26 DIAGNOSIS — E785 Hyperlipidemia, unspecified: Secondary | ICD-10-CM

## 2022-09-27 LAB — LIPID PANEL
Cholesterol: 139 mg/dL (ref <200)
HDL: 45 mg/dL (ref >=40)
LDL Cholesterol (Calc): 68 mg/dL
Non-HDL Cholesterol (Calc): 94 mg/dL (calc) (ref <130)
Total CHOL/HDL Ratio: 3.1 (calc) (ref <5.0)
Triglycerides: 181 mg/dL — ABNORMAL HIGH (ref <150)

## 2022-10-02 ENCOUNTER — Encounter: Payer: Self-pay | Admitting: Family Medicine

## 2022-10-02 ENCOUNTER — Ambulatory Visit (INDEPENDENT_AMBULATORY_CARE_PROVIDER_SITE_OTHER): Payer: Medicare Other | Admitting: Family Medicine

## 2022-10-02 VITALS — BP 128/78 | HR 64 | Temp 97.8°F | Ht 70.0 in | Wt 266.0 lb

## 2022-10-02 DIAGNOSIS — R400 Somnolence: Secondary | ICD-10-CM

## 2022-10-02 DIAGNOSIS — Z23 Encounter for immunization: Secondary | ICD-10-CM | POA: Diagnosis not present

## 2022-10-02 DIAGNOSIS — M109 Gout, unspecified: Secondary | ICD-10-CM

## 2022-10-02 DIAGNOSIS — E119 Type 2 diabetes mellitus without complications: Secondary | ICD-10-CM | POA: Diagnosis not present

## 2022-10-02 DIAGNOSIS — E785 Hyperlipidemia, unspecified: Secondary | ICD-10-CM | POA: Diagnosis not present

## 2022-10-02 DIAGNOSIS — K219 Gastro-esophageal reflux disease without esophagitis: Secondary | ICD-10-CM | POA: Diagnosis not present

## 2022-10-02 DIAGNOSIS — I2510 Atherosclerotic heart disease of native coronary artery without angina pectoris: Secondary | ICD-10-CM

## 2022-10-02 DIAGNOSIS — Z7984 Long term (current) use of oral hypoglycemic drugs: Secondary | ICD-10-CM

## 2022-10-02 DIAGNOSIS — Z125 Encounter for screening for malignant neoplasm of prostate: Secondary | ICD-10-CM

## 2022-10-02 DIAGNOSIS — Z1329 Encounter for screening for other suspected endocrine disorder: Secondary | ICD-10-CM

## 2022-10-02 NOTE — Assessment & Plan Note (Signed)
Uric acid level drawn today.

## 2022-10-02 NOTE — Assessment & Plan Note (Signed)
Well controlled. Last A1c 6.1%. Continue Metformin ER 1000mg  BID. A1c today.

## 2022-10-02 NOTE — Assessment & Plan Note (Signed)
Continue statin to reduce ASCVD risk.

## 2022-10-02 NOTE — Assessment & Plan Note (Signed)
Pending sleep study with Neurology.

## 2022-10-02 NOTE — Assessment & Plan Note (Signed)
Well controlled on Omeprazole 20 mg daily.
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
## 2022-10-02 NOTE — Assessment & Plan Note (Signed)
Goal LDL <70. Coronary CT revealed coronary calcium score of 1016, 97th percentile for age/sex/race matched control, mild non-obstructive CAD (25-49%). Continue Atorvastatin and ASA per cardiology.

## 2022-10-07 ENCOUNTER — Ambulatory Visit (INDEPENDENT_AMBULATORY_CARE_PROVIDER_SITE_OTHER): Payer: Medicare Other | Admitting: Neurology

## 2022-10-07 DIAGNOSIS — G4719 Other hypersomnia: Secondary | ICD-10-CM

## 2022-10-07 DIAGNOSIS — R351 Nocturia: Secondary | ICD-10-CM

## 2022-10-07 DIAGNOSIS — R0683 Snoring: Secondary | ICD-10-CM

## 2022-10-07 DIAGNOSIS — G4733 Obstructive sleep apnea (adult) (pediatric): Secondary | ICD-10-CM

## 2022-10-07 DIAGNOSIS — R079 Chest pain, unspecified: Secondary | ICD-10-CM

## 2022-10-07 DIAGNOSIS — E669 Obesity, unspecified: Secondary | ICD-10-CM

## 2022-10-07 DIAGNOSIS — Z9189 Other specified personal risk factors, not elsewhere classified: Secondary | ICD-10-CM

## 2022-10-07 LAB — TEST AUTHORIZATION

## 2022-10-07 LAB — COMPLETE METABOLIC PANEL WITH GFR
AG Ratio: 1.6 (calc) (ref 1.0–2.5)
ALT: 28 U/L (ref 9–46)
AST: 26 U/L (ref 10–35)
Albumin: 4 g/dL (ref 3.6–5.1)
Alkaline phosphatase (APISO): 80 U/L (ref 35–144)
BUN: 18 mg/dL (ref 7–25)
CO2: 27 mmol/L (ref 20–32)
Calcium: 9 mg/dL (ref 8.6–10.3)
Chloride: 106 mmol/L (ref 98–110)
Creat: 0.91 mg/dL (ref 0.70–1.35)
Globulin: 2.5 g/dL (ref 1.9–3.7)
Glucose, Bld: 151 mg/dL — ABNORMAL HIGH (ref 65–99)
Potassium: 4.2 mmol/L (ref 3.5–5.3)
Sodium: 142 mmol/L (ref 135–146)
Total Bilirubin: 0.4 mg/dL (ref 0.2–1.2)
Total Protein: 6.5 g/dL (ref 6.1–8.1)
eGFR: 96 mL/min/{1.73_m2} (ref >=60)

## 2022-10-07 LAB — CBC WITH DIFFERENTIAL/PLATELET
Absolute Monocytes: 454 {cells}/uL (ref 200–950)
Basophils Absolute: 32 {cells}/uL (ref 0–200)
Basophils Relative: 0.4 %
Eosinophils Absolute: 316 {cells}/uL (ref 15–500)
Eosinophils Relative: 3.9 %
HCT: 36.8 % — ABNORMAL LOW (ref 38.5–50.0)
Hemoglobin: 12.3 g/dL — ABNORMAL LOW (ref 13.2–17.1)
Lymphs Abs: 2236 {cells}/uL (ref 850–3900)
MCH: 30.7 pg (ref 27.0–33.0)
MCHC: 33.4 g/dL (ref 32.0–36.0)
MCV: 91.8 fL (ref 80.0–100.0)
MPV: 9.3 fL (ref 7.5–12.5)
Monocytes Relative: 5.6 %
Neutro Abs: 5063 {cells}/uL (ref 1500–7800)
Neutrophils Relative %: 62.5 %
Platelets: 195 10*3/uL (ref 140–400)
RBC: 4.01 10*6/uL — ABNORMAL LOW (ref 4.20–5.80)
RDW: 12.3 % (ref 11.0–15.0)
Total Lymphocyte: 27.6 %
WBC: 8.1 10*3/uL (ref 3.8–10.8)

## 2022-10-07 LAB — HEMOGLOBIN A1C
Hgb A1c MFr Bld: 6 %{Hb} — ABNORMAL HIGH (ref <5.7)
Mean Plasma Glucose: 126 mg/dL
eAG (mmol/L): 7 mmol/L

## 2022-10-07 LAB — TRANSFERRIN: Transferrin: 252 mg/dL (ref 188–341)

## 2022-10-07 LAB — IRON,TIBC AND FERRITIN PANEL
%SAT: 20 % (ref 20–48)
Ferritin: 24 ng/mL (ref 24–380)
Iron: 64 ug/dL (ref 50–180)
TIBC: 321 ug/dL (ref 250–425)

## 2022-10-07 LAB — URIC ACID: Uric Acid, Serum: 5.6 mg/dL (ref 4.0–8.0)

## 2022-10-07 LAB — VITAMIN B12: Vitamin B-12: 722 pg/mL (ref 200–1100)

## 2022-10-07 LAB — PSA: PSA: 0.28 ng/mL (ref <=4.00)

## 2022-10-07 LAB — TSH: TSH: 0.58 m[IU]/L (ref 0.40–4.50)

## 2022-10-08 NOTE — Progress Notes (Signed)
See procedure note.

## 2022-10-10 NOTE — Telephone Encounter (Signed)
Called and spoke w/pt re lab results and per pt he is taking iron.

## 2022-10-14 NOTE — Addendum Note (Signed)
Addended by: Huston Foley on: 10/14/2022 05:52 PM   Modules accepted: Orders
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
## 2022-10-16 ENCOUNTER — Telehealth: Payer: Self-pay | Admitting: *Deleted

## 2022-10-16 NOTE — Telephone Encounter (Signed)
Spoke to pt gave sleep study results  Pt chose [employer] for DME  Pt is stating he is living on a fixed income and he is not able to pay so much a month Informed pt [employer] has a hardship program to help pts help pay for their pap machine Will make [employer] aware Pt states not able to start until Nov. 2024 due to fixed income Made pt f/u appointment with Shanda Bumps ,NP 02/2022 due to the date pt states he can start pap machine Faxed over orders and sent PCP sleep study results this afternoon Pt thanked me calling. Pt aware of insurance compliance

## 2022-10-16 NOTE — Telephone Encounter (Signed)
-----   Message from Huston Foley sent at 10/14/2022  5:52 PM EDT ----- Patient referred by PCP, seen by me on 09/16/2022, patient had HST on 10/07/2022.    Please call and notify the patient that the recent home sleep test showed obstructive sleep apnea. OSA is overall mild, but worth treating to see if he feels better after treatment. To that end I recommend treatment for this in the form of autoPAP, which means, that we don't have to bring him in for a sleep study with CPAP, but will let him try an autoPAP machine at home, through a DME company (of his choice, or as per insurance requirement). The DME representative will educate him on how to use the machine, how to put the mask on, etc. I have placed an order in the chart. Please send referral, talk to patient, send report to referring MD. We will need a FU in sleep clinic in about 2.-3 months post-PAP set up (which is usually an insurance-mandated appointment to monitor compliance), please arrange that with me or one of our NPs. Please also go over the need for compliance with treatment (including the insurance-imposed minimum compliance percentage). Thanks,   Huston Foley, MD, PhD [facility])

## 2022-10-25 ENCOUNTER — Other Ambulatory Visit: Payer: Self-pay | Admitting: Family Medicine

## 2022-11-02 ENCOUNTER — Other Ambulatory Visit: Payer: Self-pay | Admitting: Family Medicine

## 2022-11-17 ENCOUNTER — Other Ambulatory Visit: Payer: Self-pay | Admitting: Family Medicine

## 2022-12-09 ENCOUNTER — Other Ambulatory Visit: Payer: Self-pay | Admitting: Family Medicine

## 2022-12-23 ENCOUNTER — Telehealth: Payer: Self-pay

## 2022-12-23 NOTE — Telephone Encounter (Signed)
Copied from CRM [id number]. Topic: General - Other >> Dec 23, 2022  4:24 PM Sasha H wrote: Reason for CRM: Misty Stanley from [facility] called and stated, pt is requesting his Xanax go to 1 whole pill for 30 days.

## 2022-12-28 IMAGING — CR DG CHEST 2V
2 series · 2 of 2 positions shown · non-contrast
Comparison: Chest two views 11/16/2015

CLINICAL DATA: Chest pain for 2 days

EXAM:
CHEST - 2 VIEW

[w chest pa]
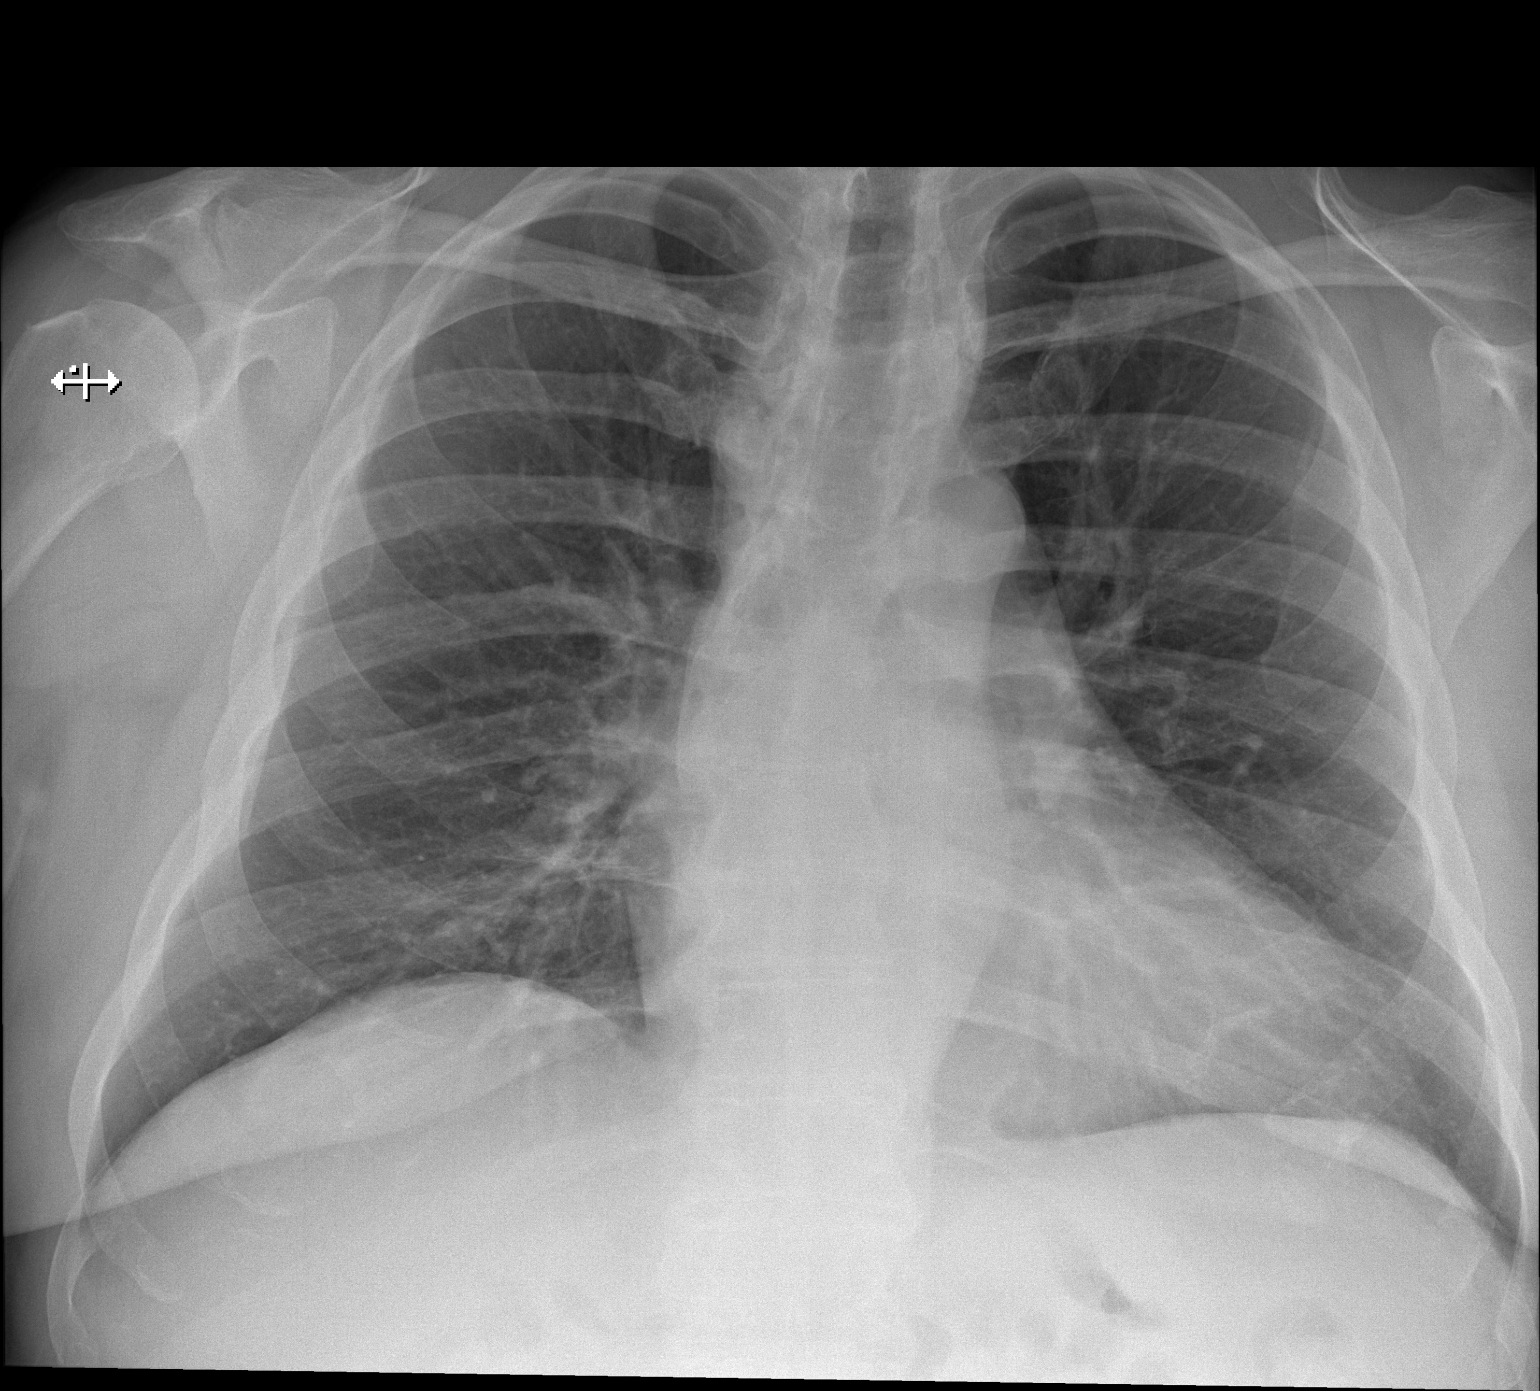

[w chest lat]
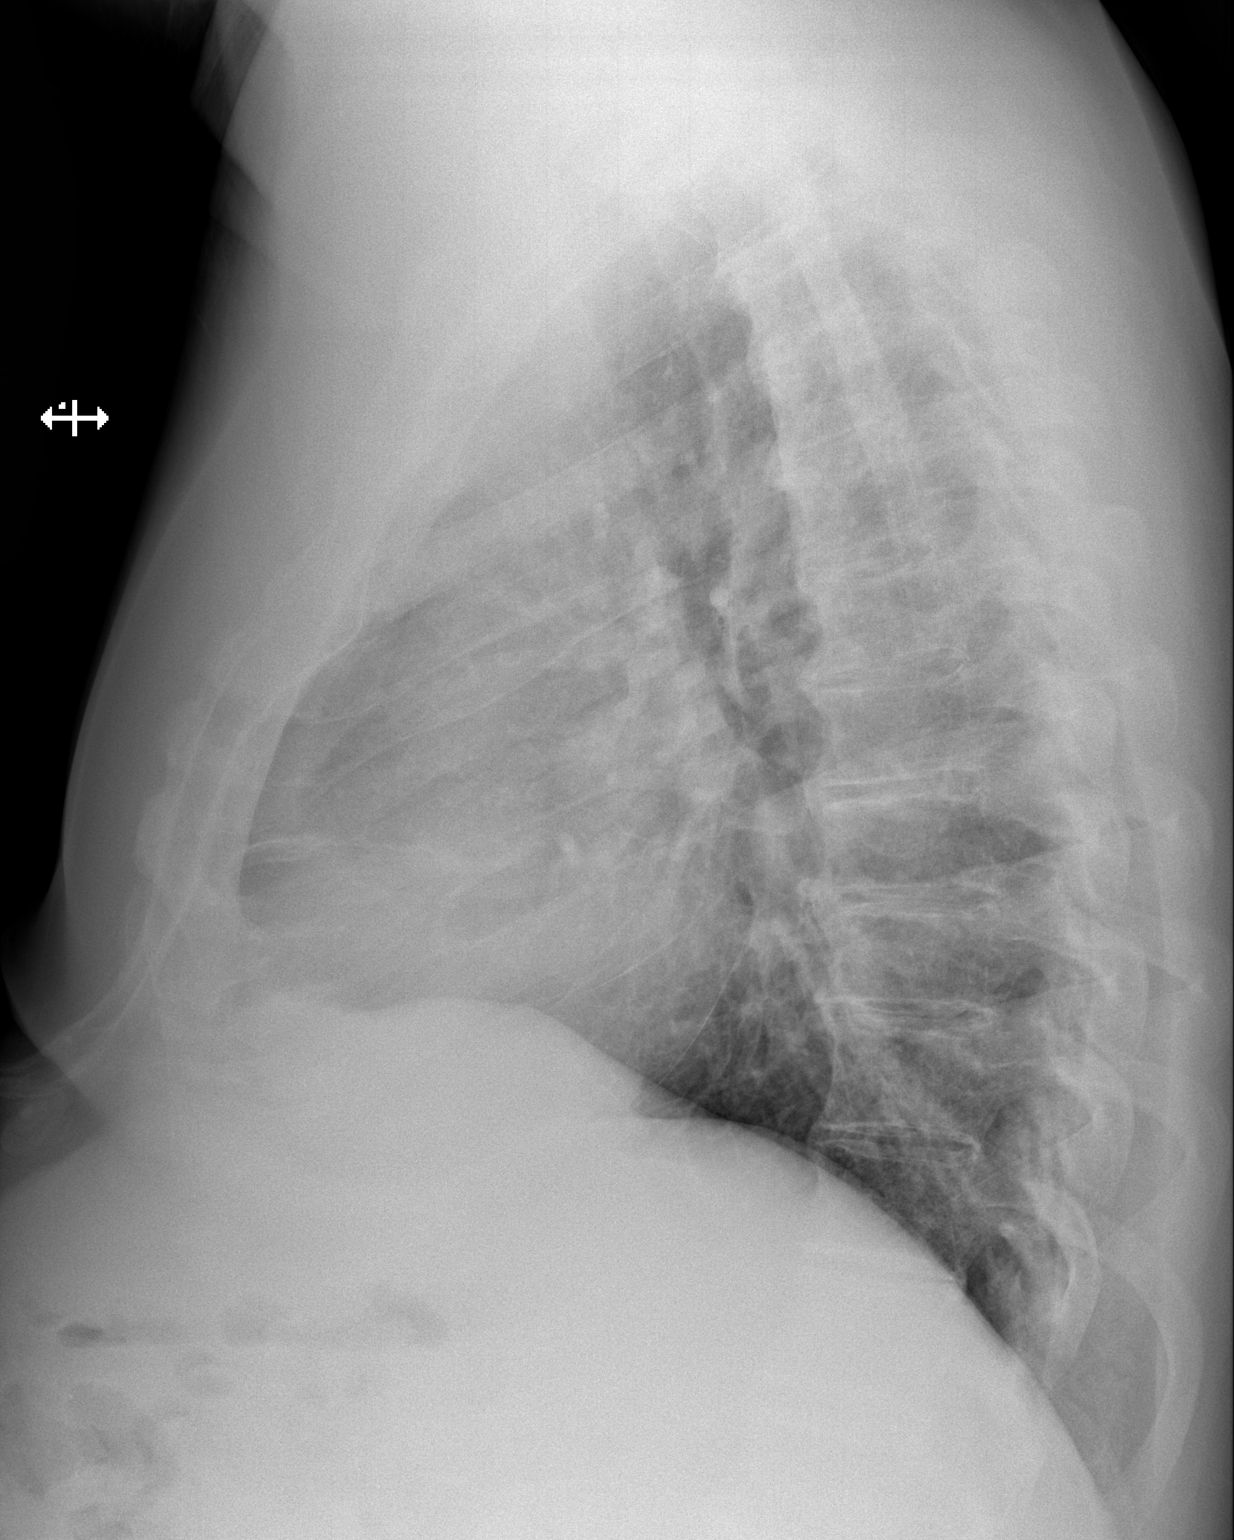

[2 of 2 positions shown; findings below may reference images not displayed]

FINDINGS: Cardiac silhouette and mediastinal contours are unchanged and within
normal limits. The lungs are clear. No pleural effusion or
pneumothorax. Moderate multilevel degenerative disc changes of the
thoracic spine. Minimal dextrocurvature of the midthoracic spine.
IMPRESSION: No active cardiopulmonary disease.

## 2022-12-29 ENCOUNTER — Ambulatory Visit: Payer: Medicare Other | Attending: Internal Medicine | Admitting: Internal Medicine

## 2022-12-29 ENCOUNTER — Encounter: Payer: Self-pay | Admitting: Internal Medicine

## 2022-12-29 VITALS — BP 148/78 | HR 65 | Ht 70.0 in | Wt 261.4 lb

## 2022-12-29 DIAGNOSIS — I2510 Atherosclerotic heart disease of native coronary artery without angina pectoris: Secondary | ICD-10-CM | POA: Diagnosis not present

## 2022-12-29 DIAGNOSIS — I152 Hypertension secondary to endocrine disorders: Secondary | ICD-10-CM

## 2022-12-29 DIAGNOSIS — E1159 Type 2 diabetes mellitus with other circulatory complications: Secondary | ICD-10-CM

## 2022-12-29 DIAGNOSIS — R400 Somnolence: Secondary | ICD-10-CM

## 2022-12-29 DIAGNOSIS — E785 Hyperlipidemia, unspecified: Secondary | ICD-10-CM
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
## 2022-12-29 NOTE — Patient Instructions (Signed)
Medication Instructions:  Your physician recommends that you continue on your current medications as directed. Please refer to the Current Medication list given to you today.  *If you need a refill on your cardiac medications before your next appointment, please call your pharmacy*   Lab Work: NONE If you have labs (blood work) drawn today and your tests are completely normal, you will receive your results only by: MyChart Message (if you have MyChart) OR A paper copy in the mail If you have any lab test that is abnormal or we need to change your treatment, we will call you to review the results.   Testing/Procedures: NONE   Follow-Up: At [facility], you and your health needs are our priority.  As part of our continuing mission to provide you with exceptional heart care, we have created designated Provider Care Teams.  These Care Teams include your primary Cardiologist (physician) and Advanced Practice Providers (APPs -  Physician Assistants and Nurse Practitioners) who all work together to provide you with the care you need, when you need it.  We recommend signing up for the patient portal called "MyChart".  Sign up information is provided on this After Visit Summary.  MyChart is used to connect with patients for Virtual Visits (Telemedicine).  Patients are able to view lab/test results, encounter notes, upcoming appointments, etc.  Non-urgent messages can be sent to your provider as well.   To learn more about what you can do with MyChart, go to [email or web address].    Your next appointment:   4-5 month(s)  Provider:   Eligha Bridegroom, NP

## 2023-01-16 ENCOUNTER — Encounter: Payer: Self-pay | Admitting: Physician Assistant

## 2023-01-16 ENCOUNTER — Ambulatory Visit: Payer: Medicare Other | Admitting: Physician Assistant

## 2023-01-16 DIAGNOSIS — M1711 Unilateral primary osteoarthritis, right knee: Secondary | ICD-10-CM

## 2023-01-16 DIAGNOSIS — M1712 Unilateral primary osteoarthritis, left knee: Secondary | ICD-10-CM | POA: Diagnosis not present

## 2023-01-16 MED ORDER — LIDOCAINE HCL 1 % IJ SOLN
3.0000 mL | INTRAMUSCULAR | Status: AC | PRN
Start: 2023-01-16 — End: 2023-01-16
  Administered 2023-01-16: 3 mL

## 2023-01-16 MED ORDER — METHYLPREDNISOLONE ACETATE 40 MG/ML IJ SUSP
40.0000 mg | INTRAMUSCULAR | Status: AC | PRN
Start: 2023-01-16 — End: 2023-01-16
  Administered 2023-01-16: 40 mg via INTRA_ARTICULAR
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
## 2023-01-19 ENCOUNTER — Ambulatory Visit: Payer: Medicare Other | Admitting: Family Medicine

## 2023-01-22 ENCOUNTER — Telehealth: Payer: Self-pay | Admitting: Adult Health

## 2023-01-22 NOTE — Telephone Encounter (Signed)
 LVM and sent mychart msg informing pt of need to reschedule 02/23/23 appt - NP out

## 2023-01-27 ENCOUNTER — Other Ambulatory Visit: Payer: Self-pay | Admitting: Family Medicine

## 2023-01-27 NOTE — Telephone Encounter (Signed)
 Copied from CRM [id number]. Topic: Clinical - Medication Question >> Jan 27, 2023 11:44 AM Zacyrah J wrote: Reason for CRM: pt would like to know why Dr. Kayla denied medication zoleidem- 10 mg, 11/19- last refill  Pls advise? Looks like last refill was 4/24?

## 2023-02-10 ENCOUNTER — Telehealth: Payer: Self-pay | Admitting: Internal Medicine

## 2023-02-10 MED ORDER — AMLODIPINE BESYLATE 10 MG PO TABS: 10.0000 mg | ORAL_TABLET | Freq: Every day | ORAL | 3 refills | Status: DC

## 2023-02-10 NOTE — Telephone Encounter (Signed)
Called pt reports BP readings range 140's-150's/85-95.  Does not have a record of BP but reports checks daily.  BP today 157/95-84.   Denies HA, SOB.  Reports increased salt intake over the holidays but is working on improving diet.  Also reports joining a gym today.  Pain meds were stopped to see if pain gets any worse.  Has been on pain meds for over 7 years.  Will return to pain management in March for possible pain med.  Is currently taking Ibuprofen and tylenol to manage pain.  Does not feel pain is any worse than when on meds.  Mostly has pain when up an active and at night.   Per MD 12/29/22 [id number] blood pressure at home - Increase amlodipine to 10 mg if systolic pressure consistently exceeds 165 mmHg - Increase cardiovascular exercise and strength training  Will send to MD to advise.

## 2023-02-10 NOTE — Telephone Encounter (Signed)
Called pt advised of MD response: We can increase to 10 mg norvasc.  He should follow up blood pressure and engage either with Amber, his FM NP, or my team if BP is persistently elevated after a couple of weeks on this therapy.   Pt had no questions or concerns.  Script sent to pharmacy of pt request.

## 2023-02-10 NOTE — Telephone Encounter (Signed)
Pt c/o BP issue: STAT if pt c/o blurred vision, one-sided weakness or slurred speech  1. What are your last 5 BP readings?    2. Are you having any other symptoms (ex. Dizziness, headache, blurred vision, passed out)?  Patient is asymptomatic.  3. What is your BP issue?   Patient states during his last appointment he was advised to follow up in a month if BP is still high. He states he is not having any symptoms, but he assumes his medication may need to be changed. Systolic is around 150-155. Diastolic is around 85-90.

## 2023-02-11 ENCOUNTER — Other Ambulatory Visit: Payer: Self-pay | Admitting: Family Medicine

## 2023-02-20 ENCOUNTER — Telehealth: Payer: Self-pay

## 2023-02-20 NOTE — Telephone Encounter (Signed)
Copied from CRM [id number]. Topic: Clinical - Medical Advice >> Feb 20, 2023  9:12 AM David Hartman wrote: Reason for CRM: Patient would like the nurse to give him a call back regarding blood sugar levels//He is getting different number in right and left sticks//Please call

## 2023-02-23 ENCOUNTER — Encounter: Payer: Medicare Other | Admitting: Adult Health

## 2023-03-03 ENCOUNTER — Ambulatory Visit (INDEPENDENT_AMBULATORY_CARE_PROVIDER_SITE_OTHER): Payer: Medicare Other | Admitting: Family Medicine

## 2023-03-03 ENCOUNTER — Encounter: Payer: Self-pay | Admitting: Family Medicine

## 2023-03-03 VITALS — BP 128/78 | HR 61 | Temp 97.5°F | Ht 70.0 in | Wt 269.0 lb

## 2023-03-03 DIAGNOSIS — J019 Acute sinusitis, unspecified: Secondary | ICD-10-CM

## 2023-03-03 DIAGNOSIS — B9689 Other specified bacterial agents as the cause of diseases classified elsewhere: Secondary | ICD-10-CM | POA: Diagnosis not present

## 2023-03-03 DIAGNOSIS — J029 Acute pharyngitis, unspecified: Secondary | ICD-10-CM

## 2023-03-03 DIAGNOSIS — R051 Acute cough: Secondary | ICD-10-CM

## 2023-03-03 MED ORDER — AMOXICILLIN-POT CLAVULANATE 875-125 MG PO TABS: 1.0000 | ORAL_TABLET | Freq: Two times a day (BID) | ORAL | 0 refills | Status: DC

## 2023-03-03 NOTE — Assessment & Plan Note (Addendum)
Symptoms consistent with bacterial sinusitis with 14 days of mucopurulent rhinorrhea, congestion, and sinus pressure and associated low grade fever. Start Augmentin 875-125mg  BID x7d. Viral panel and strep test collected by lab due to symptoms and pt request. Discussed symptomatic management and advised to avoid duplicating ingredients. Return to office if symptoms persist or worsen.
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
## 2023-03-05 LAB — SARS-COV-2 RNA, INFLUENZA A/B, AND RSV RNA, QUALITATIVE NAAT
INFLUENZA A RNA: NOT DETECTED
INFLUENZA B RNA: NOT DETECTED
RSV RNA: NOT DETECTED
SARS COV2 RNA: NOT DETECTED

## 2023-03-05 LAB — CULTURE, GROUP A STREP
Micro Number: 16069082
SPECIMEN QUALITY:: ADEQUATE

## 2023-03-05 LAB — STREP GROUP A AG, W/REFLEX TO CULT: Streptococcus Group A AG: NOT DETECTED

## 2023-03-12 ENCOUNTER — Other Ambulatory Visit: Payer: Self-pay | Admitting: Family Medicine

## 2023-03-25 ENCOUNTER — Other Ambulatory Visit

## 2023-03-25 DIAGNOSIS — E1159 Type 2 diabetes mellitus with other circulatory complications: Secondary | ICD-10-CM

## 2023-03-25 DIAGNOSIS — E119 Type 2 diabetes mellitus without complications: Secondary | ICD-10-CM

## 2023-03-25 DIAGNOSIS — M109 Gout, unspecified: Secondary | ICD-10-CM

## 2023-03-25 DIAGNOSIS — I152 Hypertension secondary to endocrine disorders: Secondary | ICD-10-CM

## 2023-03-25 DIAGNOSIS — E785 Hyperlipidemia, unspecified: Secondary | ICD-10-CM

## 2023-03-25 DIAGNOSIS — Z125 Encounter for screening for malignant neoplasm of prostate: Secondary | ICD-10-CM

## 2023-03-25 DIAGNOSIS — I2510 Atherosclerotic heart disease of native coronary artery without angina pectoris: Secondary | ICD-10-CM

## 2023-03-26 LAB — CBC WITH DIFFERENTIAL/PLATELET
Absolute Lymphocytes: 2211 {cells}/uL (ref 850–3900)
Absolute Monocytes: 778 {cells}/uL (ref 200–950)
Basophils Absolute: 41 {cells}/uL (ref 0–200)
Basophils Relative: 0.5 %
Eosinophils Absolute: 308 {cells}/uL (ref 15–500)
Eosinophils Relative: 3.8 %
HCT: 40 % (ref 38.5–50.0)
Hemoglobin: 12.9 g/dL — ABNORMAL LOW (ref 13.2–17.1)
MCH: 30.5 pg (ref 27.0–33.0)
MCHC: 32.3 g/dL (ref 32.0–36.0)
MCV: 94.6 fL (ref 80.0–100.0)
MPV: 9.6 fL (ref 7.5–12.5)
Monocytes Relative: 9.6 %
Neutro Abs: 4763 {cells}/uL (ref 1500–7800)
Neutrophils Relative %: 58.8 %
Platelets: 203 10*3/uL (ref 140–400)
RBC: 4.23 10*6/uL (ref 4.20–5.80)
RDW: 12.4 % (ref 11.0–15.0)
Total Lymphocyte: 27.3 %
WBC: 8.1 10*3/uL (ref 3.8–10.8)

## 2023-03-26 LAB — LIPID PANEL
Cholesterol: 121 mg/dL (ref <200)
HDL: 54 mg/dL (ref >=40)
LDL Cholesterol (Calc): 48 mg/dL
Non-HDL Cholesterol (Calc): 67 mg/dL (ref <130)
Total CHOL/HDL Ratio: 2.2 (calc) (ref <5.0)
Triglycerides: 107 mg/dL (ref <150)

## 2023-03-26 LAB — HEMOGLOBIN A1C
Hgb A1c MFr Bld: 6.3 %{Hb} — ABNORMAL HIGH (ref <5.7)
Mean Plasma Glucose: 134 mg/dL
eAG (mmol/L): 7.4 mmol/L

## 2023-03-26 LAB — URIC ACID: Uric Acid, Serum: 4.4 mg/dL (ref 4.0–8.0)

## 2023-03-26 LAB — COMPLETE METABOLIC PANEL WITH GFR
AG Ratio: 1.9 (calc) (ref 1.0–2.5)
ALT: 40 U/L (ref 9–46)
AST: 35 U/L (ref 10–35)
Albumin: 4.3 g/dL (ref 3.6–5.1)
Alkaline phosphatase (APISO): 74 U/L (ref 35–144)
BUN: 16 mg/dL (ref 7–25)
CO2: 26 mmol/L (ref 20–32)
Calcium: 8.9 mg/dL (ref 8.6–10.3)
Chloride: 106 mmol/L (ref 98–110)
Creat: 0.79 mg/dL (ref 0.70–1.35)
Globulin: 2.3 g/dL (ref 1.9–3.7)
Glucose, Bld: 113 mg/dL — ABNORMAL HIGH (ref 65–99)
Potassium: 4 mmol/L (ref 3.5–5.3)
Sodium: 140 mmol/L (ref 135–146)
Total Bilirubin: 0.5 mg/dL (ref 0.2–1.2)
Total Protein: 6.6 g/dL (ref 6.1–8.1)
eGFR: 101 mL/min/{1.73_m2} (ref >=60)

## 2023-03-26 LAB — PSA: PSA: 0.46 ng/mL (ref <=4.00)

## 2023-03-27 ENCOUNTER — Other Ambulatory Visit: Payer: Medicare Other

## 2023-04-01 ENCOUNTER — Encounter: Payer: Self-pay | Admitting: Family Medicine

## 2023-04-01 ENCOUNTER — Ambulatory Visit: Payer: Medicare Other | Admitting: Family Medicine

## 2023-04-01 VITALS — BP 130/78 | HR 65 | Temp 97.7°F | Ht 70.0 in | Wt 265.0 lb

## 2023-04-01 DIAGNOSIS — R319 Hematuria, unspecified: Secondary | ICD-10-CM

## 2023-04-01 DIAGNOSIS — M109 Gout, unspecified: Secondary | ICD-10-CM | POA: Diagnosis not present

## 2023-04-01 DIAGNOSIS — I152 Hypertension secondary to endocrine disorders: Secondary | ICD-10-CM

## 2023-04-01 DIAGNOSIS — E785 Hyperlipidemia, unspecified: Secondary | ICD-10-CM

## 2023-04-01 DIAGNOSIS — Z0001 Encounter for general adult medical examination with abnormal findings: Secondary | ICD-10-CM | POA: Diagnosis not present

## 2023-04-01 DIAGNOSIS — Z125 Encounter for screening for malignant neoplasm of prostate: Secondary | ICD-10-CM

## 2023-04-01 DIAGNOSIS — Z0000 Encounter for general adult medical examination without abnormal findings: Secondary | ICD-10-CM | POA: Insufficient documentation

## 2023-04-01 DIAGNOSIS — Z7984 Long term (current) use of oral hypoglycemic drugs: Secondary | ICD-10-CM | POA: Diagnosis not present

## 2023-04-01 DIAGNOSIS — E1159 Type 2 diabetes mellitus with other circulatory complications: Secondary | ICD-10-CM

## 2023-04-01 DIAGNOSIS — E119 Type 2 diabetes mellitus without complications: Secondary | ICD-10-CM

## 2023-04-01 LAB — URINALYSIS, ROUTINE W REFLEX MICROSCOPIC
Bacteria, UA: NONE SEEN /HPF
Bilirubin Urine: NEGATIVE
Glucose, UA: NEGATIVE
Hyaline Cast: NONE SEEN /LPF
Ketones, ur: NEGATIVE
Leukocytes,Ua: NEGATIVE
Nitrite: NEGATIVE
Protein, ur: NEGATIVE
Specific Gravity, Urine: 1.02 (ref 1.001–1.035)
Squamous Epithelial / HPF: NONE SEEN /HPF (ref <=5)
WBC, UA: NONE SEEN /HPF (ref 0–5)
pH: 7 (ref 5.0–8.0)

## 2023-04-01 LAB — MICROSCOPIC MESSAGE

## 2023-04-01 NOTE — Assessment & Plan Note (Signed)
 Well controlled on current regimen. Continue Amlodipine 10mg  daily, Lisinopril 40mg  daily, and propanolol 40mg  daily. Sees cardiology.  His home cuff does read approx 14 points higher than manual cuff readings.  Recommend heart healthy diet such as Mediterranean diet with whole grains, fruits, vegetable, fish, lean meats, nuts, and olive oil. Limit salt. Encouraged moderate walking, 3-5 times/week for 30-50 minutes each session. Aim for at least 150 minutes.week. Goal should be pace of 3 miles/hours, or walking 1.5 miles in 30 minutes. Avoid tobacco products. Avoid excess alcohol. Take medications as prescribed and bring medications and blood pressure log with cuff to each office visit. Seek medical care for chest pain, palpitations, shortness of breath with exertion, dizziness/lightheadedness, vision changes, recurrent headaches, or swelling of extremities. Follow up in 6 months

## 2023-04-01 NOTE — Assessment & Plan Note (Signed)
 Continue Atorvastatin. I recommend consuming a heart healthy diet such as Mediterranean diet or DASH diet with whole grains, fruits, vegetable, fish, lean meats, nuts, and olive oil. Limit sweets and processed foods. I also encourage moderate intensity exercise 150 minutes weekly. This is 3-5 times weekly for 30-50 minutes each session. Goal should be pace of 3 miles/hours, or walking 1.5 miles in 30 minutes. The ASCVD Risk score (Arnett DK, et al., 2019) failed to calculate for the following reasons:   The valid total cholesterol range is 130 to 320 mg/dL
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
## 2023-04-01 NOTE — Assessment & Plan Note (Signed)

## 2023-04-01 NOTE — Assessment & Plan Note (Signed)
 Well controlled on Metformin ER 1000mg  BID. A1c and uACR UTD. Foot exam today. Vaccines UTD. Retinal eye exam UTD. Recommend heart healthy diet such as Mediterranean diet with whole grains, fruits, vegetable, fish, lean meats, nuts, and olive oil. Limit salt. Encouraged moderate walking, 3-5 times/week for 30-50 minutes each session. Aim for at least 150 minutes.week. Goal should be pace of 3 miles/hours, or walking 1.5 miles in 30 minutes. Seek medical care for urinary frequency, extreme thirst, vision changes, lightheadedness, dizziness.  Follow up in 6 months or sooner if needed

## 2023-04-01 NOTE — Assessment & Plan Note (Addendum)
 10-20 RBCs on UA today. Will repeat in 1-2 weeks. Return to office if symptoms return. Followed by Urology.

## 2023-04-02 LAB — B12 AND FOLATE PANEL
Folate: >24 ng/mL
Vitamin B-12: 423 pg/mL (ref 200–1100)

## 2023-04-02 LAB — MICROALBUMIN / CREATININE URINE RATIO
Creatinine, Urine: 79 mg/dL (ref 20–320)
Microalb Creat Ratio: 29 mg/g{creat} (ref <30)
Microalb, Ur: 2.3 mg/dL

## 2023-04-02 LAB — IRON,TIBC AND FERRITIN PANEL
%SAT: 21 % (ref 20–48)
Ferritin: 30 ng/mL (ref 24–380)
Iron: 70 ug/dL (ref 50–180)
TIBC: 332 ug/dL (ref 250–425)

## 2023-04-02 LAB — SEDIMENTATION RATE: Sed Rate: 2 mm/h (ref 0–20)

## 2023-04-02 LAB — RETICULOCYTES
ABS Retic: 76500 {cells}/uL (ref 25000–90000)
Retic Ct Pct: 1.7 %

## 2023-04-16 ENCOUNTER — Ambulatory Visit (INDEPENDENT_AMBULATORY_CARE_PROVIDER_SITE_OTHER): Admitting: Family Medicine

## 2023-04-16 ENCOUNTER — Encounter: Payer: Self-pay | Admitting: Family Medicine

## 2023-04-16 VITALS — BP 140/70 | HR 67 | Temp 97.6°F | Ht 70.0 in | Wt 268.8 lb

## 2023-04-16 DIAGNOSIS — R319 Hematuria, unspecified: Secondary | ICD-10-CM

## 2023-04-16 LAB — URINALYSIS, ROUTINE W REFLEX MICROSCOPIC
Bacteria, UA: NONE SEEN /HPF
Bilirubin Urine: NEGATIVE
Glucose, UA: NEGATIVE
Hyaline Cast: NONE SEEN /LPF
Ketones, ur: NEGATIVE
Leukocytes,Ua: NEGATIVE
Nitrite: NEGATIVE
Specific Gravity, Urine: 1.02 (ref 1.001–1.035)
Squamous Epithelial / HPF: NONE SEEN /HPF (ref <=5)
WBC, UA: NONE SEEN /HPF (ref 0–5)
pH: 6 (ref 5.0–8.0)

## 2023-04-16 LAB — MICROSCOPIC MESSAGE
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
## 2023-04-16 NOTE — Assessment & Plan Note (Signed)
 Improving, asymptomatic. Will recheck in 6 months.

## 2023-04-20 ENCOUNTER — Other Ambulatory Visit: Payer: Self-pay | Admitting: Family Medicine

## 2023-04-30 ENCOUNTER — Ambulatory Visit: Payer: Medicare Other | Admitting: Nurse Practitioner

## 2023-05-07 ENCOUNTER — Ambulatory Visit: Payer: Self-pay

## 2023-05-07 NOTE — Telephone Encounter (Signed)
 Copied from CRM [id number]. Topic: Clinical - Red Word Triage >> May 07, 2023  9:19 AM Ethelle Herb L wrote: Red Word that prompted transfer to Nurse Triage: Stubbed little toe left foot yesterday, toe is black and blue, possibly broken  Chief Complaint: Toe injury Symptoms: Swelling, pain, discoloration Frequency: Yesterday Pertinent Negatives: Patient denies bleeding Disposition: [] ED /[] Urgent Care (no appt availability in office) / [x] Appointment(In office/virtual)/ []  [place] [facility]/ [] Home Care/ [] Refused Recommended Disposition /[] [place] [facility]/ []  Follow-up with PCP Additional Notes: Patient called in to report injury to his left little toe. Patient stated he stubbed his toe yesterday and thinks he broke it. Patient stated the toe is swollen, painful and black and blue. Patient able to walk, but is limping. Advised patient to be seen within 24 hours, per protocol. No availability with PCP/PCP office. Provided care advice and instructed patient to call back if symptoms worsen. Patient complied. Instructed patient that someone from office would reach out regarding scheduling. Please advise.  Reason for Disposition  [1] Toe injury AND [2] bad limp or can't wear shoes/sandals  Answer Assessment - Initial Assessment Questions 1. MECHANISM: "How did the injury happen?"      Stubbed toe yesterday 2. ONSET: "When did the injury happen?" (Minutes or hours ago)      Yesterday afternoon 3. LOCATION: "What part of the toe is injured?" "Is the nail damaged?"      Little toe on left foot 4. APPEARANCE of TOE INJURY: "What does the injury look like?"      Swollen, black and blue 5. SEVERITY: "Can you use the foot normally?" "Can you walk?"      Able to walk normally, but is limping  6. SIZE: For cuts, bruises, or swelling, ask: "How large is it?" (e.g., inches or centimeters;  entire toe)      Entire toe is swollen 7. PAIN: "Is there pain?" If Yes, ask: "How bad is the pain?"   (e.g.,  Scale 1-10; or mild, moderate, severe)     Rate pain a 2 at this time  8. TETANUS: For any breaks in the skin, ask: "When was the last tetanus booster?"     Denies  9. DIABETES: "Do you have a history of diabetes or poor circulation in the feet?"     "Borderline" diabetic  10. OTHER SYMPTOMS: "Do you have any other symptoms?"      Denies bleeding  Protocols used: Toe Injury-A-AH

## 2023-05-09 ENCOUNTER — Other Ambulatory Visit: Payer: Self-pay | Admitting: Family Medicine

## 2023-05-11 ENCOUNTER — Other Ambulatory Visit: Payer: Self-pay | Admitting: Family Medicine

## 2023-05-11 ENCOUNTER — Ambulatory Visit: Admitting: Emergency Medicine

## 2023-05-11 MED ORDER — METFORMIN HCL ER 500 MG PO TB24: 1000.0000 mg | ORAL_TABLET | Freq: Two times a day (BID) | ORAL | 1 refills | Status: DC

## 2023-05-11 MED ORDER — LISINOPRIL 40 MG PO TABS: 40.0000 mg | ORAL_TABLET | Freq: Every day | ORAL | 1 refills | Status: DC

## 2023-05-11 MED ORDER — PROPRANOLOL HCL 40 MG PO TABS: 40.0000 mg | ORAL_TABLET | Freq: Every day | ORAL | 1 refills | Status: DC

## 2023-05-11 NOTE — Telephone Encounter (Signed)
 Copied from CRM [id number]. Topic: Clinical - Prescription Issue >> May 11, 2023 11:18 AM Baldemar Lev wrote: Reason for CRM: Pt wants his PCP to know that she "needs to get busy because he is almost out of his medication"   Advised that medications are currently pending.   Best contact: [phone]

## 2023-05-11 NOTE — Telephone Encounter (Signed)
 Requested Prescriptions  Pending Prescriptions Disp Refills   lisinopril  (ZESTRIL ) 40 MG tablet 90 tablet 1    Sig: Take 1 tablet (40 mg total) by mouth daily.     Cardiovascular:  ACE Inhibitors Failed - 05/11/2023  1:44 PM      Failed - Last BP in normal range    BP Readings from Last 1 Encounters:  04/16/23 (!) 140/70         Passed - Cr in normal range and within 180 days    Creat  Date Value Ref Range Status  03/25/2023 0.79 0.70 - 1.35 mg/dL Final   Creatinine, Urine  Date Value Ref Range Status  04/01/2023 79 20 - 320 mg/dL Final         Passed - K in normal range and within 180 days    Potassium  Date Value Ref Range Status  03/25/2023 4.0 3.5 - 5.3 mmol/L Final         Passed - Patient is not pregnant      Passed - Valid encounter within last 6 months    Recent Outpatient Visits           3 weeks ago Hematuria, unspecified type   [place] [facility] Jenelle Mis, FNP   1 month ago Physical exam, annual   [place] [facility] Jenelle Mis, FNP   2 months ago Acute bacterial sinusitis   [place] [facility] Jenelle Mis, FNP   7 months ago Gastroesophageal reflux disease, unspecified whether esophagitis present   [place] [facility] Jenelle Mis, FNP   10 months ago Daytime somnolence   [place] [facility] Gwen Lek, Schuyler Custard, FNP       Future Appointments             In 1 month Renford Cartwright, [place], NP  HeartCare at [facility]             metFORMIN  (GLUCOPHAGE -XR) 500 MG 24 hr tablet 180 tablet 1    Sig: Take 2 tablets (1,000 mg total) by mouth 2 (two) times daily with a meal. TAKE 2 TABLETS(1000 MG) BY MOUTH TWICE DAILY WITH A MEAL     Endocrinology:  Diabetes - Biguanides Passed - 05/11/2023  1:44 PM      Passed - Cr in normal range and within 360 days    Creat  Date Value Ref Range Status  03/25/2023 0.79 0.70 - 1.35  mg/dL Final   Creatinine, Urine  Date Value Ref Range Status  04/01/2023 79 20 - 320 mg/dL Final         Passed - HBA1C is between 0 and 7.9 and within 180 days    Hgb A1c MFr Bld  Date Value Ref Range Status  03/25/2023 6.3 (H) <5.7 % of total Hgb Final    Comment:    For someone without known diabetes, a hemoglobin  A1c value between 5.7% and 6.4% is consistent with prediabetes and should be confirmed with a  follow-up test. . For someone with known diabetes, a value <7% indicates that their diabetes is well controlled. A1c targets should be individualized based on duration of diabetes, age, comorbid conditions, and other considerations. . This assay result is consistent with an increased risk of diabetes. . Currently, no consensus exists regarding use of hemoglobin A1c for diagnosis of diabetes for children. Aaron Aas  Passed - eGFR in normal range and within 360 days    GFR calc Af Amer  Date Value Ref Range Status  12/04/2017 >60 >60 mL/min Final    Comment:    (NOTE) The eGFR has been calculated using the CKD EPI equation. This calculation has not been validated in all clinical situations. eGFR's persistently <60 mL/min signify possible Chronic Kidney Disease.    GFR, Estimated  Date Value Ref Range Status  07/01/2022 >60 >60 mL/min Final    Comment:    (NOTE) Calculated using the CKD-EPI Creatinine Equation (2021) Performed at [facility], [address] [place], [place] [address]    eGFR  Date Value Ref Range Status  03/25/2023 101 > OR = 60 mL/min/1.24m2 Final  02/15/2021 103 >59 mL/min/1.73 Final         Passed - B12 Level in normal range and within 720 days    Vitamin B-12  Date Value Ref Range Status  04/01/2023 423 200 - 1,100 pg/mL Final         Passed - Valid encounter within last 6 months    Recent Outpatient Visits           3 weeks ago Hematuria, unspecified type   [place] [facility] Jenelle Mis, FNP   1 month ago Physical exam, annual   [place] [facility] Jenelle Mis, FNP   2 months ago Acute bacterial sinusitis   [place] [facility] Jenelle Mis, FNP   7 months ago Gastroesophageal reflux disease, unspecified whether esophagitis present   [place] [facility] Jenelle Mis, FNP   10 months ago Daytime somnolence   [place] [facility] Jenelle Mis, FNP       Future Appointments             In 1 month Renford Cartwright, [place], NP  HeartCare at [facility] - CBC within normal limits and completed in the last 12 months    WBC  Date Value Ref Range Status  03/25/2023 8.1 3.8 - 10.8 Thousand/uL Final   RBC  Date Value Ref Range Status  03/25/2023 4.23 4.20 - 5.80 Million/uL Final   Hemoglobin  Date Value Ref Range Status  03/25/2023 12.9 (L) 13.2 - 17.1 g/dL Final   HCT  Date Value Ref Range Status  03/25/2023 40.0 38.5 - 50.0 % Final   MCHC  Date Value Ref Range Status  03/25/2023 32.3 32.0 - 36.0 g/dL Final    Comment:    For adults, a slight decrease in the calculated MCHC value (in the range of 30 to 32 g/dL) is most likely not clinically significant; however, it should be interpreted with caution in correlation with other red cell parameters and the patient's clinical condition.    [facility]  Date Value Ref Range Status  03/25/2023 30.5 27.0 - 33.0 pg Final   MCV  Date Value Ref Range Status  03/25/2023 94.6 80.0 - 100.0 fL Final   No results found for: "PLTCOUNTKUC", "LABPLAT", "POCPLA" RDW  Date Value Ref Range Status  03/25/2023 12.4 11.0 - 15.0 % Final          propranolol  (INDERAL ) 40 MG tablet 90 tablet 1    Sig: Take 1 tablet (40 mg total) by mouth daily.     Cardiovascular:  Beta Blockers Failed -  05/11/2023  1:44 PM      Failed - Last BP in normal range    BP Readings from Last 1 Encounters:  04/16/23 (!)  140/70         Passed - Last Heart Rate in normal range    Pulse Readings from Last 1 Encounters:  04/16/23 67         Passed - Valid encounter within last 6 months    Recent Outpatient Visits           3 weeks ago Hematuria, unspecified type   [place] [facility] Jenelle Mis, FNP   1 month ago Physical exam, annual   [place] [facility] Jenelle Mis, FNP   2 months ago Acute bacterial sinusitis   [place] [facility] Jenelle Mis, FNP   7 months ago Gastroesophageal reflux disease, unspecified whether esophagitis present   [place] [facility] Jenelle Mis, FNP   10 months ago Daytime somnolence   [place] [facility] Gwen Lek, Schuyler Custard, FNP       Future Appointments             In 1 month Renford Cartwright, Shauna Del, NP  HeartCare at [facility]

## 2023-05-12 ENCOUNTER — Ambulatory Visit: Admitting: Family Medicine

## 2023-05-12 ENCOUNTER — Encounter: Payer: Self-pay | Admitting: Family Medicine

## 2023-05-12 VITALS — BP 120/72 | HR 58 | Temp 97.7°F | Ht 70.0 in | Wt 260.6 lb

## 2023-05-12 DIAGNOSIS — S99922A Unspecified injury of left foot, initial encounter: Secondary | ICD-10-CM | POA: Diagnosis not present

## 2023-05-12 NOTE — Assessment & Plan Note (Signed)
 Minor bruising to pedal aspect of left 5th toe with mild tenderness to palpation. Normal exam otherwise, no skin breakdown, no misalignment. He declines x-ray and no signs of fracture proximal to distal phalanx. Continue ice, NSAIDs, buddy taping.
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
## 2023-05-18 ENCOUNTER — Other Ambulatory Visit: Payer: Self-pay | Admitting: Family Medicine

## 2023-06-15 ENCOUNTER — Other Ambulatory Visit: Payer: Self-pay | Admitting: Internal Medicine

## 2023-06-18 ENCOUNTER — Ambulatory Visit: Attending: Emergency Medicine | Admitting: Emergency Medicine

## 2023-06-18 ENCOUNTER — Other Ambulatory Visit: Payer: Self-pay | Admitting: Internal Medicine

## 2023-06-18 ENCOUNTER — Encounter: Payer: Self-pay | Admitting: Emergency Medicine

## 2023-06-18 VITALS — BP 136/70 | HR 60 | Ht 70.0 in | Wt 260.0 lb

## 2023-06-18 DIAGNOSIS — E1159 Type 2 diabetes mellitus with other circulatory complications: Secondary | ICD-10-CM

## 2023-06-18 DIAGNOSIS — I440 Atrioventricular block, first degree: Secondary | ICD-10-CM | POA: Diagnosis not present

## 2023-06-18 DIAGNOSIS — G4733 Obstructive sleep apnea (adult) (pediatric): Secondary | ICD-10-CM

## 2023-06-18 DIAGNOSIS — I2510 Atherosclerotic heart disease of native coronary artery without angina pectoris: Secondary | ICD-10-CM | POA: Diagnosis not present

## 2023-06-18 DIAGNOSIS — E785 Hyperlipidemia, unspecified: Secondary | ICD-10-CM

## 2023-06-18 DIAGNOSIS — I152 Hypertension secondary to endocrine disorders: Secondary | ICD-10-CM

## 2023-06-18 DIAGNOSIS — E119 Type 2 diabetes mellitus without complications: Secondary | ICD-10-CM

## 2023-06-18 MED ORDER — ASPIRIN 81 MG PO TBEC: 81.0000 mg | DELAYED_RELEASE_TABLET | Freq: Every day | ORAL | 3 refills | Status: AC

## 2023-06-18 NOTE — Patient Instructions (Signed)
 Medication Instructions:  NO CHANGES  Lab Work: NONE  Testing/Procedures: NONE  Follow-Up: At [facility], you and your health needs are our priority.  As part of our continuing mission to provide you with exceptional heart care, our providers are all part of one team.  This team includes your primary Cardiologist (physician) and Advanced Practice Providers or APPs (Physician Assistants and Nurse Practitioners) who all work together to provide you with the care you need, when you need it.  Your next appointment:   6 MONTHS  Provider:   Jann Melody, MD OR [place], [place]

## 2023-06-18 NOTE — Progress Notes (Signed)
 Cardiology Office Note:    Date:  06/18/2023  ID:  David Hartman, DOB [birth year], MRN [id number] PCP: Jenelle Mis, FNP  [facility] Providers Cardiologist:  Jann Melody, MD       Patient Profile:       Chief Complaint: 68-month follow-up for hypertension History of Present Illness:  David Hartman is a 63 y.o. male with visit-pertinent history of hypertension, hyperlipidemia, coronary artery disease, morbid obesity, chronic back pain, IDA, sinus bradycardia first HB  Previously seen by Dr. Nicholette Barley for chest pain felt to be MSK when normal stress echo.  Follow-up in 2017 echocardiogram 2017 showed normal LV function with grade 2 DD.  He reestablished care with cardiology on 02/15/2021 for chest pain.  He had prior negative stress echo 2014.  Normal nuclear stress test 07/2016 for surgical clearance.  Coronary CTA was ordered which revealed coronary calcium  score of 1016 (97th percentile).  He was advised to increase statin intensity with atorvastatin  80 mg and Zetia 10 mg daily and start aspirin  81 mg daily.  He was admitted 6/10 through 07/01/2022 for chest pain.  He was working on his trailer for 4 hours and when he got out he had pain across the entire chest.  Seen by cardiology.  Troponin negative x 2 and unremarkable EKG.  Chest x-ray and CT chest negative for acute pathology.  His TTE was normal.  Suspicion for MSK pain.  He was last seen in office on 12/29/2022.  Patient's blood pressure was elevated in office.  Noted his home cuff reads 15 mmHg higher.  Nonpharmacological approach was taken.  He was to monitor his blood pressure at home and increase cardiovascular exercise.  He was to follow-up in 4-5 months.   Discussed the use of AI scribe software for clinical note transcription with the patient, who gave verbal consent to proceed.  History of Present Illness David Hartman is a 63 year old male with hypertension and mild non-obstructive coronary artery  disease who presents for follow-up on blood pressure management and cardiovascular health.   He takes amlodipine  daily, with blood pressure readings mostly in the 120s and 130s, occasionally reaching 140. He attributes higher readings to dietary choices. He is satisfied with his current blood pressure control.  Does have some very mild trace pedal edema bilaterally.  Edema not lifestyle limiting.  He has mild non-obstructive coronary artery disease with no chest pain or shortness of breath during exercise. He exercises three times a week, cycling four miles on a stationary bike and using weight machines. He experiences knee pain during exercise but no other significant issues.  He takes metformin  for diabetes, with an A1c of 6.3%. He does not smoke and drinks alcohol minimally, about one beer a month.  He is without any chest pain, dyspnea, orthopnea, PND, syncope, palpitations.  Review of systems:  Please see the history of present illness. All other systems are reviewed and otherwise negative.      Studies Reviewed:        Echocardiogram 07/01/2022  1. Left ventricular ejection fraction, by estimation, is 60 to 65%. The  left ventricle has normal function. The left ventricle has no regional  wall motion abnormalities. Left ventricular diastolic parameters were  normal.   2. Right ventricular systolic function is normal. The right ventricular  size is normal. Tricuspid regurgitation signal is inadequate for assessing  PA pressure.   3. The mitral valve is grossly normal. Trivial mitral valve  regurgitation.  No evidence of mitral stenosis.   4. Focal calcification noted in the aortic root above the NCC. The aortic  valve is tricuspid. Aortic valve regurgitation is not visualized. Aortic  valve sclerosis is present, with no evidence of aortic valve stenosis.   5. The inferior vena cava is normal in size with greater than 50%  respiratory variability, suggesting right atrial pressure of  3 mmHg.   Coronary CTA 03/07/2021 1. Coronary calcium  score of 1016. This was 97th percentile for age, sex, and race matched control.   2. Normal coronary origin with left dominance.   3. Aortic atherosclerosis.   4. CAD-RADS 2. Mild non-obstructive CAD (25-49%). Consider non-atherosclerotic causes of chest pain. Consider preventive therapy and risk factor modification. Risk Assessment/Calculations:              Physical Exam:   VS:  BP 136/70 (BP Location: Right Arm, Patient Position: Sitting, Cuff Size: Normal)   Pulse 60   Ht 5\' 10"  (1.778 m)   Wt 260 lb (117.9 kg)   SpO2 95%   BMI 37.31 kg/m    Wt Readings from Last 3 Encounters:  06/18/23 260 lb (117.9 kg)  05/12/23 260 lb 9.6 oz (118.2 kg)  04/16/23 268 lb 12.8 oz (121.9 kg)    GEN: Well nourished, well developed in no acute distress NECK: No JVD; No carotid bruits CARDIAC: RRR, no murmurs, rubs, gallops RESPIRATORY:  Clear to auscultation without rales, wheezing or rhonchi  ABDOMEN: Soft, non-tender, non-distended EXTREMITIES:  No edema; No acute deformity       Assessment and Plan:  Hypertension Blood pressure today 136/70 and well-controlled and at goal Home blood pressures average 120s-130s / 70s, taken every day Has recently joined a gym and is maintaining physical exercise weekly - Maintain home BP log and monitoring - Continue amlodipine  10 mg, propranolol  40 mg daily, and lisinopril  40 mg daily  Coronary artery disease Coronary CTA 02/2021 showed coronary calcium  score of 1016 (96 percentile) with mild nonobstructive CAD (25-49%) - Today patient is without any anginal symptoms.  He's in the gym at least 3 times weekly and rides stationary bike and denies any exertional symptoms.  There is no indication for further ischemic evaluation at this time - Maintain physical exercise and heart healthy diet - Continue aspirin  81 mg daily and atorvastatin  80 mg daily  Sinus bradycardia with 1st HB HR today 60  bpm Denies any syncope, presyncope, lightheadedness, dizziness - No evidence of high-grade AV block - Continue to clinically monitor  Mild OSA He tells me he was diagnosed with OSA in the past however he had deferred CPAP therapy and is not interested in starting CPAP therapy at this time  Hyperlipidemia LDL 48, TG 107, TC 121 on 05/4096 LDL under excellent control and under goal less than 70 - Continue atorvastatin  80 mg daily  Type 2 diabetes A1c 6.3% on 03/2023 - Managed on metformin  by PCP     Dispo:  Return in about 6 months (around 12/19/2023).  Signed, Ava Boatman, NP

## 2023-06-23 ENCOUNTER — Other Ambulatory Visit: Payer: Self-pay

## 2023-06-23 ENCOUNTER — Inpatient Hospital Stay (HOSPITAL_COMMUNITY)
Admission: EM | Admit: 2023-06-23 | Discharge: 2023-06-29 | DRG: 552 | Disposition: A | Discharge status: Home Health | Attending: Internal Medicine | Admitting: Internal Medicine

## 2023-06-23 ENCOUNTER — Emergency Department (HOSPITAL_COMMUNITY)

## 2023-06-23 ENCOUNTER — Encounter (HOSPITAL_COMMUNITY): Payer: Self-pay | Admitting: Emergency Medicine

## 2023-06-23 DIAGNOSIS — Z85828 Personal history of other malignant neoplasm of skin: Secondary | ICD-10-CM

## 2023-06-23 DIAGNOSIS — Z888 Allergy status to other drugs, medicaments and biological substances status: Secondary | ICD-10-CM

## 2023-06-23 DIAGNOSIS — M5432 Sciatica, left side: Principal | ICD-10-CM

## 2023-06-23 DIAGNOSIS — M5116 Intervertebral disc disorders with radiculopathy, lumbar region: Secondary | ICD-10-CM | POA: Diagnosis present

## 2023-06-23 DIAGNOSIS — E7800 Pure hypercholesterolemia, unspecified: Secondary | ICD-10-CM | POA: Diagnosis present

## 2023-06-23 DIAGNOSIS — E785 Hyperlipidemia, unspecified: Secondary | ICD-10-CM | POA: Diagnosis present

## 2023-06-23 DIAGNOSIS — M5126 Other intervertebral disc displacement, lumbar region: Principal | ICD-10-CM | POA: Diagnosis present

## 2023-06-23 DIAGNOSIS — Z87442 Personal history of urinary calculi: Secondary | ICD-10-CM

## 2023-06-23 DIAGNOSIS — Z7984 Long term (current) use of oral hypoglycemic drugs: Secondary | ICD-10-CM

## 2023-06-23 DIAGNOSIS — E66812 Obesity, class 2: Secondary | ICD-10-CM | POA: Diagnosis present

## 2023-06-23 DIAGNOSIS — Z800 Family history of malignant neoplasm of digestive organs: Secondary | ICD-10-CM

## 2023-06-23 DIAGNOSIS — F32A Depression, unspecified: Secondary | ICD-10-CM | POA: Diagnosis present

## 2023-06-23 DIAGNOSIS — G894 Chronic pain syndrome: Secondary | ICD-10-CM | POA: Diagnosis present

## 2023-06-23 DIAGNOSIS — K219 Gastro-esophageal reflux disease without esophagitis: Secondary | ICD-10-CM | POA: Diagnosis present

## 2023-06-23 DIAGNOSIS — Z8249 Family history of ischemic heart disease and other diseases of the circulatory system: Secondary | ICD-10-CM

## 2023-06-23 DIAGNOSIS — Z79899 Other long term (current) drug therapy: Secondary | ICD-10-CM

## 2023-06-23 DIAGNOSIS — E119 Type 2 diabetes mellitus without complications: Secondary | ICD-10-CM

## 2023-06-23 DIAGNOSIS — M1612 Unilateral primary osteoarthritis, left hip: Secondary | ICD-10-CM | POA: Diagnosis present

## 2023-06-23 DIAGNOSIS — I152 Hypertension secondary to endocrine disorders: Secondary | ICD-10-CM | POA: Diagnosis present

## 2023-06-23 DIAGNOSIS — M51379 Other intervertebral disc degeneration, lumbosacral region without mention of lumbar back pain or lower extremity pain: Secondary | ICD-10-CM

## 2023-06-23 DIAGNOSIS — E1159 Type 2 diabetes mellitus with other circulatory complications: Secondary | ICD-10-CM | POA: Diagnosis present

## 2023-06-23 DIAGNOSIS — I2510 Atherosclerotic heart disease of native coronary artery without angina pectoris: Secondary | ICD-10-CM | POA: Diagnosis present

## 2023-06-23 DIAGNOSIS — F419 Anxiety disorder, unspecified: Secondary | ICD-10-CM | POA: Diagnosis present

## 2023-06-23 DIAGNOSIS — Z7982 Long term (current) use of aspirin: Secondary | ICD-10-CM

## 2023-06-23 DIAGNOSIS — M109 Gout, unspecified: Secondary | ICD-10-CM | POA: Diagnosis present

## 2023-06-23 DIAGNOSIS — Z833 Family history of diabetes mellitus: Secondary | ICD-10-CM

## 2023-06-23 DIAGNOSIS — S72009A Fracture of unspecified part of neck of unspecified femur, initial encounter for closed fracture: Secondary | ICD-10-CM | POA: Insufficient documentation

## 2023-06-23 DIAGNOSIS — Z8546 Personal history of malignant neoplasm of prostate: Secondary | ICD-10-CM

## 2023-06-23 DIAGNOSIS — M48061 Spinal stenosis, lumbar region without neurogenic claudication: Secondary | ICD-10-CM | POA: Diagnosis present

## 2023-06-23 DIAGNOSIS — M84459A Pathological fracture, hip, unspecified, initial encounter for fracture: Secondary | ICD-10-CM | POA: Diagnosis present

## 2023-06-23 DIAGNOSIS — Z885 Allergy status to narcotic agent status: Secondary | ICD-10-CM

## 2023-06-23 DIAGNOSIS — Z6837 Body mass index (BMI) 37.0-37.9, adult: Secondary | ICD-10-CM

## 2023-06-23 DIAGNOSIS — R52 Pain, unspecified: Secondary | ICD-10-CM | POA: Diagnosis present

## 2023-06-23 DIAGNOSIS — M5117 Intervertebral disc disorders with radiculopathy, lumbosacral region: Secondary | ICD-10-CM | POA: Diagnosis present

## 2023-06-23 DIAGNOSIS — M25552 Pain in left hip: Secondary | ICD-10-CM | POA: Diagnosis present

## 2023-06-23 NOTE — ED Triage Notes (Signed)
 Patient c/o  left hip pain effecting his ability to walk.  Patient states this pain started this evening.  Patient gives verbal consent for MSE.

## 2023-06-24 ENCOUNTER — Emergency Department (HOSPITAL_COMMUNITY)

## 2023-06-24 DIAGNOSIS — F329 Major depressive disorder, single episode, unspecified: Secondary | ICD-10-CM | POA: Diagnosis not present

## 2023-06-24 DIAGNOSIS — I2510 Atherosclerotic heart disease of native coronary artery without angina pectoris: Secondary | ICD-10-CM

## 2023-06-24 DIAGNOSIS — E785 Hyperlipidemia, unspecified: Secondary | ICD-10-CM

## 2023-06-24 DIAGNOSIS — E119 Type 2 diabetes mellitus without complications: Secondary | ICD-10-CM

## 2023-06-24 DIAGNOSIS — K219 Gastro-esophageal reflux disease without esophagitis: Secondary | ICD-10-CM

## 2023-06-24 DIAGNOSIS — F419 Anxiety disorder, unspecified: Secondary | ICD-10-CM | POA: Diagnosis not present

## 2023-06-24 DIAGNOSIS — G894 Chronic pain syndrome: Secondary | ICD-10-CM

## 2023-06-24 DIAGNOSIS — R52 Pain, unspecified: Secondary | ICD-10-CM | POA: Diagnosis present

## 2023-06-24 DIAGNOSIS — Z8546 Personal history of malignant neoplasm of prostate: Secondary | ICD-10-CM

## 2023-06-24 DIAGNOSIS — I152 Hypertension secondary to endocrine disorders: Secondary | ICD-10-CM

## 2023-06-24 DIAGNOSIS — E1159 Type 2 diabetes mellitus with other circulatory complications: Secondary | ICD-10-CM

## 2023-06-24 DIAGNOSIS — M109 Gout, unspecified: Secondary | ICD-10-CM

## 2023-06-24 LAB — GLUCOSE, CAPILLARY
Glucose-Capillary: 170 mg/dL — ABNORMAL HIGH (ref 70–99)
Glucose-Capillary: 169 mg/dL — ABNORMAL HIGH (ref 70–99)

## 2023-06-24 LAB — CBC WITH DIFFERENTIAL/PLATELET
Abs Immature Granulocytes: 0.04 10*3/uL (ref 0.00–0.07)
Basophils Absolute: 0 10*3/uL (ref 0.0–0.1)
Basophils Relative: 0 %
Eosinophils Absolute: 0.2 10*3/uL (ref 0.0–0.5)
Eosinophils Relative: 2 %
HCT: 40.3 % (ref 39.0–52.0)
Hemoglobin: 13.3 g/dL (ref 13.0–17.0)
Immature Granulocytes: 0 %
Lymphocytes Relative: 22 %
Lymphs Abs: 2.2 10*3/uL (ref 0.7–4.0)
MCH: 30.6 pg (ref 26.0–34.0)
MCHC: 33 g/dL (ref 30.0–36.0)
MCV: 92.9 fL (ref 80.0–100.0)
Monocytes Absolute: 0.7 10*3/uL (ref 0.1–1.0)
Monocytes Relative: 7 %
Neutro Abs: 6.7 10*3/uL (ref 1.7–7.7)
Neutrophils Relative %: 69 %
Platelets: 197 10*3/uL (ref 150–400)
RBC: 4.34 MIL/uL (ref 4.22–5.81)
RDW: 12.8 % (ref 11.5–15.5)
WBC: 9.8 10*3/uL (ref 4.0–10.5)
nRBC: 0 % (ref 0.0–0.2)

## 2023-06-24 LAB — BASIC METABOLIC PANEL WITH GFR
Anion gap: 9 (ref 5–15)
BUN: 22 mg/dL (ref 8–23)
CO2: 23 mmol/L (ref 22–32)
Calcium: 9.3 mg/dL (ref 8.9–10.3)
Chloride: 107 mmol/L (ref 98–111)
Creatinine, Ser: 0.78 mg/dL (ref 0.61–1.24)
GFR, Estimated: >60 mL/min (ref >60)
Glucose, Bld: 131 mg/dL — ABNORMAL HIGH (ref 70–99)
Potassium: 4.2 mmol/L (ref 3.5–5.1)
Sodium: 139 mmol/L (ref 135–145)

## 2023-06-24 MED ORDER — METHOCARBAMOL 500 MG PO TABS
500.0000 mg | ORAL_TABLET | Freq: Once | ORAL | Status: AC
  Administered 2023-06-24: 500 mg via ORAL
  Filled 2023-06-24: qty 1

## 2023-06-24 MED ORDER — HYDROCODONE-ACETAMINOPHEN 5-325 MG PO TABS
1.0000 | ORAL_TABLET | ORAL | Status: DC | PRN
  Administered 2023-06-24 – 2023-06-26 (×6): 1 via ORAL
  Filled 2023-06-24 (×6): qty 1

## 2023-06-24 MED ORDER — MELATONIN 5 MG PO TABS: 5.0000 mg | ORAL_TABLET | Freq: Every evening | ORAL | Status: DC | PRN

## 2023-06-24 MED ORDER — GABAPENTIN 100 MG PO CAPS
100.0000 mg | ORAL_CAPSULE | Freq: Once | ORAL | Status: AC
  Administered 2023-06-24: 100 mg via ORAL
  Filled 2023-06-24: qty 1

## 2023-06-24 MED ORDER — OXYCODONE-ACETAMINOPHEN 5-325 MG PO TABS
2.0000 | ORAL_TABLET | Freq: Once | ORAL | Status: AC
  Administered 2023-06-24: 2 via ORAL
  Filled 2023-06-24: qty 2

## 2023-06-24 MED ORDER — PROPRANOLOL HCL 40 MG PO TABS
40.0000 mg | ORAL_TABLET | Freq: Every day | ORAL | Status: DC
  Administered 2023-06-24 – 2023-06-29 (×6): 40 mg via ORAL
  Filled 2023-06-24 (×6): qty 1

## 2023-06-24 MED ORDER — METHOCARBAMOL 500 MG PO TABS: 500.0000 mg | ORAL_TABLET | Freq: Once | ORAL | Status: DC

## 2023-06-24 MED ORDER — HYDROMORPHONE HCL 1 MG/ML IJ SOLN: 0.5000 mg | INTRAMUSCULAR | Status: DC | PRN

## 2023-06-24 MED ORDER — SODIUM CHLORIDE 0.9% FLUSH
3.0000 mL | Freq: Two times a day (BID) | INTRAVENOUS | Status: DC
  Administered 2023-06-24 – 2023-06-29 (×11): 3 mL via INTRAVENOUS

## 2023-06-24 MED ORDER — DIAZEPAM 5 MG/ML IJ SOLN
2.5000 mg | Freq: Once | INTRAMUSCULAR | Status: AC
  Administered 2023-06-24: 2.5 mg via INTRAVENOUS
  Filled 2023-06-24: qty 2

## 2023-06-24 MED ORDER — METHYLPREDNISOLONE SODIUM SUCC 125 MG IJ SOLR
125.0000 mg | Freq: Once | INTRAMUSCULAR | Status: AC
  Administered 2023-06-24: 125 mg via INTRAVENOUS
  Filled 2023-06-24: qty 2

## 2023-06-24 MED ORDER — CYCLOBENZAPRINE HCL 10 MG PO TABS
10.0000 mg | ORAL_TABLET | Freq: Two times a day (BID) | ORAL | 0 refills | Status: DC | PRN
Start: 2023-06-24 — End: 2023-10-01

## 2023-06-24 MED ORDER — HYDROMORPHONE HCL 1 MG/ML IJ SOLN
1.0000 mg | Freq: Once | INTRAMUSCULAR | Status: AC
  Administered 2023-06-24: 1 mg via INTRAVENOUS
  Filled 2023-06-24: qty 1

## 2023-06-24 MED ORDER — GABAPENTIN 100 MG PO CAPS: 100.0000 mg | ORAL_CAPSULE | Freq: Three times a day (TID) | ORAL | 0 refills | Status: DC

## 2023-06-24 MED ORDER — LISINOPRIL 20 MG PO TABS
40.0000 mg | ORAL_TABLET | Freq: Every day | ORAL | Status: DC
  Administered 2023-06-25 – 2023-06-29 (×5): 40 mg via ORAL
  Filled 2023-06-24 (×5): qty 2

## 2023-06-24 MED ORDER — ASPIRIN 81 MG PO TBEC
81.0000 mg | DELAYED_RELEASE_TABLET | Freq: Every day | ORAL | Status: DC
  Administered 2023-06-25 – 2023-06-26 (×2): 81 mg via ORAL
  Filled 2023-06-24 (×2): qty 1

## 2023-06-24 MED ORDER — ACETAMINOPHEN 325 MG PO TABS: 650.0000 mg | ORAL_TABLET | Freq: Four times a day (QID) | ORAL | Status: DC | PRN

## 2023-06-24 MED ORDER — ATORVASTATIN CALCIUM 80 MG PO TABS
80.0000 mg | ORAL_TABLET | Freq: Every day | ORAL | Status: DC
  Administered 2023-06-24 – 2023-06-28 (×5): 80 mg via ORAL
  Filled 2023-06-24 (×5): qty 1

## 2023-06-24 MED ORDER — ALPRAZOLAM 0.5 MG PO TABS: 0.5000 mg | ORAL_TABLET | Freq: Every evening | ORAL | Status: DC | PRN

## 2023-06-24 MED ORDER — POLYETHYLENE GLYCOL 3350 17 G PO PACK: 17.0000 g | PACK | Freq: Every day | ORAL | Status: DC | PRN

## 2023-06-24 MED ORDER — METHOCARBAMOL 500 MG PO TABS
500.0000 mg | ORAL_TABLET | Freq: Four times a day (QID) | ORAL | Status: DC | PRN
  Administered 2023-06-24: 500 mg via ORAL
  Filled 2023-06-24: qty 1

## 2023-06-24 MED ORDER — AMLODIPINE BESYLATE 10 MG PO TABS
10.0000 mg | ORAL_TABLET | Freq: Every day | ORAL | Status: DC
  Administered 2023-06-24 – 2023-06-29 (×6): 10 mg via ORAL
  Filled 2023-06-24 (×7): qty 1

## 2023-06-24 MED ORDER — INSULIN ASPART 100 UNIT/ML IJ SOLN
0.0000 [IU] | Freq: Three times a day (TID) | INTRAMUSCULAR | Status: DC
  Administered 2023-06-24 – 2023-06-25 (×2): 2 [IU] via SUBCUTANEOUS
  Administered 2023-06-25 (×2): 1 [IU] via SUBCUTANEOUS
  Administered 2023-06-26: 5 [IU] via SUBCUTANEOUS
  Administered 2023-06-26: 1 [IU] via SUBCUTANEOUS
  Administered 2023-06-26 – 2023-06-27 (×2): 2 [IU] via SUBCUTANEOUS
  Administered 2023-06-27: 3 [IU] via SUBCUTANEOUS
  Administered 2023-06-28 (×2): 1 [IU] via SUBCUTANEOUS

## 2023-06-24 MED ORDER — HYDROMORPHONE HCL 1 MG/ML IJ SOLN
0.5000 mg | INTRAMUSCULAR | Status: DC | PRN
  Administered 2023-06-24 – 2023-06-26 (×12): 1 mg via INTRAVENOUS
  Filled 2023-06-24 (×12): qty 1

## 2023-06-24 MED ORDER — KETOROLAC TROMETHAMINE 30 MG/ML IJ SOLN
15.0000 mg | Freq: Once | INTRAMUSCULAR | Status: AC
  Administered 2023-06-24: 15 mg via INTRAVENOUS
  Filled 2023-06-24: qty 1

## 2023-06-24 MED ORDER — ACETAMINOPHEN 650 MG RE SUPP: 650.0000 mg | Freq: Four times a day (QID) | RECTAL | Status: DC | PRN

## 2023-06-24 MED ORDER — PANTOPRAZOLE SODIUM 40 MG PO TBEC
40.0000 mg | DELAYED_RELEASE_TABLET | Freq: Every day | ORAL | Status: DC
  Administered 2023-06-24 – 2023-06-29 (×6): 40 mg via ORAL
  Filled 2023-06-24: qty 2
  Filled 2023-06-24: qty 1
  Filled 2023-06-24 (×2): qty 2
  Filled 2023-06-24 (×2): qty 1
  Filled 2023-06-24: qty 2

## 2023-06-24 MED ORDER — METHOCARBAMOL 500 MG PO TABS
500.0000 mg | ORAL_TABLET | Freq: Four times a day (QID) | ORAL | Status: DC
  Administered 2023-06-24 – 2023-06-29 (×20): 500 mg via ORAL
  Filled 2023-06-24 (×20): qty 1

## 2023-06-24 MED ORDER — ALLOPURINOL 300 MG PO TABS
300.0000 mg | ORAL_TABLET | Freq: Every day | ORAL | Status: DC
  Administered 2023-06-25 – 2023-06-29 (×5): 300 mg via ORAL
  Filled 2023-06-24 (×5): qty 1

## 2023-06-24 NOTE — Plan of Care (Signed)

## 2023-06-24 NOTE — Progress Notes (Signed)
 22:23 I got call from tele to connect pt on Tele box , done verified with August Leavens

## 2023-06-24 NOTE — ED Provider Notes (Signed)
 Care was taken over from Dr. Luberta Ruse.  Patient presents with low back and hip pain.  He has a history of chronic back pain.  He is followed by spine center who manages his pain with [facility].  No recent trauma.  MRI shows some increased disc bulging in his lumbar spine.  There is also some degenerative changes in his left hip.  Patient was given multiple medications for pain control in the ED but still advises his pain is near a 10 out of 10 and he is not able to ambulate.  Discussed with Dr. Lamon Pillow who will see the patient.  Discussed with Dr. Rufina Cough he will admit the patient for pain management.  Dr. Lamon Pillow has seen the patient and advises that patient likely will need an Ortho consult as he thinks the pain may be more coming from his hip.  Notifed Dr. Melvin.   Hershel Los, MD 06/24/23 1310

## 2023-06-24 NOTE — ED Notes (Signed)
 Patient transported to MRI
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
## 2023-06-25 DIAGNOSIS — M109 Gout, unspecified: Secondary | ICD-10-CM | POA: Diagnosis present

## 2023-06-25 DIAGNOSIS — I2510 Atherosclerotic heart disease of native coronary artery without angina pectoris: Secondary | ICD-10-CM | POA: Diagnosis present

## 2023-06-25 DIAGNOSIS — Z87442 Personal history of urinary calculi: Secondary | ICD-10-CM | POA: Diagnosis not present

## 2023-06-25 DIAGNOSIS — M5116 Intervertebral disc disorders with radiculopathy, lumbar region: Secondary | ICD-10-CM | POA: Diagnosis present

## 2023-06-25 DIAGNOSIS — M5117 Intervertebral disc disorders with radiculopathy, lumbosacral region: Secondary | ICD-10-CM | POA: Diagnosis present

## 2023-06-25 DIAGNOSIS — E7800 Pure hypercholesterolemia, unspecified: Secondary | ICD-10-CM | POA: Diagnosis present

## 2023-06-25 DIAGNOSIS — M5432 Sciatica, left side: Secondary | ICD-10-CM | POA: Diagnosis present

## 2023-06-25 DIAGNOSIS — K219 Gastro-esophageal reflux disease without esophagitis: Secondary | ICD-10-CM | POA: Diagnosis present

## 2023-06-25 DIAGNOSIS — M84459A Pathological fracture, hip, unspecified, initial encounter for fracture: Secondary | ICD-10-CM | POA: Diagnosis present

## 2023-06-25 DIAGNOSIS — G894 Chronic pain syndrome: Secondary | ICD-10-CM | POA: Diagnosis present

## 2023-06-25 DIAGNOSIS — E66812 Obesity, class 2: Secondary | ICD-10-CM | POA: Diagnosis present

## 2023-06-25 DIAGNOSIS — E119 Type 2 diabetes mellitus without complications: Secondary | ICD-10-CM | POA: Diagnosis not present

## 2023-06-25 DIAGNOSIS — M5416 Radiculopathy, lumbar region: Secondary | ICD-10-CM | POA: Diagnosis not present

## 2023-06-25 DIAGNOSIS — Z8546 Personal history of malignant neoplasm of prostate: Secondary | ICD-10-CM | POA: Diagnosis not present

## 2023-06-25 DIAGNOSIS — I152 Hypertension secondary to endocrine disorders: Secondary | ICD-10-CM | POA: Diagnosis present

## 2023-06-25 DIAGNOSIS — M5126 Other intervertebral disc displacement, lumbar region: Secondary | ICD-10-CM | POA: Diagnosis present

## 2023-06-25 DIAGNOSIS — M25552 Pain in left hip: Secondary | ICD-10-CM | POA: Diagnosis present

## 2023-06-25 DIAGNOSIS — M1612 Unilateral primary osteoarthritis, left hip: Secondary | ICD-10-CM | POA: Diagnosis present

## 2023-06-25 DIAGNOSIS — Z833 Family history of diabetes mellitus: Secondary | ICD-10-CM | POA: Diagnosis not present

## 2023-06-25 DIAGNOSIS — E669 Obesity, unspecified: Secondary | ICD-10-CM

## 2023-06-25 DIAGNOSIS — Z8249 Family history of ischemic heart disease and other diseases of the circulatory system: Secondary | ICD-10-CM | POA: Diagnosis not present

## 2023-06-25 DIAGNOSIS — Z6837 Body mass index (BMI) 37.0-37.9, adult: Secondary | ICD-10-CM | POA: Diagnosis not present

## 2023-06-25 DIAGNOSIS — E1159 Type 2 diabetes mellitus with other circulatory complications: Secondary | ICD-10-CM | POA: Diagnosis present

## 2023-06-25 DIAGNOSIS — Z85828 Personal history of other malignant neoplasm of skin: Secondary | ICD-10-CM | POA: Diagnosis not present

## 2023-06-25 DIAGNOSIS — F419 Anxiety disorder, unspecified: Secondary | ICD-10-CM | POA: Diagnosis present

## 2023-06-25 DIAGNOSIS — F32A Depression, unspecified: Secondary | ICD-10-CM | POA: Diagnosis present

## 2023-06-25 DIAGNOSIS — Z7984 Long term (current) use of oral hypoglycemic drugs: Secondary | ICD-10-CM | POA: Diagnosis not present

## 2023-06-25 LAB — COMPREHENSIVE METABOLIC PANEL WITH GFR
ALT: 34 U/L (ref 0–44)
AST: 31 U/L (ref 15–41)
Albumin: 3.7 g/dL (ref 3.5–5.0)
Alkaline Phosphatase: 63 U/L (ref 38–126)
Anion gap: 11 (ref 5–15)
BUN: 33 mg/dL — ABNORMAL HIGH (ref 8–23)
CO2: 22 mmol/L (ref 22–32)
Calcium: 9 mg/dL (ref 8.9–10.3)
Chloride: 104 mmol/L (ref 98–111)
Creatinine, Ser: 1 mg/dL (ref 0.61–1.24)
GFR, Estimated: >60 mL/min (ref >60)
Glucose, Bld: 126 mg/dL — ABNORMAL HIGH (ref 70–99)
Potassium: 4.3 mmol/L (ref 3.5–5.1)
Sodium: 137 mmol/L (ref 135–145)
Total Bilirubin: 1.1 mg/dL (ref 0.0–1.2)
Total Protein: 6.6 g/dL (ref 6.5–8.1)

## 2023-06-25 LAB — CBC
HCT: 38.9 % — ABNORMAL LOW (ref 39.0–52.0)
Hemoglobin: 13.3 g/dL (ref 13.0–17.0)
MCH: 31.4 pg (ref 26.0–34.0)
MCHC: 34.2 g/dL (ref 30.0–36.0)
MCV: 91.7 fL (ref 80.0–100.0)
Platelets: 246 10*3/uL (ref 150–400)
RBC: 4.24 MIL/uL (ref 4.22–5.81)
RDW: 12.8 % (ref 11.5–15.5)
WBC: 17.9 10*3/uL — ABNORMAL HIGH (ref 4.0–10.5)
nRBC: 0 % (ref 0.0–0.2)

## 2023-06-25 LAB — GLUCOSE, CAPILLARY
Glucose-Capillary: 125 mg/dL — ABNORMAL HIGH (ref 70–99)
Glucose-Capillary: 148 mg/dL — ABNORMAL HIGH (ref 70–99)
Glucose-Capillary: 169 mg/dL — ABNORMAL HIGH (ref 70–99)
Glucose-Capillary: 178 mg/dL — ABNORMAL HIGH (ref 70–99)

## 2023-06-25 MED ORDER — FENTANYL 25 MCG/HR TD PT72
1.0000 | MEDICATED_PATCH | TRANSDERMAL | Status: DC
  Administered 2023-06-25 – 2023-06-27 (×2): 1 via TRANSDERMAL
  Filled 2023-06-25 (×2): qty 1

## 2023-06-25 MED ORDER — ENOXAPARIN SODIUM 60 MG/0.6ML IJ SOSY
60.0000 mg | PREFILLED_SYRINGE | INTRAMUSCULAR | Status: DC
  Administered 2023-06-25 – 2023-06-29 (×5): 60 mg via SUBCUTANEOUS
  Filled 2023-06-25 (×5): qty 0.6

## 2023-06-25 MED ORDER — METHYLPREDNISOLONE SODIUM SUCC 125 MG IJ SOLR
60.0000 mg | Freq: Every day | INTRAMUSCULAR | Status: DC
  Administered 2023-06-25 – 2023-06-26 (×2): 60 mg via INTRAVENOUS
  Filled 2023-06-25 (×2): qty 2

## 2023-06-25 MED ORDER — ZOLPIDEM TARTRATE 5 MG PO TABS
5.0000 mg | ORAL_TABLET | Freq: Every evening | ORAL | Status: DC | PRN
  Administered 2023-06-28: 5 mg via ORAL
  Filled 2023-06-25: qty 1

## 2023-06-25 NOTE — Plan of Care (Signed)

## 2023-06-25 NOTE — Progress Notes (Signed)
 Subjective: Patient reports some part of the pain going down to facet joints in the anterior compartment hip  Objective: Vital signs in last 24 hours: Temp:  [97.8 F (36.6 C)-98.3 F (36.8 C)] 97.8 F (36.6 C) (06/05 0736) Pulse Rate:  [66-106] 66 (06/05 0736) Resp:  [13-26] 20 (06/05 0736) BP: (126-177)/(73-116) 142/73 (06/05 0736) SpO2:  [95 %-100 %] 99 % (06/05 0736)  Intake/Output from previous day: 06/04 0701 - 06/05 0700 In: 1300 [P.O.:1300] Out: 200 [Urine:200] Intake/Output this shift: No intake/output data recorded.  Strength is 5 out of 5 weakness of foot drop.  Lab Results: Recent Labs    06/24/23 0238 06/25/23 0645  WBC 9.8 17.9*  HGB 13.3 13.3  HCT 40.3 38.9*  PLT 197 246   BMET Recent Labs    06/24/23 0238  NA 139  K 4.2  CL 107  CO2 23  GLUCOSE 131*  BUN 22  CREATININE 0.78  CALCIUM  9.3    Studies/Results: MR HIP LEFT WO CONTRAST Result Date: 06/24/2023 CLINICAL DATA:  Left hip pain. EXAM: MR OF THE LEFT HIP WITHOUT CONTRAST TECHNIQUE: Multiplanar, multisequence MR imaging was performed. No intravenous contrast was administered. COMPARISON:  Same day CT of the left hip dated 06/24/2023 at 3:42 a.m. FINDINGS: Soft tissue and Muscle: There is no obvious soft tissue swelling. Muscle bulk is relatively symmetric bilaterally. Hamstring tendon origins are intact. Gluteal cuff insertions are intact. Stable chronic/benign 4.2 cm cystic lesion in the left pelvis. No enlarged lymph nodes identified in the field of view. Bones/ Hip: Mild-to-moderate degenerative changes of the left hip with joint space narrowing and marginal osteophytosis. There is T1 hypointense somewhat curvilinear signal with corresponding T2 hyperintense signal at the left superior acetabulum, which could reflect degenerative subchondral cystic change/edema versus subchondral insufficiency fracture. No evidence of cortical breakthrough or extraosseous soft tissue extension. No joint effusion.  Labrum is grossly intact, although suboptimally evaluated due to lack of intra-articular fluid/contrast ligamentum teres and transverse ligaments are intact. The remainder of the bone marrow signal intensity is within normal limits. Mild subchondral marrow edema along the right superolateral acetabulum. The sacroiliac joints and pubic symphysis are anatomically aligned. Degenerative changes of the visualized lower lumbar spine. 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 No evidence of cortical breakthrough or extraosseous soft tissue extension, however, underlying lesion cannot be entirely excluded and follow-up or correlation with bone scan or PET-CT could be considered if clinically warranted. Electronically Signed   By: Mannie Seek M.D.   On: 06/24/2023 10:20   MR LUMBAR SPINE WO CONTRAST Result Date: 06/24/2023 EXAM: MRI LUMBAR SPINE 06/24/2023 06:08:00 AM TECHNIQUE: Multiplanar multisequence MRI of the lumbar spine was performed without the administration of intravenous contrast. COMPARISON: CT of the lumbar spine 06/24/2023. MRI of the lumbar spine 11/27/2022. CLINICAL HISTORY: Compression fracture, lumbar. FINDINGS: BONES AND ALIGNMENT: Remote superior endplate compression fractures at L2 and L4 are stable. No acute fractures are present. SPINAL CORD: The conus medullaris terminates at L1, within normal limits. SOFT TISSUES: No paraspinal mass. L1-L2: No significant disc herniation. No spinal canal stenosis or neural foraminal narrowing. L2-L3: A broad-based disc protrusion is present. Mild facet hypertrophy is present bilaterally. Mild foraminal narrowing present bilaterally, stable. L3-L4: A leftward disc protrusion is again seen. Mild left foraminal narrowing is stable. L4-L5: A progressive leftward disc protrusion is present.  Moderate facet hypertrophy has progressed bilaterally. Moderate-to-severe  foraminal narrowing, left greater than right, has progressed. L5-S1: A broad-based disc protrusion is present. Advanced facet hypertrophy has progressed bilaterally. Moderate right and mild left subarticular stenosis. IMPRESSION: 1. Stable remote superior endplate compression fractures at L2 and L4. No acute fractures. 2. Progressive leftward disc protrusion at L4-5 with moderate-to-severe foraminal narrowing, left greater than right, and moderate facet hypertrophy bilaterally. 3. Broad-based disc protrusion at L5-S1 with advanced facet hypertrophy bilaterally and moderate right and mild left subarticular foraminal narrowing. Electronically signed by: Audree Leas MD 06/24/2023 06:22 AM EDT RP Workstation: [id number]   CT Hip Left Wo Contrast Result Date: 06/24/2023 CLINICAL DATA:  Left hip pain EXAM: CT OF THE LEFT HIP WITHOUT CONTRAST TECHNIQUE: Multidetector CT imaging of the left hip was performed according to the standard protocol. Multiplanar CT image reconstructions were also generated. RADIATION DOSE REDUCTION: This exam was performed according to the departmental dose-optimization program which includes automated exposure control, adjustment of the mA and/or kV according to patient size and/or use of iterative reconstruction technique. COMPARISON:  Left hip radiographs dated 06/23/2023 FINDINGS: No fracture or dislocation is seen. Visualized bony pelvis appears intact. Left hip joint space is preserved. Calcified cystic left pelvic lesion measuring 4.2 cm (image 1), chronic/benign. IMPRESSION: No fracture or dislocation is seen. Electronically Signed   By: Zadie Herter M.D.   On: 06/24/2023 03:57   CT Lumbar Spine Wo Contrast Result Date: 06/24/2023 CLINICAL DATA:  Lumbar compression fracture EXAM: CT LUMBAR SPINE WITHOUT CONTRAST TECHNIQUE: Multidetector CT imaging of the lumbar spine was performed without intravenous  contrast administration. Multiplanar CT image reconstructions were also generated. RADIATION DOSE REDUCTION: This exam was performed according to the departmental dose-optimization program which includes automated exposure control, adjustment of the mA and/or kV according to patient size and/or use of iterative reconstruction technique. COMPARISON:  CTA chest abdomen pelvis dated 06/30/2022 FINDINGS: Segmentation: 5 lumbar type vertebral bodies. Alignment: Normal lumbar lordosis. Vertebrae: Moderate superior endplate compression fracture deformity at L2 (sagittal image 40), chronic. Vertebral body heights are otherwise maintained. Paraspinal and other soft tissues: Vascular calcifications. Rim calcified lesion the left pelvis (image 147), incompletely visualized, but chronic/benign. Disc levels: Moderate multilevel degenerative changes of the visualized thoracolumbar spine. Spinal canal is patent. IMPRESSION: Moderate superior endplate compression fracture deformity at L2, chronic. No acute fracture. Moderate multilevel degenerative changes. Electronically Signed   By: Zadie Herter M.D.   On: 06/24/2023 03:56   DG Hip Unilat W or Wo Pelvis 2-3 Views Left Result Date: 06/23/2023 EXAM: 3 VIEW(S) XRAY OF THE LEFT HIP 06/23/2023 09:39:00 PM COMPARISON: None available. CLINICAL HISTORY: Hip pain. Pt states he got up from the dining room table around 630 tonight and felt pain in his left hip that has not gone away. FINDINGS: BONES AND JOINTS: No acute fracture or focal osseous lesion. The hip joint is maintained. No significant degenerative changes. SOFT TISSUES: The soft tissues are unremarkable. IMPRESSION: 1. No acute findings. Electronically signed by: Zadie Herter MD 06/23/2023 09:41 PM EDT RP Workstation: [id number]    Assessment/Plan: Hospital day one 63 year old gentleman with severe I do think there may be a small component of this that is radicular however I think the vast majority of his  intrinsic hip joint itself.  Consideration if diabetes is stable IV steroids to help with potential small component of radiculopathy  LOS: 0 days     Ferris Hua 06/25/2023, 7:43 AM

## 2023-06-25 NOTE — Progress Notes (Signed)
 Progress Note   Patient: David Hartman BJY:[id number] DOB: [birth year] DOA: 06/23/2023     0 DOS: the patient was seen and examined on 06/25/2023   Brief hospital course: David Hartman is a 63 y.o. male with medical history significant of hypertension, hyperlipidemia, diabetes, GERD, CAD, obesity, gout, depression, anxiety OC cancer, chronic pain presenting with severe hip pain radiating down his leg.   CT of the L-spine which showed chronic pression fracture of L2 and CT of the left hip which showed no acute normality. MRI of the L-spine showed stable L2 L4 compression fractures with progressive disc protrusion at L4-L5 and moderate to severe foraminal narrowing in this area.  Also noted was L5-S1 disc protrusion with mild to moderate foraminal narrowing.  MRI of the left hip showed osteoarthritis and changes favoring subchondral insufficiency fracture versus degenerative changes without evidence of cortical breakthrough.  Patient is admitted to [facility] service for further pain management evaluation.    Assessment and Plan: Intractable left hip pain Patient with sudden onset of pain after standing up from dinner last night. Imaging showing progressive spinal disease specially at L4-L5 as well as unclear changes of the left hip which could represent subchondral insufficiency fracture versus other with inability to fully exclude a lesion and possible PET imaging needed in follow-up. Neurosurgery consulted advised steroids, pain control, no surgical intervention.  Started IV Solu-Medrol  60 mg daily. Continue with Tylenol  for mild pain, hydrocodone  for moderate to severe pain, Dilaudid  for severe breakthrough pain. Continue as needed Robaxin . Called orthopedics who advised non weight bearing at this time.  Hypertension Continue home dose amlodipine , lisinopril , propranolol    Hyperlipidemia Continue atorvastatin    Type 2 Diabetes mellitus Continue Accu-Cheks, sliding scale insulin. Steroids  may worsen his blood sugars.  Hold metformin  therapy.   GERD Continue home PPI.   CAD Continue ASA, atorvastatin .   Gout Continue home allopurinol .   Depression Anxiety Continue Xanax .  Obesity class II BMI 37.31 Diet, exercise and weight reduction advised.      Out of bed to chair. Incentive spirometry. Nursing supportive care. Fall, aspiration precautions. Diet:  Diet Orders (From admission, onward)     Start     Ordered   06/24/23 1333  Diet regular Fluid consistency: Thin  Diet effective now       Question:  Fluid consistency:  Answer:  Thin   06/24/23 1332           DVT prophylaxis: SCDs Start: 06/24/23 1235  Level of care: Med-Surg   Code Status: Full Code  Subjective: Patient is seen and examined today morning.  States his pain did not improve even with IV opiates.  Patient states pain worse with lying down.  Able to get out of bed.  Eating fair.  Physical Exam: Vitals:   06/24/23 1939 06/24/23 2354 06/25/23 0414 06/25/23 0736  BP: (!) 141/87 126/78 134/75 (!) 142/73  Pulse: 83 67 68 66  Resp: 18 17 17 20   Temp: 98.3 F (36.8 C) 98.1 F (36.7 C) 97.9 F (36.6 C) 97.8 F (36.6 C)  TempSrc: Oral  Oral   SpO2: 96% 97% 97% 99%  Weight:      Height:        General - Elderly obese Caucasian male, distress due to pain HEENT - PERRLA, EOMI, atraumatic head, non tender sinuses. Lung - Clear, no rales, rhonchi, wheezes. Heart - S1, S2 heard, no murmurs, rubs, trace pedal edema. Abdomen - Soft, non tender, obese, bowel sounds good  Neuro - Alert, awake and oriented x 3, non focal exam. Skin - Warm and dry.  Data Reviewed:      Latest Ref Rng & Units 06/25/2023    6:45 AM 06/24/2023    2:38 AM 03/25/2023    8:06 AM  CBC  WBC 4.0 - 10.5 K/uL 17.9  9.8  8.1   Hemoglobin 13.0 - 17.0 g/dL 16.1  09.6  04.5   Hematocrit 39.0 - 52.0 % 38.9  40.3  40.0   Platelets 150 - 400 K/uL 246  197  203       Latest Ref Rng & Units 06/25/2023    6:45 AM 06/24/2023     2:38 AM 03/25/2023    8:06 AM  BMP  Glucose 70 - 99 mg/dL 409  811  914   BUN 8 - 23 mg/dL 33  22  16   Creatinine 0.61 - 1.24 mg/dL 7.82  9.56  2.13   BUN/Creat Ratio 6 - 22 (calc)   SEE NOTE:   Sodium 135 - 145 mmol/L 137  139  140   Potassium 3.5 - 5.1 mmol/L 4.3  4.2  4.0   Chloride 98 - 111 mmol/L 104  107  106   CO2 22 - 32 mmol/L 22  23  26    Calcium  8.9 - 10.3 mg/dL 9.0  9.3  8.9    MR HIP LEFT WO CONTRAST Result Date: 06/24/2023 CLINICAL DATA:  Left hip pain. EXAM: MR OF THE LEFT HIP WITHOUT CONTRAST TECHNIQUE: Multiplanar, multisequence MR imaging was performed. No intravenous contrast was administered. COMPARISON:  Same day CT of the left hip dated 06/24/2023 at 3:42 a.m. FINDINGS: Soft tissue and Muscle: There is no obvious soft tissue swelling. Muscle bulk is relatively symmetric bilaterally. Hamstring tendon origins are intact. Gluteal cuff insertions are intact. Stable chronic/benign 4.2 cm cystic lesion in the left pelvis. No enlarged lymph nodes identified in the field of view. Bones/ Hip: Mild-to-moderate degenerative changes of the left hip with joint space narrowing and marginal osteophytosis. There is T1 hypointense somewhat curvilinear signal with corresponding T2 hyperintense signal at the left superior acetabulum, which could reflect degenerative subchondral cystic change/edema versus subchondral insufficiency fracture. No evidence of cortical breakthrough or extraosseous soft tissue extension. No joint effusion. Labrum is grossly intact, although suboptimally evaluated due to lack of intra-articular fluid/contrast ligamentum teres and transverse ligaments are intact. The remainder of the bone marrow signal intensity is within normal limits. Mild subchondral marrow edema along the right superolateral acetabulum. The sacroiliac joints and pubic symphysis are anatomically aligned. Degenerative changes of the visualized lower lumbar spine. IMPRESSION: Mild-to-moderate  osteoarthritis of the left hip with findings favored to reflect subchondral insufficiency fracture versus degenerative subchondral cystic change/edema of the left superior acetabulum. No evidence of cortical breakthrough or extraosseous soft tissue extension, however, underlying lesion cannot be entirely excluded and follow-up or correlation with bone scan or PET-CT could be considered if clinically warranted. Electronically Signed   By: Mannie Seek M.D.   On: 06/24/2023 10:20   MR LUMBAR SPINE WO CONTRAST Result Date: 06/24/2023 EXAM: MRI LUMBAR SPINE 06/24/2023 06:08:00 AM TECHNIQUE: Multiplanar multisequence MRI of the lumbar spine was performed without the administration of intravenous contrast. COMPARISON: CT of the lumbar spine 06/24/2023. MRI of the lumbar spine 11/27/2022. CLINICAL HISTORY: Compression fracture, lumbar. FINDINGS: BONES AND ALIGNMENT: Remote superior endplate compression fractures at L2 and L4 are stable. No acute fractures are present. SPINAL CORD: The conus medullaris terminates at L1,  within normal limits. SOFT TISSUES: No paraspinal mass. L1-L2: No significant disc herniation. No spinal canal stenosis or neural foraminal narrowing. L2-L3: A broad-based disc protrusion is present. Mild facet hypertrophy is present bilaterally. Mild foraminal narrowing present bilaterally, stable. L3-L4: A leftward disc protrusion is again seen. Mild left foraminal narrowing is stable. L4-L5: A progressive leftward disc protrusion is present. Moderate facet hypertrophy has progressed bilaterally. Moderate-to-severe foraminal narrowing, left greater than right, has progressed. L5-S1: A broad-based disc protrusion is present. Advanced facet hypertrophy has progressed bilaterally. Moderate right and mild left subarticular stenosis. IMPRESSION: 1. Stable remote superior endplate compression fractures at L2 and L4. No acute fractures. 2. Progressive leftward disc protrusion at L4-5 with moderate-to-severe  foraminal narrowing, left greater than right, and moderate facet hypertrophy bilaterally. 3. Broad-based disc protrusion at L5-S1 with advanced facet hypertrophy bilaterally and moderate right and mild left subarticular foraminal narrowing. Electronically signed by: Audree Leas MD 06/24/2023 06:22 AM EDT RP Workstation: [id number]   CT Hip Left Wo Contrast Result Date: 06/24/2023 CLINICAL DATA:  Left hip pain EXAM: CT OF THE LEFT HIP WITHOUT CONTRAST TECHNIQUE: Multidetector CT imaging of the left hip was performed according to the standard protocol. Multiplanar CT image reconstructions were also generated. RADIATION DOSE REDUCTION: This exam was performed according to the departmental dose-optimization program which includes automated exposure control, adjustment of the mA and/or kV according to patient size and/or use of iterative reconstruction technique. COMPARISON:  Left hip radiographs dated 06/23/2023 FINDINGS: No fracture or dislocation is seen. Visualized bony pelvis appears intact. Left hip joint space is preserved. Calcified cystic left pelvic lesion measuring 4.2 cm (image 1), chronic/benign. IMPRESSION: No fracture or dislocation is seen. Electronically Signed   By: Zadie Herter M.D.   On: 06/24/2023 03:57   CT Lumbar Spine Wo Contrast Result Date: 06/24/2023 CLINICAL DATA:  Lumbar compression fracture EXAM: CT LUMBAR SPINE WITHOUT CONTRAST TECHNIQUE: Multidetector CT imaging of the lumbar spine was performed without intravenous contrast administration. Multiplanar CT image reconstructions were also generated. RADIATION DOSE REDUCTION: This exam was performed according to the departmental dose-optimization program which includes automated exposure control, adjustment of the mA and/or kV according to patient size and/or use of iterative reconstruction technique. COMPARISON:  CTA chest abdomen pelvis dated 06/30/2022 FINDINGS: Segmentation: 5 lumbar type vertebral bodies. Alignment:  Normal lumbar lordosis. Vertebrae: Moderate superior endplate compression fracture deformity at L2 (sagittal image 40), chronic. Vertebral body heights are otherwise maintained. Paraspinal and other soft tissues: Vascular calcifications. Rim calcified lesion the left pelvis (image 147), incompletely visualized, but chronic/benign. Disc levels: Moderate multilevel degenerative changes of the visualized thoracolumbar spine. Spinal canal is patent. IMPRESSION: Moderate superior endplate compression fracture deformity at L2, chronic. No acute fracture. Moderate multilevel degenerative changes. Electronically Signed   By: Zadie Herter M.D.   On: 06/24/2023 03:56   DG Hip Unilat W or Wo Pelvis 2-3 Views Left Result Date: 06/23/2023 EXAM: 3 VIEW(S) XRAY OF THE LEFT HIP 06/23/2023 09:39:00 PM COMPARISON: None available. CLINICAL HISTORY: Hip pain. Pt states he got up from the dining room table around 630 tonight and felt pain in his left hip that has not gone away. FINDINGS: BONES AND JOINTS: No acute fracture or focal osseous lesion. The hip joint is maintained. No significant degenerative changes. SOFT TISSUES: The soft tissues are unremarkable. IMPRESSION: 1. No acute findings. Electronically signed by: Zadie Herter MD 06/23/2023 09:41 PM EDT RP Workstation: [id number]    Family Communication: Discussed with patient, understand and agree. All questions  answered.  Disposition: Status is: Changed to inpatient Inpatient because of need for IV pain medications, steroids.  Planned Discharge Destination: Home     Time spent: 40 minutes  Author: Aisha Hove, MD 06/25/2023 12:51 PM Secure chat 7am to 7pm For on call review [email or web address].

## 2023-06-26 DIAGNOSIS — G894 Chronic pain syndrome: Secondary | ICD-10-CM | POA: Diagnosis not present

## 2023-06-26 DIAGNOSIS — E1159 Type 2 diabetes mellitus with other circulatory complications: Secondary | ICD-10-CM | POA: Diagnosis not present

## 2023-06-26 DIAGNOSIS — E119 Type 2 diabetes mellitus without complications: Secondary | ICD-10-CM | POA: Diagnosis not present

## 2023-06-26 DIAGNOSIS — Z8546 Personal history of malignant neoplasm of prostate: Secondary | ICD-10-CM | POA: Diagnosis not present

## 2023-06-26 LAB — GLUCOSE, CAPILLARY
Glucose-Capillary: 145 mg/dL — ABNORMAL HIGH (ref 70–99)
Glucose-Capillary: 163 mg/dL — ABNORMAL HIGH (ref 70–99)
Glucose-Capillary: 226 mg/dL — ABNORMAL HIGH (ref 70–99)
Glucose-Capillary: 169 mg/dL — ABNORMAL HIGH (ref 70–99)

## 2023-06-26 MED ORDER — ASPIRIN 81 MG PO TBEC
81.0000 mg | DELAYED_RELEASE_TABLET | Freq: Every day | ORAL | Status: DC
  Administered 2023-06-27 – 2023-06-29 (×3): 81 mg via ORAL
  Filled 2023-06-26 (×3): qty 1

## 2023-06-26 MED ORDER — HYDROMORPHONE HCL 2 MG PO TABS
2.0000 mg | ORAL_TABLET | ORAL | Status: DC | PRN
  Administered 2023-06-26 – 2023-06-29 (×13): 2 mg via ORAL
  Filled 2023-06-26 (×13): qty 1

## 2023-06-26 NOTE — Progress Notes (Signed)
 Orthopedic Tech Progress Note Patient Details:  David Hartman February 03, 1960 [id number]  Ortho Devices Type of Ortho Device: Crutches Ortho Device/Splint Interventions: Ordered, Application, Adjustment   Post Interventions Patient Tolerated: Well Instructions Provided: Adjustment of device, Care of device  Herbie Loll 06/26/2023, 5:20 PM
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
## 2023-06-26 NOTE — Progress Notes (Signed)
 Subjective: Patient reports severe left leg pain that starts in his anterior hip and radiates down his leg   Objective: Vital signs in last 24 hours: Temp:  [97.6 F (36.4 C)-98.4 F (36.9 C)] 97.6 F (36.4 C) (06/06 0743) Pulse Rate:  [65-72] 65 (06/06 0743) Resp:  [18-19] 19 (06/06 0743) BP: (131-140)/(74-86) 140/77 (06/06 0743) SpO2:  [96 %-98 %] 98 % (06/06 0743)  Intake/Output from previous day: 06/05 0701 - 06/06 0700 In: 1200 [P.O.:1200] Out: -  Intake/Output this shift: No intake/output data recorded.  Neurologic: Grossly normal  Lab Results: Lab Results  Component Value Date   WBC 17.9 (H) 06/25/2023   HGB 13.3 06/25/2023   HCT 38.9 (L) 06/25/2023   MCV 91.7 06/25/2023   PLT 246 06/25/2023   No results found for: "INR", "PROTIME" BMET Lab Results  Component Value Date   NA 137 06/25/2023   K 4.3 06/25/2023   CL 104 06/25/2023   CO2 22 06/25/2023   GLUCOSE 126 (H) 06/25/2023   BUN 33 (H) 06/25/2023   CREATININE 1.00 06/25/2023   CALCIUM  9.0 06/25/2023    Studies/Results: No results found.  Assessment/Plan: MRI lumbar shows a disc protrustion on the left at L4-5 which could be the source of his pain. However give the results of his hip mri, we cannot rule this out. Would suggest consulting ortho to have them weigh in. Can consider ESI on the left at L4-5   LOS: 1 day    Kenard Paul [facility] [place] [facility] 06/26/2023, 12:53 PM
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
## 2023-06-26 NOTE — Progress Notes (Signed)
 Progress Note   Patient: David Hartman QIH:[id number] DOB: Sep 13, 1960 DOA: 06/23/2023     1 DOS: the patient was seen and examined on 06/26/2023   Brief hospital course: David Hartman is a 63 y.o. male with medical history significant of hypertension, hyperlipidemia, diabetes, GERD, CAD, obesity, gout, depression, anxiety OC cancer, chronic pain presenting with severe hip pain radiating down his leg.   CT of the L-spine which showed chronic pression fracture of L2 and CT of the left hip which showed no acute normality. MRI of the L-spine showed stable L2 L4 compression fractures with progressive disc protrusion at L4-L5 and moderate to severe foraminal narrowing in this area.  Also noted was L5-S1 disc protrusion with mild to moderate foraminal narrowing.  MRI of the left hip showed osteoarthritis and changes favoring subchondral insufficiency fracture versus degenerative changes without evidence of cortical breakthrough.  Patient is admitted to TRH service for further pain management and evaluation.    Assessment and Plan: Intractable left hip pain Radiation to his leg Left hip subchondral insufficiency fracture Neurosurgery advised pain control, steroids, no surgical intervention.  Continue IV Solu-Medrol  60 mg daily. Given his significant pain advised epidural steroid injection left L3-L4. Orthopedic advised non weight bearing left leg. No intervention needed. (Consult requested) Continue with Tylenol  for mild pain, hydrocodone  for moderate to severe pain, Dilaudid  for severe breakthrough pain. He is also on fentanyl , K pad. Continue as needed Robaxin .  Hypertension Continue home dose amlodipine , lisinopril , propranolol    Hyperlipidemia Continue atorvastatin    Type 2 Diabetes mellitus Continue Accu-Cheks, sliding scale insulin. Steroids could worsen his blood sugars.  Hold metformin  therapy.   GERD Continue home PPI.   CAD Continue ASA, atorvastatin .   Gout Continue  home allopurinol .   Depression Anxiety Continue Xanax .  Obesity class II BMI 37.31 Diet, exercise and weight reduction advised.      Out of bed to chair. Incentive spirometry. Nursing supportive care. Fall, aspiration precautions. Diet:  Diet Orders (From admission, onward)     Start     Ordered   06/24/23 1333  Diet regular Fluid consistency: Thin  Diet effective now       Question:  Fluid consistency:  Answer:  Thin   06/24/23 1332           DVT prophylaxis: SCDs Start: 06/24/23 1235  Level of care: Med-Surg   Code Status: Full Code  Subjective: Patient is seen and examined today morning.  States his pain not better, could not sleep overnight. Eating fair. Asks if he can shower. Discussed with daughter over phone at bedside.  Physical Exam: Vitals:   06/25/23 1630 06/25/23 1942 06/26/23 0517 06/26/23 0743  BP: 135/86 131/74 138/74 (!) 140/77  Pulse: 68 68 72 65  Resp: 19 18 18 19   Temp: 97.9 F (36.6 C) 98.4 F (36.9 C) 97.9 F (36.6 C) 97.6 F (36.4 C)  TempSrc:  Oral Oral   SpO2: 97% 96% 96% 98%  Weight:      Height:        General - Elderly obese Caucasian male, in distress due to pain HEENT - PERRLA, EOMI, atraumatic head, non tender sinuses. Lung - Clear, no rales, rhonchi, wheezes. Heart - S1, S2 heard, no murmurs, rubs, trace pedal edema. Abdomen - Soft, non tender, obese, bowel sounds good Neuro - Alert, awake and oriented x 3, non focal exam. Skin - Warm and dry.  Data Reviewed:      Latest Ref Rng & Units  06/25/2023    6:45 AM 06/24/2023    2:38 AM 03/25/2023    8:06 AM  CBC  WBC 4.0 - 10.5 K/uL 17.9  9.8  8.1   Hemoglobin 13.0 - 17.0 g/dL 16.1  09.6  04.5   Hematocrit 39.0 - 52.0 % 38.9  40.3  40.0   Platelets 150 - 400 K/uL 246  197  203       Latest Ref Rng & Units 06/25/2023    6:45 AM 06/24/2023    2:38 AM 03/25/2023    8:06 AM  BMP  Glucose 70 - 99 mg/dL 409  811  914   BUN 8 - 23 mg/dL 33  22  16   Creatinine 0.61 - 1.24 mg/dL  7.82  9.56  2.13   BUN/Creat Ratio 6 - 22 (calc)   SEE NOTE:   Sodium 135 - 145 mmol/L 137  139  140   Potassium 3.5 - 5.1 mmol/L 4.3  4.2  4.0   Chloride 98 - 111 mmol/L 104  107  106   CO2 22 - 32 mmol/L 22  23  26    Calcium  8.9 - 10.3 mg/dL 9.0  9.3  8.9    No results found.   Family Communication: Discussed with patient at bedside, daughter over phone. They understand and agree. All questions answered.  Disposition: Status is: Changed to inpatient Inpatient because of need for IV pain medications, steroids. Need ESI lumbar spine.  Planned Discharge Destination: Home     Time spent: 39 minutes  Author: Aisha Hove, MD 06/26/2023 1:40 PM Secure chat 7am to 7pm For on call review [email or web address].

## 2023-06-27 DIAGNOSIS — E119 Type 2 diabetes mellitus without complications: Secondary | ICD-10-CM | POA: Diagnosis not present

## 2023-06-27 DIAGNOSIS — G894 Chronic pain syndrome: Secondary | ICD-10-CM | POA: Diagnosis not present

## 2023-06-27 DIAGNOSIS — Z8546 Personal history of malignant neoplasm of prostate: Secondary | ICD-10-CM | POA: Diagnosis not present

## 2023-06-27 DIAGNOSIS — E1159 Type 2 diabetes mellitus with other circulatory complications: Secondary | ICD-10-CM | POA: Diagnosis not present

## 2023-06-27 LAB — GLUCOSE, CAPILLARY
Glucose-Capillary: 108 mg/dL — ABNORMAL HIGH (ref 70–99)
Glucose-Capillary: 152 mg/dL — ABNORMAL HIGH (ref 70–99)
Glucose-Capillary: 193 mg/dL — ABNORMAL HIGH (ref 70–99)
Glucose-Capillary: 249 mg/dL — ABNORMAL HIGH (ref 70–99)
Glucose-Capillary: 129 mg/dL — ABNORMAL HIGH (ref 70–99)

## 2023-06-27 MED ORDER — METHYLPREDNISOLONE SODIUM SUCC 40 MG IJ SOLR
40.0000 mg | Freq: Every day | INTRAMUSCULAR | Status: AC
  Administered 2023-06-27: 40 mg via INTRAVENOUS
  Filled 2023-06-27: qty 1

## 2023-06-27 MED ORDER — GABAPENTIN 300 MG PO CAPS
300.0000 mg | ORAL_CAPSULE | Freq: Three times a day (TID) | ORAL | Status: DC
  Administered 2023-06-27 – 2023-06-29 (×7): 300 mg via ORAL
  Filled 2023-06-27 (×7): qty 1
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
## 2023-06-27 NOTE — Progress Notes (Signed)
 Progress Note   Patient: David Hartman UEA:[id number] DOB: [birth year] DOA: 06/23/2023     2 DOS: the patient was seen and examined on 06/27/2023   Brief hospital course: DENARIUS SESLER is a 63 y.o. male with medical history significant of hypertension, hyperlipidemia, diabetes, GERD, CAD, obesity, gout, depression, anxiety OC cancer, chronic pain presenting with severe hip pain radiating down his leg.   CT of the L-spine which showed chronic pression fracture of L2 and CT of the left hip which showed no acute normality. MRI of the L-spine showed stable L2 L4 compression fractures with progressive disc protrusion at L4-L5 and moderate to severe foraminal narrowing in this area.  Also noted was L5-S1 disc protrusion with mild to moderate foraminal narrowing.  MRI of the left hip showed osteoarthritis and changes favoring subchondral insufficiency fracture versus degenerative changes without evidence of cortical breakthrough.  Patient is admitted to TRH service for further pain management and evaluation.    Assessment and Plan: Intractable left hip pain Radiation to his leg Left hip subchondral insufficiency fracture Neurosurgery advised pain control, steroids, no surgical intervention, epidural steroid injection left L3-L4. Discussed with radiology - advised outpatient ESI given he has to be off of aspirin  for 5 days. Discussed with patient, daughter. Patient wishes to get pain under control, work with PT before discharge and outpatient ESI scheduled. Orthopedic advised non weight bearing left leg. No intervention needed. Added gabapentin . Outpatient ortho follow up.  PT to work with crutches, rolling walker. Continue with Tylenol  for mild pain, oral Dilaudid  q4 for severe pain, fentanyl  patch, gabapentin . He wishes to be off of IV pain meds..  Hypertension Continue home dose amlodipine , lisinopril , propranolol    Hyperlipidemia Continue atorvastatin    Type 2 Diabetes mellitus Continue  Accu-Cheks, sliding scale insulin . Steroids could worsen his blood sugars.  Hold metformin  therapy.   GERD Continue home PPI.   CAD Continue ASA, atorvastatin .   Gout Continue home allopurinol .   Depression Anxiety Continue Xanax .  Obesity class II BMI 37.31 Diet, exercise and weight reduction advised.      Out of bed to chair. Incentive spirometry. Nursing supportive care. Fall, aspiration precautions. Diet:  Diet Orders (From admission, onward)     Start     Ordered   06/24/23 1333  Diet regular Fluid consistency: Thin  Diet effective now       Question:  Fluid consistency:  Answer:  Thin   06/24/23 1332           DVT prophylaxis: SCDs Start: 06/24/23 1235  Level of care: Med-Surg   Code Status: Full Code  Subjective: Patient is seen and examined today morning.  States his pain not better, able to lay in bed. Took oral dilaudid . No nausea, constipation.  Physical Exam: Vitals:   06/26/23 1608 06/26/23 1914 06/27/23 0346 06/27/23 0742  BP: 125/75 132/76 (!) 149/81 (!) 161/91  Pulse: 60 (!) 54 (!) 58 76  Resp: 19 17 16 19   Temp: 97.6 F (36.4 C) 97.7 F (36.5 C) 97.8 F (36.6 C) (!) 97.5 F (36.4 C)  TempSrc:  Oral    SpO2: 99% 99% 98% 97%  Weight:      Height:        General - Elderly obese Caucasian male, in distress due to pain HEENT - PERRLA, EOMI, atraumatic head, non tender sinuses. Lung - Clear, no rales, rhonchi, wheezes. Heart - S1, S2 heard, no murmurs, rubs, trace pedal edema. Abdomen - Soft, non tender, obese, bowel  sounds good Neuro - Alert, awake and oriented x 3, non focal exam. Skin - Warm and dry.  Data Reviewed:      Latest Ref Rng & Units 06/25/2023    6:45 AM 06/24/2023    2:38 AM 03/25/2023    8:06 AM  CBC  WBC 4.0 - 10.5 K/uL 17.9  9.8  8.1   Hemoglobin 13.0 - 17.0 g/dL 16.1  09.6  04.5   Hematocrit 39.0 - 52.0 % 38.9  40.3  40.0   Platelets 150 - 400 K/uL 246  197  203       Latest Ref Rng & Units 06/25/2023     6:45 AM 06/24/2023    2:38 AM 03/25/2023    8:06 AM  BMP  Glucose 70 - 99 mg/dL 409  811  914   BUN 8 - 23 mg/dL 33  22  16   Creatinine 0.61 - 1.24 mg/dL 7.82  9.56  2.13   BUN/Creat Ratio 6 - 22 (calc)   SEE NOTE:   Sodium 135 - 145 mmol/L 137  139  140   Potassium 3.5 - 5.1 mmol/L 4.3  4.2  4.0   Chloride 98 - 111 mmol/L 104  107  106   CO2 22 - 32 mmol/L 22  23  26    Calcium  8.9 - 10.3 mg/dL 9.0  9.3  8.9    No results found.   Family Communication: Discussed with patient at bedside, he understand and agree. All questions answered.  Disposition: Status is: Changed to inpatient Inpatient because of need for pain control, PT/ OT, ESI lumbar spine.  Planned Discharge Destination: Home     Time spent: 38 minutes  Author: Aisha Hove, MD 06/27/2023 3:56 PM Secure chat 7am to 7pm For on call review [email or web address].

## 2023-06-27 NOTE — Progress Notes (Signed)
   Providing Compassionate, Quality Care - Together   Subjective: Patient reports severe pain still radiating down his LLE.  Objective: Vital signs in last 24 hours: Temp:  [97.5 F (36.4 C)-97.8 F (36.6 C)] 97.5 F (36.4 C) (06/07 0742) Pulse Rate:  [54-76] 76 (06/07 0742) Resp:  [16-19] 19 (06/07 0742) BP: (125-161)/(75-91) 161/91 (06/07 0742) SpO2:  [97 %-99 %] 97 % (06/07 0742)  Intake/Output from previous day: No intake/output data recorded. Intake/Output this shift: No intake/output data recorded.  Alert and oriented x 4 PERRLA CN II-XII grossly intact MAE, guarded movement of LLE due to pain   Lab Results: Recent Labs    06/25/23 0645  WBC 17.9*  HGB 13.3  HCT 38.9*  PLT 246   BMET Recent Labs    06/25/23 0645  NA 137  K 4.3  CL 104  CO2 22  GLUCOSE 126*  BUN 33*  CREATININE 1.00  CALCIUM  9.0    Studies/Results: No results found.  Assessment/Plan: Patient with left L4-5 disc protrusion and left hip insufficiency fracture.   LOS: 2 days   -Added gabapentin  to see if this improves his leg pain. -Plan is for outpatient lumbar ESI and outpatient follow up with ortho.  I am in communication with my attending and they agree with the plan for this patient.   Henreitta Locus, DNP, AGNP-C Nurse Practitioner  [facility] [address], [place], [place] [address] P: [phone]    F: [phone]  06/27/2023, 10:48 AM
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
## 2023-06-28 DIAGNOSIS — Z8546 Personal history of malignant neoplasm of prostate: Secondary | ICD-10-CM | POA: Diagnosis not present

## 2023-06-28 DIAGNOSIS — E119 Type 2 diabetes mellitus without complications: Secondary | ICD-10-CM | POA: Diagnosis not present

## 2023-06-28 DIAGNOSIS — G894 Chronic pain syndrome: Secondary | ICD-10-CM | POA: Diagnosis not present

## 2023-06-28 DIAGNOSIS — E1159 Type 2 diabetes mellitus with other circulatory complications: Secondary | ICD-10-CM | POA: Diagnosis not present

## 2023-06-28 LAB — GLUCOSE, CAPILLARY
Glucose-Capillary: 104 mg/dL — ABNORMAL HIGH (ref 70–99)
Glucose-Capillary: 136 mg/dL — ABNORMAL HIGH (ref 70–99)
Glucose-Capillary: 122 mg/dL — ABNORMAL HIGH (ref 70–99)
Glucose-Capillary: 119 mg/dL — ABNORMAL HIGH (ref 70–99)

## 2023-06-28 NOTE — Plan of Care (Signed)

## 2023-06-28 NOTE — Progress Notes (Signed)
 Progress Note   Patient: David Hartman ZOX:[id number] DOB: Apr 15, 1960 DOA: 06/23/2023     3 DOS: the patient was seen and examined on 06/28/2023   Brief hospital course: David Hartman is a 63 y.o. male with medical history significant of hypertension, hyperlipidemia, diabetes, GERD, CAD, obesity, gout, depression, anxiety OC cancer, chronic pain presenting with severe hip pain radiating down his leg.   CT of the L-spine which showed chronic pression fracture of L2 and CT of the left hip which showed no acute normality. MRI of the L-spine showed stable L2 L4 compression fractures with progressive disc protrusion at L4-L5 and moderate to severe foraminal narrowing in this area.  Also noted was L5-S1 disc protrusion with mild to moderate foraminal narrowing.  MRI of the left hip showed osteoarthritis and changes favoring subchondral insufficiency fracture versus degenerative changes without evidence of cortical breakthrough.  Patient is admitted to TRH service for further pain management and evaluation.    Assessment and Plan: Intractable left hip pain Radiation to his leg Left hip subchondral insufficiency fracture Neurosurgery advised pain control, steroids, no surgical intervention, epidural steroid injection left L3-L4. Patient wishes to get pain under control, work with PT before discharge and outpatient ESI scheduled. Orthopedic advised non weight bearing left leg. No intervention needed. Continue gabapentin . Outpatient ortho follow up.  PT to work with crutches, rolling walker. Continue with Tylenol  for mild pain, oral Dilaudid  q4 for severe pain, fentanyl  patch, gabapentin . He wishes to be off of IV pain meds..  Hypertension Continue home dose amlodipine , lisinopril , propranolol    Hyperlipidemia Continue atorvastatin    Type 2 Diabetes mellitus Continue Accu-Cheks, sliding scale insulin . Steroids could worsen his blood sugars.  Hold metformin  therapy.   GERD Continue home  PPI.   CAD Continue ASA, atorvastatin .   Gout Continue home allopurinol .   Depression Anxiety Continue Xanax .  Obesity class II BMI 37.31 Diet, exercise and weight reduction advised.      Out of bed to chair. Incentive spirometry. Nursing supportive care. Fall, aspiration precautions. Diet:  Diet Orders (From admission, onward)     Start     Ordered   06/24/23 1333  Diet regular Fluid consistency: Thin  Diet effective now       Question:  Fluid consistency:  Answer:  Thin   06/24/23 1332           DVT prophylaxis: SCDs Start: 06/24/23 1235  Level of care: Med-Surg   Code Status: Full Code  Subjective: Patient is seen and examined today morning.  States his pain better on oral pain meds. Able to ambulate with walker. No nausea, constipation.  Physical Exam: Vitals:   06/27/23 2005 06/28/23 0347 06/28/23 0900 06/28/23 1304  BP: 130/74 (!) 141/73 132/69 116/68  Pulse: 63 63 76 65  Resp: 18 18 18 18   Temp: 98.2 F (36.8 C) 98.3 F (36.8 C) (!) 97.5 F (36.4 C) 98.4 F (36.9 C)  TempSrc: Oral Oral    SpO2: 97% 96% 97% 98%  Weight:      Height:        General - Elderly obese Caucasian male, no distress HEENT - PERRLA, EOMI, atraumatic head, non tender sinuses. Lung - Clear, no rales, rhonchi, wheezes. Heart - S1, S2 heard, no murmurs, rubs, trace pedal edema. Abdomen - Soft, non tender, obese, bowel sounds good Neuro - Alert, awake and oriented x 3, non focal exam. Skin - Warm and dry.  Data Reviewed:      Latest Ref  Rng & Units 06/25/2023    6:45 AM 06/24/2023    2:38 AM 03/25/2023    8:06 AM  CBC  WBC 4.0 - 10.5 K/uL 17.9  9.8  8.1   Hemoglobin 13.0 - 17.0 g/dL 16.1  09.6  04.5   Hematocrit 39.0 - 52.0 % 38.9  40.3  40.0   Platelets 150 - 400 K/uL 246  197  203       Latest Ref Rng & Units 06/25/2023    6:45 AM 06/24/2023    2:38 AM 03/25/2023    8:06 AM  BMP  Glucose 70 - 99 mg/dL 409  811  914   BUN 8 - 23 mg/dL 33  22  16   Creatinine 0.61  - 1.24 mg/dL 7.82  9.56  2.13   BUN/Creat Ratio 6 - 22 (calc)   SEE NOTE:   Sodium 135 - 145 mmol/L 137  139  140   Potassium 3.5 - 5.1 mmol/L 4.3  4.2  4.0   Chloride 98 - 111 mmol/L 104  107  106   CO2 22 - 32 mmol/L 22  23  26    Calcium  8.9 - 10.3 mg/dL 9.0  9.3  8.9    No results found.   Family Communication: Discussed with patient at bedside, he understand and agree. All questions answered.  Disposition: Status is: Changed to inpatient Inpatient because of need for pain control, PT/ OT, ESI lumbar spine.  Planned Discharge Destination: Home     Time spent: 37 minutes  Author: Aisha Hove, MD 06/28/2023 1:12 PM Secure chat 7am to 7pm For on call review [email or web address].

## 2023-06-29 DIAGNOSIS — E119 Type 2 diabetes mellitus without complications: Secondary | ICD-10-CM | POA: Diagnosis not present

## 2023-06-29 DIAGNOSIS — M5416 Radiculopathy, lumbar region: Secondary | ICD-10-CM | POA: Diagnosis not present

## 2023-06-29 DIAGNOSIS — E1159 Type 2 diabetes mellitus with other circulatory complications: Secondary | ICD-10-CM | POA: Diagnosis not present

## 2023-06-29 DIAGNOSIS — G894 Chronic pain syndrome: Secondary | ICD-10-CM | POA: Diagnosis not present

## 2023-06-29 LAB — GLUCOSE, CAPILLARY
Glucose-Capillary: 107 mg/dL — ABNORMAL HIGH (ref 70–99)
Glucose-Capillary: 99 mg/dL (ref 70–99)

## 2023-06-29 MED ORDER — HYDROMORPHONE HCL 2 MG PO TABS
2.0000 mg | ORAL_TABLET | ORAL | 0 refills | Status: AC | PRN
Start: 2023-06-29 — End: 2023-07-04

## 2023-06-29 NOTE — Progress Notes (Signed)
 Physical Therapy Treatment Patient Details Name: David Hartman MRN: [id number] DOB: 22-Jul-1960 Today's Date: 06/29/2023   History of Present Illness 63 y.o. male adm 06/23/23 with acute Lt hip pain unable to bear weight with insufficiency fx and L2, 4 compression fx. PMHx: HTN, HLD, DM, GERD, CAD, obesity, gout, depression, anxiety OC cancer, chronic pain    PT Comments  Pt received long sitting in bed with LLE extended and k-pad under LLE for comfort. Pt mildly impulsive and reporting severe pain, needing intermittent safety cues for RW management, back precs for comfort and LLE NWB status as pt tending to L TDWB at times. Pt performed taller platform step in room with BUE supported by RW to simulate home entry. Pt reports he still only wants RW and PTA encourages him to consider renting a RW if he finds his pain/fatigue level is too severe at home with hopping. Handouts brought for back precs for comfort and alternate stair technique in case he finds himself at location without handrails or ramp. Pt continues to benefit from PT services to progress toward functional mobility goals.     If plan is discharge home, recommend the following: A little help with walking and/or transfers;Assistance with cooking/housework;Help with stairs or ramp for entrance   Can travel by private vehicle        Equipment Recommendations  Rolling walker (2 wheels) (may benefit from renting a wheelchair in addition to RW DME depending on how long he is NWB status)    Recommendations for Other Services       Precautions / Restrictions Precautions Precautions: Fall;Back Precaution Booklet Issued: Yes (comment) Recall of Precautions/Restrictions: Intact Restrictions Weight Bearing Restrictions Per Provider Order: Yes LLE Weight Bearing Per Provider Order: Non weight bearing     Mobility  Bed Mobility Overal bed mobility: Independent             General bed mobility comments: EOB upon arrival and  departure    Transfers Overall transfer level: Needs assistance Equipment used: Rolling walker (2 wheels) Transfers: Sit to/from Stand Sit to Stand: Supervision, Contact guard assist           General transfer comment: cues for LLE positioning and hand placement, pt performs L TDWB but cued not to for his safety.    Ambulation/Gait Ambulation/Gait assistance: Supervision, Min assist Gait Distance (Feet): 15 Feet (x2 including seated break in bathroom) Assistive device: Rolling walker (2 wheels) Gait Pattern/deviations: Step-to pattern (hop-to)       General Gait Details: Mild impulsivity, esp during turns/transfers, x1 episode light minA for safety as pt lateral LOB to the right when turning prior to sitting. distance limited due to pt pain and need for toileting.   Stairs Stairs: Yes Stairs assistance: Contact guard assist Stair Management: Step to pattern, Backwards, With walker Number of Stairs: 1 General stair comments: single 8" step (no smaller height platform available at time of session) x1 reps with initial visual/verbal demo, CGA for safety and pt beginning to perform prior to PTA being able to place gait belt on him (pt impulsive), mild instability but pt able to maintain balance with BUE supported by RW to simulate bil rails. Pt also given handout on RW tilted on steps in case stair rails are too far apart (pt claims they are close together) or if he has to perform stairs anywhere else that don't have rails.   Wheelchair Mobility     Tilt Bed    Modified Rankin (Stroke Patients Only)  Balance Overall balance assessment: Mild deficits observed, not formally tested                                          Communication Communication Communication: No apparent difficulties  Cognition Arousal: Alert Behavior During Therapy: WFL for tasks assessed/performed, Impulsive   PT - Cognitive impairments: No apparent impairments                        PT - Cognition Comments: Pt needs min cues for safety/activity pacing, mildly impulsive and likely related to high pain score. Following commands: Intact      Cueing Cueing Techniques: Verbal cues  Exercises      General Comments General comments (skin integrity, edema, etc.): no acute s/sx distress, VSS per chart review earlier in the day.      Pertinent Vitals/Pain Pain Assessment Pain Assessment: 0-10 Pain Score: 9  Pain Location: L hip/LLE Pain Descriptors / Indicators: Aching, Constant, Grimacing, Guarding Pain Intervention(s): Limited activity within patient's tolerance, Monitored during session, Repositioned, Patient requesting pain meds-RN notified, Other (comment) (pt already has k-pad on LLE)    Home Living                          Prior Function            PT Goals (current goals can now be found in the care plan section) Acute Rehab PT Goals Patient Stated Goal: be able to go home PT Goal Formulation: With patient Time For Goal Achievement: 07/11/23 Progress towards PT goals: Progressing toward goals    Frequency    Min 2X/week      PT Plan      Co-evaluation              AM-PAC PT "6 Clicks" Mobility   Outcome Measure  Help needed turning from your back to your side while in a flat bed without using bedrails?: None Help needed moving from lying on your back to sitting on the side of a flat bed without using bedrails?: None Help needed moving to and from a bed to a chair (including a wheelchair)?: A Little Help needed standing up from a chair using your arms (e.g., wheelchair or bedside chair)?: A Little Help needed to walk in hospital room?: A Little Help needed climbing 3-5 steps with a railing? : A Lot (mod cues and assist anticipated, esp if only 1 rail) 6 Click Score: 19    End of Session Equipment Utilized During Treatment: Other (comment) (pt refusing gait belt) Activity Tolerance: Patient tolerated  treatment well;Patient limited by pain Patient left: in bed;with call bell/phone within reach;with bed alarm set Nurse Communication: Mobility status;Weight bearing status PT Visit Diagnosis: Other abnormalities of gait and mobility (R26.89);Difficulty in walking, not elsewhere classified (R26.2);Pain Pain - Right/Left: Left Pain - part of body: Hip     Time: 1146-1202 PT Time Calculation (min) (ACUTE ONLY): 16 min  Charges:    $Gait Training: 8-22 mins PT General Charges $$ ACUTE PT VISIT: 1 Visit                     Anjeanette Petzold P., PTA Acute Rehabilitation Services Secure Chat Preferred 9a-5:30pm Office: [phone]    Mariel Sh[facility] 06/29/2023, 12:19 PM

## 2023-06-29 NOTE — Discharge Summary (Signed)
 Physician Discharge Summary   Patient: David Hartman MRN: [id number] DOB: [birth year]  Admit date:     06/23/2023  Discharge date: {dischdate:26783}  Discharge Physician: Aisha Hove   PCP: Jenelle Mis, FNP   Recommendations at discharge:  {Tip this will not be part of the note when signed- Example include specific recommendations for outpatient follow-up, pending tests to follow-up on. (Optional):26781}  ***  Discharge Diagnoses: Principal Problem:   Intractable pain Active Problems:   Depression   GERD (gastroesophageal reflux disease)   Hyperlipidemia   Anxiety   Gout   History of prostate cancer   Hypertension associated with diabetes (HCC)   Morbid obesity (HCC)   Chronic pain syndrome   Type 2 diabetes mellitus without complication, without long-term current use of insulin  (HCC)   Coronary artery disease involving native coronary artery of native heart without angina pectoris  Resolved Problems:   * No resolved hospital problems. [facility] Course: No notes on file  Assessment and Plan: No notes have been filed under this hospital service. Service: Hospitalist     {Tip this will not be part of the note when signed Body mass index is 37.31 kg/m. , ,  (Optional):26781}  {(NOTE) Pain control PDMP Statment (Optional):26782} Consultants: *** Procedures performed: ***  Disposition: {Plan; Disposition:26390} Diet recommendation:  Discharge Diet Orders (From admission, onward)     Start     Ordered   06/29/23 0000  Diet - low sodium heart healthy        06/29/23 1048           {Diet_Plan:26776} DISCHARGE MEDICATION: Allergies as of 06/29/2023       Reactions   Pregabalin Other (See Comments), Swelling   Blisters in mouth and sore throat per pt.,  Patient states he has not completely come off as he was taking 3 tab. Daily and now he is taking 1 tablet since the middle of last week, Tuesday 9th of June.   Simvastatin  Other (See Comments)    Other reaction(s): interacts with amlodipine    Trazodone  And Nefazodone Other (See Comments)   Patients states "it caused my blood pressure to raise and it didn't help me sleep"        Medication List     TAKE these medications    acetaminophen  500 MG tablet Commonly known as: TYLENOL  Take 1,000 mg by mouth in the morning.   allopurinol  300 MG tablet Commonly known as: ZYLOPRIM  TAKE 1 TABLET(300 MG) BY MOUTH DAILY   ALPRAZolam  1 MG tablet Commonly known as: XANAX  TAKE 1/2 TABLET(0.5 MG) BY MOUTH AT BEDTIME AS NEEDED FOR ANXIETY   amLODipine  10 MG tablet Commonly known as: NORVASC  Take 1 tablet (10 mg total) by mouth daily.   ascorbic acid 500 MG tablet Commonly known as: VITAMIN C Take 500 mg by mouth daily.   aspirin  EC 81 MG tablet Commonly known as: Aspirin  Low Dose Take 1 tablet (81 mg total) by mouth daily. Swallow whole.   atorvastatin  80 MG tablet Commonly known as: LIPITOR TAKE 1 TABLET(80 MG) BY MOUTH DAILY What changed: See the new instructions.   cyclobenzaprine  10 MG tablet Commonly known as: FLEXERIL  Take 1 tablet (10 mg total) by mouth 2 (two) times daily as needed for muscle spasms.   diphenhydrAMINE  25 MG tablet Commonly known as: BENADRYL  Take 25 mg by mouth at bedtime.   ferrous sulfate  325 (65 FE) MG tablet Take 325 mg by mouth daily.   gabapentin  100 MG capsule  Commonly known as: NEURONTIN  Take 1 capsule (100 mg total) by mouth 3 (three) times daily.   HYDROmorphone  2 MG tablet Commonly known as: DILAUDID  Take 1 tablet (2 mg total) by mouth every 4 (four) hours as needed for up to 5 days for severe pain (pain score 7-10).   ibuprofen  200 MG tablet Commonly known as: ADVIL  Take 400 mg by mouth at bedtime.   lisinopril  40 MG tablet Commonly known as: ZESTRIL  TAKE 1 TABLET(40 MG) BY MOUTH DAILY   MELATONIN PO Take 10 mg by mouth at bedtime.   metFORMIN  500 MG 24 hr tablet Commonly known as: GLUCOPHAGE -XR TAKE 2 TABLETS(1000  MG) BY MOUTH TWICE DAILY WITH A MEAL What changed: See the new instructions.   multivitamin with minerals Tabs tablet Take 1 tablet by mouth daily. Men over 50   omeprazole  20 MG tablet Commonly known as: PRILOSEC  OTC Take 20 mg by mouth in the morning.   propranolol  40 MG tablet Commonly known as: INDERAL  TAKE 1 TABLET(40 MG) BY MOUTH DAILY        Follow-up Information     Schedule an appointment as soon as possible for a visit  with Jenelle Mis, FNP.   Specialty: Family Medicine Why: pain control Contact information: [address] [place] [address] [phone]         Schedule an appointment as soon as possible for a visit  with [facility].   Specialty: Neurosurgery Why: sciatica Contact information: [address] [place] [address] [phone]         [facility] Emergency Department at [facility].   Specialty: Emergency Medicine Why: If symptoms worsen Contact information: [address] [place]  [address] [phone]               Discharge Exam: David Hartman Weights   06/23/23 2122 06/24/23 0143  Weight: 118 kg 117.9 kg   ***  Condition at discharge: {DC Condition:26389}  The results of significant diagnostics from this hospitalization (including imaging, microbiology, ancillary and laboratory) are listed below for reference.   Imaging Studies: MR HIP LEFT WO CONTRAST Result Date: 06/24/2023 CLINICAL DATA:  Left hip pain. EXAM: MR OF THE LEFT HIP WITHOUT CONTRAST TECHNIQUE: Multiplanar, multisequence MR imaging was performed. No intravenous contrast was administered. COMPARISON:  Same day CT of the left hip dated 06/24/2023 at 3:42 a.m. FINDINGS: Soft tissue and Muscle: There is no obvious soft tissue swelling. Muscle bulk is relatively symmetric bilaterally. Hamstring tendon origins are intact. Gluteal cuff insertions are intact. Stable chronic/benign  4.2 cm cystic lesion in the left pelvis. No enlarged lymph nodes identified in the field of view. Bones/ Hip: Mild-to-moderate degenerative changes of the left hip with joint space narrowing and marginal osteophytosis. There is T1 hypointense somewhat curvilinear signal with corresponding T2 hyperintense signal at the left superior acetabulum, which could reflect degenerative subchondral cystic change/edema versus subchondral insufficiency fracture. No evidence of cortical breakthrough or extraosseous soft tissue extension. No joint effusion. Labrum is grossly intact, although suboptimally evaluated due to lack of intra-articular fluid/contrast ligamentum teres and transverse ligaments are intact. The remainder of the bone marrow signal intensity is within normal limits. Mild subchondral marrow edema along the right superolateral acetabulum. The sacroiliac joints and pubic symphysis are anatomically aligned. Degenerative changes of the visualized lower lumbar spine. 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 No evidence of cortical breakthrough or extraosseous soft tissue extension, however, underlying lesion cannot be entirely excluded and follow-up or correlation with bone scan or PET-CT could be considered if clinically warranted. Electronically Signed   By: Mannie Seek M.D.   On: 06/24/2023 10:20   MR LUMBAR SPINE WO CONTRAST Result Date: 06/24/2023 EXAM: MRI LUMBAR SPINE 06/24/2023 06:08:00 AM TECHNIQUE: Multiplanar multisequence MRI of the lumbar spine was performed without the administration of intravenous contrast. COMPARISON: CT of the lumbar spine 06/24/2023. MRI of the lumbar spine 11/27/2022. CLINICAL HISTORY: Compression fracture, lumbar. FINDINGS: BONES AND ALIGNMENT: Remote superior endplate compression fractures at L2 and L4  are stable. No acute fractures are present. SPINAL CORD: The conus medullaris terminates at L1, within normal limits. SOFT TISSUES: No paraspinal mass. L1-L2: No significant disc herniation. No spinal canal stenosis or neural foraminal narrowing. L2-L3: A broad-based disc protrusion is present. Mild facet hypertrophy is present bilaterally. Mild foraminal narrowing present bilaterally, stable. L3-L4: A leftward disc protrusion is again seen. Mild left foraminal narrowing is stable. L4-L5: A progressive leftward disc protrusion is present. Moderate facet hypertrophy has progressed bilaterally. Moderate-to-severe foraminal narrowing, left greater than right, has progressed. L5-S1: A broad-based disc protrusion is present. Advanced facet hypertrophy has progressed bilaterally. Moderate right and mild left subarticular stenosis. IMPRESSION: 1. Stable remote superior endplate compression fractures at L2 and L4. No acute fractures. 2. Progressive leftward disc protrusion at L4-5 with moderate-to-severe foraminal narrowing, left greater than right, and moderate facet hypertrophy bilaterally. 3. Broad-based disc protrusion at L5-S1 with advanced facet hypertrophy bilaterally and moderate right and mild left subarticular foraminal narrowing. Electronically signed by: Audree Leas MD 06/24/2023 06:22 AM EDT RP Workstation: [id number]   CT Hip Left Wo Contrast Result Date: 06/24/2023 CLINICAL DATA:  Left hip pain EXAM: CT OF THE LEFT HIP WITHOUT CONTRAST TECHNIQUE: Multidetector CT imaging of the left hip was performed according to the standard protocol. Multiplanar CT image reconstructions were also generated. RADIATION DOSE REDUCTION: This exam was performed according to the departmental dose-optimization program which includes automated exposure control, adjustment of the mA and/or kV according to patient size and/or use of iterative reconstruction technique. COMPARISON:  Left hip radiographs dated 06/23/2023  FINDINGS: No fracture or dislocation is seen. Visualized bony pelvis appears intact. Left hip joint space is preserved. Calcified cystic left pelvic lesion measuring 4.2 cm (image 1), chronic/benign. IMPRESSION: No fracture or dislocation is seen. Electronically Signed   By: Zadie Herter M.D.   On: 06/24/2023 03:57   CT Lumbar Spine Wo Contrast Result Date: 06/24/2023 CLINICAL DATA:  Lumbar compression fracture EXAM: CT LUMBAR SPINE WITHOUT CONTRAST TECHNIQUE: Multidetector CT imaging of the lumbar spine was performed without intravenous contrast administration. Multiplanar CT image reconstructions were also generated. RADIATION DOSE REDUCTION: This exam was performed according to the departmental dose-optimization program which includes automated exposure control, adjustment of the mA and/or kV according to patient size and/or use of iterative reconstruction technique. COMPARISON:  CTA chest abdomen pelvis dated 06/30/2022 FINDINGS: Segmentation: 5 lumbar type vertebral bodies. Alignment: Normal lumbar lordosis. Vertebrae: Moderate superior endplate compression fracture deformity at L2 (sagittal image 40), chronic. Vertebral body heights are otherwise maintained. Paraspinal and other soft tissues: Vascular calcifications. Rim calcified lesion the left pelvis (image 147), incompletely visualized, but chronic/benign. Disc levels: Moderate multilevel degenerative changes of the visualized thoracolumbar spine. Spinal canal is patent. IMPRESSION: Moderate superior endplate compression fracture deformity at L2, chronic. No acute fracture. Moderate multilevel  degenerative changes. Electronically Signed   By: Zadie Herter M.D.   On: 06/24/2023 03:56   DG Hip Unilat W or Wo Pelvis 2-3 Views Left Result Date: 06/23/2023 EXAM: 3 VIEW(S) XRAY OF THE LEFT HIP 06/23/2023 09:39:00 PM COMPARISON: None available. CLINICAL HISTORY: Hip pain. Pt states he got up from the dining room table around 630 tonight and felt  pain in his left hip that has not gone away. FINDINGS: BONES AND JOINTS: No acute fracture or focal osseous lesion. The hip joint is maintained. No significant degenerative changes. SOFT TISSUES: The soft tissues are unremarkable. IMPRESSION: 1. No acute findings. Electronically signed by: Zadie Herter MD 06/23/2023 09:41 PM EDT RP Workstation: [id number]    Microbiology: Results for orders placed or performed in visit on 03/03/23  STREP GROUP A AG, W/REFLEX TO CULT     Status: None   Collection Time: 03/03/23  9:57 AM   Specimen: Throat  Result Value Ref Range Status   Streptococcus Group A AG NOT DETECTED NOT DETECTED Final    Comment: . The American Academy of Pediatrics recommends that a  throat culture be performed if the Streptococcus  Group A rapid antigen assay result is negative,  therefore it will be reflexed to Streptococcus  Group A culture.    Culture, Group A Strep     Status: None   Collection Time: 03/03/23  9:57 AM  Result Value Ref Range Status   Micro Number [id number]  Final   SPECIMEN QUALITY: Adequate  Final   SOURCE: THROAT  Final   STATUS: FINAL  Final   RESULT: No group A Streptococcus isolated  Final    Labs: CBC: Recent Labs  Lab 06/24/23 0238 06/25/23 0645  WBC 9.8 17.9*  NEUTROABS 6.7  --   HGB 13.3 13.3  HCT 40.3 38.9*  MCV 92.9 91.7  PLT 197 246   Basic Metabolic Panel: Recent Labs  Lab 06/24/23 0238 06/25/23 0645  NA 139 137  K 4.2 4.3  CL 107 104  CO2 23 22  GLUCOSE 131* 126*  BUN 22 33*  CREATININE 0.78 1.00  CALCIUM  9.3 9.0   Liver Function Tests: Recent Labs  Lab 06/25/23 0645  AST 31  ALT 34  ALKPHOS 63  BILITOT 1.1  PROT 6.6  ALBUMIN 3.7   CBG: Recent Labs  Lab 06/28/23 0735 06/28/23 1232 06/28/23 1631 06/28/23 2052 06/29/23 0735  GLUCAP 104* 136* 122* 119* 107*    Discharge time spent: {LESS THAN/GREATER NWGN:56213} 30 minutes.  Signed: Aisha Hove, MD [facility] 06/29/2023

## 2023-06-29 NOTE — Progress Notes (Signed)
 Discharge instructions reviewed with patient utilizing teach back method no questions at this time. Patient awaiting DME  delivery to room and will be discharged to home.

## 2023-06-29 NOTE — Care Management Important Message (Signed)
 Important Message  Patient Details  Name: David Hartman MRN: [id number] Date of Birth: [birth year]   Important Message Given:  Yes - Medicare IM     Wynonia Hedges 06/29/2023, 1:58 PM

## 2023-06-29 NOTE — Progress Notes (Signed)
 Occupational Therapy Treatment Patient Details Name: David Hartman MRN: [id number] DOB: 01/02/1961 Today's Date: 06/29/2023   History of present illness 63 y.o. male adm 06/23/23 with acute Lt hip pain unable to bear weight with insufficiency fx and L2, 4 compression fx. PMHx: HTN, HLD, DM, GERD, CAD, obesity, gout, depression, anxiety OC cancer, chronic pain   OT comments  Pt progressing toward goals, reports 8/10 pain in LLE this session, but able to participate in hallway/in-room mobility. Pt needing CGA for transfers/ambulation, demo's ability to static stand with UE support to open doors/perform ADL tasks. Pt reports he cannot get w/c from friends at d/c. Discussed with case mgmt new recommendation for w/c and RW for home. Pt presenting with impairments listed below, will follow acutely. Continue to recommend HHOT at d/c.       If plan is discharge home, recommend the following:  A little help with walking and/or transfers;A little help with bathing/dressing/bathroom;Assistance with cooking/housework;Assist for transportation;Help with stairs or ramp for entrance   Equipment Recommendations  Wheelchair (measurements OT);Wheelchair cushion (measurements OT);Other (comment) (RW)    Recommendations for Other Services PT consult    Precautions / Restrictions Precautions Precautions: Fall Recall of Precautions/Restrictions: Intact Restrictions Weight Bearing Restrictions Per Provider Order: Yes LLE Weight Bearing Per Provider Order: Non weight bearing       Mobility Bed Mobility Overal bed mobility: Independent                  Transfers Overall transfer level: Needs assistance Equipment used: Rolling walker (2 wheels) Transfers: Sit to/from Stand Sit to Stand: Supervision                 Balance Overall balance assessment: Mild deficits observed, not formally tested                                         ADL either performed or assessed  with clinical judgement   ADL Overall ADL's : Needs assistance/impaired                         Toilet Transfer: Contact guard assist;Rolling walker (2 wheels)           Functional mobility during ADLs: Contact guard assist;Rolling walker (2 wheels)      Extremity/Trunk Assessment Upper Extremity Assessment Upper Extremity Assessment: Overall WFL for tasks assessed   Lower Extremity Assessment Lower Extremity Assessment: Defer to PT evaluation        Vision   Vision Assessment?: No apparent visual deficits   Perception Perception Perception: Not tested   Praxis Praxis Praxis: Not tested   Communication Communication Communication: No apparent difficulties   Cognition Arousal: Alert Behavior During Therapy: WFL for tasks assessed/performed Cognition: No apparent impairments                               Following commands: Intact        Cueing   Cueing Techniques: Verbal cues  Exercises      Shoulder Instructions       General Comments VSS    Pertinent Vitals/ Pain       Pain Assessment Pain Assessment: Faces Pain Score: 8  Faces Pain Scale: Hurts worst Pain Location: L hip/LLE Pain Descriptors / Indicators: Aching, Constant Pain Intervention(s): Limited activity within patient's  tolerance, Monitored during session, Repositioned  Home Living                                          Prior Functioning/Environment              Frequency  Min 1X/week        Progress Toward Goals  OT Goals(current goals can now be found in the care plan section)  Progress towards OT goals: Progressing toward goals  Acute Rehab OT Goals Patient Stated Goal: none stated OT Goal Formulation: With patient Time For Goal Achievement: 07/11/23 Potential to Achieve Goals: Good ADL Goals Pt Will Perform Tub/Shower Transfer: Shower transfer;ambulating;shower seat Additional ADL Goal #1: pt will tolerate OOB activity  x15 min in order to improve activity tolerance for ADLs  Plan      Co-evaluation                 AM-PAC OT "6 Clicks" Daily Activity     Outcome Measure   Help from another person eating meals?: A Little Help from another person taking care of personal grooming?: A Little Help from another person toileting, which includes using toliet, bedpan, or urinal?: A Little Help from another person bathing (including washing, rinsing, drying)?: A Little Help from another person to put on and taking off regular upper body clothing?: A Little Help from another person to put on and taking off regular lower body clothing?: A Little 6 Click Score: 18    End of Session Equipment Utilized During Treatment: Rolling walker (2 wheels);Gait belt  OT Visit Diagnosis: Unsteadiness on feet (R26.81);Other abnormalities of gait and mobility (R26.89);Muscle weakness (generalized) (M62.81)   Activity Tolerance Patient tolerated treatment well   Patient Left in bed;with call bell/phone within reach;with bed alarm set;with nursing/sitter in room   Nurse Communication Mobility status        Time: 4782-9562 OT Time Calculation (min): 12 min  Charges: OT General Charges $OT Visit: 1 Visit OT Treatments $Therapeutic Activity: 8-22 mins  Saara Kijowski K, OTD, OTR/L SecureChat Preferred Acute Rehab [phone]   Benedict Brain Koonce 06/29/2023, 8:58 AM

## 2023-06-29 NOTE — TOC Progression Note (Signed)
 Transition of Care [facility]) - Progression Note    Patient Details  Name: David Hartman MRN: [id number] Date of Birth: 1960/06/24  Transition of Care [facility]) CM/SW Contact  Mirian Ames Doloris Freund, RN Phone Number: 06/29/2023, 11:59 AM  Clinical Narrative:    Case Manager spoke with patient concerning DME and Home Health needs. Patient will discharge home after DME arrives. HH has been arranged with [facility].   Expected Discharge Plan: Home w Home Health Services Barriers to Discharge: No Barriers Identified  Expected Discharge Plan and Services   Discharge Planning Services: CM Consult Post Acute Care Choice: Home Health, Durable Medical Equipment Living arrangements for the past 2 months: Single Family Home Expected Discharge Date: 06/29/23               DME Arranged: Otho Blitz rolling DME Agency: [employer] Date DME Agency Contacted: 06/29/23 Time DME Agency Contacted: 1141 Representative spoke with at DME Agency: Harriet Limber HH Arranged: PT HH Agency: [facility] Date [facility] Agency Contacted: 06/29/23 Time HH Agency Contacted: 1145 Representative spoke with at [facility] Agency: Randel Buss   Social Determinants of Health (SDOH) Interventions SDOH Screenings   Food Insecurity: No Food Insecurity (06/24/2023)  Housing: Low Risk  (06/24/2023)  Transportation Needs: No Transportation Needs (06/24/2023)  Utilities: Not At Risk (06/24/2023)  Alcohol Screen: Low Risk  (05/01/2022)  Depression (PHQ2-9): Low Risk  (05/12/2023)  Financial Resource Strain: Low Risk  (05/01/2022)  Physical Activity: Insufficiently Active (05/01/2022)  Social Connections: Moderately Integrated (06/24/2023)  Stress: No Stress Concern Present (05/01/2022)  Tobacco Use: Low Risk  (06/23/2023)  Recent Concern: Tobacco Use - Medium Risk (05/18/2023)   Received from [facility]    Readmission Risk Interventions     No data to display

## 2023-06-30 ENCOUNTER — Telehealth: Payer: Self-pay

## 2023-06-30 NOTE — Transitions of Care (Post Inpatient/ED Visit) (Signed)
 06/30/2023  Name: David Hartman MRN: [id number] DOB: Jan 09, 1961  Today's TOC FU Call Status: Today's TOC FU Call Status:: Successful TOC FU Call Completed TOC FU Call Complete Date: 06/30/23 Patient's Name and Date of Birth confirmed.  Transition Care Management Follow-up Telephone Call Date of Discharge: 06/29/23 Discharge Facility: [facility]) Type of Discharge: Inpatient Admission Primary Inpatient Discharge Diagnosis:: back pain How have you been since you were released from the hospital?: Same  Items Reviewed: Did you receive and understand the discharge instructions provided?: Yes Medications obtained,verified, and reconciled?: Yes (Medications Reviewed) Any new allergies since your discharge?: No Dietary orders reviewed?: Yes Do you have support at home?: No  Medications Reviewed Today: Medications Reviewed Today     Reviewed by Darrall Ellison, LPN (Licensed Practical Nurse) on 06/30/23 at 0920  Med List Status: <None>   Medication Order Taking? Sig Documenting Provider Last Dose Status Informant  acetaminophen  (TYLENOL ) 500 MG tablet [id number] No Take 1,000 mg by mouth in the morning. [provider] 06/23/2023 Active Self, Pharmacy Records  allopurinol  (ZYLOPRIM ) 300 MG tablet [id number] No TAKE 1 TABLET(300 MG) BY MOUTH DAILY Jenelle Mis, FNP 06/23/2023 Active Self, Pharmacy Records  ALPRAZolam  (XANAX ) 1 MG tablet [id number] No TAKE 1/2 TABLET(0.5 MG) BY MOUTH AT BEDTIME AS NEEDED FOR ANXIETY Jenelle Mis, FNP Past Week Active Self, Pharmacy Records  amLODipine  (NORVASC ) 10 MG tablet [id number] No Take 1 tablet (10 mg total) by mouth daily. Jann Melody, MD 06/23/2023 Active Self, Pharmacy Records  ascorbic acid (VITAMIN C) 500 MG tablet [id number] No Take 500 mg by mouth daily. [provider] 06/23/2023 Active Self, Pharmacy Records  aspirin  EC (ASPIRIN  LOW DOSE) 81 MG tablet [id number] No Take 1 tablet (81 mg total) by mouth daily.  Swallow whole. Ava Boatman, NP 06/23/2023 Active Self, Pharmacy Records  atorvastatin  (LIPITOR) 80 MG tablet [id number] No TAKE 1 TABLET(80 MG) BY MOUTH DAILY  Patient taking differently: Take 80 mg by mouth at bedtime.   Jann Melody, MD 06/23/2023 Active Self, Pharmacy Records  cyclobenzaprine  (FLEXERIL ) 10 MG tablet [id number]  Take 1 tablet (10 mg total) by mouth 2 (two) times daily as needed for muscle spasms. Mesner, Reymundo Caulk, MD  Active   diphenhydrAMINE  (BENADRYL ) 25 MG tablet [id number] No Take 25 mg by mouth at bedtime. [provider] 06/23/2023 Active Self, Pharmacy Records  ferrous sulfate  325 (65 FE) MG tablet [id number] No Take 325 mg by mouth daily. [provider] 06/23/2023 Active Self, Pharmacy Records  gabapentin  (NEURONTIN ) 100 MG capsule [id number]  Take 1 capsule (100 mg total) by mouth 3 (three) times daily. Mesner, Reymundo Caulk, MD  Active   HYDROmorphone  (DILAUDID ) 2 MG tablet [id number]  Take 1 tablet (2 mg total) by mouth every 4 (four) hours as needed for up to 5 days for severe pain (pain score 7-10). Aisha Hove, MD  Active   ibuprofen  (ADVIL ) 200 MG tablet [id number] No Take 400 mg by mouth at bedtime. [provider] 06/23/2023 Active Self, Pharmacy Records  lisinopril  (ZESTRIL ) 40 MG tablet [id number] No TAKE 1 TABLET(40 MG) BY MOUTH DAILY Austine Lefort, MD 06/23/2023 Active Self, Pharmacy Records  MELATONIN PO [id number] No Take 10 mg by mouth at bedtime. [provider] 06/23/2023 Active Self, Pharmacy Records  metFORMIN  (GLUCOPHAGE -XR) 500 MG 24 hr tablet [id number] No TAKE 2 TABLETS(1000 MG) BY MOUTH TWICE DAILY WITH A MEAL  Patient taking differently: Take 500 mg by mouth 2 (two) times daily with a  meal. Pt takes two tablets twice daily with meals   Austine Lefort, MD 06/23/2023 Active Self, Pharmacy Records  Multiple Vitamin (MULTIVITAMIN WITH MINERALS) TABS tablet [id number] No Take 1 tablet by mouth daily. Men over 64  [provider] 06/23/2023 Active Self, Pharmacy Records  omeprazole  (PRILOSEC  OTC) 20 MG tablet [id number] No Take 20 mg by mouth in the morning. [provider] 06/23/2023 Active Self, Pharmacy Records  propranolol  (INDERAL ) 40 MG tablet [id number] No TAKE 1 TABLET(40 MG) BY MOUTH DAILY Austine Lefort, MD 06/23/2023 Active Self, Pharmacy Records            Home Care and Equipment/Supplies: Were Home Health Services Ordered?: Yes Name of Home Health Agency:: [facility] Has Agency set up a time to come to your home?: No Any new equipment or medical supplies ordered?: Yes Name of Medical supply agency?: unknown Were you able to get the equipment/medical supplies?: Yes Do you have any questions related to the use of the equipment/supplies?: No  Functional Questionnaire: Do you need assistance with bathing/showering or dressing?: No Do you need assistance with meal preparation?: No Do you need assistance with eating?: No Do you have difficulty maintaining continence: No Do you need assistance with getting out of bed/getting out of a chair/moving?: No Do you have difficulty managing or taking your medications?: No  Follow up appointments reviewed: PCP Follow-up appointment confirmed?: Yes Date of PCP follow-up appointment?: 07/09/23 Follow-up Provider: [facility] Follow-up appointment confirmed?: No Reason Specialist Follow-Up Not Confirmed: Patient has Specialist Provider Number and will Call for Appointment Do you need transportation to your follow-up appointment?: No Do you understand care options if your condition(s) worsen?: Yes-patient verbalized understanding    SIGNATURE Darrall Ellison, LPN [facility], [place] Nurse Health Advisor Direct Dial [phone]

## 2023-07-03 ENCOUNTER — Telehealth: Payer: Self-pay | Admitting: *Deleted

## 2023-07-03 DIAGNOSIS — R52 Pain, unspecified: Secondary | ICD-10-CM

## 2023-07-03 NOTE — Telephone Encounter (Signed)
 Pt needs order for orders for hh

## 2023-07-06 ENCOUNTER — Encounter: Payer: Self-pay | Admitting: Internal Medicine

## 2023-07-06 ENCOUNTER — Telehealth: Payer: Self-pay | Admitting: Surgery

## 2023-07-06 NOTE — Telephone Encounter (Signed)
 ED RNCM  received a call from this patient concerning the outpatient radiology epidural steroid injections that he said you scheduled for him. But according to the patient when he called they were unaware of this and said they do not have the  orders. Notified Dr. Sundra Engel discharge MD reordered procedure. Attempted to update patient no answer, will made 2nd attempt later today.

## 2023-07-07 ENCOUNTER — Other Ambulatory Visit (HOSPITAL_COMMUNITY): Payer: Self-pay | Admitting: Internal Medicine

## 2023-07-07 ENCOUNTER — Telehealth: Payer: Self-pay | Admitting: Family Medicine

## 2023-07-07 ENCOUNTER — Telehealth (HOSPITAL_COMMUNITY): Payer: Self-pay

## 2023-07-07 DIAGNOSIS — M5432 Sciatica, left side: Secondary | ICD-10-CM

## 2023-07-07 DIAGNOSIS — R52 Pain, unspecified: Secondary | ICD-10-CM

## 2023-07-07 NOTE — Telephone Encounter (Unsigned)
 Copied from CRM [id number]. Topic: Referral - Question >> Jul 07, 2023 11:42 AM Donee H wrote: Reason for CRM: Jenna with Physical Therapy called to request a verbal approval  for physical therapy plan of care for patient. Callback number [phone]

## 2023-07-07 NOTE — Telephone Encounter (Signed)
-----   Message from Federico Hopkins sent at 07/07/2023  1:53 PM EDT ----- Regarding: RE: ESI That works! ----- Message ----- From: Faye Hoops Sent: 07/07/2023   7:56 AM EDT To: Federico Hopkins, MD Subject: ESI                                            Morning Suttle,   I have an order for this pt to have an ESI left L4-5. He would like to do next week. You are here Monday at [facility]. Is it ok to schedule with you? His MRI lumbar is in epic.  Thanks,  Merlinda Starling

## 2023-07-08 ENCOUNTER — Encounter: Payer: Self-pay | Admitting: Family Medicine

## 2023-07-09 ENCOUNTER — Other Ambulatory Visit: Payer: Self-pay | Admitting: Family Medicine

## 2023-07-09 ENCOUNTER — Telehealth: Admitting: Family Medicine

## 2023-07-09 ENCOUNTER — Encounter: Payer: Self-pay | Admitting: Family Medicine

## 2023-07-09 DIAGNOSIS — M25552 Pain in left hip: Secondary | ICD-10-CM | POA: Insufficient documentation

## 2023-07-09 DIAGNOSIS — R52 Pain, unspecified: Secondary | ICD-10-CM

## 2023-07-09 MED ORDER — HYDROMORPHONE HCL 2 MG PO TABS: 2.0000 mg | ORAL_TABLET | ORAL | 0 refills | Status: AC | PRN

## 2023-07-09 NOTE — Assessment & Plan Note (Addendum)
 S/P ED visit. Ongoing 8-10 pain, has completed his course of Hydromorphone  2mg  q4h PRN. Is taking Gabapentin , Flexeril , Tylenol  as well. Medications not sedating per patient and he is tolerating well. Plans for EBI next Thursday and pain clinic in July. Will refill 7d rx for Hydromorphone .Has been seeing PT, ambulating with walker and wheelchair and remaining NWB. Will follow up with Neuro and ortho surg after 3 weeks NWB.  Instructed to proceed to ED if symptoms worsen.

## 2023-07-09 NOTE — Progress Notes (Signed)
 Virtual Visit via Video note  I connected with David Hartman on 07/09/23 at 1445 by video and verified that I am speaking with the correct person using two identifiers. David Hartman is currently located at home and no one is currently with him during visit. The provider, Jenelle Mis, FNP is located in their office at time of visit.  I discussed the limitations, risks, security and privacy concerns of performing an evaluation and management service by video and the availability of in person appointments. I also discussed with the patient that there may be a patient responsible charge related to this service. The patient expressed understanding and agreed to proceed.  Subjective: PCP: Jenelle Mis, FNP  No chief complaint on file.   HPI Pt being seen today for HFU. He was hospitalized from 06/23/2023-03/29/2023 for severe left hip pain and lumbar radiculopathy, see below for hospital course. Was found to have a left hip hairline fracture. He remains NWB for 3 weeks on LLE so was unable to come to this visit today in person. Is also using wheelchair. This occurred when standing up from dining table which caused pain that eventually worsened. ESI scheduled with radiology: stop SA 5d ahead, scheduled for 6/26 Pain clinic follow up: 7/28 Neurosurgery: appt in July Orthopedic surgery follow up: after NWB for 3 weeks to schedule appt with Ortho Ambulating with rolling walker and remains NWB LLE, has home health.  Pain managed with Flexeril  10mg  BID PRN and Hydromorphone  2mg  q4h PRN, gabapentin  100mg  TID   Discharge Summary 06/29/2023 for reference only: David Hartman is a 63 y.o. male with medical history significant of hypertension, hyperlipidemia, diabetes, GERD, CAD, obesity, gout, depression, anxiety OC cancer, chronic pain presenting with severe hip pain radiating down his leg.    CT of the L-spine which showed chronic pression fracture of L2 and CT of the left hip which showed  no acute normality. MRI of the L-spine showed stable L2 L4 compression fractures with progressive disc protrusion at L4-L5 and moderate to severe foraminal narrowing in this area.  Also noted was L5-S1 disc protrusion with mild to moderate foraminal narrowing.  MRI of the left hip showed osteoarthritis and changes favoring subchondral insufficiency fracture versus degenerative changes without evidence of cortical breakthrough.   Patient is admitted to TRH service for further pain management and evaluation.     Assessment and Plan: Intractable left hip pain Lumbar radiculopathy Left hip subchondral insufficiency fracture Neurosurgery advised pain control, steroids (finished), no surgical intervention, and if pain persists advised epidural steroid injection left L3-L4. Orthopedic advised non weight bearing left leg. No intervention needed. Continue gabapentin . Outpatient ortho follow up.  PT worked with him, able to ambulate with rolling walker, NWB left lower extremity. Rolling walker, HH arranged. Pain better with opiates, muscle relaxants. I ordered outpatient radiology epidural steroid injections to be scheduled to let him know 5 days ahead so he can stop aspirin . New scripts for pain meds given. Advised to follow PCP, pain clinic and ortho, Neurosurgery as suggested. Patient, daughter understand and agree.   Hypertension Continue home dose amlodipine , lisinopril , propranolol    Hyperlipidemia Continue atorvastatin    Type 2 Diabetes mellitus Resumed home regimen metformin .   GERD Resume home PPI.   CAD Continue ASA, atorvastatin .   Gout Continue home allopurinol .   Depression Anxiety Continue Xanax .   Obesity class II BMI 37.31 Diet, exercise and weight reduction advised.   Consultants: Neurosurgery, orthopedic Procedures performed: none  Disposition: Home  health Diet recommendation:  Discharge Diet Orders (From admission, onward)      ROS: Per HPI  Current  Outpatient Medications:    HYDROmorphone  (DILAUDID ) 2 MG tablet, Take 1 tablet (2 mg total) by mouth every 4 (four) hours as needed for up to 7 days for severe pain (pain score 7-10)., Disp: 42 tablet, Rfl: 0   acetaminophen  (TYLENOL ) 500 MG tablet, Take 1,000 mg by mouth in the morning., Disp: , Rfl:    allopurinol  (ZYLOPRIM ) 300 MG tablet, TAKE 1 TABLET(300 MG) BY MOUTH DAILY, Disp: 90 tablet, Rfl: 1   ALPRAZolam  (XANAX ) 1 MG tablet, TAKE 1/2 TABLET(0.5 MG) BY MOUTH AT BEDTIME AS NEEDED FOR ANXIETY, Disp: 30 tablet, Rfl: 0   amLODipine  (NORVASC ) 10 MG tablet, Take 1 tablet (10 mg total) by mouth daily., Disp: 90 tablet, Rfl: 3   ascorbic acid (VITAMIN C) 500 MG tablet, Take 500 mg by mouth daily., Disp: , Rfl:    aspirin  EC (ASPIRIN  LOW DOSE) 81 MG tablet, Take 1 tablet (81 mg total) by mouth daily. Swallow whole., Disp: 90 tablet, Rfl: 3   atorvastatin  (LIPITOR) 80 MG tablet, TAKE 1 TABLET(80 MG) BY MOUTH DAILY (Patient taking differently: Take 80 mg by mouth at bedtime.), Disp: 90 tablet, Rfl: 1   cyclobenzaprine  (FLEXERIL ) 10 MG tablet, Take 1 tablet (10 mg total) by mouth 2 (two) times daily as needed for muscle spasms., Disp: 20 tablet, Rfl: 0   diphenhydrAMINE  (BENADRYL ) 25 MG tablet, Take 25 mg by mouth at bedtime., Disp: , Rfl:    ferrous sulfate  325 (65 FE) MG tablet, Take 325 mg by mouth daily., Disp: , Rfl:    gabapentin  (NEURONTIN ) 100 MG capsule, Take 1 capsule (100 mg total) by mouth 3 (three) times daily., Disp: 60 capsule, Rfl: 0   ibuprofen  (ADVIL ) 200 MG tablet, Take 400 mg by mouth at bedtime., Disp: , Rfl:    lisinopril  (ZESTRIL ) 40 MG tablet, TAKE 1 TABLET(40 MG) BY MOUTH DAILY, Disp: 90 tablet, Rfl: 3   MELATONIN PO, Take 10 mg by mouth at bedtime., Disp: , Rfl:    metFORMIN  (GLUCOPHAGE -XR) 500 MG 24 hr tablet, TAKE 2 TABLETS(1000 MG) BY MOUTH TWICE DAILY WITH A MEAL (Patient taking differently: Take 500 mg by mouth 2 (two) times daily with a meal. Pt takes two tablets twice  daily with meals), Disp: 180 tablet, Rfl: 1   Multiple Vitamin (MULTIVITAMIN WITH MINERALS) TABS tablet, Take 1 tablet by mouth daily. Men over 50, Disp: , Rfl:    omeprazole  (PRILOSEC  OTC) 20 MG tablet, Take 20 mg by mouth in the morning., Disp: , Rfl:    propranolol  (INDERAL ) 40 MG tablet, TAKE 1 TABLET(40 MG) BY MOUTH DAILY, Disp: 90 tablet, Rfl: 3  Observations/Objective: Physical Exam Constitutional:      Appearance: Normal appearance.  Pulmonary:     Effort: No respiratory distress.   Neurological:     General: No focal deficit present.     Mental Status: He is alert and oriented to person, place, and time.   Psychiatric:        Mood and Affect: Mood normal.        Behavior: Behavior normal.        Thought Content: Thought content normal.        Judgment: Judgment normal.    Assessment and Plan: Acute hip pain, left Assessment & Plan: S/P ED visit. Ongoing 8-10 pain, has completed his course of Hydromorphone  2mg  q4h PRN. Is taking Gabapentin , Flexeril ,  Tylenol  as well. Medications not sedating per patient and he is tolerating well. Plans for EBI next Thursday and pain clinic in July. Will refill 7d rx for Hydromorphone .Has been seeing PT, ambulating with walker and wheelchair and remaining NWB. Will follow up with Neuro and ortho surg after 3 weeks NWB.  Instructed to proceed to ED if symptoms worsen.    Other orders -     HYDROmorphone  HCl; Take 1 tablet (2 mg total) by mouth every 4 (four) hours as needed for up to 7 days for severe pain (pain score 7-10).  Dispense: 42 tablet; Refill: 0    Follow Up Instructions: No follow-ups on file.   I discussed the assessment and treatment plan with the patient. The patient was provided an opportunity to ask questions and all were answered. The patient agreed with the plan and demonstrated an understanding of the instructions.   The patient was advised to call back or seek an in-person evaluation if the symptoms worsen or if the  condition fails to improve as anticipated.  The above assessment and management plan was discussed with the patient. The patient verbalized understanding of and has agreed to the management plan. Patient is aware to call the clinic if symptoms persist or worsen. Patient is aware when to return to the clinic for a follow-up visit. Patient educated on when it is appropriate to go to the emergency department.   Time call ended: 1503  I provided 15 minutes of face-to-face time during this encounter.   Yolanda Hence, MSN, APRN, FNP-C [facility]

## 2023-07-13 ENCOUNTER — Ambulatory Visit (HOSPITAL_COMMUNITY)

## 2023-07-14 NOTE — Telephone Encounter (Signed)
 Please advise if verbal is approved.  Jenna w/ [facility] called to gain a verbal authorization on pt. Physical therapy Plan of care.  Plan of Care.  1 week 1 2 week 2 1 week 2   Focus on : pain management,  functional mobility include transfers, balance exercises, home exercise program,  ambulation on left lower extremity currently non wt bearing,  diabetic and medication management and pressure reduction measures.   Return call at [phone]

## 2023-07-16 ENCOUNTER — Ambulatory Visit (HOSPITAL_COMMUNITY)
Admission: RE | Admit: 2023-07-16 | Discharge: 2023-07-16 | Disposition: A | Discharge status: Home / Self Care | Source: Ambulatory Visit | Attending: Internal Medicine | Admitting: Internal Medicine

## 2023-07-16 DIAGNOSIS — M5116 Intervertebral disc disorders with radiculopathy, lumbar region: Secondary | ICD-10-CM | POA: Insufficient documentation

## 2023-07-16 DIAGNOSIS — R52 Pain, unspecified: Secondary | ICD-10-CM

## 2023-07-16 DIAGNOSIS — M5432 Sciatica, left side: Secondary | ICD-10-CM

## 2023-07-16 HISTORY — PX: IR EPIDUROGRAPHY: IMG2365

## 2023-07-16 MED ORDER — IOHEXOL 180 MG/ML  SOLN
20.0000 mL | Freq: Once | INTRAMUSCULAR | Status: AC | PRN
  Administered 2023-07-16: 5 mL via EPIDURAL

## 2023-07-16 MED ORDER — METHYLPREDNISOLONE ACETATE 40 MG/ML IJ SUSP
INTRAMUSCULAR | Status: AC
  Filled 2023-07-16: qty 1

## 2023-07-16 MED ORDER — LIDOCAINE HCL (PF) 1 % IJ SOLN
30.0000 mL | Freq: Once | INTRAMUSCULAR | Status: AC
  Administered 2023-07-16: 6 mL

## 2023-07-16 MED ORDER — STERILE WATER FOR INJECTION IJ SOLN
INTRAMUSCULAR | Status: AC
  Filled 2023-07-16: qty 10

## 2023-07-16 MED ORDER — LIDOCAINE HCL (PF) 1 % IJ SOLN
INTRAMUSCULAR | Status: AC
  Filled 2023-07-16: qty 30

## 2023-07-16 MED ORDER — IOHEXOL 180 MG/ML  SOLN
INTRAMUSCULAR | Status: AC
  Filled 2023-07-16: qty 10

## 2023-07-16 MED ORDER — METHYLPREDNISOLONE ACETATE 80 MG/ML IJ SUSP
INTRAMUSCULAR | Status: AC
  Filled 2023-07-16: qty 1

## 2023-07-16 NOTE — Telephone Encounter (Signed)
 Called  number on CRM to speak to Jeana to confirm verbal order. No answer lvm for call back.

## 2023-07-17 ENCOUNTER — Telehealth: Payer: Self-pay | Admitting: Family Medicine

## 2023-07-17 NOTE — Telephone Encounter (Unsigned)
 Copied from CRM [id number]. Topic: Clinical - Medication Refill >> Jul 17, 2023 10:21 AM DeAngela L wrote: Medication: HYDROmorphone  HCl 2 mg Oral Every 4 hours PRN  Has the patient contacted their pharmacy? Yes  (Agent: If no, request that the patient contact the pharmacy for the refill. If patient does not wish to contact the pharmacy document the reason why and proceed with request.) (Agent: If yes, when and what did the pharmacy advise?)  This is the patient's preferred pharmacy:  [id number] - [place], [place] - [address] AT [facility] OF [address] [place] [address] Phone: [phone] Fax: [phone]  Is this the correct pharmacy for this prescription? Yes  If no, delete pharmacy and type the correct one.   Has the prescription been filled recently? Yes   Is the patient out of the medication? No   Has the patient been seen for an appointment in the last year OR does the patient have an upcoming appointment? Yes   Can we respond through MyChart? Yes   Agent: Please be advised that Rx refills may take up to 3 business days. We ask that you follow-up with your pharmacy.

## 2023-07-20 DIAGNOSIS — M48061 Spinal stenosis, lumbar region without neurogenic claudication: Secondary | ICD-10-CM | POA: Diagnosis not present

## 2023-07-20 DIAGNOSIS — M1612 Unilateral primary osteoarthritis, left hip: Secondary | ICD-10-CM | POA: Diagnosis not present

## 2023-07-20 DIAGNOSIS — I2510 Atherosclerotic heart disease of native coronary artery without angina pectoris: Secondary | ICD-10-CM

## 2023-07-20 DIAGNOSIS — K219 Gastro-esophageal reflux disease without esophagitis: Secondary | ICD-10-CM

## 2023-07-20 DIAGNOSIS — K529 Noninfective gastroenteritis and colitis, unspecified: Secondary | ICD-10-CM

## 2023-07-20 DIAGNOSIS — M4856XD Collapsed vertebra, not elsewhere classified, lumbar region, subsequent encounter for fracture with routine healing: Secondary | ICD-10-CM | POA: Diagnosis not present

## 2023-07-20 DIAGNOSIS — G4700 Insomnia, unspecified: Secondary | ICD-10-CM

## 2023-07-20 DIAGNOSIS — M4726 Other spondylosis with radiculopathy, lumbar region: Secondary | ICD-10-CM | POA: Diagnosis not present

## 2023-07-20 DIAGNOSIS — I152 Hypertension secondary to endocrine disorders: Secondary | ICD-10-CM

## 2023-07-20 DIAGNOSIS — E1159 Type 2 diabetes mellitus with other circulatory complications: Secondary | ICD-10-CM

## 2023-07-20 DIAGNOSIS — G894 Chronic pain syndrome: Secondary | ICD-10-CM

## 2023-07-20 DIAGNOSIS — M438X6 Other specified deforming dorsopathies, lumbar region: Secondary | ICD-10-CM

## 2023-07-20 NOTE — Telephone Encounter (Signed)
 Second attempt to contact number on file. No answer. No voice mail to leave message.

## 2023-07-21 ENCOUNTER — Ambulatory Visit: Admitting: Orthopaedic Surgery

## 2023-07-21 DIAGNOSIS — M1612 Unilateral primary osteoarthritis, left hip: Secondary | ICD-10-CM | POA: Diagnosis not present

## 2023-07-21 MED ORDER — DICLOFENAC SODIUM 75 MG PO TBEC: 75.0000 mg | DELAYED_RELEASE_TABLET | Freq: Two times a day (BID) | ORAL | 2 refills | Status: DC
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
## 2023-07-23 ENCOUNTER — Ambulatory Visit: Admitting: Family Medicine

## 2023-07-23 NOTE — Telephone Encounter (Signed)
 Jenna spoken to by phone.  Verbal order confirmed.

## 2023-08-05 LAB — HM DIABETES EYE EXAM

## 2023-08-18 ENCOUNTER — Other Ambulatory Visit: Payer: Self-pay | Admitting: Family Medicine

## 2023-08-19 ENCOUNTER — Ambulatory Visit (HOSPITAL_BASED_OUTPATIENT_CLINIC_OR_DEPARTMENT_OTHER): Attending: Orthopaedic Surgery | Admitting: Physical Therapy

## 2023-08-19 ENCOUNTER — Other Ambulatory Visit: Payer: Self-pay

## 2023-08-19 ENCOUNTER — Encounter (HOSPITAL_BASED_OUTPATIENT_CLINIC_OR_DEPARTMENT_OTHER): Payer: Self-pay | Admitting: Physical Therapy

## 2023-08-19 DIAGNOSIS — G8929 Other chronic pain: Secondary | ICD-10-CM | POA: Diagnosis present

## 2023-08-19 DIAGNOSIS — M25552 Pain in left hip: Secondary | ICD-10-CM | POA: Insufficient documentation

## 2023-08-19 DIAGNOSIS — M1612 Unilateral primary osteoarthritis, left hip: Secondary | ICD-10-CM | POA: Insufficient documentation

## 2023-08-19 DIAGNOSIS — M6281 Muscle weakness (generalized): Secondary | ICD-10-CM | POA: Insufficient documentation

## 2023-08-19 DIAGNOSIS — M5459 Other low back pain: Secondary | ICD-10-CM | POA: Insufficient documentation
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
## 2023-08-25 ENCOUNTER — Encounter (HOSPITAL_BASED_OUTPATIENT_CLINIC_OR_DEPARTMENT_OTHER)

## 2023-09-01 ENCOUNTER — Encounter (HOSPITAL_BASED_OUTPATIENT_CLINIC_OR_DEPARTMENT_OTHER): Admitting: Physical Therapy

## 2023-09-09 ENCOUNTER — Ambulatory Visit (HOSPITAL_BASED_OUTPATIENT_CLINIC_OR_DEPARTMENT_OTHER): Admitting: Physical Therapy

## 2023-09-11 ENCOUNTER — Other Ambulatory Visit: Payer: Self-pay | Admitting: Family Medicine

## 2023-09-16 ENCOUNTER — Encounter (HOSPITAL_BASED_OUTPATIENT_CLINIC_OR_DEPARTMENT_OTHER): Admitting: Physical Therapy

## 2023-09-23 ENCOUNTER — Ambulatory Visit (HOSPITAL_BASED_OUTPATIENT_CLINIC_OR_DEPARTMENT_OTHER): Admitting: Physical Therapy

## 2023-09-25 ENCOUNTER — Ambulatory Visit (HOSPITAL_BASED_OUTPATIENT_CLINIC_OR_DEPARTMENT_OTHER): Admitting: Physical Therapy

## 2023-09-28 ENCOUNTER — Other Ambulatory Visit: Payer: Self-pay

## 2023-09-28 ENCOUNTER — Other Ambulatory Visit

## 2023-09-28 DIAGNOSIS — E785 Hyperlipidemia, unspecified: Secondary | ICD-10-CM | POA: Diagnosis not present

## 2023-09-28 DIAGNOSIS — E119 Type 2 diabetes mellitus without complications: Secondary | ICD-10-CM

## 2023-09-28 DIAGNOSIS — R7989 Other specified abnormal findings of blood chemistry: Secondary | ICD-10-CM | POA: Diagnosis not present

## 2023-09-28 DIAGNOSIS — M109 Gout, unspecified: Secondary | ICD-10-CM | POA: Diagnosis not present

## 2023-09-28 DIAGNOSIS — Z1329 Encounter for screening for other suspected endocrine disorder: Secondary | ICD-10-CM | POA: Diagnosis not present

## 2023-09-28 DIAGNOSIS — Z125 Encounter for screening for malignant neoplasm of prostate: Secondary | ICD-10-CM

## 2023-09-28 DIAGNOSIS — E038 Other specified hypothyroidism: Secondary | ICD-10-CM | POA: Diagnosis not present

## 2023-09-29 ENCOUNTER — Ambulatory Visit: Payer: Self-pay | Admitting: Family Medicine

## 2023-09-29 LAB — TSH: TSH: 1.08 m[IU]/L (ref 0.40–4.50)

## 2023-09-29 LAB — VITAMIN B12: Vitamin B-12: 402 pg/mL (ref 200–1100)

## 2023-09-29 LAB — URIC ACID: Uric Acid, Serum: 4.5 mg/dL (ref 4.0–8.0)

## 2023-09-30 ENCOUNTER — Ambulatory Visit (HOSPITAL_BASED_OUTPATIENT_CLINIC_OR_DEPARTMENT_OTHER): Admitting: Physical Therapy

## 2023-09-30 LAB — CBC WITH DIFFERENTIAL/PLATELET
Absolute Lymphocytes: 2243 {cells}/uL (ref 850–3900)
Absolute Monocytes: 694 {cells}/uL (ref 200–950)
Basophils Absolute: 27 {cells}/uL (ref 0–200)
Basophils Relative: 0.3 %
Eosinophils Absolute: 303 {cells}/uL (ref 15–500)
Eosinophils Relative: 3.4 %
HCT: 39.2 % (ref 38.5–50.0)
Hemoglobin: 12.8 g/dL — ABNORMAL LOW (ref 13.2–17.1)
MCH: 30.9 pg (ref 27.0–33.0)
MCHC: 32.7 g/dL (ref 32.0–36.0)
MCV: 94.7 fL (ref 80.0–100.0)
MPV: 9.2 fL (ref 7.5–12.5)
Monocytes Relative: 7.8 %
Neutro Abs: 5634 {cells}/uL (ref 1500–7800)
Neutrophils Relative %: 63.3 %
Platelets: 213 Thousand/uL (ref 140–400)
RBC: 4.14 Million/uL — ABNORMAL LOW (ref 4.20–5.80)
RDW: 12.3 % (ref 11.0–15.0)
Total Lymphocyte: 25.2 %
WBC: 8.9 Thousand/uL (ref 3.8–10.8)

## 2023-09-30 LAB — COMPLETE METABOLIC PANEL WITHOUT GFR
AG Ratio: 1.8 (calc) (ref 1.0–2.5)
ALT: 37 U/L (ref 9–46)
AST: 35 U/L (ref 10–35)
Albumin: 4.1 g/dL (ref 3.6–5.1)
Alkaline phosphatase (APISO): 62 U/L (ref 35–144)
BUN: 16 mg/dL (ref 7–25)
CO2: 23 mmol/L (ref 20–32)
Calcium: 9 mg/dL (ref 8.6–10.3)
Chloride: 104 mmol/L (ref 98–110)
Creat: 0.75 mg/dL (ref 0.70–1.35)
Globulin: 2.3 g/dL (ref 1.9–3.7)
Glucose, Bld: 102 mg/dL — ABNORMAL HIGH (ref 65–99)
Potassium: 3.8 mmol/L (ref 3.5–5.3)
Sodium: 140 mmol/L (ref 135–146)
Total Bilirubin: 0.5 mg/dL (ref 0.2–1.2)
Total Protein: 6.4 g/dL (ref 6.1–8.1)

## 2023-09-30 LAB — HEMOGLOBIN A1C
Hgb A1c MFr Bld: 5.9 % — ABNORMAL HIGH (ref <5.7)
Mean Plasma Glucose: 123 mg/dL
eAG (mmol/L): 6.8 mmol/L

## 2023-09-30 LAB — LIPID PANEL
Cholesterol: 114 mg/dL (ref <200)
HDL: 44 mg/dL (ref >=40)
LDL Cholesterol (Calc): 43 mg/dL
Non-HDL Cholesterol (Calc): 70 mg/dL (ref <130)
Total CHOL/HDL Ratio: 2.6 (calc) (ref <5.0)
Triglycerides: 209 mg/dL — ABNORMAL HIGH (ref <150)

## 2023-09-30 LAB — PSA: PSA: 0.57 ng/mL (ref <=4.00)

## 2023-10-01 ENCOUNTER — Encounter: Payer: Self-pay | Admitting: Family Medicine

## 2023-10-01 ENCOUNTER — Telehealth: Payer: Self-pay

## 2023-10-01 ENCOUNTER — Ambulatory Visit: Admitting: Family Medicine

## 2023-10-01 VITALS — BP 124/82 | HR 64 | Temp 98.1°F | Ht 70.0 in | Wt 263.0 lb

## 2023-10-01 DIAGNOSIS — M109 Gout, unspecified: Secondary | ICD-10-CM

## 2023-10-01 DIAGNOSIS — I2510 Atherosclerotic heart disease of native coronary artery without angina pectoris: Secondary | ICD-10-CM | POA: Diagnosis not present

## 2023-10-01 DIAGNOSIS — E1159 Type 2 diabetes mellitus with other circulatory complications: Secondary | ICD-10-CM

## 2023-10-01 DIAGNOSIS — K219 Gastro-esophageal reflux disease without esophagitis: Secondary | ICD-10-CM

## 2023-10-01 DIAGNOSIS — Z23 Encounter for immunization: Secondary | ICD-10-CM

## 2023-10-01 DIAGNOSIS — E785 Hyperlipidemia, unspecified: Secondary | ICD-10-CM | POA: Diagnosis not present

## 2023-10-01 DIAGNOSIS — Z7984 Long term (current) use of oral hypoglycemic drugs: Secondary | ICD-10-CM

## 2023-10-01 DIAGNOSIS — G894 Chronic pain syndrome: Secondary | ICD-10-CM | POA: Diagnosis not present

## 2023-10-01 DIAGNOSIS — E119 Type 2 diabetes mellitus without complications: Secondary | ICD-10-CM

## 2023-10-01 DIAGNOSIS — I152 Hypertension secondary to endocrine disorders: Secondary | ICD-10-CM

## 2023-10-01 DIAGNOSIS — F419 Anxiety disorder, unspecified: Secondary | ICD-10-CM

## 2023-10-01 MED ORDER — BLOOD GLUCOSE TEST VI STRP: 1.0000 | ORAL_STRIP | Freq: Three times a day (TID) | 0 refills | Status: DC

## 2023-10-01 NOTE — Assessment & Plan Note (Signed)
 Well controlled on Omeprazole  20mg  daily. Elevated HOB if needed and avoid lying down 2-3 hours after eating, avoid coffee, alcohol, chocolate, fatty foods, citrus, carbonated beverages, spicy foods, late meals, and smoking. Return to office if symptoms return or worsen and seek medical care for difficulty swallowing, bleeding, anemia, weight loss, or recurrent vomiting.

## 2023-10-01 NOTE — Telephone Encounter (Signed)
 Pt came in to request refill of this: One Touch Verio Test Strips 100S used to check glucose once a day.  PHARMACY: [id number] [address],  - [address] AT [facility] OF [address], [place] [address] Phone: [phone]  Fax: [phone]

## 2023-10-01 NOTE — Assessment & Plan Note (Signed)
 Uric acid level normal. No flare in 8+ years. Continue allopurinol .

## 2023-10-01 NOTE — Assessment & Plan Note (Signed)
 Well controlled on current regimen. Continue Amlodipine 10mg  daily, Lisinopril 40mg  daily, and propanolol 40mg  daily. Sees cardiology.  His home cuff does read approx 14 points higher than manual cuff readings.  Recommend heart healthy diet such as Mediterranean diet with whole grains, fruits, vegetable, fish, lean meats, nuts, and olive oil. Limit salt. Encouraged moderate walking, 3-5 times/week for 30-50 minutes each session. Aim for at least 150 minutes.week. Goal should be pace of 3 miles/hours, or walking 1.5 miles in 30 minutes. Avoid tobacco products. Avoid excess alcohol. Take medications as prescribed and bring medications and blood pressure log with cuff to each office visit. Seek medical care for chest pain, palpitations, shortness of breath with exertion, dizziness/lightheadedness, vision changes, recurrent headaches, or swelling of extremities. Follow up in 6 months

## 2023-10-01 NOTE — Assessment & Plan Note (Signed)
Goal LDL <70. Coronary CT revealed coronary calcium score of 1016, 97th percentile for age/sex/race matched control, mild non-obstructive CAD (25-49%). Continue Atorvastatin and ASA per cardiology.

## 2023-10-01 NOTE — Assessment & Plan Note (Signed)
 Continue Atorvastatin. I recommend consuming a heart healthy diet such as Mediterranean diet or DASH diet with whole grains, fruits, vegetable, fish, lean meats, nuts, and olive oil. Limit sweets and processed foods. I also encourage moderate intensity exercise 150 minutes weekly. This is 3-5 times weekly for 30-50 minutes each session. Goal should be pace of 3 miles/hours, or walking 1.5 miles in 30 minutes. The ASCVD Risk score (Arnett DK, et al., 2019) failed to calculate for the following reasons:   The valid total cholesterol range is 130 to 320 mg/dL

## 2023-10-01 NOTE — Assessment & Plan Note (Signed)
 Well controlled on Metformin  ER 1000mg  BID. A1c 5.9% and uACR UTD. Foot exam today. Vaccines UTD. Retinal eye exam UTD. Recommend heart healthy diet such as Mediterranean diet with whole grains, fruits, vegetable, fish, lean meats, nuts, and olive oil. Limit salt. Encouraged moderate walking, 3-5 times/week for 30-50 minutes each session. Aim for at least 150 minutes.week. Goal should be pace of 3 miles/hours, or walking 1.5 miles in 30 minutes. Seek medical care for urinary frequency, extreme thirst, vision changes, lightheadedness, dizziness.  Follow up in 6 months or sooner if needed

## 2023-10-01 NOTE — Assessment & Plan Note (Signed)
 Followed by pain management, history includes cervicalgia, cervical DDD, chronic low back pain, lumbar radiculopathy, prior L2 vertebral compression fracture, myofascial pain, left hip osteoarthritis, and chronic pain syndrome. On tramadol , tylenol , NSAIDs, considering spinal cord stimulator Encouraged to wean and discontinue xanax 
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
## 2023-10-01 NOTE — Telephone Encounter (Signed)
 Rf sent in by provider.

## 2023-10-01 NOTE — Assessment & Plan Note (Signed)
 GAD 3. Well controlled. States he takes Xanax  nightly for anxiety but does not feel like he needs it. Will work on weaning. Decrease to 0.5mg  every other day. Next fill 0.5mg  tablets.

## 2023-10-02 ENCOUNTER — Ambulatory Visit (HOSPITAL_BASED_OUTPATIENT_CLINIC_OR_DEPARTMENT_OTHER): Admitting: Physical Therapy

## 2023-10-03 ENCOUNTER — Other Ambulatory Visit: Payer: Self-pay | Admitting: Family Medicine

## 2023-10-05 ENCOUNTER — Other Ambulatory Visit: Payer: Self-pay

## 2023-10-05 ENCOUNTER — Telehealth: Payer: Self-pay

## 2023-10-05 ENCOUNTER — Ambulatory Visit (HOSPITAL_BASED_OUTPATIENT_CLINIC_OR_DEPARTMENT_OTHER): Admitting: Physical Therapy

## 2023-10-05 DIAGNOSIS — E119 Type 2 diabetes mellitus without complications: Secondary | ICD-10-CM

## 2023-10-05 MED ORDER — BLOOD GLUCOSE MONITORING SUPPL DEVI: 0 refills | Status: AC

## 2023-10-05 MED ORDER — ACCU-CHEK SOFTCLIX LANCETS MISC: 12 refills | Status: AC

## 2023-10-05 NOTE — Telephone Encounter (Signed)
 Copied from CRM #[id number]. Topic: Clinical - Medication Question >> Oct 05, 2023  8:08 AM Mesmerise C wrote: Reason for CRM: Patient stated he got the Accu check guide test strips but needs the accu check guide test machine prescription to be called in since the insurance no longer covers for the one touch machine >> Oct 05, 2023  8:27 AM Rosaria E wrote: Pt called to report that he will also need a Rx for lancets and any other testing supplies along with the meter and test strips (he already has the test strips).

## 2023-10-08 ENCOUNTER — Ambulatory Visit (HOSPITAL_BASED_OUTPATIENT_CLINIC_OR_DEPARTMENT_OTHER): Admitting: Physical Therapy

## 2023-10-30 ENCOUNTER — Other Ambulatory Visit: Payer: Self-pay | Admitting: Family Medicine

## 2023-11-03 ENCOUNTER — Other Ambulatory Visit: Payer: Self-pay | Admitting: Family Medicine

## 2023-11-10 NOTE — Telephone Encounter (Signed)
 Copied from CRM #[id number]. Topic: Clinical - Prescription Issue >> Nov 10, 2023  8:36 AM Pinkey ORN wrote: Reason for CRM: metFORMIN  (GLUCOPHAGE -XR) 500 MG 24 hr tablet  Teresa Pinkey T   11/10/2023  8:38 AM  Patient states the pharmacy informed him that his prescription has been closed out. Patient is wanting to know why? Patient is also demanding that this prescription is sent over to [id number] - [place], [place] - [address] AT [facility] OF [address] as he states he'll be there in 20 minutes to pick it up.

## 2023-11-11 ENCOUNTER — Telehealth: Payer: Self-pay

## 2023-11-11 ENCOUNTER — Other Ambulatory Visit: Payer: Self-pay | Admitting: Family Medicine

## 2023-11-11 MED ORDER — METFORMIN HCL ER 500 MG PO TB24: 1000.0000 mg | ORAL_TABLET | Freq: Two times a day (BID) | ORAL | 1 refills | Status: DC

## 2023-11-11 NOTE — Telephone Encounter (Signed)
 Copied from CRM #[id number]. Topic: Clinical - Prescription Issue >> Nov 10, 2023  8:36 AM Pinkey ORN wrote: Reason for CRM: metFORMIN  (GLUCOPHAGE -XR) 500 MG 24 hr tablet >> Nov 11, 2023  8:11 AM Pinkey ORN wrote: Patient is calling this morning in regards to his metFORMIN  (GLUCOPHAGE -XR) 500 MG 24 hr tablet, wanting to know rather or not it can be refilled. Please follow up with patient.  >> Nov 10, 2023  8:38 AM Pinkey ORN wrote: Patient states the pharmacy informed him that his prescription has been closed out. Patient is wanting to know why? Patient is also demanding that this prescription is sent over to [id number] - [place], [place] - [address] AT [facility] OF [address] as he states he'll be there in 20 minutes to pick it up.

## 2023-11-18 ENCOUNTER — Ambulatory Visit: Payer: Self-pay

## 2023-11-18 ENCOUNTER — Ambulatory Visit: Admitting: Family Medicine

## 2023-11-18 ENCOUNTER — Encounter: Payer: Self-pay | Admitting: Family Medicine

## 2023-11-18 VITALS — BP 133/85 | HR 67 | Temp 98.0°F | Ht 70.0 in | Wt 262.8 lb

## 2023-11-18 DIAGNOSIS — B9689 Other specified bacterial agents as the cause of diseases classified elsewhere: Secondary | ICD-10-CM

## 2023-11-18 DIAGNOSIS — J069 Acute upper respiratory infection, unspecified: Secondary | ICD-10-CM | POA: Insufficient documentation

## 2023-11-18 DIAGNOSIS — J019 Acute sinusitis, unspecified: Secondary | ICD-10-CM | POA: Diagnosis not present

## 2023-11-18 MED ORDER — HYDROCODONE BIT-HOMATROP MBR 5-1.5 MG/5ML PO SOLN: 5.0000 mL | Freq: Three times a day (TID) | ORAL | 0 refills | Status: DC | PRN

## 2023-11-18 MED ORDER — ALBUTEROL SULFATE HFA 108 (90 BASE) MCG/ACT IN AERS: 2.0000 | INHALATION_SPRAY | Freq: Four times a day (QID) | RESPIRATORY_TRACT | 0 refills | Status: DC | PRN

## 2023-11-18 MED ORDER — AMOXICILLIN-POT CLAVULANATE 875-125 MG PO TABS: 1.0000 | ORAL_TABLET | Freq: Two times a day (BID) | ORAL | 0 refills | Status: DC

## 2023-11-18 NOTE — Telephone Encounter (Signed)
 Pt. Being seen in office today.

## 2023-11-18 NOTE — Telephone Encounter (Signed)
 FYI Only or Action Required?: FYI only for provider: appointment scheduled on 10/29.  Patient was last seen in primary care on 10/01/2023 by David Jeoffrey RAMAN, FNP.  Called Nurse Triage reporting Cough.  Symptoms began several weeks ago.  Interventions attempted: OTC medications: Robitussin, Tylenol  Cough, Tylenol , and Ibuprofen .  Symptoms are: gradually worsening.  Triage Disposition: See HCP Within 4 Hours (Or PCP Triage)  Patient/caregiver understands and will follow disposition?: Yes  Copied from CRM [id number]. Topic: Clinical - Red Word Triage >> Nov 18, 2023  9:17 AM Tobias L wrote: Red Word that prompted transfer to Nurse Triage:coughing up yellow, congestion in chest, low grade fever Reason for Disposition  Wheezing is present  Answer Assessment - Initial Assessment Questions 1. ONSET: When did the cough begin?      2 weeks ago  2. SEVERITY: How bad is the cough today?      Getting worse in the evening  3. SPUTUM: Describe the color of your sputum (e.g., none, dry cough; clear, white, yellow, green)     Yellow sputum  4. HEMOPTYSIS: Are you coughing up any blood? If Yes, ask: How much? (e.g., flecks, streaks, tablespoons, etc.)     No  5. DIFFICULTY BREATHING: Are you having difficulty breathing? If Yes, ask: How bad is it? (e.g., mild, moderate, severe)      No shortness of breath, but can hear self wheezng  6. FEVER: Do you have a fever? If Yes, ask: What is your temperature, how was it measured, and when did it start?     Has low grade fever  7. CARDIAC HISTORY: Do you have any history of heart disease? (e.g., heart attack, congestive heart failure)      No  8. LUNG HISTORY: Do you have any history of lung disease?  (e.g., pulmonary embolus, asthma, emphysema)     Has had several case  9. PE RISK FACTORS: Do you have a history of blood clots? (or: recent major surgery, recent prolonged travel, bedridden)     No  10. OTHER SYMPTOMS: Do  you have any other symptoms? (e.g., runny nose, wheezing, chest pain)       Sore throat, sneezing, wheezing, and chest congestion  11. PREGNANCY: Is there any chance you are pregnant? When was your last menstrual period?       No  12. TRAVEL: Have you traveled out of the country in the last month? (e.g., travel history, exposures)       No  Protocols used: Cough - Acute Productive-A-AH

## 2023-11-18 NOTE — Assessment & Plan Note (Signed)
 Assessment and Plan Assessment & Plan Acute upper respiratory infection with acute sinusitis Symptoms suggest viral etiology with possible bacterial sinusitis due to prolonged symptoms and purulent discharge. Differential includes viral URI and bacterial sinusitis. - Prescribed Augmentin  for bacterial sinusitis. - Switched to Mucinex  for chest congestion. - Encouraged hydration with fluids. - Advised Flonase for nasal congestion. - Ordered viral testing for COVID-19, influenza, and RSV. - Prescribed hycodan cough medicine for nighttime use. - Prescribed albuterol  inhaler for use if wheezing or shortness of breath occurs. - Advised against using Xanax  or tramadol  with hycodan. - Follow up if symptoms persist or worsen
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
## 2023-11-20 LAB — SARS-COV-2 RNA, INFLUENZA A/B, AND RSV RNA, QUALITATIVE NAAT
INFLUENZA A RNA: NOT DETECTED
INFLUENZA B RNA: NOT DETECTED
RSV RNA: NOT DETECTED
SARS COV2 RNA: NOT DETECTED

## 2023-11-23 ENCOUNTER — Encounter: Payer: Self-pay | Admitting: Radiology

## 2023-11-23 ENCOUNTER — Encounter: Payer: Self-pay | Admitting: Family Medicine

## 2023-12-03 ENCOUNTER — Encounter

## 2023-12-09 ENCOUNTER — Telehealth: Payer: Self-pay

## 2023-12-09 ENCOUNTER — Ambulatory Visit

## 2023-12-09 VITALS — Ht 70.0 in | Wt 262.0 lb

## 2023-12-09 DIAGNOSIS — Z0000 Encounter for general adult medical examination without abnormal findings: Secondary | ICD-10-CM | POA: Diagnosis not present
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
## 2023-12-09 NOTE — Patient Instructions (Signed)
 David Hartman,  Thank you for taking the time for your Medicare Wellness Visit. I appreciate your continued commitment to your health goals. Please review the care plan we discussed, and feel free to reach out if I can assist you further.  Please note that Annual Wellness Visits do not include a physical exam. Some assessments may be limited, especially if the visit was conducted virtually. If needed, we may recommend an in-person follow-up with your provider.  Ongoing Care Seeing your primary care provider every 3 to 6 months helps us  monitor your health and provide consistent, personalized care.   Referrals If a referral was made during today's visit and you haven't received any updates within two weeks, please contact the referred provider directly to check on the status.  Recommended Screenings:  Health Maintenance  Topic Date Due   Hepatitis C Screening  Never done   Eye exam for diabetics  06/27/2023   Hemoglobin A1C  03/27/2024   Yearly kidney health urinalysis for diabetes  03/31/2024   Yearly kidney function blood test for diabetes  09/27/2024   Complete foot exam   09/30/2024   Medicare Annual Wellness Visit  12/08/2024   DTaP/Tdap/Td vaccine (3 - Td or Tdap) 09/09/2025   Colon Cancer Screening  04/04/2026   Pneumococcal Vaccine for age over 26  Completed   Flu Shot  Completed   HIV Screening  Completed   Hepatitis B Vaccine  Aged Out   HPV Vaccine  Aged Out   Meningitis B Vaccine  Aged Out   COVID-19 Vaccine  Discontinued   Zoster (Shingles) Vaccine  Discontinued       12/09/2023    9:44 AM  Advanced Directives  Does Patient Have a Medical Advance Directive? No  Would patient like information on creating a medical advance directive? Yes (MAU/Ambulatory/Procedural Areas - Information given)   Information on Advanced Care Planning can be found at [place]  Secretary of [facility] Advance Health Care Directives Advance Health Care Directives ([email or web address]   Vision:  Annual vision screenings are recommended for early detection of glaucoma, cataracts, and diabetic retinopathy. These exams can also reveal signs of chronic conditions such as diabetes and high blood pressure.  Dental: Annual dental screenings help detect early signs of oral cancer, gum disease, and other conditions linked to overall health, including heart disease and diabetes.  Please see the attached documents for additional preventive care recommendations.

## 2023-12-09 NOTE — Telephone Encounter (Signed)
 Patient seen for AWV and is asking if rx for Alprazolam  will be changed to 0.5mg  instead of 1mg .  He is currently breaking 1mg  in half.  States that it was discussed at a previous visit. Would like for new rx to be sent to pharmacy if approved.

## 2023-12-10 ENCOUNTER — Other Ambulatory Visit: Payer: Self-pay | Admitting: Family Medicine

## 2023-12-10 MED ORDER — ALPRAZOLAM 0.5 MG PO TABS: 0.5000 mg | ORAL_TABLET | Freq: Every day | ORAL | 0 refills | Status: DC | PRN

## 2023-12-30 ENCOUNTER — Ambulatory Visit: Admitting: Family Medicine

## 2023-12-30 ENCOUNTER — Ambulatory Visit: Payer: Self-pay

## 2023-12-30 ENCOUNTER — Encounter: Payer: Self-pay | Admitting: Family Medicine

## 2023-12-30 VITALS — BP 134/78 | HR 63 | Temp 97.7°F | Ht 70.0 in | Wt 268.8 lb

## 2023-12-30 DIAGNOSIS — R319 Hematuria, unspecified: Secondary | ICD-10-CM

## 2023-12-30 DIAGNOSIS — R10A0 Flank pain, unspecified side: Secondary | ICD-10-CM

## 2023-12-30 LAB — COMPREHENSIVE METABOLIC PANEL WITH GFR
AG Ratio: 1.9 (calc) (ref 1.0–2.5)
ALT: 53 U/L — ABNORMAL HIGH (ref 9–46)
AST: 40 U/L — ABNORMAL HIGH (ref 10–35)
Albumin: 4.1 g/dL (ref 3.6–5.1)
Alkaline phosphatase (APISO): 74 U/L (ref 35–144)
BUN: 12 mg/dL (ref 7–25)
CO2: 24 mmol/L (ref 20–32)
Calcium: 9 mg/dL (ref 8.6–10.3)
Chloride: 104 mmol/L (ref 98–110)
Creat: 0.75 mg/dL (ref 0.70–1.35)
Globulin: 2.2 g/dL (ref 1.9–3.7)
Glucose, Bld: 120 mg/dL — ABNORMAL HIGH (ref 65–99)
Potassium: 4 mmol/L (ref 3.5–5.3)
Sodium: 139 mmol/L (ref 135–146)
Total Bilirubin: 0.5 mg/dL (ref 0.2–1.2)
Total Protein: 6.3 g/dL (ref 6.1–8.1)
eGFR: 102 mL/min/1.73m2 (ref >=60)

## 2023-12-30 LAB — CBC WITH DIFFERENTIAL/PLATELET
Absolute Lymphocytes: 2528 {cells}/uL (ref 850–3900)
Absolute Monocytes: 685 {cells}/uL (ref 200–950)
Basophils Absolute: 27 {cells}/uL (ref 0–200)
Basophils Relative: 0.3 %
Eosinophils Absolute: 240 {cells}/uL (ref 15–500)
Eosinophils Relative: 2.7 %
HCT: 38 % — ABNORMAL LOW (ref 39.4–51.1)
Hemoglobin: 12.5 g/dL — ABNORMAL LOW (ref 13.2–17.1)
MCH: 30.5 pg (ref 27.0–33.0)
MCHC: 32.9 g/dL (ref 31.6–35.4)
MCV: 92.7 fL (ref 81.4–101.7)
MPV: 9.7 fL (ref 7.5–12.5)
Monocytes Relative: 7.7 %
Neutro Abs: 5420 {cells}/uL (ref 1500–7800)
Neutrophils Relative %: 60.9 %
Platelets: 226 Thousand/uL (ref 140–400)
RBC: 4.1 Million/uL — ABNORMAL LOW (ref 4.20–5.80)
RDW: 12.7 % (ref 11.0–15.0)
Total Lymphocyte: 28.4 %
WBC: 8.9 Thousand/uL (ref 3.8–10.8)

## 2023-12-30 LAB — URINALYSIS, ROUTINE W REFLEX MICROSCOPIC
Bacteria, UA: NONE SEEN /HPF
Bilirubin Urine: NEGATIVE
Glucose, UA: NEGATIVE
Hyaline Cast: NONE SEEN /LPF
Ketones, ur: NEGATIVE
Leukocytes,Ua: NEGATIVE
Nitrite: NEGATIVE
Protein, ur: NEGATIVE
Specific Gravity, Urine: 1.01 (ref 1.001–1.035)
Squamous Epithelial / HPF: NONE SEEN /HPF (ref <=5)
WBC, UA: NONE SEEN /HPF (ref 0–5)
pH: 6 (ref 5.0–8.0)

## 2023-12-30 LAB — MICROSCOPIC MESSAGE
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
## 2023-12-30 NOTE — Telephone Encounter (Signed)
 FYI Only or Action Required?: FYI only for provider: appointment scheduled on 12/30/23.  Patient was last seen in primary care on 11/18/2023 by Kayla Jeoffrey RAMAN, FNP.  Called Nurse Triage reporting Hematuria and Back Pain.  Symptoms began several days ago.  Interventions attempted: drinking fluids.  Symptoms are: gradually worsening.  Triage Disposition: See Physician Within 24 Hours  Patient/caregiver understands and will follow disposition?: Yes            Copied from CRM #[id number]. Topic: Clinical - Red Word Triage >> Dec 30, 2023  8:15 AM Larissa RAMAN wrote: [facility] that prompted transfer to Nurse Triage: Blood in urine, low back pain Reason for Disposition  Side (flank) or back pain present  Answer Assessment - Initial Assessment Questions 1. COLOR of URINE: Describe the color of the urine.  (e.g., tea-colored, pink, red, bloody) Do you have blood clots in your urine? (e.g., none, pea, grape, small coin)     Urine was yellow to brown but this morning urine was red with a small blood clot.  2. ONSET: When did the bleeding start?      Friday.  3. EPISODES: How many times has there been blood in the urine? or How many times today?     He states his urine was looking brown prior; 1 episode this morning with a small blood clot about 1/2 inch.  4. PAIN with URINATION: Is there any pain with passing your urine? If Yes, ask: How bad is the pain?  (Scale 1-10; or mild, moderate, severe)     No pain or burning with passing urine. Dull pain to right lower back, moderate with certain positioning.  5. FEVER: Do you have a fever? If Yes, ask: What is your temperature, how was it measured, and when did it start?     No.  6. ASSOCIATED SYMPTOMS: Are you passing urine more frequently than usual?     Not sure.  7. OTHER SYMPTOMS: Do you have any other symptoms? (e.g., back/flank pain, abdomen pain, vomiting)     No nausea or vomiting, fever.  Protocols used:  Urine - Blood In-A-AH

## 2024-01-04 ENCOUNTER — Ambulatory Visit: Payer: Self-pay | Admitting: Family Medicine

## 2024-01-05 ENCOUNTER — Encounter: Payer: Self-pay | Admitting: Emergency Medicine

## 2024-01-05 ENCOUNTER — Ambulatory Visit: Attending: Emergency Medicine | Admitting: Emergency Medicine

## 2024-01-05 VITALS — BP 112/66 | HR 64 | Ht 70.0 in | Wt 258.0 lb

## 2024-01-05 DIAGNOSIS — E785 Hyperlipidemia, unspecified: Secondary | ICD-10-CM

## 2024-01-05 DIAGNOSIS — G4733 Obstructive sleep apnea (adult) (pediatric): Secondary | ICD-10-CM

## 2024-01-05 DIAGNOSIS — I440 Atrioventricular block, first degree: Secondary | ICD-10-CM

## 2024-01-05 DIAGNOSIS — E119 Type 2 diabetes mellitus without complications: Secondary | ICD-10-CM

## 2024-01-05 DIAGNOSIS — I152 Hypertension secondary to endocrine disorders: Secondary | ICD-10-CM

## 2024-01-05 DIAGNOSIS — I2510 Atherosclerotic heart disease of native coronary artery without angina pectoris: Secondary | ICD-10-CM

## 2024-01-05 NOTE — Patient Instructions (Signed)
 Medication Instructions:  NO CHANGES.  Lab Work: NONE TO BE DONE TO TODAY.  Testing/Procedures: NONE  Follow-Up: At [facility], you and your health needs are our priority.  As part of our continuing mission to provide you with exceptional heart care, our providers are all part of one team.  This team includes your primary Cardiologist (physician) and Advanced Practice Providers or APPs (Physician Assistants and Nurse Practitioners) who all work together to provide you with the care you need, when you need it.  Your next appointment:   1 YEAR  Provider:   Stanly DELENA Leavens, MD

## 2024-01-05 NOTE — Progress Notes (Signed)
 Cardiology Office Note:    Date:  01/05/2024  ID:  David Hartman, DOB [birth year], MRN [id number] PCP: Kayla Jeoffrey RAMAN, FNP  [facility] Providers Cardiologist:  Stanly DELENA Leavens, MD Cardiology APP:  Rana Lum CROME, NP       Patient Profile:       Chief Complaint: 98-month follow-up History of Present Illness:  David Hartman is a 63 y.o. male with visit-pertinent history of hypertension, hyperlipidemia, coronary artery disease, morbid obesity, chronic back pain, IDA, sinus bradycardia first HB, T2DM   Previously seen by Dr. Maranda for chest pain felt to be MSK when normal stress echo.  Follow-up in 2017 echocardiogram 2017 showed normal LV function with grade 2 DD.   He reestablished care with cardiology on 02/15/2021 for chest pain.  He had prior negative stress echo 2014.  Normal nuclear stress test 07/2016 for surgical clearance.  Coronary CTA was ordered which revealed coronary calcium  score of 1016 (97th percentile).  He was advised to increase statin intensity with atorvastatin  80 mg and Zetia 10 mg daily and start aspirin  81 mg daily.   He was admitted 6/10 through 07/01/2022 for chest pain.  He was working on his trailer for 4 hours and when he got out he had pain across the entire chest.  Seen by cardiology.  Troponin negative x 2 and unremarkable EKG.  Chest x-ray and CT chest negative for acute pathology.  His TTE was normal.  Suspicion for MSK pain.   Seen in office on 12/29/2022.  Patient's blood pressure was elevated in office.  Noted his home cuff reads 15 mmHg higher.  Nonpharmacological approach was taken.  He was to monitor his blood pressure at home and increase cardiovascular exercise.  He was to follow-up in 4-5 months.  Last seen in clinic on 06/18/2023.  Blood pressure is well-controlled and he recently joined a gym and maintain physical exercise weekly.  He is without anginal symptoms.  He was to follow-up in 6 months.   Discussed the use of AI  scribe software for clinical note transcription with the patient, who gave verbal consent to proceed.  History of Present Illness David Hartman is a 63 year old male with coronary artery disease who presents for a cardiovascular follow-up.  From a cardiovascular standpoint he feels well. He has no chest pain, dyspnea or any exertional symptoms.  Home blood pressure readings have been stable.  He takes amlodipine , lisinopril , propranolol , atorvastatin , and aspirin .  He recently had hematuria with clots and flank pain that felt different from prior kidney stones. The urine has returned to light yellow or clear. He is awaiting abdominal and pelvic CT and a urology visit.  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  He had a hip problem in June that was ultimately diagnosed as arthritis. He has resumed light gym activity and fishing. He has gained some weight over the holidays and plans to lose 25 pounds.  He has mild sleep apnea but declined CPAP because of discomfort and is focusing on weight loss.  His A1c improved from 6.3 to 5.9, and he stays active with his grandchildren and through fishing.  He denies chest pains, or dyspnea, orthopnea, PND, LEE, palpitations, syncope, presyncope, lightheadedness, dizziness, palpitations   Review of systems:  Please see the history of present illness. All other systems are reviewed and otherwise negative.  Studies Reviewed:    EKG Interpretation Date/Time:  Tuesday January 05 2024 09:34:29 EST Ventricular Rate:  64 PR Interval:  234 QRS Duration:  82 QT Interval:  412 QTC Calculation: 425 R Axis:   62  Text Interpretation: Sinus rhythm with 1st degree A-V block When compared with ECG of 30-Jun-2022 19:20, PREVIOUS ECG IS PRESENT Confirmed by Rana Dixon [id number]) on 01/05/2024 1:02:25 PM    Echocardiogram 07/01/2022  1. Left  ventricular ejection fraction, by estimation, is 60 to 65%. The  left ventricle has normal function. The left ventricle has no regional  wall motion abnormalities. Left ventricular diastolic parameters were  normal.   2. Right ventricular systolic function is normal. The right ventricular  size is normal. Tricuspid regurgitation signal is inadequate for assessing  PA pressure.   3. The mitral valve is grossly normal. Trivial mitral valve  regurgitation. No evidence of mitral stenosis.   4. Focal calcification noted in the aortic root above the NCC. The aortic  valve is tricuspid. Aortic valve regurgitation is not visualized. Aortic  valve sclerosis is present, with no evidence of aortic valve stenosis.   5. The inferior vena cava is normal in size with greater than 50%  respiratory variability, suggesting right atrial pressure of 3 mmHg.    Coronary CTA 03/07/2021 1. Coronary calcium  score of 1016. This was 97th percentile for age, sex, and race matched control.   2. Normal coronary origin with left dominance.   3. Aortic atherosclerosis.   4. CAD-RADS 2. Mild non-obstructive CAD (25-49%). Consider non-atherosclerotic causes of chest pain. Consider preventive  Risk Assessment/Calculations:              Physical Exam:   VS:  BP 112/66 (BP Location: Left Arm, Patient Position: Sitting, Cuff Size: Large)   Pulse 64   Ht 5' 10 (1.778 m)   Wt 258 lb (117 kg)   BMI 37.02 kg/m    Wt Readings from Last 3 Encounters:  01/05/24 258 lb (117 kg)  12/30/23 268 lb 12.8 oz (121.9 kg)  12/09/23 262 lb (118.8 kg)    GEN: Well nourished, well developed in no acute distress NECK: No JVD; No carotid bruits CARDIAC: RRR, no murmurs, rubs, gallops RESPIRATORY:  Clear to auscultation without rales, wheezing or rhonchi  ABDOMEN: Soft, non-tender, non-distended EXTREMITIES:  No edema; No acute deformity      Assessment and Plan:  Hypertension Blood pressure today 112/66 and  well-controlled - Continue amlodipine  10 mg, propranolol  40 mg daily, and lisinopril  40 mg daily   Coronary artery disease Coronary CTA 02/2021 showed coronary calcium  score of 1016 (96th percentile) with mild nonobstructive CAD (25-49%) - Today patient is stable without anginal symptoms.  Remains fairly active without exertional chest pains or dyspnea.  No indication for further ischemic evaluation at this time - Encouraged daily physical exercise and heart healthy dieting - Continue aspirin  81 mg daily and atorvastatin  80 mg daily   Sinus bradycardia with 1st HB HR today 64 bpm Denies any syncope, presyncope, lightheadedness, dizziness - No evidence of high-grade AV block - Continue to clinically monitor   Mild obstructive sleep apnea Sleep study 09/2022 demonstrated mild OSA with total AHI of 12.6/hour - It was recommended for him to trial CPAP therapy but he deferred at that time - Offered repeat sleep study today however he deferred and prefers to work on weight loss at this time   Hyperlipidemia, LDL goal <70 LDL 43 on 09/2023 and well-controlled - Continue  atorvastatin  80 mg daily   Type 2 diabetes A1c 5.9% on 09/2023 and well-controlled - Managed on metformin  by PCP      Dispo:  Return in about 1 year (around 01/04/2025).  Signed, Lum LITTIE Louis, NP

## 2024-01-08 ENCOUNTER — Inpatient Hospital Stay: Admission: RE | Admit: 2024-01-08

## 2024-01-08 DIAGNOSIS — R319 Hematuria, unspecified: Secondary | ICD-10-CM

## 2024-01-08 MED ORDER — IOPAMIDOL (ISOVUE-300) INJECTION 61%
100.0000 mL | Freq: Once | INTRAVENOUS | Status: AC | PRN
  Administered 2024-01-08: 100 mL via INTRAVENOUS

## 2024-01-11 ENCOUNTER — Other Ambulatory Visit: Payer: Self-pay

## 2024-01-11 ENCOUNTER — Ambulatory Visit: Payer: Self-pay

## 2024-01-11 DIAGNOSIS — R319 Hematuria, unspecified: Secondary | ICD-10-CM

## 2024-01-11 DIAGNOSIS — N200 Calculus of kidney: Secondary | ICD-10-CM

## 2024-01-11 DIAGNOSIS — R10A0 Flank pain, unspecified side: Secondary | ICD-10-CM

## 2024-01-11 NOTE — Telephone Encounter (Signed)
 Results have not been resulted by provider. Please reach out with results   Message from Kevelyn M sent at 01/11/2024  8:08 AM EST  Reason for Triage: Patient had a CT scan Friday and test results are back. It says he has gallstones and kidney stones. The bleeding in urine and has stopped but would like discuss results. No appointments available until January. Scheduled patient for January appointment but patient is concerned does not want to wait until then. Please advise patient.   Call back #  [phone]

## 2024-01-12 NOTE — Telephone Encounter (Signed)
 Copied from CRM #[id number]. Topic: Referral - Status >> Jan 12, 2024  1:26 PM Pinkey ORN wrote: Reason for CRM: Urology

## 2024-01-13 ENCOUNTER — Emergency Department (HOSPITAL_COMMUNITY)

## 2024-01-13 ENCOUNTER — Emergency Department (HOSPITAL_COMMUNITY)
Admission: EM | Admit: 2024-01-13 | Discharge: 2024-01-14 | Disposition: A | Discharge status: Home / Self Care | Attending: Emergency Medicine | Admitting: Emergency Medicine

## 2024-01-13 ENCOUNTER — Telehealth: Payer: Self-pay

## 2024-01-13 ENCOUNTER — Other Ambulatory Visit: Payer: Self-pay

## 2024-01-13 DIAGNOSIS — N200 Calculus of kidney: Secondary | ICD-10-CM

## 2024-01-13 DIAGNOSIS — D72829 Elevated white blood cell count, unspecified: Secondary | ICD-10-CM | POA: Insufficient documentation

## 2024-01-13 DIAGNOSIS — Z7982 Long term (current) use of aspirin: Secondary | ICD-10-CM | POA: Insufficient documentation

## 2024-01-13 DIAGNOSIS — N132 Hydronephrosis with renal and ureteral calculous obstruction: Secondary | ICD-10-CM | POA: Diagnosis not present

## 2024-01-13 DIAGNOSIS — R109 Unspecified abdominal pain: Secondary | ICD-10-CM | POA: Diagnosis present

## 2024-01-13 LAB — CBC
HCT: 41.2 % (ref 39.0–52.0)
Hemoglobin: 13.5 g/dL (ref 13.0–17.0)
MCH: 30.5 pg (ref 26.0–34.0)
MCHC: 32.8 g/dL (ref 30.0–36.0)
MCV: 93 fL (ref 80.0–100.0)
Platelets: 222 K/uL (ref 150–400)
RBC: 4.43 MIL/uL (ref 4.22–5.81)
RDW: 13 % (ref 11.5–15.5)
WBC: 11.2 K/uL — ABNORMAL HIGH (ref 4.0–10.5)
nRBC: 0 % (ref 0.0–0.2)

## 2024-01-13 LAB — BASIC METABOLIC PANEL WITH GFR
Anion gap: 11 (ref 5–15)
BUN: 21 mg/dL (ref 8–23)
CO2: 26 mmol/L (ref 22–32)
Calcium: 10 mg/dL (ref 8.9–10.3)
Chloride: 105 mmol/L (ref 98–111)
Creatinine, Ser: 0.97 mg/dL (ref 0.61–1.24)
GFR, Estimated: >60 mL/min
Glucose, Bld: 151 mg/dL — ABNORMAL HIGH (ref 70–99)
Potassium: 4.1 mmol/L (ref 3.5–5.1)
Sodium: 142 mmol/L (ref 135–145)

## 2024-01-13 LAB — URINALYSIS, ROUTINE W REFLEX MICROSCOPIC
Bilirubin Urine: NEGATIVE
Glucose, UA: NEGATIVE mg/dL
Ketones, ur: NEGATIVE mg/dL
Leukocytes,Ua: NEGATIVE
Nitrite: NEGATIVE
Protein, ur: 30 mg/dL — AB
RBC / HPF: >50 RBC/hpf (ref 0–5)
Specific Gravity, Urine: 1.018 (ref 1.005–1.030)
pH: 5 (ref 5.0–8.0)

## 2024-01-13 MED ORDER — ONDANSETRON 4 MG PO TBDP: 4.0000 mg | ORAL_TABLET | Freq: Three times a day (TID) | ORAL | 0 refills | Status: AC | PRN

## 2024-01-13 MED ORDER — OXYCODONE HCL 5 MG PO TABS
2.5000 mg | ORAL_TABLET | Freq: Once | ORAL | Status: AC
  Administered 2024-01-13: 2.5 mg via ORAL
  Filled 2024-01-13: qty 1

## 2024-01-13 MED ORDER — KETOROLAC TROMETHAMINE 15 MG/ML IJ SOLN
15.0000 mg | Freq: Once | INTRAMUSCULAR | Status: AC
  Administered 2024-01-13: 15 mg via INTRAVENOUS
  Filled 2024-01-13: qty 1

## 2024-01-13 MED ORDER — LACTATED RINGERS IV BOLUS
1000.0000 mL | Freq: Once | INTRAVENOUS | Status: AC
  Administered 2024-01-13: 1000 mL via INTRAVENOUS

## 2024-01-13 MED ORDER — MORPHINE SULFATE (PF) 4 MG/ML IV SOLN
4.0000 mg | Freq: Once | INTRAVENOUS | Status: DC
  Filled 2024-01-13: qty 1

## 2024-01-13 MED ORDER — TRAMADOL HCL 50 MG PO TABS: 50.0000 mg | ORAL_TABLET | Freq: Three times a day (TID) | ORAL | 0 refills | Status: AC | PRN

## 2024-01-13 MED ORDER — TAMSULOSIN HCL 0.4 MG PO CAPS: 0.4000 mg | ORAL_CAPSULE | Freq: Every day | ORAL | 0 refills | Status: AC

## 2024-01-13 MED ORDER — CELECOXIB 100 MG PO CAPS: 100.0000 mg | ORAL_CAPSULE | Freq: Two times a day (BID) | ORAL | 0 refills | Status: AC

## 2024-01-13 NOTE — Discharge Instructions (Addendum)
 David Hartman  Thank you for allowing us  to take care of you today.  You came to the Emergency Department today because pain in your right flank and you are worried that you are having progression of your kidney stones.  Here in the emergency department you do have 2 kidney stones in your distal right ureter, it looks like the previous 1 large kidney stone has broken into 2 smaller kidney stones.  Your labs are reassuring.  We spoke with the urologist, and your imaging and labs do not show that you need to have the stone emergently removed currently.  The urologist on-call will reach out to the [facility] urology group to help get you expedited follow-up.  We will prescribe you celecoxib , this is an NSAID pain medication that you will take twice a day for the next 5 days (other NSAID medications are ibuprofen , Motrin , Advil , Aleve, naproxen, etc.).  Please do not take any other NSAIDs while you are on this medication.  Additionally you can use Tylenol  every 4-6 hours as needed for pain control. We will prescribe Flomax , this is a medication that you will take daily for the next 5 days, to help you make more urine to help push out the kidney stone. We will prescribe Zofran , medication to reduce nausea and vomiting, you can use this every 8 hours as needed. We will prescribe tramadol , an opioid pain medication that you can use every 8 hours as needed if you have severe pain that is not controlled by the celecoxib  and Tylenol .   To-Do: 1. Please follow-up with your primary doctor within 1 - 2 weeks / as soon as possible.   Please return to the Emergency Department or call 911 if you experience have worsening of your symptoms, or do not get better, your pain is uncontrolled by these medications, you have persistent nausea/vomiting despite the Zofran , chest pain, shortness of breath, severe or significantly worsening pain, high fever, severe confusion, pass out or have any reason to think that  you need emergency medical care.   We hope you feel better soon.   Department of Emergency Medicine [facility]

## 2024-01-13 NOTE — ED Provider Notes (Signed)
 " [place] EMERGENCY DEPARTMENT AT [facility] Provider Note   CSN: [id number] Arrival date & time: 01/13/24  1621     History Chief Complaint  Patient presents with   Flank Pain    HPI: David Hartman is a 63 y.o. male with history pertinent kidney stones who presents complaining of pain, hematuria, nausea. Patient arrived via POV.  History provided by patient.  No interpreter required during this encounter.  Patient reports that he has a history of nephrolithiasis.  Most recently 2 years ago, however also reports that he had a stone that was 6 mm that required operative intervention 9 years ago.  Reports that he began having right sided abdominal pain similar to his prior stones approximately 2 weeks ago accompanied by hematuria.  Reports that he has seen his primary care doctor, and underwent a CT several days ago that demonstrated nephrolithiasis, as well as cholelithiasis.  Reports that he was given a urgent referral to urology, however has not been able to schedule an appointment with them as of yet, and feels that his pain is intolerable at home.  Expresses concern that since he required operative intervention 9 years ago that he might require operative intervention again today.  Endorses nausea, denies vomiting, denies chest pain, shortness of breath, fever, chills, dysuria, endorses hematuria.  Patient's recorded medical, surgical, social, medication list and allergies were reviewed in the Snapshot window as part of the initial history.   Prior to Admission medications  Medication Sig Start Date End Date Taking? Authorizing Provider  celecoxib  (CELEBREX ) 100 MG capsule Take 1 capsule (100 mg total) by mouth 2 (two) times daily for 5 days. 01/13/24 01/18/24 Yes Rogelia Jerilynn RAMAN, MD  ondansetron  (ZOFRAN -ODT) 4 MG disintegrating tablet Take 1 tablet (4 mg total) by mouth every 8 (eight) hours as needed for nausea or vomiting. 01/13/24  Yes Rogelia Jerilynn RAMAN, MD   tamsulosin  (FLOMAX ) 0.4 MG CAPS capsule Take 1 capsule (0.4 mg total) by mouth daily for 5 days. 01/13/24 01/18/24 Yes Rogelia Jerilynn RAMAN, MD  traMADol  (ULTRAM ) 50 MG tablet Take 1 tablet (50 mg total) by mouth every 8 (eight) hours as needed for up to 5 days. 01/13/24 01/18/24 Yes Rogelia Jerilynn RAMAN, MD  ACCU-CHEK GUIDE TEST test strip USE THREE TIMES DAILY(MORNING, NOON AND AT BEDTIME) 10/05/23   Kayla Jeoffrey RAMAN, FNP  Accu-Chek Softclix Lancets lancets Use to check blood sugars up to four times per day. Dx code E11.9 10/05/23   Duanne Butler DASEN, MD  allopurinol  (ZYLOPRIM ) 300 MG tablet TAKE 1 TABLET(300 MG) BY MOUTH DAILY 08/18/23   Kayla Jeoffrey RAMAN, FNP  ALPRAZolam  (XANAX ) 0.5 MG tablet Take 1 tablet (0.5 mg total) by mouth daily as needed for anxiety. 12/10/23   Kayla Jeoffrey RAMAN, FNP  amLODipine  (NORVASC ) 10 MG tablet Take 1 tablet (10 mg total) by mouth daily. 02/10/23   Santo Stanly LABOR, MD  ascorbic acid (VITAMIN C) 500 MG tablet Take 500 mg by mouth daily.    [provider]  aspirin  EC (ASPIRIN  LOW DOSE) 81 MG tablet Take 1 tablet (81 mg total) by mouth daily. Swallow whole. 06/18/23   Rana Lum CROME, NP  atorvastatin  (LIPITOR) 80 MG tablet TAKE 1 TABLET(80 MG) BY MOUTH DAILY Patient taking differently: Take 80 mg by mouth at bedtime. 06/18/23   Santo Stanly LABOR, MD  Blood Glucose Monitoring Suppl DEVI Use to check blood sugars up to four times per day. May substitute to any manufacturer covered  by patient's insurance. DX code E11.9 10/05/23   Duanne Butler DASEN, MD  ferrous sulfate  325 (65 FE) MG tablet Take 325 mg by mouth daily.    [provider]  lisinopril  (ZESTRIL ) 40 MG tablet TAKE 1 TABLET(40 MG) BY MOUTH DAILY 05/12/23   Duanne Butler DASEN, MD  metFORMIN  (GLUCOPHAGE -XR) 500 MG 24 hr tablet Take 2 tablets (1,000 mg total) by mouth 2 (two) times daily with a meal. Pt takes two tablets twice daily with meals 11/11/23   Kayla Jeoffrey RAMAN, FNP  Multiple Vitamin  (MULTIVITAMIN WITH MINERALS) TABS tablet Take 1 tablet by mouth daily. Men over 50    [provider]  omeprazole  (PRILOSEC  OTC) 20 MG tablet Take 20 mg by mouth in the morning.    [provider]  propranolol  (INDERAL ) 40 MG tablet TAKE 1 TABLET(40 MG) BY MOUTH DAILY 05/12/23   Duanne Butler DASEN, MD  traMADol  (ULTRAM ) 50 MG tablet Take 50 mg by mouth every 6 (six) hours as needed for moderate pain (pain score 4-6). 12/23/23 02/21/24  [provider]     Allergies: Pregabalin, Simvastatin , and Trazodone  and nefazodone   Review of Systems   ROS as per HPI  Physical Exam Updated Vital Signs BP (!) 156/79   Pulse 88   Temp 98.2 F (36.8 C) (Oral)   Resp 18   SpO2 98%  Physical Exam Vitals and nursing note reviewed.  Constitutional:      General: He is not in acute distress.    Appearance: He is well-developed.  HENT:     Head: Normocephalic and atraumatic.  Eyes:     Conjunctiva/sclera: Conjunctivae normal.  Cardiovascular:     Rate and Rhythm: Normal rate and regular rhythm.     Heart sounds: No murmur heard. Pulmonary:     Effort: Pulmonary effort is normal. No respiratory distress.     Breath sounds: Normal breath sounds.  Abdominal:     Palpations: Abdomen is soft.     Tenderness: There is no abdominal tenderness. There is no right CVA tenderness or left CVA tenderness. Negative signs include Murphy's sign.  Musculoskeletal:        General: No swelling.     Cervical back: Neck supple.  Skin:    General: Skin is warm and dry.     Capillary Refill: Capillary refill takes less than 2 seconds.  Neurological:     Mental Status: He is alert.  Psychiatric:        Mood and Affect: Mood normal.     ED Course/ Medical Decision Making/ A&P    Procedures Procedures   Medications Ordered in ED Medications  ketorolac  (TORADOL ) 15 MG/ML injection 15 mg (15 mg Intravenous Given 01/13/24 1843)  lactated ringers  bolus 1,000 mL (1,000 mLs Intravenous  New Bag/Given 01/13/24 1845)  oxyCODONE  (Oxy IR/ROXICODONE ) immediate release tablet 2.5 mg (2.5 mg Oral Given 01/13/24 1857)    Medical Decision Making:   David Hartman is a 63 y.o. male who presents for multiple symptoms as per above.  Physical exam is pertinent for no focal abnormalities.   The differential includes but is not limited to for lithiasis, pyelonephritis, cystitis, hydronephrosis, obstructive uropathy, AKI, electrolyte derangement, uncontrolled pain.  Independent historian: None  External data reviewed: Notes: Reviewed patient's prior operative note from January 2016 with Dr. Alline, patient had distal left ureteral stone that was fragmented with holmium laser fiber  Initial Plan:  Screening labs including CBC and Metabolic panel to evaluate for  infectious or metabolic etiology of disease.  Urinalysis with reflex culture ordered to evaluate for UTI or relevant urologic/nephrologic pathology.  CT stone protocol to evaluate for structural/infectious intra-abdominal pathology.  Objective evaluation as below reviewed   Labs: Ordered, Independent interpretation, and Details: UA with numerous RBCs, rare bacteria, however no nitrites, no LE, not concerning for UTI.  BMP without AKI, emergent electrolyte derangement, CBC with mild nonspecific leukocytosis to 11.2.  No anemia or thrombocytopenia.  Radiology: Ordered, Independent interpretation, Details: Patient reviewed CT of the abdomen pelvis, do appreciate 2 radiodense nephrolithiasis in the distal right ureter, I do appreciate mild upstream hydroureteronephrosis, and All images reviewed independently.  Agree with radiology report at this time.   CT Renal Stone Study Result Date: 01/13/2024 EXAM: CT UROGRAM 01/13/2024 05:49:32 PM TECHNIQUE: CT of the abdomen and pelvis was performed without the administration of intravenous contrast as per CT urogram protocol. Multiplanar reformatted images as well as MIP urogram images are  provided for review. Automated exposure control, iterative reconstruction, and/or weight based adjustment of the mA/kV was utilized to reduce the radiation dose to as low as reasonably achievable. COMPARISON: 01/08/2024 CLINICAL HISTORY: Abdominal/flank pain, stone suspected. FINDINGS: LOWER CHEST: Mild chondromegaly. LIVER: The liver is unremarkable. GALLBLADDER AND BILE DUCTS: Redemonstrated small radiopaque gallstone. No wall thickening. No biliary ductal dilatation. SPLEEN: No acute abnormality. PANCREAS: Mild fatty infiltration of the pancreatic parenchyma. ADRENAL GLANDS: No acute abnormality. KIDNEYS, URETERS AND BLADDER: Worsening moderate right sided hydroureteronephrosis due to an obstructive calculus at the junction of the mid and distal ureter measuring 5 x 3 x 5 mm. A couple of additional calculi are present in the distal right ureter, the largest measures 6 x 3 x 6 mm. A punctate calculus is also present in the distal right ureter. No left hydronephrosis. No perinephric or periureteral stranding. The urinary bladder is almost completely decompressed. Punctate nephrolithiasis remaining in the renal calyces bilaterally. GI AND BOWEL: Stomach demonstrates no acute abnormality. Appendectomy. There is no bowel obstruction. PERITONEUM AND RETROPERITONEUM: No ascites. No free air. No free pelvic fluid. VASCULATURE: Aorta is normal in caliber. Multivessel coronary atherosclerosis. Unchanged peripherally calcified lesion along the left iliac chain. LYMPH NODES: No lymphadenopathy. REPRODUCTIVE ORGANS: Prostatectomy. BONES AND SOFT TISSUES: Advanced multilevel degenerative disc disease of the spine. Chronic L2 compression fracture. Thoracic DISH. No focal soft tissue abnormality. IMPRESSION: 1. Worsening moderate right-sided hydroureteronephrosis due to a few ureteral calculi. At the junction of the mid and distal ureter , there is a 5 x 3 x 5 mm calculus . Just downstream in the distal right ureter, the l there  is a larger 6 x 3 x 6 mm calculus with an adjacent punctate calculus. 2. Cholecystolithiasis. Electronically signed by: Rogelia Myers MD 01/13/2024 06:14 PM EST RP Workstation: [id number]   CT ABDOMEN PELVIS W WO CONTRAST Result Date: 01/09/2024 EXAM: CT ABDOMEN AND PELVIS WITHOUT AND WITH CONTRAST 01/08/2024 08:59:09 AM TECHNIQUE: CT of the abdomen and pelvis was performed without and with the administration of 100 mL of iopamidol  (ISOVUE -300) 61% injection. Multiplanar reformatted images are provided for review. Automated exposure control, iterative reconstruction, and/or weight-based adjustment of the mA/kV was utilized to reduce the radiation dose to as low as reasonably achievable. COMPARISON: CTA of 06/30/2022. CLINICAL HISTORY: Right flank pain, hematuria. Prostatectomy for prostate carcinoma. FINDINGS: LOWER CHEST: Mild cardiomegaly. Right and left circumflex coronary artery calcification. Clear lung bases. LIVER: Mild hepatic steatosis. Moderate caudate lobe prominence, without specific evidence of cirrhosis. No mass or intrahepatic biliary  duct dilatation. GALLBLADDER AND BILE DUCTS: Gallstones up to 1.6 cm without acute cholecystitis. No biliary ductal dilatation. SPLEEN: Normal in size and morphology. PANCREAS: Normal, without duct dilatation or acute inflammation. ADRENAL GLANDS: Normal, without mass. KIDNEYS, URETERS AND BLADDER: Punctate left renal collecting system calculi. Although there is no hydronephrosis or hydroureter, a mid right ureteric stone versus multiple adjacent stones measure in total 11 mm on coronal image 94 / 7 and transverse image 65 / 4. No bladder calculi. No suspicious renal mass on post-contrast imaging. Bilateral too small to characterize renal lesions are most likely cysts and do not warrant specific imaging follow up. Moderate renal collecting system opacification on delayed images. Good right ureteric and suboptimal left ureteric opacification on delayed images. No  collecting system or ureteric filling defect. No enhancing bladder mass or bladder filling defect on delayed images. No perinephric or periureteral stranding. GI AND BOWEL: Normal stomach, without wall thickening. Normal small bowel caliber. Normal colon and terminal ileum. Status post appendectomy. PERITONEUM AND RETROPERITONEUM: No ascites. No free air. VASCULATURE: Aorta is normal in caliber. Abdominal aortic atherosclerosis. LYMPH NODES: No lymphadenopathy. REPRODUCTIVE ORGANS: Prostatectomy, without local recurrence. BONES AND SOFT TISSUES: Lumbosacral spondylosis. Mild L2 wedge deformity is similar to 06/30/2022. A peripherally calcified well-circumscribed non-enhancing lesion anterior to the left external iliac vasculature on image 64 / 11 is similar at 4.0 cm back to at least 03/22/2016. Tiny fat-containing left inguinal hernia. IMPRESSION: 1. Mid right ureteric stone or stones measuring maximally 11 mm. No upstream hydronephrosis. 2. left nephrolithiasis 3. Peripherally calcified cystic lesion anterior to the left external iliac vasculature is present back to at least 218 and can be presumed benign. Possibly a pseudoaneurysm or calcified pharynx. 4. Incidental findings, including: Cholelithiasis. Aortic atherosclerosis (icd10-i70.0). Coronary artery atherosclerosis. Hepatic steatosis. Prostatectomy. Electronically signed by: Rockey Kilts MD 01/09/2024 04:44 PM EST RP Workstation: [id number]    EKG/Medicine tests: Not indicated EKG Interpretation:                  Interventions: Toradol , LR bolus, morphine   See the EMR for full details regarding lab and imaging results.  Patient presents to the emergency department for continued right flank pain.  Has recently been diagnosed with kidney stones, and is concerned because he has not been able to follow-up with with urology, and has ongoing pain.  Imaging are indicated as per initial plan above.  UA does demonstrate hematuria, as well as bacteriuria,  however no evidence of UTI, thus will not treat with antibiotics at this time.  Additionally CBC and BMP reassuring, and no evidence of obstructive AKI.  CT of the abdomen and pelvis does demonstrate that the patient now has 2 distal right ureteral stones, previously had 111 mm stone, now has a 5 mm as well as a 6 mm stone, thus likely the prior stone fragmented.  Patient does have mild increase in upstream hydroureteronephrosis on comparison to prior scan from 12/19, thus I did not discuss the patient's case with Dr. Georganne, urologist on-call.  Given patient has reassuring labs, and does not have severe hydronephrosis, Dr. Georganne does not feel that patient requires emergent stent, which I feel is reasonable, however Dr. Perla will reach out to the [facility] urology group to help expedite the patient's follow-up.  Discussed this with patient at bedside, patient had partial improvement in pain with multimodal pain therapy, patient does have a complicated pain history, and does follow with chronic pain.  Patient reports that he feels that  he receives better relief from tramadol  than oxycodone , thus we will prescribe Ultram  for breakthrough pain, as well as celecoxib , Zofran , Flomax .  Return precautions discussed, patient will follow-up with PCP and urology.  Presentation is most consistent with acute complicated illness, Presentation is most consistent with exacerbation of chronic illness, and I did consider and rule out acute life/limb-threatening illness  Discussion of management or test interpretations with external provider(s): Dr. Georganne, urology  Risk Drugs:OTC drugs and Prescription drug management  Disposition: DISCHARGE: I believe that the patient is safe for discharge home with outpatient follow-up. Patient was informed of all pertinent physical exam, laboratory, and imaging findings. Patient's suspected etiology of their symptom presentation was discussed with the patient and all  questions were answered. We discussed following up with PCP, urology. I provided thorough ED return precautions. The patient feels safe and comfortable with this plan.  MDM generated using voice dictation software and may contain dictation errors.  Please contact me for any clarification or with any questions.  Clinical Impression:  1. Nephrolithiasis      Discharge   Final Clinical Impression(s) / ED Diagnoses Final diagnoses:  Nephrolithiasis    Rx / DC Orders ED Discharge Orders          Ordered    celecoxib  (CELEBREX ) 100 MG capsule  2 times daily        01/13/24 1948    ondansetron  (ZOFRAN -ODT) 4 MG disintegrating tablet  Every 8 hours PRN        01/13/24 1948    traMADol  (ULTRAM ) 50 MG tablet  Every 8 hours PRN        01/13/24 1948    tamsulosin  (FLOMAX ) 0.4 MG CAPS capsule  Daily        01/13/24 1948    Ambulatory referral to Urology        01/13/24 1950             Rogelia Jerilynn RAMAN, MD 01/13/24 2100  "

## 2024-01-13 NOTE — Telephone Encounter (Signed)
 Hi [name], can you check on the urology referral for [name]. Dr. SHAUNNA entered it on the 22nd and it was sent as urgent. Thank you!

## 2024-01-13 NOTE — ED Triage Notes (Signed)
 Pt reports has kidney stones that are known, pt reports pain x 2 weeks but has gotten worse today, pt cannot sit, has pain in flank, nausea, and blood in urine.

## 2024-01-16 ENCOUNTER — Other Ambulatory Visit: Payer: Self-pay | Admitting: Family Medicine

## 2024-01-27 ENCOUNTER — Encounter: Payer: Self-pay | Admitting: Physician Assistant

## 2024-01-27 ENCOUNTER — Other Ambulatory Visit: Payer: Self-pay

## 2024-01-27 ENCOUNTER — Encounter: Payer: Self-pay | Admitting: Family Medicine

## 2024-01-27 ENCOUNTER — Ambulatory Visit: Admitting: Family Medicine

## 2024-01-27 VITALS — BP 132/64 | HR 69 | Temp 97.8°F | Ht 70.0 in | Wt 260.0 lb

## 2024-01-27 DIAGNOSIS — Z860101 Personal history of adenomatous and serrated colon polyps: Secondary | ICD-10-CM | POA: Insufficient documentation

## 2024-01-27 DIAGNOSIS — R319 Hematuria, unspecified: Secondary | ICD-10-CM

## 2024-01-27 DIAGNOSIS — Z862 Personal history of diseases of the blood and blood-forming organs and certain disorders involving the immune mechanism: Secondary | ICD-10-CM | POA: Insufficient documentation

## 2024-01-27 DIAGNOSIS — N200 Calculus of kidney: Secondary | ICD-10-CM

## 2024-01-27 DIAGNOSIS — K760 Fatty (change of) liver, not elsewhere classified: Secondary | ICD-10-CM

## 2024-01-27 DIAGNOSIS — R350 Frequency of micturition: Secondary | ICD-10-CM | POA: Diagnosis not present

## 2024-01-27 DIAGNOSIS — K8020 Calculus of gallbladder without cholecystitis without obstruction: Secondary | ICD-10-CM | POA: Insufficient documentation

## 2024-01-27 DIAGNOSIS — K8080 Other cholelithiasis without obstruction: Secondary | ICD-10-CM

## 2024-01-27 DIAGNOSIS — F3340 Major depressive disorder, recurrent, in remission, unspecified: Secondary | ICD-10-CM | POA: Insufficient documentation

## 2024-01-27 DIAGNOSIS — Z8639 Personal history of other endocrine, nutritional and metabolic disease: Secondary | ICD-10-CM | POA: Insufficient documentation

## 2024-01-27 DIAGNOSIS — I700 Atherosclerosis of aorta: Secondary | ICD-10-CM | POA: Diagnosis not present

## 2024-01-27 DIAGNOSIS — E66812 Obesity, class 2: Secondary | ICD-10-CM | POA: Insufficient documentation

## 2024-01-27 LAB — URINALYSIS, ROUTINE W REFLEX MICROSCOPIC
Bacteria, UA: NONE SEEN /HPF
Bilirubin Urine: NEGATIVE
Glucose, UA: NEGATIVE
Hyaline Cast: NONE SEEN /LPF
Ketones, ur: NEGATIVE
Leukocytes,Ua: NEGATIVE
Nitrite: NEGATIVE
Protein, ur: NEGATIVE
Specific Gravity, Urine: 1.02 (ref 1.001–1.035)
Squamous Epithelial / HPF: NONE SEEN /HPF
WBC, UA: NONE SEEN /HPF (ref 0–5)
pH: 5.5 (ref 5.0–8.0)

## 2024-01-27 LAB — MICROSCOPIC MESSAGE

## 2024-01-27 NOTE — Progress Notes (Unsigned)
ambul;a
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
## 2024-01-31 ENCOUNTER — Other Ambulatory Visit: Payer: Self-pay | Admitting: Family Medicine

## 2024-02-03 ENCOUNTER — Inpatient Hospital Stay (HOSPITAL_COMMUNITY): Admitting: Anesthesiology

## 2024-02-03 ENCOUNTER — Encounter (HOSPITAL_COMMUNITY): Payer: Self-pay | Admitting: Urology

## 2024-02-03 ENCOUNTER — Encounter (HOSPITAL_COMMUNITY)
Admission: RE | Disposition: A | Discharge status: Home / Self Care | Payer: Self-pay | Source: Ambulatory Visit | Attending: Urology

## 2024-02-03 ENCOUNTER — Other Ambulatory Visit: Payer: Self-pay | Admitting: Urology

## 2024-02-03 ENCOUNTER — Other Ambulatory Visit: Payer: Self-pay

## 2024-02-03 ENCOUNTER — Inpatient Hospital Stay (HOSPITAL_COMMUNITY)

## 2024-02-03 ENCOUNTER — Ambulatory Visit (HOSPITAL_COMMUNITY)
Admission: RE | Admit: 2024-02-03 | Discharge: 2024-02-03 | Disposition: A | Discharge status: Home / Self Care | Source: Ambulatory Visit | Attending: Urology | Admitting: Urology

## 2024-02-03 DIAGNOSIS — N202 Calculus of kidney with calculus of ureter: Secondary | ICD-10-CM | POA: Insufficient documentation

## 2024-02-03 DIAGNOSIS — E119 Type 2 diabetes mellitus without complications: Secondary | ICD-10-CM

## 2024-02-03 DIAGNOSIS — Z79899 Other long term (current) drug therapy: Secondary | ICD-10-CM | POA: Diagnosis not present

## 2024-02-03 DIAGNOSIS — Z7984 Long term (current) use of oral hypoglycemic drugs: Secondary | ICD-10-CM | POA: Insufficient documentation

## 2024-02-03 DIAGNOSIS — N201 Calculus of ureter: Secondary | ICD-10-CM | POA: Diagnosis not present

## 2024-02-03 DIAGNOSIS — K219 Gastro-esophageal reflux disease without esophagitis: Secondary | ICD-10-CM | POA: Insufficient documentation

## 2024-02-03 DIAGNOSIS — I10 Essential (primary) hypertension: Secondary | ICD-10-CM | POA: Diagnosis not present

## 2024-02-03 DIAGNOSIS — F418 Other specified anxiety disorders: Secondary | ICD-10-CM | POA: Diagnosis not present

## 2024-02-03 HISTORY — PX: CYSTOSCOPY/URETEROSCOPY/HOLMIUM LASER/STENT PLACEMENT: SHX6546

## 2024-02-03 LAB — GLUCOSE, CAPILLARY
Glucose-Capillary: 100 mg/dL — ABNORMAL HIGH (ref 70–99)
Glucose-Capillary: 90 mg/dL (ref 70–99)
Glucose-Capillary: 94 mg/dL (ref 70–99)

## 2024-02-03 MED ORDER — INSULIN ASPART 100 UNIT/ML IJ SOLN: 0.0000 [IU] | INTRAMUSCULAR | Status: DC | PRN

## 2024-02-03 MED ORDER — PROPOFOL 10 MG/ML IV BOLUS
INTRAVENOUS | Status: DC | PRN
  Administered 2024-02-03: 200 mg via INTRAVENOUS

## 2024-02-03 MED ORDER — FENTANYL CITRATE (PF) 50 MCG/ML IJ SOSY: 25.0000 ug | PREFILLED_SYRINGE | INTRAMUSCULAR | Status: DC | PRN

## 2024-02-03 MED ORDER — FENTANYL CITRATE (PF) 100 MCG/2ML IJ SOLN
INTRAMUSCULAR | Status: AC
  Filled 2024-02-03: qty 2

## 2024-02-03 MED ORDER — OXYCODONE-ACETAMINOPHEN 5-325 MG PO TABS: 1.0000 | ORAL_TABLET | ORAL | 0 refills | Status: DC | PRN

## 2024-02-03 MED ORDER — SODIUM CHLORIDE 0.9 % IR SOLN
Status: DC | PRN
  Administered 2024-02-03: 3000 mL via INTRAVESICAL

## 2024-02-03 MED ORDER — FENTANYL CITRATE (PF) 100 MCG/2ML IJ SOLN
INTRAMUSCULAR | Status: DC | PRN
  Administered 2024-02-03 (×2): 50 ug via INTRAVENOUS

## 2024-02-03 MED ORDER — DEXAMETHASONE SOD PHOSPHATE PF 10 MG/ML IJ SOLN
INTRAMUSCULAR | Status: DC | PRN
  Administered 2024-02-03: 10 mg via INTRAVENOUS

## 2024-02-03 MED ORDER — MIDAZOLAM HCL 2 MG/2ML IJ SOLN
INTRAMUSCULAR | Status: AC
  Filled 2024-02-03: qty 2

## 2024-02-03 MED ORDER — MIDAZOLAM HCL 5 MG/5ML IJ SOLN
INTRAMUSCULAR | Status: DC | PRN
  Administered 2024-02-03: 2 mg via INTRAVENOUS

## 2024-02-03 MED ORDER — CHLORHEXIDINE GLUCONATE 0.12 % MT SOLN
15.0000 mL | Freq: Once | OROMUCOSAL | Status: AC
  Administered 2024-02-03: 15 mL via OROMUCOSAL

## 2024-02-03 MED ORDER — LIDOCAINE 2% (20 MG/ML) 5 ML SYRINGE
INTRAMUSCULAR | Status: DC | PRN
  Administered 2024-02-03: 80 mg via INTRAVENOUS

## 2024-02-03 MED ORDER — PROPOFOL 10 MG/ML IV BOLUS
INTRAVENOUS | Status: AC
  Filled 2024-02-03: qty 20

## 2024-02-03 MED ORDER — OXYCODONE HCL 5 MG/5ML PO SOLN: 5.0000 mg | Freq: Once | ORAL | Status: AC | PRN

## 2024-02-03 MED ORDER — OXYCODONE HCL 5 MG PO TABS
ORAL_TABLET | ORAL | Status: AC
  Filled 2024-02-03: qty 1

## 2024-02-03 MED ORDER — CEFAZOLIN SODIUM-DEXTROSE 2-4 GM/100ML-% IV SOLN
2.0000 g | INTRAVENOUS | Status: AC
  Administered 2024-02-03: 2 g via INTRAVENOUS
  Filled 2024-02-03: qty 100

## 2024-02-03 MED ORDER — AMISULPRIDE (ANTIEMETIC) 5 MG/2ML IV SOLN: 10.0000 mg | Freq: Once | INTRAVENOUS | Status: DC | PRN

## 2024-02-03 MED ORDER — ONDANSETRON HCL 4 MG/2ML IJ SOLN
INTRAMUSCULAR | Status: DC | PRN
  Administered 2024-02-03: 4 mg via INTRAVENOUS

## 2024-02-03 MED ORDER — OXYCODONE HCL 5 MG PO TABS
5.0000 mg | ORAL_TABLET | Freq: Once | ORAL | Status: AC | PRN
  Administered 2024-02-03: 5 mg via ORAL

## 2024-02-03 MED ORDER — ACETAMINOPHEN 500 MG PO TABS
1000.0000 mg | ORAL_TABLET | Freq: Once | ORAL | Status: AC
  Administered 2024-02-03: 1000 mg via ORAL
  Filled 2024-02-03: qty 2

## 2024-02-03 MED ORDER — IOHEXOL 300 MG/ML  SOLN
INTRAMUSCULAR | Status: DC | PRN
  Administered 2024-02-03: 4 mL

## 2024-02-03 MED ORDER — LACTATED RINGERS IV SOLN: INTRAVENOUS | Status: DC

## 2024-02-03 MED ORDER — PHENYLEPHRINE 80 MCG/ML (10ML) SYRINGE FOR IV PUSH (FOR BLOOD PRESSURE SUPPORT)
PREFILLED_SYRINGE | INTRAVENOUS | Status: DC | PRN
  Administered 2024-02-03: 160 ug via INTRAVENOUS

## 2024-02-03 NOTE — Discharge Instructions (Addendum)
 You may see some blood in the urine and may have some burning with urination for 48-72 hours. You also may notice that you have to urinate more frequently or urgently after your procedure which is normal.  You should call should you develop an inability urinate, fever > 101, persistent nausea and vomiting that prevents you from eating or drinking to stay hydrated.  If you have a stent, you will likely urinate more frequently and urgently until the stent is removed and you may experience some discomfort/pain in the lower abdomen and flank especially when urinating. You may take pain medication prescribed to you if needed for pain. You may also intermittently have blood in the urine until the stent is removed. 4.   You may remove your stent on Monday morning.  Simply pull the string that is taped to your body and the stent will easily come out.  This may be best done in the shower as some urine may come out with the stent.  Usually you will feel relief once the stent is removed, but occasionally patients can develop pain due to residual swelling of the ureter that may temporarily obstruct the kidney.  This can be managed by taking pain medication and it will typically resolve with time.  Please do not hesitate to call if you have pain that is not controlled with your pain medication or does not improved within 24-48 hours.

## 2024-02-03 NOTE — Anesthesia Postprocedure Evaluation (Signed)
"   Anesthesia Post Note  Patient: David Hartman  Procedure(s) Performed: CYSTOSCOPY/URETEROSCOPY/HOLMIUM LASER/STENT PLACEMENT (Right)     Patient location during evaluation: PACU Anesthesia Type: General Level of consciousness: awake Pain management: pain level controlled Vital Signs Assessment: post-procedure vital signs reviewed and stable Respiratory status: spontaneous breathing, nonlabored ventilation and respiratory function stable Cardiovascular status: blood pressure returned to baseline and stable Postop Assessment: no apparent nausea or vomiting Anesthetic complications: no   No notable events documented.  Last Vitals:  Vitals:   02/03/24 1730 02/03/24 1800  BP: (!) 142/92 (!) 141/87  Pulse: 60 64  Resp: 19 20  Temp:  36.8 C  SpO2: 94% 97%    Last Pain:  Vitals:   02/03/24 1830  TempSrc:   PainSc: 2                  Brendia Dampier P Aaliah Jorgenson      "

## 2024-02-03 NOTE — Interval H&P Note (Signed)
 History and Physical Interval Note:  02/03/2024 3:18 PM  David Hartman  has presented today for surgery, with the diagnosis of RIGHT URETERAL STONE.  The various methods of treatment have been discussed with the patient and family. After consideration of risks, benefits and other options for treatment, the patient has consented to  Procedures: CYSTOSCOPY/URETEROSCOPY/HOLMIUM LASER/STENT PLACEMENT (Right) as a surgical intervention.  The patient's history has been reviewed, patient examined, no change in status, stable for surgery.  I have reviewed the patient's chart and labs.  Questions were answered to the patient's satisfaction.     [employer]

## 2024-02-03 NOTE — Op Note (Signed)
 Preoperative diagnosis: Right ureteral and renal calculi  Postoperative diagnosis: Right ureteral calculi  Procedure:  Cystoscopy Right ureteroscopy and stone removal Ureteroscopic laser lithotripsy Right ureteral stent placement (6 x 26-no string)  Right retrograde pyelography with interpretation  Surgeon: Gretel CANDIE Renda Mickey. M.D.  Anesthesia: General  Complications: None  Intraoperative findings: Right retrograde pyelography demonstrated a filling defect within the distal right ureter consistent with the patients known calculus without other abnormalities.  EBL: Minimal  Specimens: Right ureteral calculi  Disposition of specimens: [facility] for stone analysis  Indication: David Hartman is a 64 y.o. year old patient with multiple right ureteral calculi status post ESWL. After reviewing the management options for treatment, the patient elected to proceed with the above surgical procedure(s). We have discussed the potential benefits and risks of the procedure, side effects of the proposed treatment, the likelihood of the patient achieving the goals of the procedure, and any potential problems that might occur during the procedure or recuperation. Informed consent has been obtained.  Description of procedure:  The patient was taken to the operating room and general anesthesia was induced.  The patient was placed in the dorsal lithotomy position, prepped and draped in the usual sterile fashion, and preoperative antibiotics were administered. A preoperative time-out was performed.   Cystourethroscopy was performed.  The patients urethra was examined and was normal. The bladder was then systematically examined in its entirety. There was no evidence for any bladder tumors, stones, or other mucosal pathology.    Attention then turned to the right ureteral orifice and a ureteral catheter was used to intubate the ureteral orifice.  Omnipaque  contrast was injected  through the ureteral catheter and a retrograde pyelogram was performed with findings as dictated above.  A 0.38 sensor guidewire was then advanced up the right ureter into the renal pelvis under fluoroscopic guidance. The 6 Fr semirigid ureteroscope was then advanced into the ureter next to the guidewire and the calculus was identified.   The stone was then fragmented with the 365 micron holmium laser fiber on a setting of 0.8 J and frequency of 6 hz.   All stones were then removed from the ureter with a zero tip nitinol basket.  Reinspection of the ureter revealed no remaining visible stones or fragments.   A 12/14 ureteral access sheath was then advanced over the wire up into the proximal ureter which had been previously examined with the ureteroscope and demonstrated no evidence of remaining stone disease.  A flexible digital ureteroscope was then advanced through the ureteral access sheath into the right renal collecting system.  Preoperative imaging had suggested that he may have remaining fragments in the kidney.  A thorough systematic examination of the renal collecting system revealed no further stones.  The ureteroscope was then withdrawn along with the access sheath leaving the wire in place.  The wire was then backloaded through the cystoscope and a ureteral stent was advance over the wire using Seldinger technique.  The stent was positioned appropriately under fluoroscopic and cystoscopic guidance.  The wire was then removed with an adequate stent curl noted in the renal pelvis as well as in the bladder.  The bladder was then emptied and the procedure ended.  The patient appeared to tolerate the procedure well and without complications.  The patient was able to be awakened and transferred to the recovery unit in satisfactory condition.

## 2024-02-03 NOTE — Transfer of Care (Signed)
 Immediate Anesthesia Transfer of Care Note  Patient: David Hartman  Procedure(s) Performed: CYSTOSCOPY/URETEROSCOPY/HOLMIUM LASER/STENT PLACEMENT (Right)  Patient Location: PACU  Anesthesia Type:General  Level of Consciousness: sedated, patient cooperative, and responds to stimulation  Airway & Oxygen Therapy: Patient Spontanous Breathing and Patient connected to face mask oxygen  Post-op Assessment: Report given to RN and Post -op Vital signs reviewed and stable  Post vital signs: Reviewed and stable  Last Vitals:  Vitals Value Taken Time  BP    Temp    Pulse 64 02/03/24 16:54  Resp 15 02/03/24 16:54  SpO2 97 % 02/03/24 16:54  Vitals shown include unfiled device data.  Last Pain:  Vitals:   02/03/24 1351  TempSrc:   PainSc: 8          Complications: No notable events documented.

## 2024-02-03 NOTE — Anesthesia Procedure Notes (Signed)
 Procedure Name: LMA Insertion Date/Time: 02/03/2024 4:04 PM  Performed by: Cindie Charleen PARAS, CRNAPre-anesthesia Checklist: Patient identified, Emergency Drugs available, Suction available, Patient being monitored and Timeout performed Patient Re-evaluated:Patient Re-evaluated prior to induction Oxygen Delivery Method: Circle system utilized Preoxygenation: Pre-oxygenation with 100% oxygen Induction Type: IV induction Ventilation: Mask ventilation without difficulty LMA: LMA inserted LMA Size: 4.0 Number of attempts: 1 Placement Confirmation: positive ETCO2 and breath sounds checked- equal and bilateral Tube secured with: Tape Dental Injury: Teeth and Oropharynx as per pre-operative assessment

## 2024-02-03 NOTE — Anesthesia Preprocedure Evaluation (Addendum)
"                                    Anesthesia Evaluation  Patient identified by MRN, date of birth, ID band Patient awake    Reviewed: Allergy & Precautions, NPO status , Patient's Chart, lab work & pertinent test results  Airway Mallampati: II  TM Distance: >3 FB Neck ROM: Full    Dental no notable dental hx.    Pulmonary neg pulmonary ROS   Pulmonary exam normal        Cardiovascular hypertension, Pt. on medications and Pt. on home beta blockers Normal cardiovascular exam     Neuro/Psych  PSYCHIATRIC DISORDERS Anxiety Depression    negative neurological ROS     GI/Hepatic Neg liver ROS,GERD  Medicated and Controlled,,  Endo/Other  diabetes, Oral Hypoglycemic Agents    Renal/GU Renal disease     Musculoskeletal   Abdominal  (+) + obese  Peds  Hematology negative hematology ROS (+)   Anesthesia Other Findings RIGHT URETERAL STONE  Reproductive/Obstetrics                              Anesthesia Physical Anesthesia Plan  ASA: 3  Anesthesia Plan: General   Post-op Pain Management:    Induction: Intravenous  PONV Risk Score and Plan: 2 and Ondansetron , Dexamethasone , Midazolam  and Treatment may vary due to age or medical condition  Airway Management Planned: LMA  Additional Equipment:   Intra-op Plan:   Post-operative Plan: Extubation in OR  Informed Consent: I have reviewed the patients History and Physical, chart, labs and discussed the procedure including the risks, benefits and alternatives for the proposed anesthesia with the patient or authorized representative who has indicated his/her understanding and acceptance.     Dental advisory given  Plan Discussed with: CRNA  Anesthesia Plan Comments:          Anesthesia Quick Evaluation  "

## 2024-02-04 ENCOUNTER — Encounter (HOSPITAL_COMMUNITY): Payer: Self-pay | Admitting: Urology

## 2024-02-09 ENCOUNTER — Other Ambulatory Visit: Payer: Self-pay | Admitting: Internal Medicine

## 2024-02-11 ENCOUNTER — Other Ambulatory Visit: Payer: Self-pay | Admitting: Family Medicine

## 2024-02-11 MED ORDER — AMLODIPINE BESYLATE 10 MG PO TABS: 10.0000 mg | ORAL_TABLET | Freq: Every day | ORAL | 3 refills | Status: AC

## 2024-02-15 ENCOUNTER — Other Ambulatory Visit: Payer: Self-pay | Admitting: Family Medicine

## 2024-02-18 ENCOUNTER — Other Ambulatory Visit: Payer: Self-pay | Admitting: Family Medicine

## 2024-02-19 NOTE — Telephone Encounter (Signed)
 Sent a message to office referral coordinator for update as referral states pt. Is ready for scheduling over 7 days ago. Awaiting on response.

## 2024-02-22 ENCOUNTER — Ambulatory Visit: Admitting: Physician Assistant

## 2024-02-24 ENCOUNTER — Encounter (HOSPITAL_COMMUNITY): Payer: Self-pay | Admitting: Urology

## 2024-02-24 ENCOUNTER — Encounter: Payer: Self-pay | Admitting: Urology

## 2024-02-24 ENCOUNTER — Other Ambulatory Visit: Payer: Self-pay | Admitting: Urology

## 2024-02-24 NOTE — Progress Notes (Signed)
 For Anesthesia: PCP - Jeoffrey GORMAN Barrio, FNP  Cardiologist - Stanly DELENA Leavens, MD Last office visit note by Lum LITTIE Louis, NP 01/05/24 in [facility]  Bowel Prep reminder:  Chest x-ray - greater than 1 year EKG - 01/05/24 in [facility] Stress Test - 08/06/16 IN [facility] ECHO - 07/01/22 IN [facility] Cardiac Cath -  Pacemaker/ICD device last checked: Pacemaker orders received: Device Rep notified:  Spinal Cord Stimulator:  Sleep Study - 10/07/22 IN [facility] CPAP -   Fasting Blood Sugar -  Checks Blood Sugar _____ times a day Date and result of last Hgb A1c-  Last dose of GLP1 agonist-  GLP1 instructions: Hold 7 days prior to schedule (Hold 24 hours-daily)   Last dose of SGLT-2 inhibitors-  SGLT-2 instructions: Hold 72 hours prior to surgery  Blood Thinner Instructions: Last Dose: Time last taken:  Aspirin  Instructions: Last Dose: Time last taken:  Activity level: Can go up a flight of stairs and activities of daily living without stopping and without chest pain and/or shortness of breath   Able to exercise without chest pain and/or shortness of breath   Unable to go up a flight of stairs without chest pain and/or shortness of breath     Anesthesia review: CAD,  Patient denies shortness of breath, fever, cough and chest pain at PAT appointment   Patient verbalized understanding of instructions that were reviewed over the telephone.

## 2024-02-25 ENCOUNTER — Encounter (HOSPITAL_COMMUNITY): Payer: Self-pay | Admitting: Urology

## 2024-02-25 ENCOUNTER — Other Ambulatory Visit: Payer: Self-pay

## 2024-02-25 NOTE — Anesthesia Preprocedure Evaluation (Signed)
"                                    Anesthesia Evaluation  Patient identified by MRN, date of birth, ID band Patient awake    Reviewed: Allergy & Precautions, NPO status , Patient's Chart, lab work & pertinent test results  Airway Mallampati: III  TM Distance: >3 FB Neck ROM: Full    Dental  (+) Teeth Intact, Dental Advisory Given   Pulmonary sleep apnea    breath sounds clear to auscultation       Cardiovascular hypertension, Pt. on medications and Pt. on home beta blockers + CAD   Rhythm:Regular Rate:Normal     Neuro/Psych  PSYCHIATRIC DISORDERS Anxiety Depression    negative neurological ROS     GI/Hepatic Neg liver ROS,GERD  Medicated,,  Endo/Other  diabetes, Type 2, Oral Hypoglycemic Agents    Renal/GU Renal disease     Musculoskeletal  (+) Arthritis ,    Abdominal   Peds  Hematology negative hematology ROS (+)   Anesthesia Other Findings   Reproductive/Obstetrics                              Anesthesia Physical Anesthesia Plan  ASA: 2  Anesthesia Plan: General   Post-op Pain Management: Tylenol  PO (pre-op)*   Induction: Intravenous  PONV Risk Score and Plan: 3 and Ondansetron , Dexamethasone  and Midazolam   Airway Management Planned: LMA  Additional Equipment: None  Intra-op Plan:   Post-operative Plan: Extubation in OR  Informed Consent: I have reviewed the patients History and Physical, chart, labs and discussed the procedure including the risks, benefits and alternatives for the proposed anesthesia with the patient or authorized representative who has indicated his/her understanding and acceptance.     Dental advisory given  Plan Discussed with: CRNA  Anesthesia Plan Comments:          Anesthesia Quick Evaluation  "

## 2024-02-25 NOTE — Progress Notes (Signed)
 PT is a Same Day  Called pt on 02/25/2024 and updated med hx and completed preop instructionis with pt.  PT voiced understanding.

## 2024-02-26 ENCOUNTER — Encounter (HOSPITAL_COMMUNITY): Payer: Self-pay | Admitting: Urology

## 2024-02-26 ENCOUNTER — Ambulatory Visit (HOSPITAL_COMMUNITY)

## 2024-02-26 ENCOUNTER — Ambulatory Visit (HOSPITAL_COMMUNITY)
Admission: RE | Admit: 2024-02-26 | Discharge: 2024-02-26 | Disposition: A | Source: Home / Self Care | Attending: Urology | Admitting: Urology

## 2024-02-26 ENCOUNTER — Encounter (HOSPITAL_COMMUNITY): Admitting: Anesthesiology

## 2024-02-26 ENCOUNTER — Encounter (HOSPITAL_COMMUNITY): Admission: RE | Disposition: A | Payer: Self-pay | Source: Home / Self Care | Attending: Urology

## 2024-02-26 DIAGNOSIS — Z01818 Encounter for other preprocedural examination: Secondary | ICD-10-CM

## 2024-02-26 HISTORY — DX: Atrioventricular block, first degree: I44.0

## 2024-02-26 HISTORY — DX: Bradycardia, unspecified: R00.1

## 2024-02-26 HISTORY — DX: Atherosclerotic heart disease of native coronary artery without angina pectoris: I25.10

## 2024-02-26 HISTORY — DX: Obstructive sleep apnea (adult) (pediatric): G47.33

## 2024-02-26 HISTORY — DX: Fatty (change of) liver, not elsewhere classified: K76.0

## 2024-02-26 LAB — GLUCOSE, CAPILLARY
Glucose-Capillary: 110 mg/dL — ABNORMAL HIGH (ref 70–99)
Glucose-Capillary: 122 mg/dL — ABNORMAL HIGH (ref 70–99)

## 2024-02-26 LAB — CBC
HCT: 39.3 % (ref 39.0–52.0)
Hemoglobin: 12.7 g/dL — ABNORMAL LOW (ref 13.0–17.0)
MCH: 30.2 pg (ref 26.0–34.0)
MCHC: 32.3 g/dL (ref 30.0–36.0)
MCV: 93.6 fL (ref 80.0–100.0)
Platelets: 188 10*3/uL (ref 150–400)
RBC: 4.2 MIL/uL — ABNORMAL LOW (ref 4.22–5.81)
RDW: 12.9 % (ref 11.5–15.5)
WBC: 6 10*3/uL (ref 4.0–10.5)
nRBC: 0 % (ref 0.0–0.2)

## 2024-02-26 LAB — BASIC METABOLIC PANEL WITH GFR
Anion gap: 12 (ref 5–15)
BUN: 21 mg/dL (ref 8–23)
CO2: 23 mmol/L (ref 22–32)
Calcium: 9.4 mg/dL (ref 8.9–10.3)
Chloride: 105 mmol/L (ref 98–111)
Creatinine, Ser: 1.03 mg/dL (ref 0.61–1.24)
GFR, Estimated: >60 mL/min
Glucose, Bld: 113 mg/dL — ABNORMAL HIGH (ref 70–99)
Potassium: 4.4 mmol/L (ref 3.5–5.1)
Sodium: 140 mmol/L (ref 135–145)

## 2024-02-26 LAB — HEMOGLOBIN A1C
Hgb A1c MFr Bld: 5.8 % — ABNORMAL HIGH (ref 4.8–5.6)
Mean Plasma Glucose: 119.76 mg/dL

## 2024-02-26 MED ORDER — OXYCODONE HCL 5 MG/5ML PO SOLN: 5.0000 mg | Freq: Once | ORAL | Status: AC | PRN

## 2024-02-26 MED ORDER — DROPERIDOL 2.5 MG/ML IJ SOLN
0.6250 mg | Freq: Once | INTRAMUSCULAR | Status: AC | PRN
  Administered 2024-02-26: 0.625 mg via INTRAVENOUS

## 2024-02-26 MED ORDER — OXYCODONE HCL 5 MG PO TABS
5.0000 mg | ORAL_TABLET | Freq: Once | ORAL | Status: AC | PRN
  Administered 2024-02-26: 5 mg via ORAL

## 2024-02-26 MED ORDER — ORAL CARE MOUTH RINSE: 15.0000 mL | Freq: Once | OROMUCOSAL | Status: AC

## 2024-02-26 MED ORDER — MIDAZOLAM HCL 2 MG/2ML IJ SOLN
INTRAMUSCULAR | Status: AC
  Filled 2024-02-26: qty 2

## 2024-02-26 MED ORDER — TRAMADOL HCL 50 MG PO TABS: 50.0000 mg | ORAL_TABLET | Freq: Four times a day (QID) | ORAL | 0 refills | Status: AC | PRN

## 2024-02-26 MED ORDER — DEXAMETHASONE SOD PHOSPHATE PF 10 MG/ML IJ SOLN
INTRAMUSCULAR | Status: AC
  Filled 2024-02-26: qty 1

## 2024-02-26 MED ORDER — LIDOCAINE HCL (PF) 2 % IJ SOLN
INTRAMUSCULAR | Status: DC | PRN
  Administered 2024-02-26: 100 mg via INTRADERMAL

## 2024-02-26 MED ORDER — FENTANYL CITRATE (PF) 100 MCG/2ML IJ SOLN
INTRAMUSCULAR | Status: AC
  Filled 2024-02-26: qty 2

## 2024-02-26 MED ORDER — SODIUM CHLORIDE 0.9 % IR SOLN
Status: DC | PRN
  Administered 2024-02-26: 3000 mL via INTRAVESICAL

## 2024-02-26 MED ORDER — DEXAMETHASONE SOD PHOSPHATE PF 10 MG/ML IJ SOLN
INTRAMUSCULAR | Status: DC | PRN
  Administered 2024-02-26: 10 mg via INTRAVENOUS

## 2024-02-26 MED ORDER — CEFAZOLIN SODIUM-DEXTROSE 2-4 GM/100ML-% IV SOLN
2.0000 g | INTRAVENOUS | Status: AC
  Administered 2024-02-26: 2 g via INTRAVENOUS
  Filled 2024-02-26: qty 100

## 2024-02-26 MED ORDER — FENTANYL CITRATE (PF) 100 MCG/2ML IJ SOLN
INTRAMUSCULAR | Status: DC | PRN
  Administered 2024-02-26 (×2): 50 ug via INTRAVENOUS

## 2024-02-26 MED ORDER — OXYCODONE HCL 5 MG PO TABS
ORAL_TABLET | ORAL | Status: AC
  Filled 2024-02-26: qty 1

## 2024-02-26 MED ORDER — CHLORHEXIDINE GLUCONATE 0.12 % MT SOLN
15.0000 mL | Freq: Once | OROMUCOSAL | Status: AC
  Administered 2024-02-26: 15 mL via OROMUCOSAL

## 2024-02-26 MED ORDER — ACETAMINOPHEN 10 MG/ML IV SOLN: 1000.0000 mg | Freq: Once | INTRAVENOUS | Status: DC | PRN

## 2024-02-26 MED ORDER — PROPOFOL 10 MG/ML IV BOLUS
INTRAVENOUS | Status: DC | PRN
  Administered 2024-02-26: 150 mg via INTRAVENOUS

## 2024-02-26 MED ORDER — ONDANSETRON HCL 4 MG/2ML IJ SOLN
INTRAMUSCULAR | Status: AC
  Filled 2024-02-26: qty 2

## 2024-02-26 MED ORDER — STERILE WATER FOR IRRIGATION IR SOLN
Status: DC | PRN
  Administered 2024-02-26: 3000 mL

## 2024-02-26 MED ORDER — IOHEXOL 300 MG/ML  SOLN
INTRAMUSCULAR | Status: DC | PRN
  Administered 2024-02-26: 5 mL via URETHRAL

## 2024-02-26 MED ORDER — TRANEXAMIC ACID-NACL 1000-0.7 MG/100ML-% IV SOLN
INTRAVENOUS | Status: AC
  Filled 2024-02-26: qty 100

## 2024-02-26 MED ORDER — MIDAZOLAM HCL 5 MG/5ML IJ SOLN
INTRAMUSCULAR | Status: DC | PRN
  Administered 2024-02-26: 2 mg via INTRAVENOUS

## 2024-02-26 MED ORDER — ONDANSETRON HCL 4 MG/2ML IJ SOLN
INTRAMUSCULAR | Status: DC | PRN
  Administered 2024-02-26: 4 mg via INTRAVENOUS

## 2024-02-26 MED ORDER — FENTANYL CITRATE (PF) 50 MCG/ML IJ SOSY
25.0000 ug | PREFILLED_SYRINGE | INTRAMUSCULAR | Status: DC | PRN
  Administered 2024-02-26: 50 ug via INTRAVENOUS

## 2024-02-26 MED ORDER — DROPERIDOL 2.5 MG/ML IJ SOLN
INTRAMUSCULAR | Status: AC
  Filled 2024-02-26: qty 2

## 2024-02-26 MED ORDER — LIDOCAINE HCL (PF) 2 % IJ SOLN
INTRAMUSCULAR | Status: AC
  Filled 2024-02-26: qty 5

## 2024-02-26 MED ORDER — LACTATED RINGERS IV SOLN: INTRAVENOUS | Status: DC

## 2024-02-26 MED ORDER — TRANEXAMIC ACID-NACL 1000-0.7 MG/100ML-% IV SOLN
INTRAVENOUS | Status: DC | PRN
  Administered 2024-02-26: 1000 mg via INTRAVENOUS

## 2024-02-26 MED ORDER — ACETAMINOPHEN 500 MG PO TABS
1000.0000 mg | ORAL_TABLET | Freq: Once | ORAL | Status: AC
  Administered 2024-02-26: 1000 mg via ORAL
  Filled 2024-02-26: qty 2

## 2024-02-26 MED ORDER — FENTANYL CITRATE (PF) 50 MCG/ML IJ SOSY
PREFILLED_SYRINGE | INTRAMUSCULAR | Status: AC
  Filled 2024-02-26: qty 1

## 2024-02-26 NOTE — Interval H&P Note (Signed)
 History and Physical Interval Note:  he continues to bleed and have pain  02/26/2024 7:24 AM  David Hartman  has presented today for surgery, with the diagnosis of GROSS HEMATURIA.  The various methods of treatment have been discussed with the patient and family. After consideration of risks, benefits and other options for treatment, the patient has consented to  Procedures: CYSTOSCOPY (N/A) CYSTOSCOPY, WITH RETROGRADE PYELOGRAM (N/A) URETEROSCOPY (Bilateral) CYSTOSCOPY, FLEXIBLE, WITH STENT REPLACEMENT (Bilateral) as a surgical intervention.  The patient's history has been reviewed, patient examined, no change in status, stable for surgery.  I have reviewed the patient's chart and labs.  Questions were answered to the patient's satisfaction.     Manjinder Breau

## 2024-02-26 NOTE — Anesthesia Postprocedure Evaluation (Signed)
"   Anesthesia Post Note  Patient: David Hartman  Procedure(s) Performed: CYSTOSCOPY CYSTOSCOPY, WITH RETROGRADE PYELOGRAM URETEROSCOPY (Bilateral) CYSTOSCOPY, FLEXIBLE, WITH STENT REPLACEMENT (Bilateral: Bladder)     Patient location during evaluation: PACU Anesthesia Type: General Level of consciousness: awake and alert Pain management: pain level controlled Vital Signs Assessment: post-procedure vital signs reviewed and stable Respiratory status: spontaneous breathing, nonlabored ventilation, respiratory function stable and patient connected to nasal cannula oxygen Cardiovascular status: blood pressure returned to baseline and stable Postop Assessment: no apparent nausea or vomiting Anesthetic complications: no   No notable events documented.  Last Vitals:  Vitals:   02/26/24 0945 02/26/24 0958  BP: 104/73 115/71  Pulse: 64 64  Resp: 12 18  Temp:    SpO2: 93% 95%    Last Pain:  Vitals:   02/26/24 0958  TempSrc:   PainSc: 2                  David Hartman      "

## 2024-02-26 NOTE — Op Note (Signed)
 Procedure: 1.  Cystoscopy with right retrograde pyelogram and interpretation. 2.  Right ureteroscopy with fulguration. 3.  Cystoscopy with right ureteral stent insertion. 4.  Application of fluoroscopy.  Pre-Op diagnosis: Gross hematuria.  Postop diagnosis: Same from distal ureteral bleeding.  Surgeon: Dr. Norleen Seltzer.  Anesthesia: General.  Specimen: None.  Drains: 6 French by 26 cm right Contour double-J stent without tether.  EBL: None.  Complications: None.  Indications: The patient is a 64 year old male whoHas a history of a right distal ureteral stone that was managed with ureteroscopy on 02/03/2024.  The stent was removed following the procedure on the 21st but has had persistent gross hematuria with right flank pain since that time.  It was felt that reendoscopy was indicated after a CT showed some mild proximal dilation on the right and irregularity of the right distal ureter.  Procedure: He was taken the operating room where he was given 2 g of Ancef .  A general anesthetic was induced.  He was placed in lithotomy position and food PSIs.  His perineum and genitalia were prepped with Betadine solution he was draped in usual sterile fashion.  Cystoscopy was performed using the 21 French scope and 30 degree lens.  Examination revealed a normal urethra.  The external sphincter was intact.  The prostatic urethra short without significant obstruction.  Examination of bladder revealed mild trabeculation without tumors or stones.  The urine in the bladder was clear.  The left ureteral orifice was unremarkable and effluxed clear urine.  The right ureteral orifice had some edema and I did not observe urine efflux from that orifice.  A 5 French open-ended catheter was then used to perform a right retrograde pyelogram with Omnipaque .  The right retrograde pyelogram demonstrated narrowing of the distal ureter without filling defects with mild proximal dilation to the collecting  system.  After completion of the retrograde pyelogram he began to have bleeding from the ureteral orifice.  A sensor wire was then advanced to the kidney and a 6.5 French semirigid ureteroscope was then advanced alongside the wire.  I was able to easily advance the scope into the ureter.  There was some inflammatory changes at the meatus and just proximally consistent with postprocedural changes but there were small areas of bleeding on the right left and anterior wall of the intramural ureter that appeared to be the source of the ongoing bleeding.  The more proximal ureter while slightly dilated was otherwise unremarkable.  No stone fragments were identified.  After completion of a thorough inspection, irrigation was changed to sterile water  and a small Bugbee electrode was passed.  Initially I passed the 5 French open-ended catheter over the wire to try to provide some insulation but this proved impractical as it prevented passage of the ureteroscope so the open-ended catheter was removed.  There were areas of bleeding were then gently fulgurated and once hemostasis was achieved ureteroscope was removed.  The cystoscope was reinserted over the wire and a 6 French by 26 cm contour double-J stent was passed without difficulty to the kidney under fluoroscopic guidance.  The wire was removed, good coil in the kidney and a good coil in the bladder.  The bladder was observed and no significant residual bleeding was noted.  The bladder was drained and the cystoscope was removed.  He was taken down from the lithotomy position and his anesthetic was reversed.  He was given 1 g of TXA to help with hemostasis.  He was moved recovery in stable  condition.

## 2024-02-26 NOTE — Anesthesia Procedure Notes (Signed)
 Procedure Name: LMA Insertion Date/Time: 02/26/2024 7:40 AM  Performed by: Carleton Garnette SAUNDERS, CRNAPre-anesthesia Checklist: Patient identified, Emergency Drugs available, Suction available, Patient being monitored and Timeout performed Patient Re-evaluated:Patient Re-evaluated prior to induction Oxygen Delivery Method: Circle system utilized Preoxygenation: Pre-oxygenation with 100% oxygen Induction Type: IV induction Ventilation: Mask ventilation without difficulty LMA: LMA inserted LMA Size: 4.0 Tube type: Oral Number of attempts: 1 Placement Confirmation: positive ETCO2 and breath sounds checked- equal and bilateral Tube secured with: Tape Dental Injury: Teeth and Oropharynx as per pre-operative assessment

## 2024-02-26 NOTE — Transfer of Care (Signed)
 Immediate Anesthesia Transfer of Care Note  Patient: David Hartman  Procedure(s) Performed: CYSTOSCOPY CYSTOSCOPY, WITH RETROGRADE PYELOGRAM URETEROSCOPY (Bilateral) CYSTOSCOPY, FLEXIBLE, WITH STENT REPLACEMENT (Bilateral: Bladder)  Patient Location: PACU  Anesthesia Type:General  Level of Consciousness: sedated  Airway & Oxygen Therapy: Patient Spontanous Breathing and Patient connected to face mask oxygen  Post-op Assessment: Report given to RN and Post -op Vital signs reviewed and stable  Post vital signs: Reviewed and stable  Last Vitals:  Vitals Value Taken Time  BP 120/72 02/26/24 08:18  Temp    Pulse 62 02/26/24 08:23  Resp 11 02/26/24 08:23  SpO2 100 % 02/26/24 08:23  Vitals shown include unfiled device data.  Last Pain:  Vitals:   02/26/24 0603  TempSrc: Oral  PainSc: 2          Complications: No notable events documented.

## 2024-03-23 ENCOUNTER — Other Ambulatory Visit

## 2024-03-30 ENCOUNTER — Encounter: Admitting: Family Medicine

## 2024-05-16 ENCOUNTER — Other Ambulatory Visit

## 2024-05-25 ENCOUNTER — Encounter: Admitting: Family Medicine
